# Patient Record
Sex: Female | Born: 1955
Health system: Southern US, Community
[De-identification: ages and names within clinical notes are randomized; demographics above are authoritative.]

## PROBLEM LIST (undated history)

## (undated) DIAGNOSIS — E7800 Pure hypercholesterolemia, unspecified: Secondary | ICD-10-CM

## (undated) DIAGNOSIS — N186 End stage renal disease: Secondary | ICD-10-CM

## (undated) DIAGNOSIS — I4891 Unspecified atrial fibrillation: Secondary | ICD-10-CM

## (undated) DIAGNOSIS — Z9114 Patient's other noncompliance with medication regimen: Secondary | ICD-10-CM

## (undated) DIAGNOSIS — I10 Essential (primary) hypertension: Secondary | ICD-10-CM

## (undated) DIAGNOSIS — N189 Chronic kidney disease, unspecified: Secondary | ICD-10-CM

## (undated) DIAGNOSIS — Z91148 Patient's other noncompliance with medication regimen for other reason: Secondary | ICD-10-CM

## (undated) HISTORY — DX: Chronic kidney disease, unspecified: N18.9

---

## 2000-01-25 ENCOUNTER — Emergency Department (HOSPITAL_COMMUNITY): Admission: EM | Admit: 2000-01-25 | Discharge: 2000-01-25 | Payer: Self-pay | Admitting: *Deleted

## 2000-01-25 ENCOUNTER — Encounter: Payer: Self-pay | Admitting: Emergency Medicine

## 2000-10-11 ENCOUNTER — Encounter: Payer: Self-pay | Admitting: Emergency Medicine

## 2000-10-11 ENCOUNTER — Emergency Department (HOSPITAL_COMMUNITY): Admission: EM | Admit: 2000-10-11 | Discharge: 2000-10-11 | Payer: Self-pay | Admitting: Emergency Medicine

## 2001-04-30 ENCOUNTER — Ambulatory Visit (HOSPITAL_COMMUNITY): Admission: RE | Admit: 2001-04-30 | Discharge: 2001-04-30 | Payer: Self-pay | Admitting: Internal Medicine

## 2001-04-30 ENCOUNTER — Encounter: Payer: Self-pay | Admitting: Internal Medicine

## 2009-08-02 ENCOUNTER — Emergency Department (HOSPITAL_COMMUNITY): Admission: EM | Admit: 2009-08-02 | Discharge: 2009-08-02 | Payer: Self-pay | Admitting: Emergency Medicine

## 2011-01-15 ENCOUNTER — Encounter: Payer: Self-pay | Admitting: Internal Medicine

## 2011-04-01 LAB — COMPREHENSIVE METABOLIC PANEL
ALT: 20 U/L (ref 0–35)
AST: 22 U/L (ref 0–37)
CO2: 26 mEq/L (ref 19–32)
Chloride: 109 mEq/L (ref 96–112)
Creatinine, Ser: 0.92 mg/dL (ref 0.4–1.2)
GFR calc Af Amer: >60 mL/min (ref >60)
GFR calc non Af Amer: >60 mL/min (ref >60)
Glucose, Bld: 96 mg/dL (ref 70–99)
Total Bilirubin: 0.6 mg/dL (ref 0.3–1.2)

## 2011-04-01 LAB — URINE MICROSCOPIC-ADD ON

## 2011-04-01 LAB — URINALYSIS, ROUTINE W REFLEX MICROSCOPIC
Bilirubin Urine: NEGATIVE
Glucose, UA: NEGATIVE mg/dL
Ketones, ur: NEGATIVE mg/dL
Leukocytes, UA: NEGATIVE
Nitrite: NEGATIVE
Protein, ur: 30 mg/dL — AB
Specific Gravity, Urine: 1.01 (ref 1.005–1.030)
Urobilinogen, UA: 0.2 mg/dL (ref 0.0–1.0)
pH: 7 (ref 5.0–8.0)

## 2011-04-01 LAB — CBC
Hemoglobin: 12.8 g/dL (ref 12.0–15.0)
MCV: 86.2 fL (ref 78.0–100.0)
RBC: 4.43 MIL/uL (ref 3.87–5.11)
WBC: 10.9 10*3/uL — ABNORMAL HIGH (ref 4.0–10.5)

## 2011-04-01 LAB — DIFFERENTIAL
Basophils Absolute: 0 10*3/uL (ref 0.0–0.1)
Eosinophils Absolute: 0.3 10*3/uL (ref 0.0–0.7)
Eosinophils Relative: 3 % (ref 0–5)
Lymphocytes Relative: 32 % (ref 12–46)
Neutrophils Relative %: 60 % (ref 43–77)

## 2013-06-05 ENCOUNTER — Encounter (HOSPITAL_COMMUNITY): Payer: Self-pay | Admitting: Emergency Medicine

## 2013-06-05 ENCOUNTER — Emergency Department (HOSPITAL_COMMUNITY)
Admission: EM | Admit: 2013-06-05 | Discharge: 2013-06-06 | Disposition: A | Discharge status: Home / Self Care | Payer: 59 | Attending: Emergency Medicine | Admitting: Emergency Medicine

## 2013-06-05 ENCOUNTER — Emergency Department (HOSPITAL_COMMUNITY): Payer: 59

## 2013-06-05 DIAGNOSIS — R0789 Other chest pain: Secondary | ICD-10-CM

## 2013-06-05 DIAGNOSIS — I10 Essential (primary) hypertension: Secondary | ICD-10-CM | POA: Insufficient documentation

## 2013-06-05 DIAGNOSIS — R071 Chest pain on breathing: Secondary | ICD-10-CM | POA: Insufficient documentation

## 2013-06-05 DIAGNOSIS — R609 Edema, unspecified: Secondary | ICD-10-CM | POA: Insufficient documentation

## 2013-06-05 DIAGNOSIS — Z862 Personal history of diseases of the blood and blood-forming organs and certain disorders involving the immune mechanism: Secondary | ICD-10-CM | POA: Insufficient documentation

## 2013-06-05 DIAGNOSIS — Z8639 Personal history of other endocrine, nutritional and metabolic disease: Secondary | ICD-10-CM | POA: Insufficient documentation

## 2013-06-05 DIAGNOSIS — R55 Syncope and collapse: Secondary | ICD-10-CM | POA: Insufficient documentation

## 2013-06-05 HISTORY — DX: Essential (primary) hypertension: I10

## 2013-06-05 HISTORY — DX: Pure hypercholesterolemia, unspecified: E78.00

## 2013-06-05 LAB — CBC WITH DIFFERENTIAL/PLATELET
Basophils Absolute: 0 10*3/uL (ref 0.0–0.1)
Lymphocytes Relative: 37 % (ref 12–46)
Neutro Abs: 5.1 10*3/uL (ref 1.7–7.7)
Platelets: 247 10*3/uL (ref 150–400)
RDW: 14.1 % (ref 11.5–15.5)
WBC: 9.7 10*3/uL (ref 4.0–10.5)

## 2013-06-05 LAB — BASIC METABOLIC PANEL
CO2: 29 mEq/L (ref 19–32)
Chloride: 106 mEq/L (ref 96–112)
Potassium: 3.7 mEq/L (ref 3.5–5.1)
Sodium: 143 mEq/L (ref 135–145)

## 2013-06-05 LAB — TROPONIN I: Troponin I: <0.3 ng/mL (ref <0.30)

## 2013-06-05 MED ORDER — ASPIRIN 81 MG PO CHEW
324.0000 mg | CHEWABLE_TABLET | Freq: Once | ORAL | Status: AC
  Administered 2013-06-05: 324 mg via ORAL
  Filled 2013-06-05: qty 4

## 2013-06-05 MED ORDER — HYDROCHLOROTHIAZIDE 12.5 MG PO TABS: 12.5000 mg | ORAL_TABLET | Freq: Every day | ORAL | Status: DC

## 2013-06-05 MED ORDER — CARBAMAZEPINE 200 MG PO TABS: 400.0000 mg | ORAL_TABLET | Freq: Once | ORAL | Status: DC

## 2013-06-05 MED ORDER — NAPROXEN 500 MG PO TABS: 500.0000 mg | ORAL_TABLET | Freq: Two times a day (BID) | ORAL | Status: DC

## 2013-06-05 MED ORDER — NAPROXEN 250 MG PO TABS
500.0000 mg | ORAL_TABLET | Freq: Once | ORAL | Status: AC
  Administered 2013-06-05: 500 mg via ORAL
  Filled 2013-06-05: qty 2

## 2013-06-05 MED ORDER — LORAZEPAM 1 MG PO TABS: 2.0000 mg | ORAL_TABLET | Freq: Once | ORAL | Status: DC

## 2013-06-05 NOTE — ED Provider Notes (Signed)
History    This chart was scribed for Patricia Fuel, MD by Patricia Wu, ED scribe.  This patient was seen in room APA14/[facility]14 and the patient's care was started at 11:01 PM.   CSN: [id number]  Arrival date & time 06/05/13  2058       Chief Complaint  Patient presents with  . Chest Pain  . Near Syncope     The history is provided by the patient. No language interpreter was used.    HPI Comments: Patricia Wu is a 57 y.o. female with h/o HTN and hypercholesteremia who presents to the Emergency Department complaining of intermittent, left-sided, throbbing CP that began yesterday.  Pain is rated at a severity of 8/10 and occurs in episodes lasting about 5-10 minutes, and radiates intermittently into the right arm.  Pain comes on without apparent cause and is not worsened by anything.  She states that it is improved by walking.  She states that she took 2 Advil yesterday, with some relief.  She also took 1 aspirin yesterday.  She has not taken aspirin today.  Presently she denies pain and states her last episode was 4 hours ago.  She denies fever, chills, cough, diaphoresis, SOB, nausea, emesis, syncope, or any other associated symptoms.  She denies h/o similar symptoms.  Pt denies any other medical conditions except as listed above.  She does not smoke.  She denies family h/o heart problems although she notes that her 59 year old brother recently had a stroke.  Pt notes that she was taking prednival for her BP but stopped because it made her sick.     Past Medical History  Diagnosis Date  . Hypertension   . Hypercholesteremia     History reviewed. No pertinent past surgical history.  History reviewed. No pertinent family history.  History  Substance Use Topics  . Smoking status: Never Smoker   . Smokeless tobacco: Not on file  . Alcohol Use: No    OB History   Grav Para Term Preterm Abortions TAB SAB Ect Mult Living                  Review of Systems  All other  systems reviewed and are negative.    Allergies  Review of patient's allergies indicates no known allergies.  Home Medications  No current outpatient prescriptions on file.  BP 190/103  Pulse 90  Temp(Src) 98 F (36.7 C) (Oral)  Resp 16  Ht 5\' 3"  (1.6 m)  Wt 155 lb (70.308 kg)  BMI 27.46 kg/m2  SpO2 100%  Physical Exam  Nursing note and vitals reviewed. Constitutional: She appears well-developed and well-nourished. No distress.  HENT:  Head: Normocephalic and atraumatic.  Eyes: EOM are normal. Pupils are equal, round, and reactive to light.  Neck: Normal range of motion. Neck supple. No tracheal deviation present.  Cardiovascular: Normal rate, regular rhythm and normal heart sounds.   No murmur heard. Pulmonary/Chest: Effort normal and breath sounds normal. No respiratory distress. She has no wheezes. She has no rales. She exhibits tenderness (Mild, left upper parasternal area - reproduces her pain).  Abdominal: Soft. Bowel sounds are normal. There is no tenderness.  Musculoskeletal: Normal range of motion. She exhibits edema (Trace pretibial edema).  Neurological: She is alert.  Skin: Skin is warm and dry.  Psychiatric: She has a normal mood and affect. Her behavior is normal.    ED Course  Procedures (including critical care time)  DIAGNOSTIC STUDIES: Oxygen Saturation is 100%  on room air, normal by my interpretation.    COORDINATION OF CARE: 11:08 PM-Informed pt that symptoms are most likely muscular.  Discussed treatment plan which includes CXR, anti-inflammatories and watchfulness for development of new symptoms due to pt's cardiac risk factors.  Pt expressed understanding and agreed to plan.    Results for orders placed during the hospital encounter of 06/05/13  CBC WITH DIFFERENTIAL      Result Value Range   WBC 9.7  4.0 - 10.5 K/uL   RBC 4.58  3.87 - 5.11 MIL/uL   Hemoglobin 13.0  12.0 - 15.0 g/dL   HCT 40.3  36.0 - 46.0 %   MCV 88.0  78.0 - 100.0 fL    MCH 28.4  26.0 - 34.0 pg   MCHC 32.3  30.0 - 36.0 g/dL   RDW 14.1  11.5 - 15.5 %   Platelets 247  150 - 400 K/uL   Neutrophils Relative % 53  43 - 77 %   Neutro Abs 5.1  1.7 - 7.7 K/uL   Lymphocytes Relative 37  12 - 46 %   Lymphs Abs 3.6  0.7 - 4.0 K/uL   Monocytes Relative 7  3 - 12 %   Monocytes Absolute 0.6  0.1 - 1.0 K/uL   Eosinophils Relative 3  0 - 5 %   Eosinophils Absolute 0.3  0.0 - 0.7 K/uL   Basophils Relative 0  0 - 1 %   Basophils Absolute 0.0  0.0 - 0.1 K/uL  BASIC METABOLIC PANEL      Result Value Range   Sodium 143  135 - 145 mEq/L   Potassium 3.7  3.5 - 5.1 mEq/L   Chloride 106  96 - 112 mEq/L   CO2 29  19 - 32 mEq/L   Glucose, Bld 100 (*) 70 - 99 mg/dL   BUN 16  6 - 23 mg/dL   Creatinine, Ser 1.13 (*) 0.50 - 1.10 mg/dL   Calcium 9.5  8.4 - 10.5 mg/dL   GFR calc non Af Amer 53 (*) >90 mL/min   GFR calc Af Amer 62 (*) >90 mL/min  TROPONIN I      Result Value Range   Troponin I <0.30  <0.30 ng/mL   Dg Chest 2 View  06/05/2013   *RADIOLOGY REPORT*  Clinical Data: Chest pain, near-syncope.  CHEST - 2 VIEW  Comparison: None.  Findings: Mild cardiomegaly.  No effusion.  Perihilar and bibasilar interstitial prominence and some thickening of the interlobar fissures suggesting mild interstitial edema.  Regional bones unremarkable.  IMPRESSION:  1.  Cardiomegaly with suspicion of mild interstitial edema.   Original Report Authenticated By: D. Wallace Going, MD    Date: 06/06/2013  Rate: 86  Rhythm: normal sinus rhythm  QRS Axis: normal  Intervals: normal  ST/T Wave abnormalities: nonspecific T wave changes  Conduction Disutrbances:none  Narrative Interpretation: Nonspecific T wave changes. When compared with ECG of 10/11/2000, no significant changes are seen.  Old EKG Reviewed: unchanged    1. Chest wall pain   2. Hypertension       MDM  Chest pain which seems consistent with chest wall pain. She will be treated with NSAIDs and prescriptions given for  naproxen. Also, she has significant hypertension with medication noncompliance having stopped her medication on her own. She states that she had intolerable side effects to lisinopril and she does have some peripheral edema. Accordingly, she will be given a prescription for hydrochlorothiazide and she is to  followup with her PCP. She is to monitor her blood pressure at home.   I personally performed the services described in this documentation, which was scribed in my presence. The recorded information has been reviewed and is accurate.   Patricia Fuel, MD [id number] [phone]

## 2013-06-05 NOTE — ED Notes (Signed)
Pt brought back by wheelchair, eyes closed but fluttering, appears to be passed out.  When pt informed we needed her to get up onto the stretcher, pt appears to act unconscious until encouraged to get up to the stretcher.

## 2014-05-05 ENCOUNTER — Ambulatory Visit (INDEPENDENT_AMBULATORY_CARE_PROVIDER_SITE_OTHER): Payer: 59 | Admitting: Family Medicine

## 2014-05-05 VITALS — BP 160/110 | HR 94 | Temp 97.9°F | Resp 18 | Ht 61.0 in | Wt 216.0 lb

## 2014-05-05 DIAGNOSIS — R05 Cough: Secondary | ICD-10-CM

## 2014-05-05 DIAGNOSIS — R059 Cough, unspecified: Secondary | ICD-10-CM

## 2014-05-05 DIAGNOSIS — R062 Wheezing: Secondary | ICD-10-CM

## 2014-05-05 DIAGNOSIS — I10 Essential (primary) hypertension: Secondary | ICD-10-CM

## 2014-05-05 LAB — POCT CBC
GRANULOCYTE PERCENT: 57.9 % (ref 37–80)
HCT, POC: 40.1 % (ref 37.7–47.9)
HEMOGLOBIN: 12.6 g/dL (ref 12.2–16.2)
Lymph, poc: 4.2 — AB (ref 0.6–3.4)
MCH, POC: 27.7 pg (ref 27–31.2)
MCHC: 31.4 g/dL — AB (ref 31.8–35.4)
MCV: 88.2 fL (ref 80–97)
MID (CBC): 0.9 (ref 0–0.9)
MPV: 8.9 fL (ref 0–99.8)
PLATELET COUNT, POC: 357 10*3/uL (ref 142–424)
POC Granulocyte: 7.1 — AB (ref 2–6.9)
POC LYMPH PERCENT: 34.4 %L (ref 10–50)
POC MID %: 7.7 % (ref 0–12)
RBC: 4.55 M/uL (ref 4.04–5.48)
RDW, POC: 15.1 %
WBC: 12.2 10*3/uL — AB (ref 4.6–10.2)

## 2014-05-05 LAB — COMPREHENSIVE METABOLIC PANEL
ALBUMIN: 4.1 g/dL (ref 3.5–5.2)
ALK PHOS: 155 U/L — AB (ref 39–117)
ALT: 21 U/L (ref 0–35)
AST: 17 U/L (ref 0–37)
BUN: 16 mg/dL (ref 6–23)
CO2: 25 mEq/L (ref 19–32)
CREATININE: 1.14 mg/dL — AB (ref 0.50–1.10)
Calcium: 9.5 mg/dL (ref 8.4–10.5)
Chloride: 108 mEq/L (ref 96–112)
GLUCOSE: 98 mg/dL (ref 70–99)
POTASSIUM: 4.4 meq/L (ref 3.5–5.3)
Sodium: 144 mEq/L (ref 135–145)
Total Bilirubin: 0.4 mg/dL (ref 0.2–1.2)
Total Protein: 7.5 g/dL (ref 6.0–8.3)

## 2014-05-05 LAB — LIPID PANEL
CHOL/HDL RATIO: 5.4 ratio
Cholesterol: 247 mg/dL — ABNORMAL HIGH (ref 0–200)
HDL: 46 mg/dL (ref >39)
LDL CALC: 171 mg/dL — AB (ref 0–99)
TRIGLYCERIDES: 152 mg/dL — AB (ref <150)
VLDL: 30 mg/dL (ref 0–40)

## 2014-05-05 MED ORDER — LISINOPRIL 20 MG PO TABS: 20.0000 mg | ORAL_TABLET | Freq: Every day | ORAL | Status: DC

## 2014-05-05 MED ORDER — PREDNISONE 20 MG PO TABS: ORAL_TABLET | ORAL | Status: DC

## 2014-05-05 MED ORDER — BENZONATATE 100 MG PO CAPS: 100.0000 mg | ORAL_CAPSULE | Freq: Three times a day (TID) | ORAL | Status: DC | PRN

## 2014-05-05 MED ORDER — ALBUTEROL SULFATE (2.5 MG/3ML) 0.083% IN NEBU
2.5000 mg | INHALATION_SOLUTION | Freq: Once | RESPIRATORY_TRACT | Status: AC
  Administered 2014-05-05: 2.5 mg via RESPIRATORY_TRACT

## 2014-05-05 MED ORDER — DOXYCYCLINE HYCLATE 100 MG PO CAPS: 100.0000 mg | ORAL_CAPSULE | Freq: Two times a day (BID) | ORAL | Status: DC

## 2014-05-05 MED ORDER — ALBUTEROL SULFATE HFA 108 (90 BASE) MCG/ACT IN AERS: 2.0000 | INHALATION_SPRAY | Freq: Four times a day (QID) | RESPIRATORY_TRACT | Status: DC | PRN

## 2014-05-05 NOTE — Progress Notes (Signed)
[facility] [address] [place] [address] [phone]  Date:  05/05/2014   Name:  Patricia Wu   DOB:  [birth year]   MRN:  [id number]  PCP:  Rosita Fire, MD    Chief Complaint: Wheezing and Nasal Congestion   History of Present Illness:  Patricia Wu is a 58 y.o. very pleasant female patient who presents with the following:  Here today with illness.  History of HTN and high cholesterol.  HCTZ 12.5 on her med list- we have not seen her here in some time so no recent BP here, however at the ED last year her BP was 190/103.  She is here today with congestion and "a little wheezing" for about one week.   She is coughing some- she took some robitussin and has been able to have a more productive cough here recently.  She has felt a bit warm, occasional chills but no aches. She is not having runny or stuffy nose, occasional sneezing.   She has never been dx with asthma, has not had wheezing in the past.  She is not aware of any new exposures that she might be allergic to.  She is a never smoker.    She stopped her BP medication about 2 years ago because it "was making me sick;" she noted frequent urination and dizziness.  She has no history of MI or stroke, has not noted any CP.   There are no active problems to display for this patient.   Past Medical History  Diagnosis Date  . Hypertension   . Hypercholesteremia     History reviewed. No pertinent past surgical history.  History  Substance Use Topics  . Smoking status: Never Smoker   . Smokeless tobacco: Not on file  . Alcohol Use: No    History reviewed. No pertinent family history.  No Known Allergies  Medication list has been reviewed and updated.  Current Outpatient Prescriptions on File Prior to Visit  Medication Sig Dispense Refill  . hydrochlorothiazide (HYDRODIURIL) 12.5 MG tablet Take 1 tablet (12.5 mg total) by mouth daily.  30 tablet  0  . naproxen (NAPROSYN) 500 MG tablet  Take 1 tablet (500 mg total) by mouth 2 (two) times daily.  30 tablet  0   No current facility-administered medications on file prior to visit.    Review of Systems:  As per HPI- otherwise negative.   Physical Examination: Filed Vitals:   05/05/14 0826  BP: 162/112  Pulse: 94  Temp: 97.9 F (36.6 C)  Resp: 18   Filed Vitals:   05/05/14 0826  Height: 5\' 1"  (1.549 m)  Weight: 216 lb (97.977 kg)   Body mass index is 40.83 kg/(m^2). Ideal Body Weight: Weight in (lb) to have BMI = 25: 132  GEN: WDWN, NAD, Non-toxic, A & O x 3, obese, audible wheezing HEENT: Atraumatic, Normocephalic. Neck supple. No masses, No LAD. Ears and Nose: No external deformity. CV: RRR, No M/G/R. No JVD. No thrill. No extra heart sounds. PULM: CTA B, no crackles, rhonchi. No retractions. No resp. distress. No accessory muscle use. Diffuse wheezes bilaterally ABD: S, NT, ND EXTR: No c/c/e NEURO Normal gait.  PSYCH: Normally interactive. Conversant. Not depressed or anxious appearing.  Calm demeanor.   Treated with albuterol neb tx: wheezing somewhat improved, she felt better  Results for orders placed in visit on 05/05/14  POCT CBC      Result Value Ref Range   WBC 12.2 (*)  4.6 - 10.2 K/uL   Lymph, poc 4.2 (*) 0.6 - 3.4   POC LYMPH PERCENT 34.4  10 - 50 %L   MID (cbc) 0.9  0 - 0.9   POC MID % 7.7  0 - 12 %M   POC Granulocyte 7.1 (*) 2 - 6.9   Granulocyte percent 57.9  37 - 80 %G   RBC 4.55  4.04 - 5.48 M/uL   Hemoglobin 12.6  12.2 - 16.2 g/dL   HCT, POC 40.1  37.7 - 47.9 %   MCV 88.2  80 - 97 fL   MCH, POC 27.7  27 - 31.2 pg   MCHC 31.4 (*) 31.8 - 35.4 g/dL   RDW, POC 15.1     Platelet Count, POC 357  142 - 424 K/uL   MPV 8.9  0 - 99.8 fL    Assessment and Plan: HTN (hypertension) - Plan: Comprehensive metabolic panel, Lipid panel, lisinopril (PRINIVIL,ZESTRIL) 20 MG tablet  Wheezing - Plan: albuterol (PROVENTIL) (2.5 MG/3ML) 0.083% nebulizer solution 2.5 mg, albuterol (PROVENTIL  HFA;VENTOLIN HFA) 108 (90 BASE) MCG/ACT inhaler, predniSONE (DELTASONE) 20 MG tablet  Cough - Plan: POCT CBC, benzonatate (TESSALON) 100 MG capsule, albuterol (PROVENTIL HFA;VENTOLIN HFA) 108 (90 BASE) MCG/ACT inhaler, doxycycline (VIBRAMYCIN) 100 MG capsule  Restart treatment for HTN.  Treat for bronchitis and wheezing with prednisone and doxyc. Albuterol prn See patient instructions for more details.     Signed Lamar Blinks, MD

## 2014-05-05 NOTE — Patient Instructions (Signed)
Purchase some OTC claritin or zyrtec and take one a day Use the albuterol inhaler as needed for wheezing Take the prednisone and doxycycline (antibiotic) as directed Use the tessalon as needed for cough- also ok to take delsym or plain mucinex  Start on 10 mg (1/2 pill) of the lisinopril for about 5 days, and check your blood pressure at the drug store a couple of times.  Then give me a call and we can determine if we need to go up to 20 mg

## 2014-05-06 ENCOUNTER — Encounter: Payer: Self-pay | Admitting: Family Medicine

## 2015-11-09 ENCOUNTER — Emergency Department (HOSPITAL_COMMUNITY)
Admission: EM | Admit: 2015-11-09 | Discharge: 2015-11-09 | Disposition: A | Discharge status: Home / Self Care | Payer: Commercial Managed Care - HMO | Attending: Emergency Medicine | Admitting: Emergency Medicine

## 2015-11-09 ENCOUNTER — Encounter (HOSPITAL_COMMUNITY): Payer: Self-pay | Admitting: Emergency Medicine

## 2015-11-09 DIAGNOSIS — Z8639 Personal history of other endocrine, nutritional and metabolic disease: Secondary | ICD-10-CM | POA: Insufficient documentation

## 2015-11-09 DIAGNOSIS — R110 Nausea: Secondary | ICD-10-CM | POA: Insufficient documentation

## 2015-11-09 DIAGNOSIS — I159 Secondary hypertension, unspecified: Secondary | ICD-10-CM | POA: Diagnosis not present

## 2015-11-09 DIAGNOSIS — R059 Cough, unspecified: Secondary | ICD-10-CM

## 2015-11-09 DIAGNOSIS — R05 Cough: Secondary | ICD-10-CM

## 2015-11-09 DIAGNOSIS — I10 Essential (primary) hypertension: Secondary | ICD-10-CM | POA: Diagnosis present

## 2015-11-09 DIAGNOSIS — R062 Wheezing: Secondary | ICD-10-CM

## 2015-11-09 DIAGNOSIS — Z9114 Patient's other noncompliance with medication regimen: Secondary | ICD-10-CM | POA: Diagnosis not present

## 2015-11-09 HISTORY — DX: Patient's other noncompliance with medication regimen for other reason: Z91.148

## 2015-11-09 HISTORY — DX: Patient's other noncompliance with medication regimen: Z91.14

## 2015-11-09 LAB — CBC
HEMATOCRIT: 37.3 % (ref 36.0–46.0)
HEMOGLOBIN: 11.8 g/dL — AB (ref 12.0–15.0)
MCH: 28.4 pg (ref 26.0–34.0)
MCHC: 31.6 g/dL (ref 30.0–36.0)
MCV: 89.7 fL (ref 78.0–100.0)
Platelets: 249 10*3/uL (ref 150–400)
RBC: 4.16 MIL/uL (ref 3.87–5.11)
RDW: 14.3 % (ref 11.5–15.5)
WBC: 9.4 10*3/uL (ref 4.0–10.5)

## 2015-11-09 LAB — BASIC METABOLIC PANEL
ANION GAP: 7 (ref 5–15)
BUN: 18 mg/dL (ref 6–20)
CHLORIDE: 111 mmol/L (ref 101–111)
CO2: 26 mmol/L (ref 22–32)
Calcium: 9.4 mg/dL (ref 8.9–10.3)
Creatinine, Ser: 1.47 mg/dL — ABNORMAL HIGH (ref 0.44–1.00)
GFR, EST AFRICAN AMERICAN: 44 mL/min — AB (ref >60)
GFR, EST NON AFRICAN AMERICAN: 38 mL/min — AB (ref >60)
Glucose, Bld: 95 mg/dL (ref 65–99)
POTASSIUM: 4.4 mmol/L (ref 3.5–5.1)
SODIUM: 144 mmol/L (ref 135–145)

## 2015-11-09 MED ORDER — CLONIDINE HCL 0.1 MG PO TABS
0.1000 mg | ORAL_TABLET | Freq: Once | ORAL | Status: AC
  Administered 2015-11-09: 0.1 mg via ORAL
  Filled 2015-11-09: qty 1

## 2015-11-09 MED ORDER — HYDROCHLOROTHIAZIDE 25 MG PO TABS
25.0000 mg | ORAL_TABLET | Freq: Every day | ORAL | Status: DC
  Administered 2015-11-09: 25 mg via ORAL
  Filled 2015-11-09: qty 1

## 2015-11-09 MED ORDER — HYDROCHLOROTHIAZIDE 25 MG PO TABS: 25.0000 mg | ORAL_TABLET | Freq: Every day | ORAL | Status: DC

## 2015-11-09 MED ORDER — ALBUTEROL SULFATE HFA 108 (90 BASE) MCG/ACT IN AERS: 2.0000 | INHALATION_SPRAY | Freq: Four times a day (QID) | RESPIRATORY_TRACT | Status: DC | PRN

## 2015-11-09 NOTE — ED Notes (Signed)
Patient sent here from Dr. Josephine Cables office due to hypertension of 210/100. Patient denies SOB, chest pain, N/V. NAD

## 2015-11-09 NOTE — Discharge Instructions (Signed)
Hypertension Hypertension, commonly called high blood pressure, is when the force of blood pumping through your arteries is too strong. Your arteries are the blood vessels that carry blood from your heart throughout your body. A blood pressure reading consists of a higher number over a lower number, such as 110/72. The higher number (systolic) is the pressure inside your arteries when your heart pumps. The lower number (diastolic) is the pressure inside your arteries when your heart relaxes. Ideally you want your blood pressure below 120/80. Hypertension forces your heart to work harder to pump blood. Your arteries may become narrow or stiff. Having untreated or uncontrolled hypertension can cause heart attack, stroke, kidney disease, and other problems. RISK FACTORS Some risk factors for high blood pressure are controllable. Others are not.  Risk factors you cannot control include:   Race. You may be at higher risk if you are African American.  Age. Risk increases with age.  Gender. Men are at higher risk than women before age 45 years. After age 65, women are at higher risk than men. Risk factors you can control include:  Not getting enough exercise or physical activity.  Being overweight.  Getting too much fat, sugar, calories, or salt in your diet.  Drinking too much alcohol. SIGNS AND SYMPTOMS Hypertension does not usually cause signs or symptoms. Extremely high blood pressure (hypertensive crisis) may cause headache, anxiety, shortness of breath, and nosebleed. DIAGNOSIS To check if you have hypertension, your health care provider will measure your blood pressure while you are seated, with your arm held at the level of your heart. It should be measured at least twice using the same arm. Certain conditions can cause a difference in blood pressure between your right and left arms. A blood pressure reading that is higher than normal on one occasion does not mean that you need treatment. If  it is not clear whether you have high blood pressure, you may be asked to return on a different day to have your blood pressure checked again. Or, you may be asked to monitor your blood pressure at home for 1 or more weeks. TREATMENT Treating high blood pressure includes making lifestyle changes and possibly taking medicine. Living a healthy lifestyle can help lower high blood pressure. You may need to change some of your habits. Lifestyle changes may include:  Following the DASH diet. This diet is high in fruits, vegetables, and whole grains. It is low in salt, red meat, and added sugars.  Keep your sodium intake below 2,300 mg per day.  Getting at least 30-45 minutes of aerobic exercise at least 4 times per week.  Losing weight if necessary.  Not smoking.  Limiting alcoholic beverages.  Learning ways to reduce stress. Your health care provider may prescribe medicine if lifestyle changes are not enough to get your blood pressure under control, and if one of the following is true:  You are 18-59 years of age and your systolic blood pressure is above 140.  You are 60 years of age or older, and your systolic blood pressure is above 150.  Your diastolic blood pressure is above 90.  You have diabetes, and your systolic blood pressure is over 140 or your diastolic blood pressure is over 90.  You have kidney disease and your blood pressure is above 140/90.  You have heart disease and your blood pressure is above 140/90. Your personal target blood pressure may vary depending on your medical conditions, your age, and other factors. HOME CARE INSTRUCTIONS    Have your blood pressure rechecked as directed by your health care provider.   Take medicines only as directed by your health care provider. Follow the directions carefully. Blood pressure medicines must be taken as prescribed. The medicine does not work as well when you skip doses. Skipping doses also puts you at risk for  problems.  Do not smoke.   Monitor your blood pressure at home as directed by your health care provider. SEEK MEDICAL CARE IF:   You think you are having a reaction to medicines taken.  You have recurrent headaches or feel dizzy.  You have swelling in your ankles.  You have trouble with your vision. SEEK IMMEDIATE MEDICAL CARE IF:  You develop a severe headache or confusion.  You have unusual weakness, numbness, or feel faint.  You have severe chest or abdominal pain.  You vomit repeatedly.  You have trouble breathing. MAKE SURE YOU:   Understand these instructions.  Will watch your condition.  Will get help right away if you are not doing well or get worse.   This information is not intended to replace advice given to you by your health care provider. Make sure you discuss any questions you have with your health care provider.   Document Released: 12/10/2005 Document Revised: 04/26/2015 Document Reviewed: 10/02/2013 Elsevier Interactive Patient Education 2016 [employer].  How to Take Your Blood Pressure HOW DO I GET A BLOOD PRESSURE MACHINE?  You can buy an electronic home blood pressure machine at your local pharmacy. Insurance will sometimes cover the cost if you have a prescription.  Ask your doctor what type of machine is best for you. There are different machines for your arm and your wrist.  If you decide to buy a machine to check your blood pressure on your arm, first check the size of your arm so you can buy the right size cuff. To check the size of your arm:   Use a measuring tape that shows both inches and centimeters.   Wrap the measuring tape around the upper-middle part of your arm. You may need someone to help you measure.   Write down your arm measurement in both inches and centimeters.   To measure your blood pressure correctly, it is important to have the right size cuff.   If your arm is up to 13 inches (up to 34 centimeters), get an  adult cuff size.  If your arm is 13 to 17 inches (35 to 44 centimeters), get a large adult cuff size.    If your arm is 17 to 20 inches (45 to 52 centimeters), get an adult thigh cuff.  WHAT DO THE NUMBERS MEAN?   There are two numbers that make up your blood pressure. For example: 120/80.  The first number (120 in our example) is called the "systolic pressure." It is a measure of the pressure in your blood vessels when your heart is pumping blood.  The second number (80 in our example) is called the "diastolic pressure." It is a measure of the pressure in your blood vessels when your heart is resting between beats.  Your doctor will tell you what your blood pressure should be. WHAT SHOULD I DO BEFORE I CHECK MY BLOOD PRESSURE?   Try to rest or relax for at least 30 minutes before you check your blood pressure.  Do not smoke.  Do not have any drinks with caffeine, such as:  Soda.  Coffee.  Tea.  Check your blood pressure in a  quiet room.  Sit down and stretch out your arm on a table. Keep your arm at about the level of your heart. Let your arm relax.  Make sure that your legs are not crossed. HOW DO I CHECK MY BLOOD PRESSURE?  Follow the directions that came with your machine.  Make sure you remove any tight-fitting clothing from your arm or wrist. Wrap the cuff around your upper arm or wrist. You should be able to fit a finger between the cuff and your arm. If you cannot fit a finger between the cuff and your arm, it is too tight and should be removed and rewrapped.  Some units require you to manually pump up the arm cuff.  Automatic units inflate the cuff when you press a button.  Cuff deflation is automatic in both models.  After the cuff is inflated, the unit measures your blood pressure and pulse. The readings are shown on a monitor. Hold still and breathe normally while the cuff is inflated.  Getting a reading takes less than a minute.  Some models store  readings in a memory. Some provide a printout of readings. If your machine does not store your readings, keep a written record.  Take readings with you to your next visit with your doctor.   This information is not intended to replace advice given to you by your health care provider. Make sure you discuss any questions you have with your health care provider.   Document Released: 11/22/2008 Document Revised: 12/31/2014 Document Reviewed: 02/04/2014 Elsevier Interactive Patient Education [employer].

## 2015-11-09 NOTE — ED Provider Notes (Signed)
CSN: [id number]     Arrival date & time 11/09/15  1223 History  By signing my name below, I, Starleen Arms, attest that this documentation has been prepared under the direction and in the presence of Jola Schmidt, MD. Electronically Signed: Starleen Arms, ED Scribe. 11/09/2015. 12:49 PM.    Chief Complaint  Patient presents with  . Hypertension   The history is provided by the patient. No language interpreter was used.   HPI Comments: Patricia Wu is a 59 y.o. female who was sent from her PCP Dr. Josephine Cables office for HTN.  The patient reports she was being seen by Dr. Legrand Rams after being required by her new employer to undergo a medical screening and to have her HTN controlled.  She was previously prescribed lisinopril with compliance for a period of time before she stopped taking the medication due to nausea.  She has also taken HCTZ at some point in the past.  She denies current symptoms.   Past Medical History  Diagnosis Date  . Hypertension   . Hypercholesteremia   . Noncompliance with medication regimen    History reviewed. No pertinent past surgical history. No family history on file. Social History  Substance Use Topics  . Smoking status: Never Smoker   . Smokeless tobacco: None  . Alcohol Use: No   OB History    No data available     Review of Systems A complete 10 system review of systems was obtained and all systems are negative except as noted in the HPI and PMH.   Allergies  Review of patient's allergies indicates no known allergies.  Home Medications   Prior to Admission medications   Medication Sig Start Date End Date Taking? Authorizing Provider  albuterol (PROVENTIL HFA;VENTOLIN HFA) 108 (90 BASE) MCG/ACT inhaler Inhale 2 puffs into the lungs every 6 (six) hours as needed for wheezing or shortness of breath. 11/09/15   Jola Schmidt, MD  hydrochlorothiazide (HYDRODIURIL) 25 MG tablet Take 1 tablet (25 mg total) by mouth daily. 11/09/15   Jola Schmidt, MD   BP  210/127 mmHg  Pulse 77  Temp(Src) 98 F (36.7 C) (Oral)  Resp 18  Ht 5\' 4"  (1.626 m)  Wt 219 lb (99.338 kg)  BMI 37.57 kg/m2  SpO2 100% Physical Exam  Constitutional: She is oriented to person, place, and time. She appears well-developed and well-nourished. No distress.  HENT:  Head: Normocephalic and atraumatic.  Eyes: EOM are normal.  Neck: Normal range of motion.  Cardiovascular: Normal rate, regular rhythm and normal heart sounds.   Pulmonary/Chest: Effort normal and breath sounds normal.  Abdominal: Soft. She exhibits no distension. There is no tenderness.  Musculoskeletal: Normal range of motion.  Neurological: She is alert and oriented to person, place, and time.  Skin: Skin is warm and dry.  Psychiatric: She has a normal mood and affect. Judgment normal.  Nursing note and vitals reviewed.   ED Course  Procedures (including critical care time)  DIAGNOSTIC STUDIES: Oxygen Saturation is 99% on RA, normal by my interpretation.    COORDINATION OF CARE:  12:49 PM Discussed treatment plan with patient at bedside.  Patient acknowledges and agrees with plan.    Labs Review Labs Reviewed  BASIC METABOLIC PANEL - Abnormal; Notable for the following:    Creatinine, Ser 1.47 (*)    GFR calc non Af Amer 38 (*)    GFR calc Af Amer 44 (*)    All other components within normal limits  CBC -  Abnormal; Notable for the following:    Hemoglobin 11.8 (*)    All other components within normal limits    Imaging Review No results found. I have personally reviewed and evaluated these images and lab results as part of my medical decision-making.   EKG Interpretation   Date/Time:  Wednesday November 09 2015 12:47:19 EST Ventricular Rate:  96 PR Interval:  173 QRS Duration: 93 QT Interval:  378 QTC Calculation: 478 R Axis:   31 Text Interpretation:  Sinus rhythm LAE, consider biatrial enlargement  Probable left ventricular hypertrophy Baseline wander in lead(s) V1 V3 V6  No  significant change was found Confirmed by Kajal Scalici  MD, Lennette Bihari ([id number]) on  11/09/2015 2:13:54 PM      MDM   Final diagnoses:  Secondary hypertension, unspecified  H/O medication noncompliance    Asymptomatic HTN with medication on compliance. Long discussion about diet and exercise and lifestyle changes. Will be started on HCTZ. Recommend home BP monitoring. Dc home in good condition  I personally performed the services described in this documentation, which was scribed in my presence. The recorded information has been reviewed and is accurate.       Jola Schmidt, MD 11/09/15 [phone]

## 2017-01-07 DIAGNOSIS — Z23 Encounter for immunization: Secondary | ICD-10-CM | POA: Diagnosis not present

## 2017-01-07 DIAGNOSIS — E784 Other hyperlipidemia: Secondary | ICD-10-CM | POA: Diagnosis not present

## 2017-01-07 DIAGNOSIS — Z0001 Encounter for general adult medical examination with abnormal findings: Secondary | ICD-10-CM | POA: Diagnosis not present

## 2017-01-08 ENCOUNTER — Other Ambulatory Visit (HOSPITAL_COMMUNITY): Payer: Self-pay | Admitting: Internal Medicine

## 2017-01-08 DIAGNOSIS — Z1231 Encounter for screening mammogram for malignant neoplasm of breast: Secondary | ICD-10-CM

## 2017-01-16 ENCOUNTER — Ambulatory Visit (HOSPITAL_COMMUNITY): Payer: Commercial Managed Care - HMO

## 2018-02-01 ENCOUNTER — Other Ambulatory Visit: Payer: Self-pay

## 2018-02-01 ENCOUNTER — Encounter: Payer: Self-pay | Admitting: Family Medicine

## 2018-02-01 ENCOUNTER — Ambulatory Visit: Payer: 59 | Admitting: Family Medicine

## 2018-02-01 VITALS — BP 199/126 | HR 96 | Temp 97.9°F | Ht 61.0 in | Wt 231.0 lb

## 2018-02-01 DIAGNOSIS — R062 Wheezing: Secondary | ICD-10-CM | POA: Diagnosis not present

## 2018-02-01 DIAGNOSIS — I10 Essential (primary) hypertension: Secondary | ICD-10-CM

## 2018-02-01 DIAGNOSIS — Z23 Encounter for immunization: Secondary | ICD-10-CM

## 2018-02-01 DIAGNOSIS — Z114 Encounter for screening for human immunodeficiency virus [HIV]: Secondary | ICD-10-CM

## 2018-02-01 DIAGNOSIS — R0609 Other forms of dyspnea: Secondary | ICD-10-CM

## 2018-02-01 DIAGNOSIS — E785 Hyperlipidemia, unspecified: Secondary | ICD-10-CM | POA: Diagnosis not present

## 2018-02-01 DIAGNOSIS — Z1329 Encounter for screening for other suspected endocrine disorder: Secondary | ICD-10-CM | POA: Diagnosis not present

## 2018-02-01 DIAGNOSIS — Z1159 Encounter for screening for other viral diseases: Secondary | ICD-10-CM | POA: Diagnosis not present

## 2018-02-01 DIAGNOSIS — R0600 Dyspnea, unspecified: Secondary | ICD-10-CM

## 2018-02-01 MED ORDER — LISINOPRIL-HYDROCHLOROTHIAZIDE 20-12.5 MG PO TABS: 1.0000 | ORAL_TABLET | Freq: Every day | ORAL | 1 refills | Status: DC

## 2018-02-01 MED ORDER — AMLODIPINE BESYLATE 10 MG PO TABS: 10.0000 mg | ORAL_TABLET | Freq: Every day | ORAL | 1 refills | Status: DC

## 2018-02-01 MED ORDER — ALBUTEROL SULFATE HFA 108 (90 BASE) MCG/ACT IN AERS: 1.0000 | INHALATION_SPRAY | RESPIRATORY_TRACT | 0 refills | Status: DC | PRN

## 2018-02-01 MED ORDER — SIMVASTATIN 10 MG PO TABS: 10.0000 mg | ORAL_TABLET | Freq: Every day | ORAL | 1 refills | Status: DC

## 2018-02-01 NOTE — Patient Instructions (Addendum)
Restart your cholesterol med and most importantly your blood pressure meds as it is very high today. If any new headaches, cheat pains, weakness or other new symptoms be seen in the emergency room. Follow up for blood pressure in 1 week. We will request records form your doctor and try to perform a physical at that time.   Albuterol refilled, but if you need that more frequently than 1-2 times per week, will need to discuss other treatment. I will also check a heart failure test.  If any worsening shortness of breath, chest pain or tightness - be seen in the emergency room.   We can decide on mammogram and colonoscopy at next visit when we review records.   IF you received an x-ray today, you will receive an invoice from [facility]. Please contact [facility] at [phone] with questions or concerns regarding your invoice.   IF you received labwork today, you will receive an invoice from [facility]. Please contact [facility] at [phone] with questions or concerns regarding your invoice.   Our billing staff will not be able to assist you with questions regarding bills from these companies.  You will be contacted with the lab results as soon as they are available. The fastest way to get your results is to activate your My Chart account. Instructions are located on the last page of this paperwork. If you have not heard from [place] regarding the results in 2 weeks, please contact this office.

## 2018-02-01 NOTE — Progress Notes (Signed)
By signing my name below, I, Mayer Masker, attest that this documentation has been prepared under the direction and in the presence of Carlota Raspberry, Ranell Patrick, MD. Electronically Signed: Mayer Masker, Medical Scribe 02/01/2018 at 12:10 PM.  Subjective:    Patient ID: Patricia Wu, female    DOB: 12-Apr-1956, 62 y.o.   MRN: [id number]  HPI Chief Complaint  Patient presents with  . Annual Exam    ESTABLISHING CARE, ALSO WANT PAP. NEEDS BP MED REFILL AND INHALER   Patricia Wu is a 62 y.o. female who presents to Primary Care at [facility] for establishing care. She was previously followed by Dr. Lorelei Pont in 2015 for HTN and reactive airway disease/wheezing. Subsequently she was seeing Dr. Legrand Rams in [place], she was on amlodipine 10mg  QD and lisinopril-HCTZ combination QD. For HLD: she was taking simvastatin 10mg  QD. Those prescriptions were on July and Sept 2018. She was seen in the ER in 2016 for HTN. She was off medications at that time and restarted HCTZ.  HTN:  She was taking amlodipine 10mg  QD, as well as lisinopril-HCTZ combo, but she ran out of these medications and have not had them refilled for 3-4 months. She has been out of work and has lost her insurance, which is why she did not have them refilled. She was tolerating these medicines well. She drinks vinegar. She denies CP, SOB, HA, blurry vision, and weakness.  She has no h/o MI or stroke.  She works for a [employer]. She has not eaten anything yet today.   Immunizations: Tetanus and flu are not UTD.  Cancer screening: She has not had a mammogram done. Her last colonoscopy was "a while ago" and not UTD.  Reactive airway disease: She states sometimes has wheezing due to environmental or seasonal allergies. She used an inhaler last week with improvement to her wheezing. She also has some wheezing and SOB with exertion, when she is trying to put on her shoes for work.  Review of Systems    Constitutional: Negative for fatigue and unexpected weight change.  Eyes: Negative for visual disturbance.  Respiratory: Negative for chest tightness and shortness of breath.   Cardiovascular: Negative for chest pain, palpitations and leg swelling.  Gastrointestinal: Negative for abdominal pain and blood in stool.  Neurological: Negative for dizziness, syncope, weakness, light-headedness and headaches.       Objective:   Physical Exam  Constitutional: She is oriented to person, place, and time. She appears well-developed and well-nourished.  HENT:  Head: Normocephalic and atraumatic.  Eyes: Conjunctivae and EOM are normal. Pupils are equal, round, and reactive to light.  Neck: Carotid bruit is not present.  Cardiovascular: Normal rate, regular rhythm, normal heart sounds and intact distal pulses.  +1 pedal edema  Pulmonary/Chest: Effort normal and breath sounds normal.  Abdominal: Soft. She exhibits no pulsatile midline mass. There is no tenderness.  Neurological: She is alert and oriented to person, place, and time.  Skin: Skin is warm and dry.  Psychiatric: She has a normal mood and affect. Her behavior is normal.  Vitals reviewed.  Vitals:   02/01/18 1117  BP: (!) 199/126  Pulse: 96  Temp: 97.9 F (36.6 C)  TempSrc: Oral  SpO2: 99%  Weight: 231 lb (104.8 kg)  Height: 5\' 1"  (1.549 m)          Assessment & Plan:   Patricia Wu is a 62 y.o. female Hyperlipidemia, unspecified hyperlipidemia type - Plan: simvastatin (  ZOCOR) 10 MG tablet, Lipid panel, Comprehensive metabolic panel  - Restart Zocor, baseline lipid panel CMP obtained, will need to recheck after at least 6 weeks of treatment  Essential hypertension - Plan: lisinopril-hydrochlorothiazide (ZESTORETIC) 20-12.5 MG tablet, amLODipine (NORVASC) 10 MG tablet  -Uncontrolled off meds. Denies any symptoms at this time, nonfocal exam.  -Restart previous doses of amlodipine and Zestoretic, baseline labs as  above.  -Recheck 1 week, ER precautions if any symptoms prior to that time given current readings  Screening for thyroid disorder - Plan: TSH  DOE (dyspnea on exertion) - Plan: Pro b natriuretic peptide Wheezing - Plan: albuterol (PROVENTIL HFA;VENTOLIN HFA) 108 (90 Base) MCG/ACT inhaler, Pro b natriuretic peptide  -Description the symptoms sound to be bronchospasm, but with elevated BP and some symptoms and with exertion, CHF in differential. BMP obtained, RTC/ER precautions if any acute worsening of symptoms.  -Albuterol inhaler was provided if bronchospasm symptoms return in the interim. Side effects discussed  Screening for HIV (human immunodeficiency virus) - Plan: HIV antibody  Encounter for hepatitis C screening test for low risk patient - Plan: Hepatitis C antibody  Need for Tdap vaccination - Plan: Tdap vaccine greater than or equal to 7yo IM  Needs flu shot - Plan: Flu Vaccine QUAD 36+ mos IM  Deferred physical at this time due to issues above at present.   Meds ordered this encounter  Medications  . simvastatin (ZOCOR) 10 MG tablet    Sig: Take 1 tablet (10 mg total) by mouth daily.    Dispense:  30 tablet    Refill:  1  . lisinopril-hydrochlorothiazide (ZESTORETIC) 20-12.5 MG tablet    Sig: Take 1 tablet by mouth daily.    Dispense:  30 tablet    Refill:  1  . amLODipine (NORVASC) 10 MG tablet    Sig: Take 1 tablet (10 mg total) by mouth daily.    Dispense:  30 tablet    Refill:  1  . albuterol (PROVENTIL HFA;VENTOLIN HFA) 108 (90 Base) MCG/ACT inhaler    Sig: Inhale 1-2 puffs into the lungs every 4 (four) hours as needed for wheezing or shortness of breath.    Dispense:  1 Inhaler    Refill:  0   Patient Instructions   Restart your cholesterol med and most importantly your blood pressure meds as it is very high today. If any new headaches, cheat pains, weakness or other new symptoms be seen in the emergency room. Follow up for blood pressure in 1 week. We will  request records form your doctor and try to perform a physical at that time.   Albuterol refilled, but if you need that more frequently than 1-2 times per week, will need to discuss other treatment. I will also check a heart failure test.  If any worsening shortness of breath, chest pain or tightness - be seen in the emergency room.   We can decide on mammogram and colonoscopy at next visit when we review records.   IF you received an x-ray today, you will receive an invoice from [facility]. Please contact [facility] at [phone] with questions or concerns regarding your invoice.   IF you received labwork today, you will receive an invoice from [facility]. Please contact [facility] at [phone] with questions or concerns regarding your invoice.   Our billing staff will not be able to assist you with questions regarding bills from these companies.  You will be contacted with the lab results as soon as they are  available. The fastest way to get your results is to activate your My Chart account. Instructions are located on the last page of this paperwork. If you have not heard from [place] regarding the results in 2 weeks, please contact this office.      I personally performed the services described in this documentation, which was scribed in my presence. The recorded information has been reviewed and considered for accuracy and completeness, addended by me as needed, and agree with information above.  Signed,   Merri Ray, MD [facility] at [place].  02/02/18 9:11 PM

## 2018-02-02 LAB — COMPREHENSIVE METABOLIC PANEL
ALBUMIN: 4.2 g/dL (ref 3.6–4.8)
ALK PHOS: 191 IU/L — AB (ref 39–117)
ALT: 14 IU/L (ref 0–32)
AST: 13 IU/L (ref 0–40)
Albumin/Globulin Ratio: 1.3 (ref 1.2–2.2)
BUN / CREAT RATIO: 12 (ref 12–28)
BUN: 18 mg/dL (ref 8–27)
Bilirubin Total: 0.4 mg/dL (ref 0.0–1.2)
CALCIUM: 9.7 mg/dL (ref 8.7–10.3)
CO2: 21 mmol/L (ref 20–29)
CREATININE: 1.56 mg/dL — AB (ref 0.57–1.00)
Chloride: 107 mmol/L — ABNORMAL HIGH (ref 96–106)
GFR calc non Af Amer: 36 mL/min/{1.73_m2} — ABNORMAL LOW (ref >59)
GFR, EST AFRICAN AMERICAN: 41 mL/min/{1.73_m2} — AB (ref >59)
GLOBULIN, TOTAL: 3.2 g/dL (ref 1.5–4.5)
GLUCOSE: 92 mg/dL (ref 65–99)
Potassium: 4.3 mmol/L (ref 3.5–5.2)
SODIUM: 145 mmol/L — AB (ref 134–144)
TOTAL PROTEIN: 7.4 g/dL (ref 6.0–8.5)

## 2018-02-02 LAB — HEPATITIS C ANTIBODY: Hep C Virus Ab: <0.1 s/co ratio (ref 0.0–0.9)

## 2018-02-02 LAB — HIV ANTIBODY (ROUTINE TESTING W REFLEX): HIV Screen 4th Generation wRfx: NONREACTIVE

## 2018-02-02 LAB — LIPID PANEL
CHOLESTEROL TOTAL: 245 mg/dL — AB (ref 100–199)
Chol/HDL Ratio: 5 ratio — ABNORMAL HIGH (ref 0.0–4.4)
HDL: 49 mg/dL (ref >39)
LDL Calculated: 175 mg/dL — ABNORMAL HIGH (ref 0–99)
Triglycerides: 107 mg/dL (ref 0–149)
VLDL Cholesterol Cal: 21 mg/dL (ref 5–40)

## 2018-02-02 LAB — PRO B NATRIURETIC PEPTIDE: NT-PRO BNP: 939 pg/mL — AB (ref 0–287)

## 2018-02-02 LAB — TSH: TSH: 1.47 u[IU]/mL (ref 0.450–4.500)

## 2018-02-10 ENCOUNTER — Ambulatory Visit: Payer: 59 | Admitting: Family Medicine

## 2018-02-12 ENCOUNTER — Telehealth: Payer: Self-pay | Admitting: *Deleted

## 2018-02-12 ENCOUNTER — Encounter: Payer: Self-pay | Admitting: *Deleted

## 2018-02-12 NOTE — Telephone Encounter (Signed)
Dr. Carlota Raspberry,  I tried to contact patient twice.  The only number we have patient stated it was wrong number.   I have sent a letter.

## 2018-02-12 NOTE — Telephone Encounter (Signed)
-----   Message from Wendie Agreste, MD sent at 02/09/2018  2:53 PM EST ----- Call patient  Thyroid test was normal, HIV test and hepatitis C tests were nonreactive or normal.  Cholesterol was elevated, would recommend starting Lipitor at that level. Kidney function was increased from 2 years ago at 1.56. Heart failure test was slightly elevated. If she is wheezing or shortness of breath, return right away as it could be related to heart. Either way should follow-up within the next week if possible for repeat testing and exam.

## 2018-02-18 ENCOUNTER — Other Ambulatory Visit: Payer: Self-pay

## 2018-02-18 ENCOUNTER — Encounter: Payer: Self-pay | Admitting: Family Medicine

## 2018-02-18 ENCOUNTER — Ambulatory Visit: Payer: 59 | Admitting: Family Medicine

## 2018-02-18 VITALS — BP 138/70 | HR 83 | Temp 97.8°F | Resp 18 | Ht 61.0 in | Wt 230.6 lb

## 2018-02-18 DIAGNOSIS — N183 Chronic kidney disease, stage 3 unspecified: Secondary | ICD-10-CM | POA: Insufficient documentation

## 2018-02-18 DIAGNOSIS — I10 Essential (primary) hypertension: Secondary | ICD-10-CM | POA: Insufficient documentation

## 2018-02-18 DIAGNOSIS — R7989 Other specified abnormal findings of blood chemistry: Secondary | ICD-10-CM

## 2018-02-18 DIAGNOSIS — R062 Wheezing: Secondary | ICD-10-CM | POA: Diagnosis not present

## 2018-02-18 NOTE — Progress Notes (Signed)
Subjective:  By signing my name below, I, Clifflena Tiah, attest that this documentation has been prepared under the direction and in the presence of Carlota Raspberry, Ranell Patrick, MD, Electronically Signed: Valeta Harms, Medical Scribe 02/18/2018 at 4:33 PM.    Patient ID: Patricia Wu, female    DOB: [birth year], 62 y.o.   MRN: [id number] Chief Complaint  Patient presents with  . Hypertension    Pt states she has been checking BPs at work. Pt states when she check BP at work she got 168/ 80 "something"  . Follow-up    1 week follow up    HPI   NASHA DISS is a 62 y.o. female who presents to Primary Care at [facility] for a f/u of HTN. Pt presents with her daughter. Pt was last seen on 02/09 for establish care. Pt had been off medication for some time. Pt was restarted on previous medications and she was overall asymptomatic at last visit. She did endorse some DOE, slight wheezing thought to be bronchospasm, and an albuterol inhaler was provided. Lungs were clear but BNP was elevated at 939 on send out lab work, suggesting possible heart failure. Asked that she return to discuss that further. Pt states that she only uses albuterol rarely as needed and she reports that the wheezing has gone away.  Denies chest pain, shortness of breath at present. Pt notes that she takes 2 Aleve every other day for aches and pains.  HTN Pt states that she checks her BP at home. Pt reports that her BP ranges from158 to 160 when she does check it at home.Pt denies pressure in the chest, chest pain, or SOB.  There are no active problems to display for this patient.  Past Medical History:  Diagnosis Date  . Hypercholesteremia   . Hypertension   . Noncompliance with medication regimen    No past surgical history on file. No Known Allergies Prior to Admission medications   Medication Sig Start Date End Date Taking? Authorizing Provider  albuterol (PROVENTIL HFA;VENTOLIN HFA) 108 (90 Base) MCG/ACT inhaler Inhale  1-2 puffs into the lungs every 4 (four) hours as needed for wheezing or shortness of breath. 02/01/18   Wendie Agreste, MD  amLODipine (NORVASC) 10 MG tablet Take 1 tablet (10 mg total) by mouth daily. 02/01/18   Wendie Agreste, MD  lisinopril-hydrochlorothiazide (ZESTORETIC) 20-12.5 MG tablet Take 1 tablet by mouth daily. 02/01/18   Wendie Agreste, MD  simvastatin (ZOCOR) 10 MG tablet Take 1 tablet (10 mg total) by mouth daily. 02/01/18   Wendie Agreste, MD  lisinopril (PRINIVIL,ZESTRIL) 20 MG tablet Take 1 tablet (20 mg total) by mouth daily. Patient not taking: Reported on 11/09/2015 05/05/14 11/09/15  Copland, Gay Filler, MD   Social History   Socioeconomic History  . Marital status: Married    Spouse name: Not on file  . Number of children: Not on file  . Years of education: Not on file  . Highest education level: Not on file  Social Needs  . Financial resource strain: Not on file  . Food insecurity - worry: Not on file  . Food insecurity - inability: Not on file  . Transportation needs - medical: Not on file  . Transportation needs - non-medical: Not on file  Occupational History  . Not on file  Tobacco Use  . Smoking status: Never Smoker  . Smokeless tobacco: Never Used  Substance and Sexual Activity  . Alcohol use: No  .  Drug use: No  . Sexual activity: Not on file  Other Topics Concern  . Not on file  Social History Narrative  . Not on file    Review of Systems  Constitutional: Negative for fatigue and unexpected weight change.  Respiratory: Negative for chest tightness and shortness of breath.   Cardiovascular: Negative for chest pain, palpitations and leg swelling.  Gastrointestinal: Negative for abdominal pain and blood in stool.  Neurological: Negative for dizziness, syncope, light-headedness and headaches.     Objective:   Physical Exam  Constitutional: She is oriented to person, place, and time. She appears well-developed and well-nourished. No distress.   HENT:  Head: Normocephalic and atraumatic.  Right Ear: Hearing, tympanic membrane, external ear and ear canal normal.  Left Ear: Hearing, tympanic membrane, external ear and ear canal normal.  Nose: Nose normal.  Mouth/Throat: Oropharynx is clear and moist. No oropharyngeal exudate.  Eyes: Conjunctivae and EOM are normal. Pupils are equal, round, and reactive to light.  Neck: Carotid bruit is not present.  Cardiovascular: Normal rate, regular rhythm, normal heart sounds and intact distal pulses.  No murmur heard. Pulmonary/Chest: Effort normal and breath sounds normal. No respiratory distress. She has no wheezes. She has no rhonchi.  Abdominal: Soft. She exhibits no pulsatile midline mass. There is no tenderness.  Musculoskeletal: She exhibits edema (trace pedal ).  Neurological: She is alert and oriented to person, place, and time.  Skin: Skin is warm and dry. No rash noted.  Psychiatric: She has a normal mood and affect. Her behavior is normal.  Vitals reviewed.  Vitals:   02/18/18 1654  BP: 138/70  Pulse: 83  Resp: 18  Temp: 97.8 F (36.6 C)  TempSrc: Oral  SpO2: 100%  Weight: 230 lb 9.6 oz (104.6 kg)  Height: 5\' 1"  (1.549 m)        Assessment & Plan:   ARMELIA PENTON is a 62 y.o. female Essential hypertension - Plan: Basic metabolic panel, Ambulatory referral to Cardiology  -Improved back on medication. Continue same dose Norvasc, lisinopril HCTZ for now. Repeat BMP given prior elevation, but suspected chronic kidney disease- see below  Elevated brain natriuretic peptide (BNP) level - Plan: Pro b natriuretic peptide, Ambulatory referral to Cardiology  -Suspicious for CHF, and in retrospect may have been contributing to wheeze. No rales on exam, appears to not have fluid overload at present.  -Refer to cardiology, repeat BNP, ER/RTC precautions if increasing dyspnea, new chest pains  CKD (chronic kidney disease) stage 3, GFR 30-59 ml/min (HCC) - Plan: Basic metabolic  panel, Ambulatory referral to Nephrology  -Suspect this is from hypertension, but will refer to nephrology for further evaluation  - Repeat BMP since restarting ACE inhibitor.   -Stressed importance of avoiding NSAIDs as she has used frequently by history.   Wheezing  -As above may have been related to CHF, but appears to have reactive airway as improved with albuterol inhaler. Has albuterol if needed, ER/RTC precautions if recurrent symptoms  No orders of the defined types were placed in this encounter.  Patient Instructions   Continue same dose of blood pressure medication for now. With the elevated heart failure tests, I am referring you to cardiology for further testing. If any increased shortness of breath or wheezing, return here or emergency room right away. If you have chest pains, be seen right away.  Avoid any NSAIDs including Aleve or ibuprofen as that can worsen kidney function. Tylenol is needed for episodic pain. I will  refer you to kidney specialist and will recheck kidney function today.  See information below on the DASH diet to help cut sodium for blood pressure control.   Recheck in 1 month, sooner if worse.    Chronic Kidney Disease, Adult Chronic kidney disease (CKD) happens when the kidneys are damaged during a time of 3 or more months. The kidneys are two organs that do many important jobs in the body. These jobs include:  Removing wastes and extra fluids from the blood.  Making hormones that maintain the amount of fluid in your tissues and blood vessels.  Making sure that the body has the right amount of fluids and chemicals.  Most of the time, this condition does not go away, but it can usually be controlled. Steps must be taken to slow down the kidney damage or stop it from getting worse. Otherwise, the kidneys may stop working. Follow these instructions at home:  Follow your diet as told by your doctor. You may need to avoid alcohol, salty foods (sodium),  and foods that are high in potassium, calcium, and protein.  Take over-the-counter and prescription medicines only as told by your doctor. Do not take any new medicines unless your doctor says you can do that. These include vitamins and minerals. ? Medicines and nutritional supplements can make kidney damage worse. ? Your doctor may need to change how much medicine you take.  Do not use any tobacco products. These include cigarettes, chewing tobacco, and e-cigarettes. If you need help quitting, ask your doctor.  Keep all follow-up visits as told by your doctor. This is important.  Check your blood pressure. Tell your doctor if there are changes to your blood pressure.  Get to a healthy weight. Stay at that weight. If you need help with this, ask your doctor.  Start or continue an exercise plan. Try to exercise at least 30 minutes a day, 5 days a week.  Stay up-to-date with your shots (immunizations) as told by your doctor. Contact a doctor if:  Your symptoms get worse.  You have new symptoms. Get help right away if:  You have symptoms of end-stage kidney disease. These include: ? Headaches. ? Skin that is darker or lighter than normal. ? Numbness in your hands or feet. ? Easy bruising. ? Having hiccups often. ? Chest pain. ? Shortness of breath. ? Stopping of menstrual periods in women.  You have a fever.  You are making very little pee (urine).  You have pain or bleeding when you pee (urinate). This information is not intended to replace advice given to you by your health care provider. Make sure you discuss any questions you have with your health care provider. Document Released: 03/06/2010 Document Revised: 05/17/2016 Document Reviewed: 08/08/2012 Elsevier Interactive Patient Education  2017 [employer].  Hypertension Hypertension, commonly called high blood pressure, is when the force of blood pumping through the arteries is too strong. The arteries are the blood  vessels that carry blood from the heart throughout the body. Hypertension forces the heart to work harder to pump blood and may cause arteries to become narrow or stiff. Having untreated or uncontrolled hypertension can cause heart attacks, strokes, kidney disease, and other problems. A blood pressure reading consists of a higher number over a lower number. Ideally, your blood pressure should be below 120/80. The first ("top") number is called the systolic pressure. It is a measure of the pressure in your arteries as your heart beats. The second ("bottom") number is  called the diastolic pressure. It is a measure of the pressure in your arteries as the heart relaxes. What are the causes? The cause of this condition is not known. What increases the risk? Some risk factors for high blood pressure are under your control. Others are not. Factors you can change  Smoking.  Having type 2 diabetes mellitus, high cholesterol, or both.  Not getting enough exercise or physical activity.  Being overweight.  Having too much fat, sugar, calories, or salt (sodium) in your diet.  Drinking too much alcohol. Factors that are difficult or impossible to change  Having chronic kidney disease.  Having a family history of high blood pressure.  Age. Risk increases with age.  Race. You may be at higher risk if you are African-American.  Gender. Men are at higher risk than women before age 36. After age 87, women are at higher risk than men.  Having obstructive sleep apnea.  Stress. What are the signs or symptoms? Extremely high blood pressure (hypertensive crisis) may cause:  Headache.  Anxiety.  Shortness of breath.  Nosebleed.  Nausea and vomiting.  Severe chest pain.  Jerky movements you cannot control (seizures).  How is this diagnosed? This condition is diagnosed by measuring your blood pressure while you are seated, with your arm resting on a surface. The cuff of the blood pressure  monitor will be placed directly against the skin of your upper arm at the level of your heart. It should be measured at least twice using the same arm. Certain conditions can cause a difference in blood pressure between your right and left arms. Certain factors can cause blood pressure readings to be lower or higher than normal (elevated) for a short period of time:  When your blood pressure is higher when you are in a health care provider's office than when you are at home, this is called white coat hypertension. Most people with this condition do not need medicines.  When your blood pressure is higher at home than when you are in a health care provider's office, this is called masked hypertension. Most people with this condition may need medicines to control blood pressure.  If you have a high blood pressure reading during one visit or you have normal blood pressure with other risk factors:  You may be asked to return on a different day to have your blood pressure checked again.  You may be asked to monitor your blood pressure at home for 1 week or longer.  If you are diagnosed with hypertension, you may have other blood or imaging tests to help your health care provider understand your overall risk for other conditions. How is this treated? This condition is treated by making healthy lifestyle changes, such as eating healthy foods, exercising more, and reducing your alcohol intake. Your health care provider may prescribe medicine if lifestyle changes are not enough to get your blood pressure under control, and if:  Your systolic blood pressure is above 130.  Your diastolic blood pressure is above 80.  Your personal target blood pressure may vary depending on your medical conditions, your age, and other factors. Follow these instructions at home: Eating and drinking  Eat a diet that is high in fiber and potassium, and low in sodium, added sugar, and fat. An example eating plan is called  the DASH (Dietary Approaches to Stop Hypertension) diet. To eat this way: ? Eat plenty of fresh fruits and vegetables. Try to fill half of your plate at each  meal with fruits and vegetables. ? Eat whole grains, such as whole wheat pasta, brown rice, or whole grain bread. Fill about one quarter of your plate with whole grains. ? Eat or drink low-fat dairy products, such as skim milk or low-fat yogurt. ? Avoid fatty cuts of meat, processed or cured meats, and poultry with skin. Fill about one quarter of your plate with lean proteins, such as fish, chicken without skin, beans, eggs, and tofu. ? Avoid premade and processed foods. These tend to be higher in sodium, added sugar, and fat.  Reduce your daily sodium intake. Most people with hypertension should eat less than 1,500 mg of sodium a day.  Limit alcohol intake to no more than 1 drink a day for nonpregnant women and 2 drinks a day for men. One drink equals 12 oz of beer, 5 oz of wine, or 1 oz of hard liquor. Lifestyle  Work with your health care provider to maintain a healthy body weight or to lose weight. Ask what an ideal weight is for you.  Get at least 30 minutes of exercise that causes your heart to beat faster (aerobic exercise) most days of the week. Activities may include walking, swimming, or biking.  Include exercise to strengthen your muscles (resistance exercise), such as pilates or lifting weights, as part of your weekly exercise routine. Try to do these types of exercises for 30 minutes at least 3 days a week.  Do not use any products that contain nicotine or tobacco, such as cigarettes and e-cigarettes. If you need help quitting, ask your health care provider.  Monitor your blood pressure at home as told by your health care provider.  Keep all follow-up visits as told by your health care provider. This is important. Medicines  Take over-the-counter and prescription medicines only as told by your health care provider. Follow  directions carefully. Blood pressure medicines must be taken as prescribed.  Do not skip doses of blood pressure medicine. Doing this puts you at risk for problems and can make the medicine less effective.  Ask your health care provider about side effects or reactions to medicines that you should watch for. Contact a health care provider if:  You think you are having a reaction to a medicine you are taking.  You have headaches that keep coming back (recurring).  You feel dizzy.  You have swelling in your ankles.  You have trouble with your vision. Get help right away if:  You develop a severe headache or confusion.  You have unusual weakness or numbness.  You feel faint.  You have severe pain in your chest or abdomen.  You vomit repeatedly.  You have trouble breathing. Summary  Hypertension is when the force of blood pumping through your arteries is too strong. If this condition is not controlled, it may put you at risk for serious complications.  Your personal target blood pressure may vary depending on your medical conditions, your age, and other factors. For most people, a normal blood pressure is less than 120/80.  Hypertension is treated with lifestyle changes, medicines, or a combination of both. Lifestyle changes include weight loss, eating a healthy, low-sodium diet, exercising more, and limiting alcohol. This information is not intended to replace advice given to you by your health care provider. Make sure you discuss any questions you have with your health care provider. Document Released: 12/10/2005 Document Revised: 11/07/2016 Document Reviewed: 11/07/2016 Elsevier Interactive Patient Education  2018 [place] Eating Plan DASH  stands for "Dietary Approaches to Stop Hypertension." The DASH eating plan is a healthy eating plan that has been shown to reduce high blood pressure (hypertension). It may also reduce your risk for type 2 diabetes, heart disease,  and stroke. The DASH eating plan may also help with weight loss. What are tips for following this plan? General guidelines  Avoid eating more than 2,300 mg (milligrams) of salt (sodium) a day. If you have hypertension, you may need to reduce your sodium intake to 1,500 mg a day.  Limit alcohol intake to no more than 1 drink a day for nonpregnant women and 2 drinks a day for men. One drink equals 12 oz of beer, 5 oz of wine, or 1 oz of hard liquor.  Work with your health care provider to maintain a healthy body weight or to lose weight. Ask what an ideal weight is for you.  Get at least 30 minutes of exercise that causes your heart to beat faster (aerobic exercise) most days of the week. Activities may include walking, swimming, or biking.  Work with your health care provider or diet and nutrition specialist (dietitian) to adjust your eating plan to your individual calorie needs. Reading food labels  Check food labels for the amount of sodium per serving. Choose foods with less than 5 percent of the Daily Value of sodium. Generally, foods with less than 300 mg of sodium per serving fit into this eating plan.  To find whole grains, look for the word "whole" as the first word in the ingredient list. Shopping  Buy products labeled as "low-sodium" or "no salt added."  Buy fresh foods. Avoid canned foods and premade or frozen meals. Cooking  Avoid adding salt when cooking. Use salt-free seasonings or herbs instead of table salt or sea salt. Check with your health care provider or pharmacist before using salt substitutes.  Do not fry foods. Cook foods using healthy methods such as baking, boiling, grilling, and broiling instead.  Cook with heart-healthy oils, such as olive, canola, soybean, or sunflower oil. Meal planning   Eat a balanced diet that includes: ? 5 or more servings of fruits and vegetables each day. At each meal, try to fill half of your plate with fruits and  vegetables. ? Up to 6-8 servings of whole grains each day. ? Less than 6 oz of lean meat, poultry, or fish each day. A 3-oz serving of meat is about the same size as a deck of cards. One egg equals 1 oz. ? 2 servings of low-fat dairy each day. ? A serving of nuts, seeds, or beans 5 times each week. ? Heart-healthy fats. Healthy fats called Omega-3 fatty acids are found in foods such as flaxseeds and coldwater fish, like sardines, salmon, and mackerel.  Limit how much you eat of the following: ? Canned or prepackaged foods. ? Food that is high in trans fat, such as fried foods. ? Food that is high in saturated fat, such as fatty meat. ? Sweets, desserts, sugary drinks, and other foods with added sugar. ? Full-fat dairy products.  Do not salt foods before eating.  Try to eat at least 2 vegetarian meals each week.  Eat more home-cooked food and less restaurant, buffet, and fast food.  When eating at a restaurant, ask that your food be prepared with less salt or no salt, if possible. What foods are recommended? The items listed may not be a complete list. Talk with your dietitian about what dietary choices are  best for you. Grains Whole-grain or whole-wheat bread. Whole-grain or whole-wheat pasta. Brown rice. [place]. Bulgur. Whole-grain and low-sodium cereals. Pita bread. Low-fat, low-sodium crackers. Whole-wheat flour tortillas. Vegetables Fresh or frozen vegetables (raw, steamed, roasted, or grilled). Low-sodium or reduced-sodium tomato and vegetable juice. Low-sodium or reduced-sodium tomato sauce and tomato paste. Low-sodium or reduced-sodium canned vegetables. Fruits All fresh, dried, or frozen fruit. Canned fruit in natural juice (without added sugar). Meat and other protein foods Skinless chicken or [place]. Ground chicken or [place]. Pork with fat trimmed off. Fish and seafood. Egg whites. Dried beans, peas, or lentils. Unsalted nuts, nut butters, and seeds. Unsalted canned  beans. Lean cuts of beef with fat trimmed off. Low-sodium, lean deli meat. Dairy Low-fat (1%) or fat-free (skim) milk. Fat-free, low-fat, or reduced-fat cheeses. Nonfat, low-sodium ricotta or cottage cheese. Low-fat or nonfat yogurt. Low-fat, low-sodium cheese. Fats and oils Soft margarine without trans fats. Vegetable oil. Low-fat, reduced-fat, or light mayonnaise and salad dressings (reduced-sodium). Canola, safflower, olive, soybean, and sunflower oils. Avocado. Seasoning and other foods Herbs. Spices. Seasoning mixes without salt. Unsalted popcorn and pretzels. Fat-free sweets. What foods are not recommended? The items listed may not be a complete list. Talk with your dietitian about what dietary choices are best for you. Grains Baked goods made with fat, such as croissants, muffins, or some breads. Dry pasta or rice meal packs. Vegetables Creamed or fried vegetables. Vegetables in a cheese sauce. Regular canned vegetables (not low-sodium or reduced-sodium). Regular canned tomato sauce and paste (not low-sodium or reduced-sodium). Regular tomato and vegetable juice (not low-sodium or reduced-sodium). Angie Fava. Olives. Fruits Canned fruit in a light or heavy syrup. Fried fruit. Fruit in cream or butter sauce. Meat and other protein foods Fatty cuts of meat. Ribs. Fried meat. Berniece Salines. Sausage. Bologna and other processed lunch meats. Salami. Fatback. Hotdogs. Bratwurst. Salted nuts and seeds. Canned beans with added salt. Canned or smoked fish. Whole eggs or egg yolks. Chicken or [place] with skin. Dairy Whole or 2% milk, cream, and half-and-half. Whole or full-fat cream cheese. Whole-fat or sweetened yogurt. Full-fat cheese. Nondairy creamers. Whipped toppings. Processed cheese and cheese spreads. Fats and oils Butter. Stick margarine. Lard. Shortening. Ghee. Bacon fat. Tropical oils, such as coconut, palm kernel, or palm oil. Seasoning and other foods Salted popcorn and pretzels. Onion salt,  garlic salt, seasoned salt, table salt, and sea salt. Worcestershire sauce. Tartar sauce. Barbecue sauce. Teriyaki sauce. Soy sauce, including reduced-sodium. Steak sauce. Canned and packaged gravies. Fish sauce. Oyster sauce. Cocktail sauce. Horseradish that you find on the shelf. Ketchup. Mustard. Meat flavorings and tenderizers. Bouillon cubes. Hot sauce and Tabasco sauce. Premade or packaged marinades. Premade or packaged taco seasonings. Relishes. Regular salad dressings. Where to find more information:  National Heart, Lung, and [place]: https://wilson-eaton.com/  American Heart Association: www.heart.org Summary  The DASH eating plan is a healthy eating plan that has been shown to reduce high blood pressure (hypertension). It may also reduce your risk for type 2 diabetes, heart disease, and stroke.  With the DASH eating plan, you should limit salt (sodium) intake to 2,300 mg a day. If you have hypertension, you may need to reduce your sodium intake to 1,500 mg a day.  When on the DASH eating plan, aim to eat more fresh fruits and vegetables, whole grains, lean proteins, low-fat dairy, and heart-healthy fats.  Work with your health care provider or diet and nutrition specialist (dietitian) to adjust your eating plan to your individual calorie needs. This  information is not intended to replace advice given to you by your health care provider. Make sure you discuss any questions you have with your health care provider. Document Released: 11/29/2011 Document Revised: 12/03/2016 Document Reviewed: 12/03/2016 Elsevier Interactive Patient Education  [employer].    We recommend that you schedule a mammogram for breast cancer screening. Typically, you do not need a referral to do this. Please contact a local imaging center to schedule your mammogram.  [facility] - [phone]  *ask for the Radiology Department [facility] ([place]) - [phone] or  [phone]  [facility] - [phone] [place] [phone] [facility] - [phone]  *ask for the [facility] - [phone]  *ask for the Radiology Department [facility] - [phone]  *ask for the [facility] - [phone]   IF you received an x-ray today, you will receive an invoice from [facility] Radiology. Please contact [facility] Radiology at [phone] with questions or concerns regarding your invoice.   IF you received labwork today, you will receive an invoice from [facility]. Please contact [facility] at [phone] with questions or concerns regarding your invoice.   Our billing staff will not be able to assist you with questions regarding bills from these companies.  You will be contacted with the lab results as soon as they are available. The fastest way to get your results is to activate your My Chart account. Instructions are located on the last page of this paperwork. If you have not heard from [place] regarding the results in 2 weeks, please contact this office.      I personally performed the services described in this documentation, which was scribed in my presence. The recorded information has been reviewed and considered for accuracy and completeness, addended by me as needed, and agree with information above.  Signed,   Merri Ray, MD [facility].  02/19/18 9:39 PM

## 2018-02-18 NOTE — Patient Instructions (Addendum)
Continue same dose of blood pressure medication for now. With the elevated heart failure tests, I am referring you to cardiology for further testing. If any increased shortness of breath or wheezing, return here or emergency room right away. If you have chest pains, be seen right away.  Avoid any NSAIDs including Aleve or ibuprofen as that can worsen kidney function. Tylenol is needed for episodic pain. I will refer you to kidney specialist and will recheck kidney function today.  See information below on the DASH diet to help cut sodium for blood pressure control.   Recheck in 1 month, sooner if worse.    Chronic Kidney Disease, Adult Chronic kidney disease (CKD) happens when the kidneys are damaged during a time of 3 or more months. The kidneys are two organs that do many important jobs in the body. These jobs include:  Removing wastes and extra fluids from the blood.  Making hormones that maintain the amount of fluid in your tissues and blood vessels.  Making sure that the body has the right amount of fluids and chemicals.  Most of the time, this condition does not go away, but it can usually be controlled. Steps must be taken to slow down the kidney damage or stop it from getting worse. Otherwise, the kidneys may stop working. Follow these instructions at home:  Follow your diet as told by your doctor. You may need to avoid alcohol, salty foods (sodium), and foods that are high in potassium, calcium, and protein.  Take over-the-counter and prescription medicines only as told by your doctor. Do not take any new medicines unless your doctor says you can do that. These include vitamins and minerals. ? Medicines and nutritional supplements can make kidney damage worse. ? Your doctor may need to change how much medicine you take.  Do not use any tobacco products. These include cigarettes, chewing tobacco, and e-cigarettes. If you need help quitting, ask your doctor.  Keep all follow-up  visits as told by your doctor. This is important.  Check your blood pressure. Tell your doctor if there are changes to your blood pressure.  Get to a healthy weight. Stay at that weight. If you need help with this, ask your doctor.  Start or continue an exercise plan. Try to exercise at least 30 minutes a day, 5 days a week.  Stay up-to-date with your shots (immunizations) as told by your doctor. Contact a doctor if:  Your symptoms get worse.  You have new symptoms. Get help right away if:  You have symptoms of end-stage kidney disease. These include: ? Headaches. ? Skin that is darker or lighter than normal. ? Numbness in your hands or feet. ? Easy bruising. ? Having hiccups often. ? Chest pain. ? Shortness of breath. ? Stopping of menstrual periods in women.  You have a fever.  You are making very little pee (urine).  You have pain or bleeding when you pee (urinate). This information is not intended to replace advice given to you by your health care provider. Make sure you discuss any questions you have with your health care provider. Document Released: 03/06/2010 Document Revised: 05/17/2016 Document Reviewed: 08/08/2012 Elsevier Interactive Patient Education  2017 [employer].  Hypertension Hypertension, commonly called high blood pressure, is when the force of blood pumping through the arteries is too strong. The arteries are the blood vessels that carry blood from the heart throughout the body. Hypertension forces the heart to work harder to pump blood and may cause arteries  to become narrow or stiff. Having untreated or uncontrolled hypertension can cause heart attacks, strokes, kidney disease, and other problems. A blood pressure reading consists of a higher number over a lower number. Ideally, your blood pressure should be below 120/80. The first ("top") number is called the systolic pressure. It is a measure of the pressure in your arteries as your heart beats. The  second ("bottom") number is called the diastolic pressure. It is a measure of the pressure in your arteries as the heart relaxes. What are the causes? The cause of this condition is not known. What increases the risk? Some risk factors for high blood pressure are under your control. Others are not. Factors you can change  Smoking.  Having type 2 diabetes mellitus, high cholesterol, or both.  Not getting enough exercise or physical activity.  Being overweight.  Having too much fat, sugar, calories, or salt (sodium) in your diet.  Drinking too much alcohol. Factors that are difficult or impossible to change  Having chronic kidney disease.  Having a family history of high blood pressure.  Age. Risk increases with age.  Race. You may be at higher risk if you are African-American.  Gender. Men are at higher risk than women before age 72. After age 67, women are at higher risk than men.  Having obstructive sleep apnea.  Stress. What are the signs or symptoms? Extremely high blood pressure (hypertensive crisis) may cause:  Headache.  Anxiety.  Shortness of breath.  Nosebleed.  Nausea and vomiting.  Severe chest pain.  Jerky movements you cannot control (seizures).  How is this diagnosed? This condition is diagnosed by measuring your blood pressure while you are seated, with your arm resting on a surface. The cuff of the blood pressure monitor will be placed directly against the skin of your upper arm at the level of your heart. It should be measured at least twice using the same arm. Certain conditions can cause a difference in blood pressure between your right and left arms. Certain factors can cause blood pressure readings to be lower or higher than normal (elevated) for a short period of time:  When your blood pressure is higher when you are in a health care provider's office than when you are at home, this is called white coat hypertension. Most people with this  condition do not need medicines.  When your blood pressure is higher at home than when you are in a health care provider's office, this is called masked hypertension. Most people with this condition may need medicines to control blood pressure.  If you have a high blood pressure reading during one visit or you have normal blood pressure with other risk factors:  You may be asked to return on a different day to have your blood pressure checked again.  You may be asked to monitor your blood pressure at home for 1 week or longer.  If you are diagnosed with hypertension, you may have other blood or imaging tests to help your health care provider understand your overall risk for other conditions. How is this treated? This condition is treated by making healthy lifestyle changes, such as eating healthy foods, exercising more, and reducing your alcohol intake. Your health care provider may prescribe medicine if lifestyle changes are not enough to get your blood pressure under control, and if:  Your systolic blood pressure is above 130.  Your diastolic blood pressure is above 80.  Your personal target blood pressure may vary depending on your  medical conditions, your age, and other factors. Follow these instructions at home: Eating and drinking  Eat a diet that is high in fiber and potassium, and low in sodium, added sugar, and fat. An example eating plan is called the DASH (Dietary Approaches to Stop Hypertension) diet. To eat this way: ? Eat plenty of fresh fruits and vegetables. Try to fill half of your plate at each meal with fruits and vegetables. ? Eat whole grains, such as whole wheat pasta, brown rice, or whole grain bread. Fill about one quarter of your plate with whole grains. ? Eat or drink low-fat dairy products, such as skim milk or low-fat yogurt. ? Avoid fatty cuts of meat, processed or cured meats, and poultry with skin. Fill about one quarter of your plate with lean proteins, such  as fish, chicken without skin, beans, eggs, and tofu. ? Avoid premade and processed foods. These tend to be higher in sodium, added sugar, and fat.  Reduce your daily sodium intake. Most people with hypertension should eat less than 1,500 mg of sodium a day.  Limit alcohol intake to no more than 1 drink a day for nonpregnant women and 2 drinks a day for men. One drink equals 12 oz of beer, 5 oz of wine, or 1 oz of hard liquor. Lifestyle  Work with your health care provider to maintain a healthy body weight or to lose weight. Ask what an ideal weight is for you.  Get at least 30 minutes of exercise that causes your heart to beat faster (aerobic exercise) most days of the week. Activities may include walking, swimming, or biking.  Include exercise to strengthen your muscles (resistance exercise), such as pilates or lifting weights, as part of your weekly exercise routine. Try to do these types of exercises for 30 minutes at least 3 days a week.  Do not use any products that contain nicotine or tobacco, such as cigarettes and e-cigarettes. If you need help quitting, ask your health care provider.  Monitor your blood pressure at home as told by your health care provider.  Keep all follow-up visits as told by your health care provider. This is important. Medicines  Take over-the-counter and prescription medicines only as told by your health care provider. Follow directions carefully. Blood pressure medicines must be taken as prescribed.  Do not skip doses of blood pressure medicine. Doing this puts you at risk for problems and can make the medicine less effective.  Ask your health care provider about side effects or reactions to medicines that you should watch for. Contact a health care provider if:  You think you are having a reaction to a medicine you are taking.  You have headaches that keep coming back (recurring).  You feel dizzy.  You have swelling in your ankles.  You have  trouble with your vision. Get help right away if:  You develop a severe headache or confusion.  You have unusual weakness or numbness.  You feel faint.  You have severe pain in your chest or abdomen.  You vomit repeatedly.  You have trouble breathing. Summary  Hypertension is when the force of blood pumping through your arteries is too strong. If this condition is not controlled, it may put you at risk for serious complications.  Your personal target blood pressure may vary depending on your medical conditions, your age, and other factors. For most people, a normal blood pressure is less than 120/80.  Hypertension is treated with lifestyle changes, medicines, or  a combination of both. Lifestyle changes include weight loss, eating a healthy, low-sodium diet, exercising more, and limiting alcohol. This information is not intended to replace advice given to you by your health care provider. Make sure you discuss any questions you have with your health care provider. Document Released: 12/10/2005 Document Revised: 11/07/2016 Document Reviewed: 11/07/2016 Elsevier Interactive Patient Education  2018 [place] Eating Plan DASH stands for "Dietary Approaches to Stop Hypertension." The DASH eating plan is a healthy eating plan that has been shown to reduce high blood pressure (hypertension). It may also reduce your risk for type 2 diabetes, heart disease, and stroke. The DASH eating plan may also help with weight loss. What are tips for following this plan? General guidelines  Avoid eating more than 2,300 mg (milligrams) of salt (sodium) a day. If you have hypertension, you may need to reduce your sodium intake to 1,500 mg a day.  Limit alcohol intake to no more than 1 drink a day for nonpregnant women and 2 drinks a day for men. One drink equals 12 oz of beer, 5 oz of wine, or 1 oz of hard liquor.  Work with your health care provider to maintain a healthy body weight or to lose  weight. Ask what an ideal weight is for you.  Get at least 30 minutes of exercise that causes your heart to beat faster (aerobic exercise) most days of the week. Activities may include walking, swimming, or biking.  Work with your health care provider or diet and nutrition specialist (dietitian) to adjust your eating plan to your individual calorie needs. Reading food labels  Check food labels for the amount of sodium per serving. Choose foods with less than 5 percent of the Daily Value of sodium. Generally, foods with less than 300 mg of sodium per serving fit into this eating plan.  To find whole grains, look for the word "whole" as the first word in the ingredient list. Shopping  Buy products labeled as "low-sodium" or "no salt added."  Buy fresh foods. Avoid canned foods and premade or frozen meals. Cooking  Avoid adding salt when cooking. Use salt-free seasonings or herbs instead of table salt or sea salt. Check with your health care provider or pharmacist before using salt substitutes.  Do not fry foods. Cook foods using healthy methods such as baking, boiling, grilling, and broiling instead.  Cook with heart-healthy oils, such as olive, canola, soybean, or sunflower oil. Meal planning   Eat a balanced diet that includes: ? 5 or more servings of fruits and vegetables each day. At each meal, try to fill half of your plate with fruits and vegetables. ? Up to 6-8 servings of whole grains each day. ? Less than 6 oz of lean meat, poultry, or fish each day. A 3-oz serving of meat is about the same size as a deck of cards. One egg equals 1 oz. ? 2 servings of low-fat dairy each day. ? A serving of nuts, seeds, or beans 5 times each week. ? Heart-healthy fats. Healthy fats called Omega-3 fatty acids are found in foods such as flaxseeds and coldwater fish, like sardines, salmon, and mackerel.  Limit how much you eat of the following: ? Canned or prepackaged foods. ? Food that is high  in trans fat, such as fried foods. ? Food that is high in saturated fat, such as fatty meat. ? Sweets, desserts, sugary drinks, and other foods with added sugar. ? Full-fat dairy products.  Do  not salt foods before eating.  Try to eat at least 2 vegetarian meals each week.  Eat more home-cooked food and less restaurant, buffet, and fast food.  When eating at a restaurant, ask that your food be prepared with less salt or no salt, if possible. What foods are recommended? The items listed may not be a complete list. Talk with your dietitian about what dietary choices are best for you. Grains Whole-grain or whole-wheat bread. Whole-grain or whole-wheat pasta. Brown rice. [place]. Bulgur. Whole-grain and low-sodium cereals. Pita bread. Low-fat, low-sodium crackers. Whole-wheat flour tortillas. Vegetables Fresh or frozen vegetables (raw, steamed, roasted, or grilled). Low-sodium or reduced-sodium tomato and vegetable juice. Low-sodium or reduced-sodium tomato sauce and tomato paste. Low-sodium or reduced-sodium canned vegetables. Fruits All fresh, dried, or frozen fruit. Canned fruit in natural juice (without added sugar). Meat and other protein foods Skinless chicken or [place]. Ground chicken or [place]. Pork with fat trimmed off. Fish and seafood. Egg whites. Dried beans, peas, or lentils. Unsalted nuts, nut butters, and seeds. Unsalted canned beans. Lean cuts of beef with fat trimmed off. Low-sodium, lean deli meat. Dairy Low-fat (1%) or fat-free (skim) milk. Fat-free, low-fat, or reduced-fat cheeses. Nonfat, low-sodium ricotta or cottage cheese. Low-fat or nonfat yogurt. Low-fat, low-sodium cheese. Fats and oils Soft margarine without trans fats. Vegetable oil. Low-fat, reduced-fat, or light mayonnaise and salad dressings (reduced-sodium). Canola, safflower, olive, soybean, and sunflower oils. Avocado. Seasoning and other foods Herbs. Spices. Seasoning mixes without salt. Unsalted  popcorn and pretzels. Fat-free sweets. What foods are not recommended? The items listed may not be a complete list. Talk with your dietitian about what dietary choices are best for you. Grains Baked goods made with fat, such as croissants, muffins, or some breads. Dry pasta or rice meal packs. Vegetables Creamed or fried vegetables. Vegetables in a cheese sauce. Regular canned vegetables (not low-sodium or reduced-sodium). Regular canned tomato sauce and paste (not low-sodium or reduced-sodium). Regular tomato and vegetable juice (not low-sodium or reduced-sodium). Angie Fava. Olives. Fruits Canned fruit in a light or heavy syrup. Fried fruit. Fruit in cream or butter sauce. Meat and other protein foods Fatty cuts of meat. Ribs. Fried meat. Berniece Salines. Sausage. Bologna and other processed lunch meats. Salami. Fatback. Hotdogs. Bratwurst. Salted nuts and seeds. Canned beans with added salt. Canned or smoked fish. Whole eggs or egg yolks. Chicken or [place] with skin. Dairy Whole or 2% milk, cream, and half-and-half. Whole or full-fat cream cheese. Whole-fat or sweetened yogurt. Full-fat cheese. Nondairy creamers. Whipped toppings. Processed cheese and cheese spreads. Fats and oils Butter. Stick margarine. Lard. Shortening. Ghee. Bacon fat. Tropical oils, such as coconut, palm kernel, or palm oil. Seasoning and other foods Salted popcorn and pretzels. Onion salt, garlic salt, seasoned salt, table salt, and sea salt. Worcestershire sauce. Tartar sauce. Barbecue sauce. Teriyaki sauce. Soy sauce, including reduced-sodium. Steak sauce. Canned and packaged gravies. Fish sauce. Oyster sauce. Cocktail sauce. Horseradish that you find on the shelf. Ketchup. Mustard. Meat flavorings and tenderizers. Bouillon cubes. Hot sauce and Tabasco sauce. Premade or packaged marinades. Premade or packaged taco seasonings. Relishes. Regular salad dressings. Where to find more information:  National Heart, Lung, and [place]: https://wilson-eaton.com/  American Heart Association: www.heart.org Summary  The DASH eating plan is a healthy eating plan that has been shown to reduce high blood pressure (hypertension). It may also reduce your risk for type 2 diabetes, heart disease, and stroke.  With the DASH eating plan, you should limit salt (sodium) intake  to 2,300 mg a day. If you have hypertension, you may need to reduce your sodium intake to 1,500 mg a day.  When on the DASH eating plan, aim to eat more fresh fruits and vegetables, whole grains, lean proteins, low-fat dairy, and heart-healthy fats.  Work with your health care provider or diet and nutrition specialist (dietitian) to adjust your eating plan to your individual calorie needs. This information is not intended to replace advice given to you by your health care provider. Make sure you discuss any questions you have with your health care provider. Document Released: 11/29/2011 Document Revised: 12/03/2016 Document Reviewed: 12/03/2016 Elsevier Interactive Patient Education  [employer].    We recommend that you schedule a mammogram for breast cancer screening. Typically, you do not need a referral to do this. Please contact a local imaging center to schedule your mammogram.  [facility] - [phone]  *ask for the Radiology Department [facility] ([place]) - [phone] or [phone]  [facility] - [phone] [place] [phone] [facility] - [phone]  *ask for the [facility] - [phone]  *ask for the Radiology Department [facility] - [phone]  *ask for the [place] - [phone]   IF you received an x-ray today, you will receive an invoice from [facility] Radiology. Please contact [facility] Radiology at [phone] with questions or concerns regarding your  invoice.   IF you received labwork today, you will receive an invoice from [facility]. Please contact [facility] at [phone] with questions or concerns regarding your invoice.   Our billing staff will not be able to assist you with questions regarding bills from these companies.  You will be contacted with the lab results as soon as they are available. The fastest way to get your results is to activate your My Chart account. Instructions are located on the last page of this paperwork. If you have not heard from [place] regarding the results in 2 weeks, please contact this office.

## 2018-02-19 LAB — BASIC METABOLIC PANEL
BUN/Creatinine Ratio: 15 (ref 12–28)
BUN: 27 mg/dL (ref 8–27)
CALCIUM: 9.7 mg/dL (ref 8.7–10.3)
CO2: 24 mmol/L (ref 20–29)
CREATININE: 1.76 mg/dL — AB (ref 0.57–1.00)
Chloride: 105 mmol/L (ref 96–106)
GFR calc Af Amer: 35 mL/min/{1.73_m2} — ABNORMAL LOW (ref >59)
GFR, EST NON AFRICAN AMERICAN: 31 mL/min/{1.73_m2} — AB (ref >59)
GLUCOSE: 110 mg/dL — AB (ref 65–99)
Potassium: 4.3 mmol/L (ref 3.5–5.2)
SODIUM: 145 mmol/L — AB (ref 134–144)

## 2018-02-19 LAB — PRO B NATRIURETIC PEPTIDE: NT-Pro BNP: 158 pg/mL (ref 0–287)

## 2018-02-25 ENCOUNTER — Telehealth: Payer: Self-pay | Admitting: Family Medicine

## 2018-02-25 NOTE — Telephone Encounter (Signed)
Patient called regarding her lab results- patient notified of her results- she was instructed to avoid NSAIDS and increase fluid intake. She is to return for lab next week.  Lab results not in basket.

## 2018-03-05 ENCOUNTER — Ambulatory Visit (INDEPENDENT_AMBULATORY_CARE_PROVIDER_SITE_OTHER): Payer: 59 | Admitting: Family Medicine

## 2018-03-05 DIAGNOSIS — N183 Chronic kidney disease, stage 3 unspecified: Secondary | ICD-10-CM

## 2018-03-05 NOTE — Progress Notes (Signed)
Lab only visit 

## 2018-03-06 LAB — BASIC METABOLIC PANEL
BUN/Creatinine Ratio: 15 (ref 12–28)
BUN: 26 mg/dL (ref 8–27)
CO2: 23 mmol/L (ref 20–29)
CREATININE: 1.78 mg/dL — AB (ref 0.57–1.00)
Calcium: 9.9 mg/dL (ref 8.7–10.3)
Chloride: 105 mmol/L (ref 96–106)
GFR calc Af Amer: 35 mL/min/{1.73_m2} — ABNORMAL LOW (ref >59)
GFR, EST NON AFRICAN AMERICAN: 30 mL/min/{1.73_m2} — AB (ref >59)
Glucose: 95 mg/dL (ref 65–99)
POTASSIUM: 4.4 mmol/L (ref 3.5–5.2)
SODIUM: 144 mmol/L (ref 134–144)

## 2018-03-11 ENCOUNTER — Encounter: Payer: Self-pay | Admitting: *Deleted

## 2018-03-17 ENCOUNTER — Encounter: Payer: Self-pay | Admitting: *Deleted

## 2018-03-18 ENCOUNTER — Encounter: Payer: Self-pay | Admitting: Family Medicine

## 2018-03-18 ENCOUNTER — Other Ambulatory Visit: Payer: Self-pay

## 2018-03-18 ENCOUNTER — Ambulatory Visit (INDEPENDENT_AMBULATORY_CARE_PROVIDER_SITE_OTHER): Payer: 59 | Admitting: Family Medicine

## 2018-03-18 VITALS — BP 142/86 | HR 81 | Temp 98.0°F | Resp 18 | Ht 60.63 in | Wt 226.0 lb

## 2018-03-18 DIAGNOSIS — J45901 Unspecified asthma with (acute) exacerbation: Secondary | ICD-10-CM

## 2018-03-18 DIAGNOSIS — J309 Allergic rhinitis, unspecified: Secondary | ICD-10-CM

## 2018-03-18 DIAGNOSIS — I10 Essential (primary) hypertension: Secondary | ICD-10-CM

## 2018-03-18 DIAGNOSIS — N189 Chronic kidney disease, unspecified: Secondary | ICD-10-CM | POA: Diagnosis not present

## 2018-03-18 DIAGNOSIS — J069 Acute upper respiratory infection, unspecified: Secondary | ICD-10-CM | POA: Diagnosis not present

## 2018-03-18 NOTE — Patient Instructions (Addendum)
Flonase nasal spray or claritin if needed for allergies. No decongestants (like sudafed).  Albuterol if needed. If you do require that more than 2-3 times per day, sooner than every 4 hours, or continue to need multiple times per day for more than 2 days - return for recheck. Return to the clinic or go to the nearest emergency room if any of your symptoms worsen or new symptoms occur.  mucinex or tylenol for cold symptoms. Other cold meds may be raising your blood pressure. If pressure remains over 140/90 let me know so we can adjust your one pill.   Keep follow up with cardiologist and kidney specialist as planned.  Bronchospasm, Adult Bronchospasm is a tightening of the airways going into the lungs. During an episode, it may be harder to breathe. You may cough, and you may make a whistling sound when you breathe (wheeze). This condition often affects people with asthma. What are the causes? This condition is caused by swelling and irritation in the airways. It can be triggered by:  An infection (common).  Seasonal allergies.  An allergic reaction.  Exercise.  Irritants. These include pollution, cigarette smoke, strong odors, aerosol sprays, and paint fumes.  Weather changes. Winds increase molds and pollens in the air. Cold air may cause swelling.  Stress and emotional upset.  What are the signs or symptoms? Symptoms of this condition include:  Wheezing. If the episode was triggered by an allergy, wheezing may start right away or hours later.  Nighttime coughing.  Frequent or severe coughing with a simple cold.  Chest tightness.  Shortness of breath.  Decreased ability to exercise.  How is this diagnosed? This condition is usually diagnosed with a review of your medical history and a physical exam. Tests, such as lung function tests, are sometimes done to look for other conditions. The need for a chest X-ray depends on where the wheezing occurs and whether it is the  first time you have wheezed. How is this treated? This condition may be treated with:  Inhaled medicines. These open up the airways and help you breathe. They can be taken with an inhaler or a nebulizer device.  Corticosteroid medicines. These may be given for severe bronchospasm, usually when it is associated with asthma.  Avoiding triggers, such as irritants, infection, or allergies.  Follow these instructions at home: Medicines  Take over-the-counter and prescription medicines only as told by your health care provider.  If you need to use an inhaler or nebulizer to take your medicine, ask your health care provider to explain how to use it correctly. If you were given a spacer, always use it with your inhaler. Lifestyle  Reduce the number of triggers in your home. To do this: ? Change your heating and air conditioning filter at least once a month. ? Limit your use of fireplaces and wood stoves. ? Do not smoke. Do not allow smoking in your home. ? Avoid using perfumes and fragrances. ? Get rid of pests, such as roaches and mice, and their droppings. ? Remove any mold from your home. ? Keep your house clean and dust free. Use unscented cleaning products. ? Replace carpet with wood, tile, or vinyl flooring. Carpet can trap dander and dust. ? Use allergy-proof pillows, mattress covers, and box spring covers. ? Wash bed sheets and blankets every week in hot water. Dry them in a dryer. ? Use blankets that are made of polyester or cotton. ? Wash your hands often. ? Do not allow  pets in your bedroom.  Avoid breathing in cold air when you exercise. General instructions  Have a plan for seeking medical care. Know when to call your health care provider and local emergency services, and where to get emergency care.  Stay up to date on your immunizations.  When you have an episode of bronchospasm, stay calm. Try to relax and breathe more slowly.  If you have asthma, make sure you have  an asthma action plan.  Keep all follow-up visits as told by your health care provider. This is important. Contact a health care provider if:  You have muscle aches.  You have chest pain.  The mucus that you cough up (sputum) changes from clear or white to yellow, green, gray, or bloody.  You have a fever.  Your sputum gets thicker. Get help right away if:  Your wheezing and coughing get worse, even after you take your prescribed medicines.  It gets even harder to breathe.  You develop severe chest pain. Summary  Bronchospasm is a tightening of the airways going into the lungs.  During an episode of bronchospasm, you may have a harder time breathing. You may cough and make a whistling sound when you breathe (wheeze).  Avoid exposure to triggers such as smoke, dust, mold, animal dander, and fragrances.  When you have an episode of bronchospasm, stay calm. Try to relax and breathe more slowly. This information is not intended to replace advice given to you by your health care provider. Make sure you discuss any questions you have with your health care provider. Document Released: 12/13/2003 Document Revised: 12/06/2016 Document Reviewed: 12/06/2016 Elsevier Interactive Patient Education  2017 [employer].   IF you received an x-ray today, you will receive an invoice from [facility]. Please contact [facility] at [phone] with questions or concerns regarding your invoice.   IF you received labwork today, you will receive an invoice from [place]. Please contact [facility] at [phone] with questions or concerns regarding your invoice.   Our billing staff will not be able to assist you with questions regarding bills from these companies.  You will be contacted with the lab results as soon as they are available. The fastest way to get your results is to activate your My Chart account. Instructions are located on the last page of this paperwork. If you  have not heard from [place] regarding the results in 2 weeks, please contact this office.

## 2018-03-18 NOTE — Progress Notes (Addendum)
Subjective:  By signing my name below, I, Patricia Wu, attest that this documentation has been prepared under the direction and in the presence of Wendie Agreste, MD Electronically Signed: Ladene Artist, ED Scribe 03/18/2018 at 4:05 PM.   Patient ID: Patricia Wu, female    DOB: Feb 04, 1956, 62 y.o.   MRN: [id number]  Chief Complaint  Patient presents with  . Hypertension    f/u  . Cough    X 2 days   HPI Patricia Wu is a 62 y.o. female who presents to Primary Care at [facility] for f/u.  HTN Zestoretic 20-125 and Norvasc 10. Last seen 2/26. Thought to have componentof CHF with elevated BNP of 939, improved to normal range 4 wks ago. Wheezing had improved last visit, initially treated with albuterol for reactive airway. Referred to cardiology. Appointment 5/5. Pt has been compliant with medications. She has been checking her BP outside of the office with readings of 130s/80s. Denies dizziness, lightheadedness, cp, sob, blood in stools, melena, side-effects. BP Readings from Last 3 Encounters:  03/18/18 (!) 142/86  02/18/18 138/70  02/01/18 (!) 199/126   CKD Lab Results  Component Value Date   CREATININE 1.78 (H) 03/05/2018  Advised to avoid NSAIDs and stay hydrated. Referred to nephrology. Creatinine was stable on restart of ACE inhibitor. Referred to [facility] [employer].  Wt Readings from Last 3 Encounters:  03/18/18 226 lb (102.5 kg)  02/18/18 230 lb 9.6 oz (104.6 kg)  02/01/18 231 lb (104.8 kg)   Cough Pt reports cough with wheezing and hoarseness x 3-4 days. Pt reports associated symptoms of congestion, rhinorrhea, sneezing. She has used her Albuterol inhaler 2 days ago and again today. She has tried OTC cough and cold medication with the last dose being last night. Denies fever, eye itching.  Patient Active Problem List   Diagnosis Date Noted  . Essential hypertension 02/18/2018  . Elevated brain natriuretic peptide (BNP) level 02/18/2018  . CKD (chronic kidney  disease) stage 3, GFR 30-59 ml/min ([place]) 02/18/2018   Past Medical History:  Diagnosis Date  . Hypercholesteremia   . Hypertension   . Noncompliance with medication regimen    History reviewed. No pertinent surgical history. No Known Allergies Prior to Admission medications   Medication Sig Start Date End Date Taking? Authorizing Provider  albuterol (PROVENTIL HFA;VENTOLIN HFA) 108 (90 Base) MCG/ACT inhaler Inhale 1-2 puffs into the lungs every 4 (four) hours as needed for wheezing or shortness of breath. 02/01/18  Yes Wendie Agreste, MD  amLODipine (NORVASC) 10 MG tablet Take 1 tablet (10 mg total) by mouth daily. 02/01/18  Yes Wendie Agreste, MD  lisinopril-hydrochlorothiazide (ZESTORETIC) 20-12.5 MG tablet Take 1 tablet by mouth daily. 02/01/18  Yes Wendie Agreste, MD  simvastatin (ZOCOR) 10 MG tablet Take 1 tablet (10 mg total) by mouth daily. 02/01/18  Yes Wendie Agreste, MD  lisinopril (PRINIVIL,ZESTRIL) 20 MG tablet Take 1 tablet (20 mg total) by mouth daily. Patient not taking: Reported on 11/09/2015 05/05/14 11/09/15  Copland, Gay Filler, MD   Social History   Socioeconomic History  . Marital status: Married    Spouse name: Not on file  . Number of children: 2  . Years of education: Not on file  . Highest education level: Not on file  Occupational History  . Not on file  Social Needs  . Financial resource strain: Not on file  . Food insecurity:    Worry: Not on file  Inability: Not on file  . Transportation needs:    Medical: Not on file    Non-medical: Not on file  Tobacco Use  . Smoking status: Never Smoker  . Smokeless tobacco: Never Used  Substance and Sexual Activity  . Alcohol use: No  . Drug use: No  . Sexual activity: Yes  Lifestyle  . Physical activity:    Days per week: Not on file    Minutes per session: Not on file  . Stress: Not on file  Relationships  . Social connections:    Talks on phone: Not on file    Gets together: Not on file     Attends religious service: Not on file    Active member of club or organization: Not on file    Attends meetings of clubs or organizations: Not on file    Relationship status: Not on file  . Intimate partner violence:    Fear of current or ex partner: Not on file    Emotionally abused: Not on file    Physically abused: Not on file    Forced sexual activity: Not on file  Other Topics Concern  . Not on file  Social History Narrative  . Not on file    Review of Systems  Constitutional: Negative for fatigue, fever and unexpected weight change.  HENT: Positive for congestion, rhinorrhea, sneezing and voice change.   Eyes: Negative for itching.  Respiratory: Positive for cough and wheezing. Negative for chest tightness and shortness of breath.   Cardiovascular: Negative for chest pain, palpitations and leg swelling.  Gastrointestinal: Negative for abdominal pain and blood in stool.  Allergic/Immunologic: Positive for environmental allergies.  Neurological: Negative for dizziness, syncope, light-headedness and headaches.      Objective:   Physical Exam  Constitutional: She is oriented to person, place, and time. She appears well-developed and well-nourished. No distress.  HENT:  Head: Normocephalic and atraumatic.  Right Ear: Hearing, tympanic membrane, external ear and ear canal normal.  Left Ear: Hearing, tympanic membrane, external ear and ear canal normal.  Nose: Nose normal.  Mouth/Throat: Oropharynx is clear and moist. No oropharyngeal exudate.  Eyes: Pupils are equal, round, and reactive to light. Conjunctivae and EOM are normal.  Neck: Carotid bruit is not present.  Cardiovascular: Normal rate, regular rhythm, normal heart sounds and intact distal pulses.  No murmur heard. Pulmonary/Chest: Effort normal and breath sounds normal. No respiratory distress. She has no wheezes. She has no rhonchi. She has no rales.  Abdominal: Soft. She exhibits no pulsatile midline mass. There is  no tenderness.  Neurological: She is alert and oriented to person, place, and time.  Skin: Skin is warm and dry. No rash noted.  Psychiatric: She has a normal mood and affect. Her behavior is normal.  Vitals reviewed.  Vitals:   03/18/18 1518 03/18/18 1538  BP: (!) 145/84 (!) 142/86  Pulse: 81   Resp: 18   Temp: 98 F (36.7 C)   TempSrc: Oral   SpO2: 99%   Weight: 226 lb (102.5 kg)   Height: 5' 0.63" (1.54 m)       Assessment & Plan:   SPARKLE AUBE is a 62 y.o. female Acute upper respiratory infection Reactive airway disease with acute exacerbation, unspecified asthma severity, unspecified whether persistent Allergic rhinitis, unspecified seasonality, unspecified trigger  -Likely recent viral respiratory infection versus component of allergies.  Symptomatic care discussed, but avoid decongestant.  Albuterol if needed for wheezing.  Flonase or Claritin if needed  for allergies.  RTC precautions if worsening or persistent need for albuterol inhaler  Essential hypertension Chronic kidney disease, unspecified CKD stage  -Somewhat elevated readings may be related to cold medication.  Decided to remain on same doses of meds, monitor home readings of cold meds.  Follow-up with cardiology and nephrology as planned.  No orders of the defined types were placed in this encounter.  Patient Instructions    Flonase nasal spray or claritin if needed for allergies. No decongestants (like sudafed).  Albuterol if needed. If you do require that more than 2-3 times per day, sooner than every 4 hours, or continue to need multiple times per day for more than 2 days - return for recheck. Return to the clinic or go to the nearest emergency room if any of your symptoms worsen or new symptoms occur.  mucinex or tylenol for cold symptoms. Other cold meds may be raising your blood pressure. If pressure remains over 140/90 let me know so we can adjust your one pill.   Keep follow up with cardiologist  and kidney specialist as planned.  Bronchospasm, Adult Bronchospasm is a tightening of the airways going into the lungs. During an episode, it may be harder to breathe. You may cough, and you may make a whistling sound when you breathe (wheeze). This condition often affects people with asthma. What are the causes? This condition is caused by swelling and irritation in the airways. It can be triggered by:  An infection (common).  Seasonal allergies.  An allergic reaction.  Exercise.  Irritants. These include pollution, cigarette smoke, strong odors, aerosol sprays, and paint fumes.  Weather changes. Winds increase molds and pollens in the air. Cold air may cause swelling.  Stress and emotional upset.  What are the signs or symptoms? Symptoms of this condition include:  Wheezing. If the episode was triggered by an allergy, wheezing may start right away or hours later.  Nighttime coughing.  Frequent or severe coughing with a simple cold.  Chest tightness.  Shortness of breath.  Decreased ability to exercise.  How is this diagnosed? This condition is usually diagnosed with a review of your medical history and a physical exam. Tests, such as lung function tests, are sometimes done to look for other conditions. The need for a chest X-ray depends on where the wheezing occurs and whether it is the first time you have wheezed. How is this treated? This condition may be treated with:  Inhaled medicines. These open up the airways and help you breathe. They can be taken with an inhaler or a nebulizer device.  Corticosteroid medicines. These may be given for severe bronchospasm, usually when it is associated with asthma.  Avoiding triggers, such as irritants, infection, or allergies.  Follow these instructions at home: Medicines  Take over-the-counter and prescription medicines only as told by your health care provider.  If you need to use an inhaler or nebulizer to take your  medicine, ask your health care provider to explain how to use it correctly. If you were given a spacer, always use it with your inhaler. Lifestyle  Reduce the number of triggers in your home. To do this: ? Change your heating and air conditioning filter at least once a month. ? Limit your use of fireplaces and wood stoves. ? Do not smoke. Do not allow smoking in your home. ? Avoid using perfumes and fragrances. ? Get rid of pests, such as roaches and mice, and their droppings. ? Remove any mold from  your home. ? Keep your house clean and dust free. Use unscented cleaning products. ? Replace carpet with wood, tile, or vinyl flooring. Carpet can trap dander and dust. ? Use allergy-proof pillows, mattress covers, and box spring covers. ? Wash bed sheets and blankets every week in hot water. Dry them in a dryer. ? Use blankets that are made of polyester or cotton. ? Wash your hands often. ? Do not allow pets in your bedroom.  Avoid breathing in cold air when you exercise. General instructions  Have a plan for seeking medical care. Know when to call your health care provider and local emergency services, and where to get emergency care.  Stay up to date on your immunizations.  When you have an episode of bronchospasm, stay calm. Try to relax and breathe more slowly.  If you have asthma, make sure you have an asthma action plan.  Keep all follow-up visits as told by your health care provider. This is important. Contact a health care provider if:  You have muscle aches.  You have chest pain.  The mucus that you cough up (sputum) changes from clear or white to yellow, green, gray, or bloody.  You have a fever.  Your sputum gets thicker. Get help right away if:  Your wheezing and coughing get worse, even after you take your prescribed medicines.  It gets even harder to breathe.  You develop severe chest pain. Summary  Bronchospasm is a tightening of the airways going into the  lungs.  During an episode of bronchospasm, you may have a harder time breathing. You may cough and make a whistling sound when you breathe (wheeze).  Avoid exposure to triggers such as smoke, dust, mold, animal dander, and fragrances.  When you have an episode of bronchospasm, stay calm. Try to relax and breathe more slowly. This information is not intended to replace advice given to you by your health care provider. Make sure you discuss any questions you have with your health care provider. Document Released: 12/13/2003 Document Revised: 12/06/2016 Document Reviewed: 12/06/2016 Elsevier Interactive Patient Education  2017 [employer].   IF you received an x-ray today, you will receive an invoice from [facility]. Please contact [facility] at [phone] with questions or concerns regarding your invoice.   IF you received labwork today, you will receive an invoice from [place]. Please contact [facility] at [phone] with questions or concerns regarding your invoice.   Our billing staff will not be able to assist you with questions regarding bills from these companies.  You will be contacted with the lab results as soon as they are available. The fastest way to get your results is to activate your My Chart account. Instructions are located on the last page of this paperwork. If you have not heard from [place] regarding the results in 2 weeks, please contact this office.      I personally performed the services described in this documentation, which was scribed in my presence. The recorded information has been reviewed and considered for accuracy and completeness, addended by me as needed, and agree with information above.  Signed,   Merri Ray, MD [facility] at [place].  03/22/18 12:34 AM

## 2018-03-28 ENCOUNTER — Ambulatory Visit: Payer: Commercial Managed Care - HMO | Admitting: Student

## 2018-04-02 ENCOUNTER — Ambulatory Visit: Payer: Commercial Managed Care - HMO | Admitting: Adult Health

## 2018-04-09 ENCOUNTER — Other Ambulatory Visit: Payer: Self-pay | Admitting: Family Medicine

## 2018-04-09 DIAGNOSIS — I10 Essential (primary) hypertension: Secondary | ICD-10-CM

## 2018-04-09 DIAGNOSIS — E785 Hyperlipidemia, unspecified: Secondary | ICD-10-CM

## 2018-04-17 ENCOUNTER — Ambulatory Visit: Payer: 59 | Admitting: Cardiovascular Disease

## 2018-04-23 DIAGNOSIS — I129 Hypertensive chronic kidney disease with stage 1 through stage 4 chronic kidney disease, or unspecified chronic kidney disease: Secondary | ICD-10-CM | POA: Diagnosis not present

## 2018-04-23 DIAGNOSIS — N183 Chronic kidney disease, stage 3 (moderate): Secondary | ICD-10-CM | POA: Diagnosis not present

## 2018-04-29 ENCOUNTER — Other Ambulatory Visit: Payer: Self-pay | Admitting: Nephrology

## 2018-04-29 ENCOUNTER — Ambulatory Visit: Payer: 59 | Admitting: Cardiovascular Disease

## 2018-04-29 ENCOUNTER — Encounter: Payer: Self-pay | Admitting: Cardiovascular Disease

## 2018-04-29 VITALS — BP 124/70 | HR 95 | Ht 64.0 in | Wt 220.0 lb

## 2018-04-29 DIAGNOSIS — I10 Essential (primary) hypertension: Secondary | ICD-10-CM

## 2018-04-29 DIAGNOSIS — R7989 Other specified abnormal findings of blood chemistry: Secondary | ICD-10-CM

## 2018-04-29 DIAGNOSIS — I130 Hypertensive heart and chronic kidney disease with heart failure and stage 1 through stage 4 chronic kidney disease, or unspecified chronic kidney disease: Secondary | ICD-10-CM

## 2018-04-29 DIAGNOSIS — N183 Chronic kidney disease, stage 3 (moderate): Principal | ICD-10-CM

## 2018-04-29 NOTE — Addendum Note (Signed)
Addended by: Barbarann Ehlers A on: 04/29/2018 03:09 PM   Modules accepted: Orders

## 2018-04-29 NOTE — Addendum Note (Signed)
Addended by: Barbarann Ehlers A on: 04/29/2018 03:02 PM   Modules accepted: Orders

## 2018-04-29 NOTE — Progress Notes (Signed)
CARDIOLOGY CONSULT NOTE  Patient ID: Patricia Wu MRN: [id number] DOB/AGE: [birth year] 62 y.o.  Admit date: (Not on file) Primary Physician: Patricia Agreste, MD Referring Physician: Wendie Agreste, MD  Reason for Consultation: Elevated N-terminal pro BNP  HPI: Patricia Wu is a 62 y.o. female who is being seen today for the evaluation of elevated N-terminal pro BNP at the request of Patricia Agreste, MD.   She was evaluated by her PCP initially on 02/01/2018.  She is a history of hypertension and hyperlipidemia.  She had been complaining of exertional dyspnea and wheezing.  N-terminal proBNP was elevated at 939.  It was rechecked on 02/18/2018 and was normal at 158.  She also has chronic kidney disease stage III.  Creatinine was 1.56 on 02/01/2018.  BUN was 18.  Lipid panel 02/01/2018: Total cholesterol 245, triglycerides 107, HDL 49, LDL 175.  She was started on simvastatin 10 mg.  Blood pressure has been as high as 199/126 on 02/01/2018.  It was 142/86 on 03/18/2018. It is 124/70 today.  She currently denies chest pain, palpitations, orthopnea, paroxysmal nocturnal dyspnea, dizziness, and shortness of breath.  Her husband says that she wheezes at night.  She also snores but there have been no witnessed apneic episodes.  She does have seasonal allergies.  Family history: Her sister had a defibrillator placed this week.  It is unclear what her cardiac issues were that led to defibrillator placement (arrhythmia versus left ventricular dysfunction).     Allergies  Allergen Reactions  . Ibuprofen Other (See Comments)    pcp said raises her BP    Current Outpatient Medications  Medication Sig Dispense Refill  . albuterol (PROVENTIL HFA;VENTOLIN HFA) 108 (90 Base) MCG/ACT inhaler Inhale 1-2 puffs into the lungs every 4 (four) hours as needed for wheezing or shortness of breath. 1 Inhaler 0  . amLODipine (NORVASC) 10 MG tablet TAKE 1 TABLET(10 MG) BY MOUTH DAILY 60  tablet 0  . lisinopril-hydrochlorothiazide (PRINZIDE,ZESTORETIC) 20-12.5 MG tablet TAKE 1 TABLET BY MOUTH DAILY 60 tablet 0  . simvastatin (ZOCOR) 10 MG tablet TAKE 1 TABLET(10 MG) BY MOUTH DAILY 60 tablet 0   No current facility-administered medications for this visit.     Past Medical History:  Diagnosis Date  . Hypercholesteremia   . Hypertension   . Noncompliance with medication regimen     History reviewed. No pertinent surgical history.  Social History   Socioeconomic History  . Marital status: Married    Spouse name: Not on file  . Number of children: 2  . Years of education: Not on file  . Highest education level: Not on file  Occupational History  . Not on file  Social Needs  . Financial resource strain: Not on file  . Food insecurity:    Worry: Not on file    Inability: Not on file  . Transportation needs:    Medical: Not on file    Non-medical: Not on file  Tobacco Use  . Smoking status: Never Smoker  . Smokeless tobacco: Never Used  Substance and Sexual Activity  . Alcohol use: No  . Drug use: No  . Sexual activity: Yes  Lifestyle  . Physical activity:    Days per week: Not on file    Minutes per session: Not on file  . Stress: Not on file  Relationships  . Social connections:    Talks on phone: Not on file    Gets together:  Not on file    Attends religious service: Not on file    Active member of club or organization: Not on file    Attends meetings of clubs or organizations: Not on file    Relationship status: Not on file  . Intimate partner violence:    Fear of current or ex partner: Not on file    Emotionally abused: Not on file    Physically abused: Not on file    Forced sexual activity: Not on file  Other Topics Concern  . Not on file  Social History Narrative  . Not on file     No family history of premature CAD in 1st degree relatives.  Current Meds  Medication Sig  . albuterol (PROVENTIL HFA;VENTOLIN HFA) 108 (90 Base) MCG/ACT  inhaler Inhale 1-2 puffs into the lungs every 4 (four) hours as needed for wheezing or shortness of breath.  Marland Kitchen amLODipine (NORVASC) 10 MG tablet TAKE 1 TABLET(10 MG) BY MOUTH DAILY  . lisinopril-hydrochlorothiazide (PRINZIDE,ZESTORETIC) 20-12.5 MG tablet TAKE 1 TABLET BY MOUTH DAILY  . simvastatin (ZOCOR) 10 MG tablet TAKE 1 TABLET(10 MG) BY MOUTH DAILY      Review of systems complete and found to be negative unless listed above in HPI    Physical exam Blood pressure 124/70, pulse 95, height 5\' 4"  (1.626 m), weight 220 lb (99.8 kg), SpO2 97 %. General: NAD Neck: No JVD, no thyromegaly or thyroid nodule.  Lungs: Clear to auscultation bilaterally with normal respiratory effort. CV: Nondisplaced PMI. Regular rate and rhythm, normal S1/S2, no S3/S4, no murmur.  Trace dorsal pedal edema.  No carotid bruit.  Abdomen: Soft, nontender, no distention.  Skin: Intact without lesions or rashes.  Neurologic: Alert and oriented x 3.  Psych: Normal affect. Extremities: No clubbing or cyanosis.  HEENT: Normal.   ECG: Most recent ECG reviewed.   Labs: Lab Results  Component Value Date/Time   K 4.4 03/05/2018 12:24 PM   BUN 26 03/05/2018 12:24 PM   CREATININE 1.78 (H) 03/05/2018 12:24 PM   CREATININE 1.14 (H) 05/05/2014 08:46 AM   ALT 14 02/01/2018 12:25 PM   TSH 1.470 02/01/2018 12:25 PM   HGB 11.8 (L) 11/09/2015 01:17 PM     Lipids: Lab Results  Component Value Date/Time   LDLCALC 175 (H) 02/01/2018 12:25 PM   CHOL 245 (H) 02/01/2018 12:25 PM   TRIG 107 02/01/2018 12:25 PM   HDL 49 02/01/2018 12:25 PM        ASSESSMENT AND PLAN:  1.  Elevated N-terminal pro BNP: This was likely due to severely elevated blood pressures at the time she had been off of antihypertensive therapy.  This will lead to increased left ventricular end-diastolic filling pressures causing the elevated N-terminal proBNP.  It finally normalized with normalization of blood pressure.  Blood pressure is normal  today.  She denies exertional dyspnea, orthopnea, and paroxysmal nocturnal dyspnea.  Her sister required a defibrillator placed earlier this week. I will order a 2-D echocardiogram with Doppler to evaluate cardiac structure, function, and regional wall motion.  2.  Hypertension: Blood pressure is normal.  No changes to therapy.    Disposition: Follow up to be determined  Signed: Kate Sable, M.D., F.A.C.C.  04/29/2018, 2:40 PM

## 2018-04-29 NOTE — Patient Instructions (Addendum)
Your physician wants you to follow-up in:  To be determined after echo     Your physician has requested that you have an echocardiogram. Echocardiography is a painless test that uses sound waves to create images of your heart. It provides your doctor with information about the size and shape of your heart and how well your heart's chambers and valves are working. This procedure takes approximately one hour. There are no restrictions for this procedure.     Your physician recommends that you continue on your current medications as directed. Please refer to the Current Medication list given to you today.     If you need a refill on your cardiac medications before your next appointment, please call your pharmacy.      No lab work or tests ordered today

## 2018-05-02 ENCOUNTER — Ambulatory Visit (HOSPITAL_COMMUNITY)
Admission: RE | Admit: 2018-05-02 | Discharge: 2018-05-02 | Disposition: A | Discharge status: Home / Self Care | Payer: 59 | Source: Ambulatory Visit | Attending: Adult Health | Admitting: Adult Health

## 2018-05-02 DIAGNOSIS — R7989 Other specified abnormal findings of blood chemistry: Secondary | ICD-10-CM | POA: Diagnosis not present

## 2018-05-02 DIAGNOSIS — I700 Atherosclerosis of aorta: Secondary | ICD-10-CM | POA: Diagnosis not present

## 2018-05-02 DIAGNOSIS — I10 Essential (primary) hypertension: Secondary | ICD-10-CM | POA: Insufficient documentation

## 2018-05-02 NOTE — Progress Notes (Signed)
*  PRELIMINARY RESULTS* Echocardiogram 2D Echocardiogram has been performed.  Patricia Wu 05/02/2018, 3:58 PM

## 2018-05-05 ENCOUNTER — Telehealth: Payer: Self-pay | Admitting: Adult Health

## 2018-05-05 NOTE — Telephone Encounter (Signed)
Left message to call back:  Notes recorded by Lendon Colonel, NP on 05/04/2018 at 11:41 AM EDT Echo is reviewed. Normal heart function with mild calcification on aortic valve. No changes in regimen.

## 2018-05-05 NOTE — Telephone Encounter (Signed)
New message  Pt verbalized that she is returning call for the rn   For results of echo  Notes recorded by Fidel Levy, RN on 05/05/2018 at 8:30 AM EDT LMTCB on [phone] (Home)

## 2018-05-05 NOTE — Telephone Encounter (Signed)
Patient made aware of results and verbalized her understanding.  

## 2018-05-05 NOTE — Telephone Encounter (Signed)
Follow up    Patient is returning call in reference to echocardiogram.

## 2018-06-09 ENCOUNTER — Ambulatory Visit: Payer: 59 | Admitting: Family Medicine

## 2018-06-18 ENCOUNTER — Ambulatory Visit
Admission: RE | Admit: 2018-06-18 | Discharge: 2018-06-18 | Disposition: A | Discharge status: Home / Self Care | Payer: 59 | Source: Ambulatory Visit | Attending: Nephrology | Admitting: Nephrology

## 2018-06-18 DIAGNOSIS — N189 Chronic kidney disease, unspecified: Secondary | ICD-10-CM | POA: Diagnosis not present

## 2018-06-18 DIAGNOSIS — N183 Chronic kidney disease, stage 3 unspecified: Secondary | ICD-10-CM

## 2018-06-18 DIAGNOSIS — I130 Hypertensive heart and chronic kidney disease with heart failure and stage 1 through stage 4 chronic kidney disease, or unspecified chronic kidney disease: Secondary | ICD-10-CM

## 2018-06-27 ENCOUNTER — Other Ambulatory Visit: Payer: Self-pay | Admitting: Family Medicine

## 2018-06-27 DIAGNOSIS — E785 Hyperlipidemia, unspecified: Secondary | ICD-10-CM

## 2018-06-27 DIAGNOSIS — I10 Essential (primary) hypertension: Secondary | ICD-10-CM

## 2018-07-01 ENCOUNTER — Ambulatory Visit (INDEPENDENT_AMBULATORY_CARE_PROVIDER_SITE_OTHER): Payer: 59 | Admitting: Family Medicine

## 2018-07-01 ENCOUNTER — Other Ambulatory Visit: Payer: Self-pay

## 2018-07-01 ENCOUNTER — Encounter: Payer: Self-pay | Admitting: Family Medicine

## 2018-07-01 VITALS — BP 130/70 | HR 80 | Temp 98.2°F | Ht 62.5 in | Wt 218.4 lb

## 2018-07-01 DIAGNOSIS — I129 Hypertensive chronic kidney disease with stage 1 through stage 4 chronic kidney disease, or unspecified chronic kidney disease: Secondary | ICD-10-CM | POA: Diagnosis not present

## 2018-07-01 DIAGNOSIS — E785 Hyperlipidemia, unspecified: Secondary | ICD-10-CM

## 2018-07-01 DIAGNOSIS — N189 Chronic kidney disease, unspecified: Secondary | ICD-10-CM

## 2018-07-01 DIAGNOSIS — M791 Myalgia, unspecified site: Secondary | ICD-10-CM

## 2018-07-01 DIAGNOSIS — N183 Chronic kidney disease, stage 3 (moderate): Secondary | ICD-10-CM | POA: Diagnosis not present

## 2018-07-01 DIAGNOSIS — I10 Essential (primary) hypertension: Secondary | ICD-10-CM

## 2018-07-01 NOTE — Progress Notes (Signed)
Subjective:  By signing my name below, I, Moises Blood, attest that this documentation has been prepared under the direction and in the presence of Merri Ray, MD. Electronically Signed: Moises Blood, [place]. 07/01/2018 , 5:30 PM .  Patient was seen in Room 1 .   Patient ID: Patricia Wu, female    DOB: Apr 19, 1956, 62 y.o.   MRN: [id number] Chief Complaint  Patient presents with  . Chronic condition    3 month f/u (BP)   Patricia Wu is a 62 y.o. female Here for follow up. She has a history of HTN with CKD, hyperlipidemia and suspected CHF with elevated BNP. She had a fruit cup at about 2:00PM today.   History of elevated BNP  See prior notes. She was seen by Dr. Bronson Ing on May 7th. Her BNP was initially obtained due to dyspnea on exertion and wheezing; was thought elevated BNP was associated due to significantly high BP off medications, with normalization as BP controlled. She had an echocardiogram on May 10th with normal heart function, mild aortic valve calcification and EF 55-60%.   Hyperlipidemia Lab Results  Component Value Date   CHOL 245 (H) 02/01/2018   HDL 49 02/01/2018   LDLCALC 175 (H) 02/01/2018   TRIG 107 02/01/2018   CHOLHDL 5.0 (H) 02/01/2018   Lab Results  Component Value Date   ALT 14 02/01/2018   AST 13 02/01/2018   ALKPHOS 191 (H) 02/01/2018   BILITOT 0.4 02/01/2018   She was recommended on Lipitor in February. She had been on Zocor 10 mg but had been off of Zocor when lipids obtained. She has not been rechecked since she restarted her medication. She's currently on Zocor 10 mg qd.   She mentions intermittent left upper leg muscle ache, but may be associated to standing at work.   HTN with CKD  BP Readings from Last 3 Encounters:  07/01/18 130/70  04/29/18 124/70  03/18/18 (!) 142/86   Lab Results  Component Value Date   CREATININE 1.78 (H) 03/05/2018   She is currently taking lisinopril-HCTZ 20-12.5 mg and Norvasc 10 mg qd.  She was seen by nephrology at [place] on 04/23/18; thought to have glomerulosclerosis. Advised to avoid NSAIDs, and maintain low salt diet. She's continued on current regime, and recheck in 3 months.   She denies any side effects with her medications. She denies any shortness of breath, chest pain, lightheadedness or dizziness. She notes having an appointment on 07/30/18.   Patient Active Problem List   Diagnosis Date Noted  . Essential hypertension 02/18/2018  . Elevated brain natriuretic peptide (BNP) level 02/18/2018  . CKD (chronic kidney disease) stage 3, GFR 30-59 ml/min ([place]) 02/18/2018   Past Medical History:  Diagnosis Date  . Hypercholesteremia   . Hypertension   . Noncompliance with medication regimen    No past surgical history on file. Allergies  Allergen Reactions  . Ibuprofen Other (See Comments)    pcp said raises her BP   Prior to Admission medications   Medication Sig Start Date End Date Taking? Authorizing Provider  albuterol (PROVENTIL HFA;VENTOLIN HFA) 108 (90 Base) MCG/ACT inhaler Inhale 1-2 puffs into the lungs every 4 (four) hours as needed for wheezing or shortness of breath. 02/01/18   Wendie Agreste, MD  amLODipine (NORVASC) 10 MG tablet TAKE 1 TABLET(10 MG) BY MOUTH DAILY 06/27/18   Wendie Agreste, MD  lisinopril-hydrochlorothiazide (PRINZIDE,ZESTORETIC) 20-12.5 MG tablet TAKE 1 TABLET BY MOUTH DAILY 06/27/18  Wendie Agreste, MD  simvastatin (ZOCOR) 10 MG tablet TAKE 1 TABLET(10 MG) BY MOUTH DAILY 06/27/18   Wendie Agreste, MD   Social History   Socioeconomic History  . Marital status: Married    Spouse name: Not on file  . Number of children: 2  . Years of education: Not on file  . Highest education level: Not on file  Occupational History  . Not on file  Social Needs  . Financial resource strain: Not on file  . Food insecurity:    Worry: Not on file    Inability: Not on file  . Transportation needs:    Medical: Not on file     Non-medical: Not on file  Tobacco Use  . Smoking status: Never Smoker  . Smokeless tobacco: Never Used  Substance and Sexual Activity  . Alcohol use: No  . Drug use: No  . Sexual activity: Yes  Lifestyle  . Physical activity:    Days per week: Not on file    Minutes per session: Not on file  . Stress: Not on file  Relationships  . Social connections:    Talks on phone: Not on file    Gets together: Not on file    Attends religious service: Not on file    Active member of club or organization: Not on file    Attends meetings of clubs or organizations: Not on file    Relationship status: Not on file  . Intimate partner violence:    Fear of current or ex partner: Not on file    Emotionally abused: Not on file    Physically abused: Not on file    Forced sexual activity: Not on file  Other Topics Concern  . Not on file  Social History Narrative  . Not on file   Review of Systems  Constitutional: Negative for fatigue and unexpected weight change.  Respiratory: Negative for chest tightness and shortness of breath.   Cardiovascular: Negative for chest pain, palpitations and leg swelling.  Gastrointestinal: Negative for abdominal pain and blood in stool.  Neurological: Negative for dizziness, syncope, light-headedness and headaches.       Objective:   Physical Exam  Constitutional: She is oriented to person, place, and time. She appears well-developed and well-nourished.  HENT:  Head: Normocephalic and atraumatic.  Eyes: Pupils are equal, round, and reactive to light. Conjunctivae and EOM are normal.  Neck: Carotid bruit is not present.  Cardiovascular: Normal rate, regular rhythm, normal heart sounds and intact distal pulses.  Pulmonary/Chest: Effort normal and breath sounds normal.  Abdominal: Soft. She exhibits no pulsatile midline mass. There is no tenderness.  Neurological: She is alert and oriented to person, place, and time.  Skin: Skin is warm and dry.    Psychiatric: She has a normal mood and affect. Her behavior is normal.  Vitals reviewed.   Vitals:   07/01/18 1638 07/01/18 1639  BP: (!) 167/88 130/70  Pulse: 80   Temp: 98.2 F (36.8 C)   TempSrc: Oral   SpO2: 100%   Weight: 218 lb 6.4 oz (99.1 kg)   Height: 5' 2.5" (1.588 m)        Assessment & Plan:  Patricia Wu is a 62 y.o. female Essential hypertension - Plan: Comprehensive metabolic panel  -  Stable, tolerating current regimen. Medications refilled. Labs pending as above.   - prior elevated BNP appears to have been due to uncontrolled HTN, not CHF. Importance of med adherence and  BP control discussed.   Hyperlipidemia, unspecified hyperlipidemia type - Plan: Lipid panel, Comprehensive metabolic panel Myalgia - Plan: CK  - tolerating Zocor, except notes episodic myalgia in leg. Will check CPK with upcoming bloodwork. Has not had repeat testing on meds, but may need stronger statin. Return for fasting labs in next 1 week. Consider short trial off statin if myalgia persists, but rtc to discuss other causes if persistent.    Chronic kidney disease, unspecified CKD stage - Plan: Comprehensive metabolic panel  - check stability with CMP, avoid nsaids and BP control importance discussed. Continue planned follow up with nephrology.   No orders of the defined types were placed in this encounter.  Patient Instructions   Current meds appear to be working well.  Please return for fasting blood test in next 1 week for fasting blood test.  You do not need an appointment - just let them know you are here for bloodwork only.  Keep follow up with kidney specialist as planned. Avoid NSAIDS such as advil or alleve.   I will check a muscle enzyme test with upcoming labs, but if any continued muscle aches, please return for recheck and discuss those symptoms further.    IF you received an x-ray today, you will receive an invoice from [facility]. Please contact [facility]  Radiology at [phone] with questions or concerns regarding your invoice.   IF you received labwork today, you will receive an invoice from [facility]. Please contact [facility] at [phone] with questions or concerns regarding your invoice.   Our billing staff will not be able to assist you with questions regarding bills from these companies.  You will be contacted with the lab results as soon as they are available. The fastest way to get your results is to activate your My Chart account. Instructions are located on the last page of this paperwork. If you have not heard from [place] regarding the results in 2 weeks, please contact this office.       I personally performed the services described in this documentation, which was scribed in my presence. The recorded information has been reviewed and considered for accuracy and completeness, addended by me as needed, and agree with information above.  Signed,   Merri Ray, MD [facility] [place].  07/02/18 5:16 PM

## 2018-07-01 NOTE — Patient Instructions (Addendum)
Current meds appear to be working well.  Please return for fasting blood test in next 1 week for fasting blood test.  You do not need an appointment - just let them know you are here for bloodwork only.  Keep follow up with kidney specialist as planned. Avoid NSAIDS such as advil or alleve.   I will check a muscle enzyme test with upcoming labs, but if any continued muscle aches, please return for recheck and discuss those symptoms further.    IF you received an x-ray today, you will receive an invoice from [facility] Radiology. Please contact [facility] Radiology at [phone] with questions or concerns regarding your invoice.   IF you received labwork today, you will receive an invoice from [place]. Please contact [facility] at [phone] with questions or concerns regarding your invoice.   Our billing staff will not be able to assist you with questions regarding bills from these companies.  You will be contacted with the lab results as soon as they are available. The fastest way to get your results is to activate your My Chart account. Instructions are located on the last page of this paperwork. If you have not heard from [place] regarding the results in 2 weeks, please contact this office.

## 2018-07-02 ENCOUNTER — Encounter: Payer: Self-pay | Admitting: Family Medicine

## 2018-07-05 ENCOUNTER — Ambulatory Visit (INDEPENDENT_AMBULATORY_CARE_PROVIDER_SITE_OTHER): Payer: 59 | Admitting: Family Medicine

## 2018-07-05 DIAGNOSIS — E785 Hyperlipidemia, unspecified: Secondary | ICD-10-CM

## 2018-07-05 DIAGNOSIS — N189 Chronic kidney disease, unspecified: Secondary | ICD-10-CM | POA: Diagnosis not present

## 2018-07-05 DIAGNOSIS — M791 Myalgia, unspecified site: Secondary | ICD-10-CM | POA: Diagnosis not present

## 2018-07-05 DIAGNOSIS — I10 Essential (primary) hypertension: Secondary | ICD-10-CM

## 2018-07-06 LAB — LIPID PANEL
CHOLESTEROL TOTAL: 178 mg/dL (ref 100–199)
Chol/HDL Ratio: 3.8 ratio (ref 0.0–4.4)
HDL: 47 mg/dL (ref >39)
LDL CALC: 115 mg/dL — AB (ref 0–99)
Triglycerides: 82 mg/dL (ref 0–149)
VLDL CHOLESTEROL CAL: 16 mg/dL (ref 5–40)

## 2018-07-06 LAB — COMPREHENSIVE METABOLIC PANEL
ALBUMIN: 4 g/dL (ref 3.6–4.8)
ALK PHOS: 157 IU/L — AB (ref 39–117)
ALT: 9 IU/L (ref 0–32)
AST: 9 IU/L (ref 0–40)
Albumin/Globulin Ratio: 1.3 (ref 1.2–2.2)
BILIRUBIN TOTAL: 0.3 mg/dL (ref 0.0–1.2)
BUN / CREAT RATIO: 12 (ref 12–28)
BUN: 22 mg/dL (ref 8–27)
CHLORIDE: 110 mmol/L — AB (ref 96–106)
CO2: 18 mmol/L — AB (ref 20–29)
CREATININE: 1.86 mg/dL — AB (ref 0.57–1.00)
Calcium: 9.5 mg/dL (ref 8.7–10.3)
GFR calc Af Amer: 33 mL/min/{1.73_m2} — ABNORMAL LOW (ref >59)
GFR calc non Af Amer: 29 mL/min/{1.73_m2} — ABNORMAL LOW (ref >59)
GLUCOSE: 95 mg/dL (ref 65–99)
Globulin, Total: 3.2 g/dL (ref 1.5–4.5)
Potassium: 4.2 mmol/L (ref 3.5–5.2)
Sodium: 142 mmol/L (ref 134–144)
Total Protein: 7.2 g/dL (ref 6.0–8.5)

## 2018-07-06 LAB — CK: CK TOTAL: 66 U/L (ref 24–173)

## 2018-07-06 NOTE — Progress Notes (Signed)
Lab only visit 

## 2018-07-23 ENCOUNTER — Telehealth: Payer: Self-pay

## 2018-07-23 NOTE — Telephone Encounter (Signed)
Pt. Given results and instructions. Verbalizes understanding. Appointment scheduled for follow up to discuss labs and treatment.

## 2018-08-05 ENCOUNTER — Ambulatory Visit: Payer: 59 | Admitting: Family Medicine

## 2018-08-05 ENCOUNTER — Encounter: Payer: Self-pay | Admitting: Family Medicine

## 2018-08-05 ENCOUNTER — Other Ambulatory Visit: Payer: Self-pay

## 2018-08-05 VITALS — BP 138/78 | HR 74 | Temp 98.5°F | Ht 62.0 in | Wt 214.2 lb

## 2018-08-05 DIAGNOSIS — I10 Essential (primary) hypertension: Secondary | ICD-10-CM

## 2018-08-05 DIAGNOSIS — R7989 Other specified abnormal findings of blood chemistry: Secondary | ICD-10-CM | POA: Diagnosis not present

## 2018-08-05 DIAGNOSIS — E785 Hyperlipidemia, unspecified: Secondary | ICD-10-CM | POA: Diagnosis not present

## 2018-08-05 MED ORDER — SIMVASTATIN 20 MG PO TABS: ORAL_TABLET | ORAL | 1 refills | Status: DC

## 2018-08-05 NOTE — Progress Notes (Signed)
Subjective:  By signing my name below, I, Delton Coombes, attest that this documentation has been prepared under the direction and in the presence of Wendie Agreste, MD Electronically Signed: Delton Coombes Medical Scribe 08/05/2018   Patient ID: Patricia Wu, female    DOB: [birth year], 62 y.o.   MRN: [id number] Chief Complaint  Patient presents with  . lab work follow up    1 m f/u     HPI Patricia Wu is a 62 y.o. female who presents to Primary Care at [facility] here for a follow up, last saw her in July.  HTN Controlled at last visit.  BP Readings from Last 3 Encounters:  08/05/18 138/78  07/01/18 130/70  04/29/18 124/70   Lab Results  Component Value Date   CREATININE 1.86 (H) 07/05/2018   She had a previously elevated BNP, thought to be HTN not CHF. She reports that blood pressure has been doing well, her ranges were 130's/70's.  No new side effects of meds.   HLD Lab Results  Component Value Date   CHOL 178 07/05/2018   HDL 47 07/05/2018   LDLCALC 115 (H) 07/05/2018   TRIG 82 07/05/2018   CHOLHDL 3.8 07/05/2018   Lab Results  Component Value Date   ALT 9 07/05/2018   AST 9 07/05/2018   ALKPHOS 157 (H) 07/05/2018   BILITOT 0.3 07/05/2018   She had not had testing or medicine, fasting labs on July 13th. Episodic myalgia, normal CPK. She continues to take Zocor, and she reports doing good with it and denies having any muscle spasms.   Elevated creatinine Lab Results  Component Value Date   CREATININE 1.86 (H) 07/05/2018   GFR-33 Previous range of 1.56 to 1.78 since February. Her nephrologist is Dr. Carolynne Edouard and was put on Sodium Bicarbonate 650 mg BID on July 31st.   Patient Active Problem List   Diagnosis Date Noted  . Essential hypertension 02/18/2018  . Elevated brain natriuretic peptide (BNP) level 02/18/2018  . CKD (chronic kidney disease) stage 3, GFR 30-59 ml/min ([place]) 02/18/2018   Past Medical History:  Diagnosis Date  .  Hypercholesteremia   . Hypertension   . Noncompliance with medication regimen    No past surgical history on file. Allergies  Allergen Reactions  . Ibuprofen Other (See Comments)    pcp said raises her BP   Prior to Admission medications   Medication Sig Start Date End Date Taking? Authorizing Provider  amLODipine (NORVASC) 10 MG tablet TAKE 1 TABLET(10 MG) BY MOUTH DAILY 06/27/18  Yes Wendie Agreste, MD  lisinopril-hydrochlorothiazide (PRINZIDE,ZESTORETIC) 20-12.5 MG tablet TAKE 1 TABLET BY MOUTH DAILY 06/27/18  Yes Wendie Agreste, MD  simvastatin (ZOCOR) 10 MG tablet TAKE 1 TABLET(10 MG) BY MOUTH DAILY 06/27/18  Yes Wendie Agreste, MD  sodium bicarbonate 650 MG tablet TK 1 T PO BID 07/23/18  Yes [provider]  albuterol (PROVENTIL HFA;VENTOLIN HFA) 108 (90 Base) MCG/ACT inhaler Inhale 1-2 puffs into the lungs every 4 (four) hours as needed for wheezing or shortness of breath. Patient not taking: Reported on 08/05/2018 02/01/18   Wendie Agreste, MD   Social History   Socioeconomic History  . Marital status: Married    Spouse name: Not on file  . Number of children: 2  . Years of education: Not on file  . Highest education level: Not on file  Occupational History  . Not on file  Social Needs  . Financial resource strain: Not  on file  . Food insecurity:    Worry: Not on file    Inability: Not on file  . Transportation needs:    Medical: Not on file    Non-medical: Not on file  Tobacco Use  . Smoking status: Never Smoker  . Smokeless tobacco: Never Used  Substance and Sexual Activity  . Alcohol use: No  . Drug use: No  . Sexual activity: Yes  Lifestyle  . Physical activity:    Days per week: Not on file    Minutes per session: Not on file  . Stress: Not on file  Relationships  . Social connections:    Talks on phone: Not on file    Gets together: Not on file    Attends religious service: Not on file    Active member of club or organization: Not on file     Attends meetings of clubs or organizations: Not on file    Relationship status: Not on file  . Intimate partner violence:    Fear of current or ex partner: Not on file    Emotionally abused: Not on file    Physically abused: Not on file    Forced sexual activity: Not on file  Other Topics Concern  . Not on file  Social History Narrative  . Not on file    Review of Systems  Constitutional: Negative for fatigue and unexpected weight change.  Respiratory: Negative for chest tightness and shortness of breath.   Cardiovascular: Negative for chest pain, palpitations and leg swelling.  Gastrointestinal: Negative for abdominal pain and blood in stool.  Neurological: Negative for dizziness, syncope, light-headedness and headaches.       Objective:   Physical Exam  Constitutional: She is oriented to person, place, and time. She appears well-developed and well-nourished.  HENT:  Head: Normocephalic and atraumatic.  Eyes: Pupils are equal, round, and reactive to light. Conjunctivae and EOM are normal.  Neck: Carotid bruit is not present.  Cardiovascular: Normal rate, regular rhythm, normal heart sounds and intact distal pulses.  Pulmonary/Chest: Effort normal and breath sounds normal.  Abdominal: Soft. She exhibits no pulsatile midline mass. There is no tenderness.  Neurological: She is alert and oriented to person, place, and time.  Skin: Skin is warm and dry.  Psychiatric: She has a normal mood and affect. Her behavior is normal.  Vitals reviewed.  Vitals:   08/05/18 1622  BP: 138/78  Pulse: 74  Temp: 98.5 F (36.9 C)  TempSrc: Oral  SpO2: 99%  Weight: 214 lb 3.2 oz (97.2 kg)  Height: 5\' 2"  (1.575 m)      Assessment & Plan:   Patricia Wu is a 62 y.o. female Elevated serum creatinine - Plan: Comprehensive metabolic panel  - repeat labs, but keep appt with nephrology as planned.   Hyperlipidemia, unspecified hyperlipidemia type - Plan: simvastatin (ZOCOR) 20 MG  tablet, Lipid panel  - increase zocor to 20mg  qd, check lipids. Option of return to lower dose if side effects/intlerace at 20mg .   Essential hypertension - Plan: Comprehensive metabolic panel  - stable, no med changes, and keep follow up with nephrology as planned.   Meds ordered this encounter  Medications  . simvastatin (ZOCOR) 20 MG tablet    Sig: TAKE 1 TABLET(10 MG) BY MOUTH DAILY    Dispense:  90 tablet    Refill:  1   Patient Instructions   Keep appointment with nephrologist as planned next week.    Return in 6  weeks for lab only visit.  I did send a higher dose of cholesterol medication to your pharmacy.  If you have new side effects on that dose, and can return to the 10 mg dose, but I think that might be better treatment for your cholesterol levels.  Thank you for coming in today. Follow-up with me in 6 months for routine follow-up, but I am happy to see you sooner.  I will contact you with your lab results within the next 2 weeks.  If you have not heard from [place] then please contact us. The fastest way to get your results is to register for My Chart.   IF you received an x-ray today, you will receive an invoice from [facility]. Please contact [facility] at [phone] with questions or concerns regarding your invoice.   IF you received labwork today, you will receive an invoice from [place]. Please contact [facility] at [phone] with questions or concerns regarding your invoice.   Our billing staff will not be able to assist you with questions regarding bills from these companies.  You will be contacted with the lab results as soon as they are available. The fastest way to get your results is to activate your My Chart account. Instructions are located on the last page of this paperwork. If you have not heard from [place] regarding the results in 2 weeks, please contact this office.       I personally performed the services described in this  documentation, which was scribed in my presence. The recorded information has been reviewed and considered for accuracy and completeness, addended by me as needed, and agree with information above.  Signed,   Merri Ray, MD [facility] at [place].  08/17/18 1:43 PM

## 2018-08-05 NOTE — Patient Instructions (Addendum)
Keep appointment with nephrologist as planned next week.    Return in 6 weeks for lab only visit.  I did send a higher dose of cholesterol medication to your pharmacy.  If you have new side effects on that dose, and can return to the 10 mg dose, but I think that might be better treatment for your cholesterol levels.  Thank you for coming in today. Follow-up with me in 6 months for routine follow-up, but I am happy to see you sooner.  I will contact you with your lab results within the next 2 weeks.  If you have not heard from [place] then please contact us. The fastest way to get your results is to register for My Chart.   IF you received an x-ray today, you will receive an invoice from [facility]. Please contact [facility] at [phone] with questions or concerns regarding your invoice.   IF you received labwork today, you will receive an invoice from [place]. Please contact [facility] at [phone] with questions or concerns regarding your invoice.   Our billing staff will not be able to assist you with questions regarding bills from these companies.  You will be contacted with the lab results as soon as they are available. The fastest way to get your results is to activate your My Chart account. Instructions are located on the last page of this paperwork. If you have not heard from [place] regarding the results in 2 weeks, please contact this office.

## 2018-08-11 ENCOUNTER — Other Ambulatory Visit: Payer: Self-pay | Admitting: Family Medicine

## 2018-08-11 DIAGNOSIS — I10 Essential (primary) hypertension: Secondary | ICD-10-CM

## 2018-08-12 DIAGNOSIS — I129 Hypertensive chronic kidney disease with stage 1 through stage 4 chronic kidney disease, or unspecified chronic kidney disease: Secondary | ICD-10-CM | POA: Diagnosis not present

## 2018-08-12 DIAGNOSIS — N183 Chronic kidney disease, stage 3 (moderate): Secondary | ICD-10-CM | POA: Diagnosis not present

## 2018-08-17 ENCOUNTER — Encounter: Payer: Self-pay | Admitting: Family Medicine

## 2018-09-20 ENCOUNTER — Other Ambulatory Visit: Payer: Self-pay | Admitting: Family Medicine

## 2018-09-20 DIAGNOSIS — E785 Hyperlipidemia, unspecified: Secondary | ICD-10-CM

## 2018-09-20 DIAGNOSIS — I10 Essential (primary) hypertension: Secondary | ICD-10-CM

## 2018-09-22 NOTE — Telephone Encounter (Signed)
Amlodipine 10  refill Last Refill:08/11/18 # 30 Last OV: 08/05/18 PCP: Jacklyn Shell Pharmacy:[facility] # [phone]  Lisinopril-hctz 20/12.5  refill Last Refill:08/11/18 # 30 Last OV: 08/05/18 PCP: [place]: [facility] [phone]  Simvastatin 10 mg  refill Last Refill:08/05/18 # 90 and one refill  Last OV: 08/05/18 PCP: [place]: [facility] # [phone] Refill too soon for this one.

## 2018-11-03 ENCOUNTER — Other Ambulatory Visit: Payer: Self-pay | Admitting: Family Medicine

## 2018-11-03 DIAGNOSIS — I10 Essential (primary) hypertension: Secondary | ICD-10-CM

## 2018-11-04 NOTE — Telephone Encounter (Signed)
Spoke with [place] at [facility] to verify if prescriptions for amlodopine and lisinopril-hctz had been filled; she says that the amlodopine 10 mg was last filled on 09/12/18 and a refill for lisinopril-hydrochlorothizide had been faxed but they have not received a response; will refill medications per protocol.  Requested Prescriptions  Pending Prescriptions Disp Refills  . lisinopril-hydrochlorothiazide (PRINZIDE,ZESTORETIC) 20-12.5 MG tablet [Pharmacy Med Name: LISINOPRIL-HCTZ 20/12.5MG  TABLETS] 90 tablet 0    Sig: TAKE 1 TABLET BY MOUTH EVERY DAY     Cardiovascular:  ACEI + Diuretic Combos Failed - 11/03/2018  5:56 PM      Failed - Cr in normal range and within 180 days    Creat  Date Value Ref Range Status  05/05/2014 1.14 (H) 0.50 - 1.10 mg/dL Final   Creatinine, Ser  Date Value Ref Range Status  07/05/2018 1.86 (H) 0.57 - 1.00 mg/dL Final         Passed - Na in normal range and within 180 days    Sodium  Date Value Ref Range Status  07/05/2018 142 134 - 144 mmol/L Final         Passed - K in normal range and within 180 days    Potassium  Date Value Ref Range Status  07/05/2018 4.2 3.5 - 5.2 mmol/L Final         Passed - Ca in normal range and within 180 days    Calcium  Date Value Ref Range Status  07/05/2018 9.5 8.7 - 10.3 mg/dL Final         Passed - Patient is not pregnant      Passed - Last BP in normal range    BP Readings from Last 1 Encounters:  08/05/18 138/78         Passed - Valid encounter within last 6 months    Recent Outpatient Visits          3 months ago Elevated serum creatinine   [facility] [place], Ranell Patrick, MD   4 months ago Myalgia   [facility] [place], Ranell Patrick, MD   4 months ago Essential hypertension   [facility] [place], Ranell Patrick, MD   7 months ago Acute upper respiratory infection   [facility] [place], Ranell Patrick, MD   8 months ago CKD (chronic kidney disease) stage 3, GFR  30-59 ml/min [facility])   [facility] at [place], Ranell Patrick, MD      Future Appointments            In 1 month Wendie Agreste, MD [facility] at [place], [place]         . amLODipine ([place]) 10 MG tablet [Pharmacy Med Name: AMLODIPINE BESYLATE 10MG  TABLETS] 90 tablet 0    Sig: TAKE 1 TABLET BY MOUTH EVERY DAY     Cardiovascular:  Calcium Channel Blockers Passed - 11/03/2018  5:56 PM      Passed - Last BP in normal range    BP Readings from Last 1 Encounters:  08/05/18 138/78         Passed - Valid encounter within last 6 months    Recent Outpatient Visits          3 months ago Elevated serum creatinine   [facility] [place], Ranell Patrick, MD   4 months ago Myalgia   [facility] [place], Ranell Patrick, MD   4 months ago Essential hypertension  [facility], [place]   7 months ago Acute upper respiratory infection   [facility] [place], Ranell Patrick, MD   8 months ago CKD (chronic kidney disease) stage 3, GFR 30-59 ml/min [facility])   [facility] at [place], Ranell Patrick, MD      Future Appointments            In 1 month Carlota Raspberry Ranell Patrick, MD [facility] at [place], [facility]

## 2018-11-21 ENCOUNTER — Telehealth: Payer: Self-pay | Admitting: Family Medicine

## 2018-11-21 NOTE — Telephone Encounter (Signed)
LVM for pt to call the office and reschedule their appt on 12/30/18 with Dr. Carlota Raspberry. When pt calls back, please schedule at their convenience.

## 2018-12-30 ENCOUNTER — Ambulatory Visit: Payer: 59 | Admitting: Family Medicine

## 2019-02-25 DIAGNOSIS — H04123 Dry eye syndrome of bilateral lacrimal glands: Secondary | ICD-10-CM | POA: Diagnosis not present

## 2019-02-25 DIAGNOSIS — H40033 Anatomical narrow angle, bilateral: Secondary | ICD-10-CM | POA: Diagnosis not present

## 2019-05-12 ENCOUNTER — Other Ambulatory Visit: Payer: Self-pay | Admitting: Family Medicine

## 2019-05-12 DIAGNOSIS — I10 Essential (primary) hypertension: Secondary | ICD-10-CM

## 2019-05-12 NOTE — Telephone Encounter (Signed)
Requested medication (s) are due for refill today: yes  Requested medication (s) are on the active medication list: yes  Last refill:  Amlodipine: 11/04/18      Lisinopril-HCTZ:11/04/18  Future visit scheduled: no  Notes to clinic:  Pt >3 months overdue for OV. Called pt and tried to transfer but no answer. Please call pt to set up virtual visit   Requested Prescriptions  Pending Prescriptions Disp Refills   amLODipine (NORVASC) 10 MG tablet [Pharmacy Med Name: AMLODIPINE BESYLATE 10MG  TABLETS] 90 tablet 0    Sig: TAKE 1 TABLET BY MOUTH EVERY DAY     Cardiovascular:  Calcium Channel Blockers Failed - 05/12/2019  5:16 PM      Failed - Valid encounter within last 6 months    Recent Outpatient Visits          9 months ago Elevated serum creatinine   [facility] [place], Ranell Patrick, MD   10 months ago Myalgia   [facility] [place], Ranell Patrick, MD   10 months ago Essential hypertension   [facility] [place], Ranell Patrick, MD   1 year ago Acute upper respiratory infection   [facility] [place], Ranell Patrick, MD   1 year ago CKD (chronic kidney disease) stage 3, GFR 30-59 ml/min ([place])   [facility] at [place], Ranell Patrick, MD             Passed - Last BP in normal range    BP Readings from Last 1 Encounters:  08/05/18 138/78        lisinopril-hydrochlorothiazide (ZESTORETIC) 20-12.5 MG tablet [Pharmacy Med Name: LISINOPRIL-HCTZ 20/12.5MG  TABLETS] 90 tablet 0    Sig: TAKE 1 TABLET BY MOUTH EVERY DAY     Cardiovascular:  ACEI + Diuretic Combos Failed - 05/12/2019  5:16 PM      Failed - Na in normal range and within 180 days    Sodium  Date Value Ref Range Status  07/05/2018 142 134 - 144 mmol/L Final         Failed - K in normal range and within 180 days    Potassium  Date Value Ref Range Status  07/05/2018 4.2 3.5 - 5.2 mmol/L Final         Failed - Cr in normal range and within 180 days    Creat  Date Value Ref Range Status   05/05/2014 1.14 (H) 0.50 - 1.10 mg/dL Final   Creatinine, Ser  Date Value Ref Range Status  07/05/2018 1.86 (H) 0.57 - 1.00 mg/dL Final         Failed - Ca in normal range and within 180 days    Calcium  Date Value Ref Range Status  07/05/2018 9.5 8.7 - 10.3 mg/dL Final         Failed - Valid encounter within last 6 months    Recent Outpatient Visits          9 months ago Elevated serum creatinine   [facility] [place], Ranell Patrick, MD   10 months ago Myalgia   [facility] [place], Ranell Patrick, MD   10 months ago Essential hypertension   [facility] [place], Ranell Patrick, MD   1 year ago Acute upper respiratory infection   [facility] [place], Ranell Patrick, MD   1 year ago CKD (chronic kidney disease) stage 3, GFR 30-59 ml/min [facility])   [facility] at [place],  Ranell Patrick, MD             Passed - Patient is not pregnant      Passed - Last BP in normal range    BP Readings from Last 1 Encounters:  08/05/18 138/78

## 2019-05-20 ENCOUNTER — Telehealth: Payer: Self-pay | Admitting: Family Medicine

## 2019-05-20 ENCOUNTER — Other Ambulatory Visit: Payer: Self-pay

## 2019-05-20 DIAGNOSIS — I10 Essential (primary) hypertension: Secondary | ICD-10-CM

## 2019-05-20 MED ORDER — LISINOPRIL-HYDROCHLOROTHIAZIDE 20-12.5 MG PO TABS: 1.0000 | ORAL_TABLET | Freq: Every day | ORAL | 0 refills | Status: DC

## 2019-05-20 MED ORDER — AMLODIPINE BESYLATE 10 MG PO TABS: 10.0000 mg | ORAL_TABLET | Freq: Every day | ORAL | 0 refills | Status: DC

## 2019-05-20 NOTE — Telephone Encounter (Signed)
Copied from [place] [id number]. Topic: Quick Communication - Rx Refill/Question >> May 20, 2019 11:51 AM Leward Quan A wrote: Medication: amLODipine (NORVASC) 10 MG tablet, lisinopril-hydrochlorothiazide (PRINZIDE,ZESTORETIC) 20-12.5 MG tablet   Has the patient contacted their pharmacy? Yes.   (Agent: If no, request that the patient contact the pharmacy for the refill.) (Agent: If yes, when and what did the pharmacy advise?)  Preferred Pharmacy (with phone number or street name): [facility] [place], [place]. [address] [phone] (Phone) [phone] (Fax)    Agent: Please be advised that RX refills may take up to 3 business days. We ask that you follow-up with your pharmacy.

## 2019-05-20 NOTE — Telephone Encounter (Signed)
Rx has been sent for 30 days and advised to schedule an OV.

## 2019-06-17 ENCOUNTER — Other Ambulatory Visit: Payer: Self-pay | Admitting: Family Medicine

## 2019-06-17 DIAGNOSIS — I10 Essential (primary) hypertension: Secondary | ICD-10-CM

## 2019-06-17 DIAGNOSIS — E785 Hyperlipidemia, unspecified: Secondary | ICD-10-CM

## 2019-06-25 ENCOUNTER — Other Ambulatory Visit: Payer: Self-pay | Admitting: Family Medicine

## 2019-06-25 DIAGNOSIS — I10 Essential (primary) hypertension: Secondary | ICD-10-CM

## 2019-07-16 ENCOUNTER — Telehealth: Payer: Self-pay | Admitting: Family Medicine

## 2019-07-16 NOTE — Telephone Encounter (Signed)
Medication Refill - Medication:   amLODipine (NORVASC) 10 MG tablet    lisinopril-hydrochlorothiazide (ZESTORETIC) 20-12.5 MG tablet    simvastatin (ZOCOR) 20 MG tablet     Preferred Pharmacy :  [place], [place]. [address] [phone] (Phone) [phone] (Fax)     Pt was advised that RX refills may take up to 3 business days. We ask that you follow-up with your pharmacy.

## 2019-07-21 NOTE — Telephone Encounter (Signed)
Pt calling to check on status of refills/ please advise when sent to pharmacy

## 2019-07-22 ENCOUNTER — Other Ambulatory Visit: Payer: Self-pay

## 2019-07-22 DIAGNOSIS — I10 Essential (primary) hypertension: Secondary | ICD-10-CM

## 2019-07-22 DIAGNOSIS — E785 Hyperlipidemia, unspecified: Secondary | ICD-10-CM

## 2019-07-22 MED ORDER — AMLODIPINE BESYLATE 10 MG PO TABS: 10.0000 mg | ORAL_TABLET | Freq: Every day | ORAL | 0 refills | Status: DC

## 2019-07-22 MED ORDER — SIMVASTATIN 20 MG PO TABS: ORAL_TABLET | ORAL | 0 refills | Status: DC

## 2019-07-22 MED ORDER — LISINOPRIL-HYDROCHLOROTHIAZIDE 20-12.5 MG PO TABS: 1.0000 | ORAL_TABLET | Freq: Every day | ORAL | 0 refills | Status: DC

## 2019-07-22 NOTE — Telephone Encounter (Signed)
Rx sent to pharmacy   

## 2019-08-10 ENCOUNTER — Other Ambulatory Visit (HOSPITAL_COMMUNITY)
Admission: RE | Admit: 2019-08-10 | Discharge: 2019-08-10 | Disposition: A | Discharge status: Home / Self Care | Payer: 59 | Source: Ambulatory Visit | Attending: Family Medicine | Admitting: Family Medicine

## 2019-08-10 ENCOUNTER — Encounter: Payer: Self-pay | Admitting: Family Medicine

## 2019-08-10 ENCOUNTER — Other Ambulatory Visit: Payer: Self-pay

## 2019-08-10 ENCOUNTER — Ambulatory Visit (INDEPENDENT_AMBULATORY_CARE_PROVIDER_SITE_OTHER): Payer: 59 | Admitting: Family Medicine

## 2019-08-10 VITALS — BP 147/80 | HR 77 | Temp 98.2°F | Resp 14 | Wt 231.6 lb

## 2019-08-10 DIAGNOSIS — Z0001 Encounter for general adult medical examination with abnormal findings: Secondary | ICD-10-CM

## 2019-08-10 DIAGNOSIS — Z1211 Encounter for screening for malignant neoplasm of colon: Secondary | ICD-10-CM | POA: Diagnosis not present

## 2019-08-10 DIAGNOSIS — I10 Essential (primary) hypertension: Secondary | ICD-10-CM

## 2019-08-10 DIAGNOSIS — Z1239 Encounter for other screening for malignant neoplasm of breast: Secondary | ICD-10-CM | POA: Diagnosis not present

## 2019-08-10 DIAGNOSIS — Z124 Encounter for screening for malignant neoplasm of cervix: Secondary | ICD-10-CM | POA: Insufficient documentation

## 2019-08-10 DIAGNOSIS — N183 Chronic kidney disease, stage 3 unspecified: Secondary | ICD-10-CM

## 2019-08-10 DIAGNOSIS — Z23 Encounter for immunization: Secondary | ICD-10-CM

## 2019-08-10 DIAGNOSIS — Z0000 Encounter for general adult medical examination without abnormal findings: Secondary | ICD-10-CM

## 2019-08-10 DIAGNOSIS — E785 Hyperlipidemia, unspecified: Secondary | ICD-10-CM

## 2019-08-10 MED ORDER — AMLODIPINE BESYLATE 10 MG PO TABS: 10.0000 mg | ORAL_TABLET | Freq: Every day | ORAL | 2 refills | Status: DC

## 2019-08-10 MED ORDER — SHINGRIX 50 MCG/0.5ML IM SUSR: 0.5000 mL | Freq: Once | INTRAMUSCULAR | 1 refills | Status: AC

## 2019-08-10 MED ORDER — SIMVASTATIN 20 MG PO TABS: ORAL_TABLET | ORAL | 2 refills | Status: DC

## 2019-08-10 MED ORDER — LISINOPRIL-HYDROCHLOROTHIAZIDE 20-12.5 MG PO TABS: 1.0000 | ORAL_TABLET | Freq: Every day | ORAL | 2 refills | Status: DC

## 2019-08-10 NOTE — Progress Notes (Signed)
Subjective:    Patient ID: Patricia Wu, female    DOB: 1956-12-02, 64 y.o.   MRN: [id number]  HPI Patricia Wu is a 63 y.o. female Presents today for: Chief Complaint  Patient presents with  . Annual Exam    here for her annual exam with no other issus   Hypertension: BP Readings from Last 3 Encounters:  08/10/19 (!) 147/80  08/05/18 138/78  07/01/18 130/70   Lab Results  Component Value Date   CREATININE 1.86 (H) 07/05/2018  Previous elevated BNP thought to be due to hypertension not CHF.  BP controlled when last evaluated in August 2019.  Has been on Zestoretic 20/12.5 mg daily, amlodipine 10 mg daily. By chronic kidney disease, stage III, 2014, thought to be due to hypertensive arteriosclerosis and possible analgesic nephropathy.  Nephrologist Dr. Carolin Sicks.  Started on sodium bicarbonate last year.  Ate some ham yesterday - thinks reason for elevation.  Usually controlled.   Hyperlipidemia:   Lab Results  Component Value Date   CHOL 178 07/05/2018   HDL 47 07/05/2018   LDLCALC 115 (H) 07/05/2018   TRIG 82 07/05/2018   CHOLHDL 3.8 07/05/2018   Lab Results  Component Value Date   ALT 9 07/05/2018   AST 9 07/05/2018   ALKPHOS 157 (H) 07/05/2018   BILITOT 0.3 07/05/2018  Previously on simvastatin 20 mg daily.  Last lab work 1 year ago. No new side effects, taking daily.   Cancer screening Colon: Due.  She was referred in 2018 to [facility]. Has not had done. Agrees to referral.  Mammogram: referred today - no recent testing.  Pap: unknown last test, overdue.  Agrees to testing today, prefers female provider.    Immunization History  Administered Date(s) Administered  . Influenza,inj,Quad PF,6+ Mos 02/01/2018  . Tdap 02/01/2018  shingrix - agrees to rx.    Depression screen [facility] 2/9 08/10/2019 08/05/2018 07/01/2018 03/18/2018 02/18/2018  Decreased Interest 0 0 0 0 0  Down, Depressed, Hopeless 0 0 0 0 0  PHQ - 2 Score 0 0 0 0 0    Hearing Screening   125Hz  250Hz  500Hz  1000Hz  2000Hz  3000Hz  4000Hz  6000Hz  8000Hz   Right ear:           Left ear:             Visual Acuity Screening   Right eye Left eye Both eyes  Without correction: 20/40 20/20 20/20   With correction:     regular optho care.   Dental: no recent appt.   Exercise. Most days per week usually - walking every other day for 1 hr.   Alcohol: none  Tobacco: none.   Patient Active Problem List   Diagnosis Date Noted  . Essential hypertension 02/18/2018  . Elevated brain natriuretic peptide (BNP) level 02/18/2018  . CKD (chronic kidney disease) stage 3, GFR 30-59 ml/min ([place]) 02/18/2018   Past Medical History:  Diagnosis Date  . Hypercholesteremia   . Hypertension   . Noncompliance with medication regimen    No past surgical history on file. Allergies  Allergen Reactions  . Ibuprofen Other (See Comments)    pcp said raises her BP   Prior to Admission medications   Medication Sig Start Date End Date Taking? Authorizing Provider  amLODipine (NORVASC) 10 MG tablet Take 1 tablet (10 mg total) by mouth daily. 07/22/19  Yes Wendie Agreste, MD  lisinopril-hydrochlorothiazide (ZESTORETIC) 20-12.5 MG tablet Take 1 tablet by mouth daily. 07/22/19  Yes  Wendie Agreste, MD  simvastatin (ZOCOR) 20 MG tablet TAKE 1 TABLET BY MOUTH EVERY DAY 07/22/19  Yes Wendie Agreste, MD  sodium bicarbonate 650 MG tablet TK 1 T PO BID 07/23/18  Yes [provider]   Social History   Socioeconomic History  . Marital status: Married    Spouse name: Not on file  . Number of children: 2  . Years of education: Not on file  . Highest education level: Not on file  Occupational History  . Not on file  Social Needs  . Financial resource strain: Not on file  . Food insecurity    Worry: Not on file    Inability: Not on file  . Transportation needs    Medical: Not on file    Non-medical: Not on file  Tobacco Use  . Smoking status: Never Smoker  .  Smokeless tobacco: Never Used  Substance and Sexual Activity  . Alcohol use: No  . Drug use: No  . Sexual activity: Yes  Lifestyle  . Physical activity    Days per week: Not on file    Minutes per session: Not on file  . Stress: Not on file  Relationships  . Social Herbalist on phone: Not on file    Gets together: Not on file    Attends religious service: Not on file    Active member of club or organization: Not on file    Attends meetings of clubs or organizations: Not on file    Relationship status: Not on file  . Intimate partner violence    Fear of current or ex partner: Not on file    Emotionally abused: Not on file    Physically abused: Not on file    Forced sexual activity: Not on file  Other Topics Concern  . Not on file  Social History Narrative  . Not on file    Review of Systems     Objective:   Physical Exam Constitutional:      Appearance: She is well-developed.  HENT:     Head: Normocephalic and atraumatic.     Right Ear: External ear normal.     Left Ear: External ear normal.  Eyes:     Conjunctiva/sclera: Conjunctivae normal.     Pupils: Pupils are equal, round, and reactive to light.  Neck:     Musculoskeletal: Normal range of motion and neck supple.     Thyroid: No thyromegaly.  Cardiovascular:     Rate and Rhythm: Normal rate and regular rhythm.     Heart sounds: Normal heart sounds. No murmur.  Pulmonary:     Effort: Pulmonary effort is normal. No respiratory distress.     Breath sounds: Normal breath sounds. No wheezing.  Abdominal:     General: Bowel sounds are normal.     Palpations: Abdomen is soft.     Tenderness: There is no abdominal tenderness.  Musculoskeletal: Normal range of motion.        General: No tenderness.  Lymphadenopathy:     Cervical: No cervical adenopathy.  Skin:    General: Skin is warm and dry.     Findings: No rash.  Neurological:     Mental Status: She is alert and oriented to person, place, and  time.  Psychiatric:        Behavior: Behavior normal.        Thought Content: Thought content normal.    Vitals:   08/10/19 1330 08/10/19  1340  BP: (!) 154/83 (!) 147/80  Pulse: 77   Resp: 14   Temp: 98.2 F (36.8 C)   TempSrc: Oral   SpO2: 99%   Weight: 231 lb 9.6 oz (105.1 kg)          Assessment & Plan:   Patricia Wu is a 63 y.o. female Annual physical exam  - -anticipatory guidance as below in AVS, screening labs above. Health maintenance items as above in HPI discussed/recommended as applicable.   Benign essential HTN - Plan: Comprehensive metabolic panel CKD (chronic kidney disease) stage 3, GFR 30-59 ml/min (HCC) - Plan: Comprehensive metabolic panel Essential hypertension - Plan: amLODipine (NORVASC) 10 MG tablet, lisinopril-hydrochlorothiazide (ZESTORETIC) 20-12.5 MG tablet  -Elevated in office, thought to be related to diet from yesterday.  Monitor home readings.  Continue routine follow-up with nephrologist, no med changes at present.  Screening for breast cancer - Plan: MM Digital Screening, breast exam as above without concerns  Screen for colon cancer - Plan: Ambulatory referral to Gastroenterology  Hyperlipidemia, unspecified hyperlipidemia type - Plan: Comprehensive metabolic panel, Lipid Panel, simvastatin (ZOCOR) 20 MG tablet  -Tolerating simvastatin, continue same.  Labs pending  Need for shingles vaccine - Plan: Zoster Vaccine Adjuvanted [facility]) injection sent to pharmacy  Screening for cervical cancer - Plan: Cytology - PAP([place])  -Pap testing completed as above by Dr. Nolon Rod.  Meds ordered this encounter  Medications  . Zoster Vaccine Adjuvanted [facility]) injection    Sig: Inject 0.5 mLs into the muscle once for 1 dose. Repeat in 2-6 months.    Dispense:  0.5 mL    Refill:  1  . simvastatin (ZOCOR) 20 MG tablet    Sig: TAKE 1 TABLET BY MOUTH EVERY DAY    Dispense:  90 tablet    Refill:  2  . amLODipine (NORVASC) 10 MG  tablet    Sig: Take 1 tablet (10 mg total) by mouth daily.    Dispense:  90 tablet    Refill:  2  . lisinopril-hydrochlorothiazide (ZESTORETIC) 20-12.5 MG tablet    Sig: Take 1 tablet by mouth daily.    Dispense:  90 tablet    Refill:  2   Patient Instructions   No change in medications today, but watch your blood pressures at home and if they remain over 140/90, we may need to make some adjustments holding be discussed with your nephrologist.  I referred you for mammogram and colonoscopy.  If you do not hear from them in the next 2 weeks, let me know.  Follow-up in 6 months for review of medications.  Let me know if there are questions in the meantime. Shingles vaccine sent to pharmacy.  Keeping You Healthy  Get These Tests  Blood Pressure- Have your blood pressure checked by your healthcare provider at least once a year.  Normal blood pressure is 120/80.  Weight- Have your body mass index (BMI) calculated to screen for obesity.  BMI is a measure of body fat based on height and weight.  You can calculate your own BMI at [employer].it  Cholesterol- Have your cholesterol checked every year.  Diabetes- Have your blood sugar checked every year if you have high blood pressure, high cholesterol, a family history of diabetes or if you are overweight.  Pap Test - Have a pap test every 1 to 5 years if you have been sexually active.  If you are older than 65 and recent pap tests have been normal you may not  need additional pap tests.  In addition, if you have had a hysterectomy  for benign disease additional pap tests are not necessary.  Mammogram-Yearly mammograms are essential for early detection of breast cancer  Screening for Colon Cancer- Colonoscopy starting at age 54. Screening may begin sooner depending on your family history and other health conditions.  Follow up colonoscopy as directed by your Gastroenterologist.  Screening for Osteoporosis- Screening begins at age  34 with bone density scanning, sooner if you are at higher risk for developing Osteoporosis.  Get these medicines  Calcium with Vitamin D- Your body requires 1200-1500 mg of Calcium a day and [phone] IU of Vitamin D a day.  You can only absorb 500 mg of Calcium at a time therefore Calcium must be taken in 2 or 3 separate doses throughout the day.  Hormones- Hormone therapy has been associated with increased risk for certain cancers and heart disease.  Talk to your healthcare provider about if you need relief from menopausal symptoms.  Aspirin- Ask your healthcare provider about taking Aspirin to prevent Heart Disease and Stroke.  Get these Immuniztions  Flu shot- Every fall  Pneumonia shot- Once after the age of 85; if you are younger ask your healthcare provider if you need a pneumonia shot.  Tetanus- Every ten years.  Zostavax- Once after the age of 29 to prevent shingles.  Take these steps  Don't smoke- Your healthcare provider can help you quit. For tips on how to quit, ask your healthcare provider or go to www.smokefree.gov or call 1-800 QUIT-NOW.  Be physically active- Exercise 5 days a week for a minimum of 30 minutes.  If you are not already physically active, start slow and gradually work up to 30 minutes of moderate physical activity.  Try walking, dancing, bike riding, swimming, etc.  Eat a healthy diet- Eat a variety of healthy foods such as fruits, vegetables, whole grains, low fat milk, low fat cheeses, yogurt, lean meats, chicken, fish, eggs, dried beans, tofu, etc.  For more information go to www.thenutritionsource.org  Dental visit- Brush and floss teeth twice daily; visit your dentist twice a year.  Eye exam- Visit your Optometrist or Ophthalmologist yearly.  Drink alcohol in moderation- Limit alcohol intake to one drink or less a day.  Never drink and drive.  Depression- Your emotional health is as important as your physical health.  If you're feeling down or  losing interest in things you normally enjoy, please talk to your healthcare provider.  Seat Belts- can save your life; always wear one  Smoke/Carbon Monoxide detectors- These detectors need to be installed on the appropriate level of your home.  Replace batteries at least once a year.  Violence- If anyone is threatening or hurting you, please tell your healthcare provider.  Living Will/ Health care power of attorney- Discuss with your healthcare provider and family.   If you have lab work done today you will be contacted with your lab results within the next 2 weeks.  If you have not heard from [place] then please contact us. The fastest way to get your results is to register for My Chart.   IF you received an x-ray today, you will receive an invoice from [facility]. Please contact [facility] at [phone] with questions or concerns regarding your invoice.   IF you received labwork today, you will receive an invoice from [place]. Please contact [facility] at [phone] with questions or concerns regarding your invoice.   Our billing staff will not  be able to assist you with questions regarding bills from these companies.  You will be contacted with the lab results as soon as they are available. The fastest way to get your results is to activate your My Chart account. Instructions are located on the last page of this paperwork. If you have not heard from [place] regarding the results in 2 weeks, please contact this office.        Signed,   Merri Ray, MD [facility].  08/11/19 12:37 PM

## 2019-08-10 NOTE — Progress Notes (Signed)
Breast exam and Pap smear performed by Dr. Nolon Rod with [facility]  Breast exam Symmetric breast, with normal nipples without discharge, without masses or skin changes  Vaginal exam Labia normal bilaterally without skin lesions Urethral meatus normal appearing without erythema Vagina without discharge No CMT, ovaries small and not palpable Uterus midline, nontender Pap smear performed and sent to lab

## 2019-08-10 NOTE — Patient Instructions (Addendum)
No change in medications today, but watch your blood pressures at home and if they remain over 140/90, we may need to make some adjustments holding be discussed with your nephrologist.  I referred you for mammogram and colonoscopy.  If you do not hear from them in the next 2 weeks, let me know.  Follow-up in 6 months for review of medications.  Let me know if there are questions in the meantime. Shingles vaccine sent to pharmacy.  Keeping You Healthy  Get These Tests  Blood Pressure- Have your blood pressure checked by your healthcare provider at least once a year.  Normal blood pressure is 120/80.  Weight- Have your body mass index (BMI) calculated to screen for obesity.  BMI is a measure of body fat based on height and weight.  You can calculate your own BMI at [employer]  Cholesterol- Have your cholesterol checked every year.  Diabetes- Have your blood sugar checked every year if you have high blood pressure, high cholesterol, a family history of diabetes or if you are overweight.  Pap Test - Have a pap test every 1 to 5 years if you have been sexually active.  If you are older than 65 and recent pap tests have been normal you may not need additional pap tests.  In addition, if you have had a hysterectomy  for benign disease additional pap tests are not necessary.  Mammogram-Yearly mammograms are essential for early detection of breast cancer  Screening for Colon Cancer- Colonoscopy starting at age 57. Screening may begin sooner depending on your family history and other health conditions.  Follow up colonoscopy as directed by your Gastroenterologist.  Screening for Osteoporosis- Screening begins at age 40 with bone density scanning, sooner if you are at higher risk for developing Osteoporosis.  Get these medicines  Calcium with Vitamin D- Your body requires 1200-1500 mg of Calcium a day and [phone] IU of Vitamin D a day.  You can only absorb 500 mg of Calcium at a  time therefore Calcium must be taken in 2 or 3 separate doses throughout the day.  Hormones- Hormone therapy has been associated with increased risk for certain cancers and heart disease.  Talk to your healthcare provider about if you need relief from menopausal symptoms.  Aspirin- Ask your healthcare provider about taking Aspirin to prevent Heart Disease and Stroke.  Get these Immuniztions  Flu shot- Every fall  Pneumonia shot- Once after the age of 35; if you are younger ask your healthcare provider if you need a pneumonia shot.  Tetanus- Every ten years.  Zostavax- Once after the age of 77 to prevent shingles.  Take these steps  Don't smoke- Your healthcare provider can help you quit. For tips on how to quit, ask your healthcare provider or go to www.smokefree.gov or call 1-800 QUIT-NOW.  Be physically active- Exercise 5 days a week for a minimum of 30 minutes.  If you are not already physically active, start slow and gradually work up to 30 minutes of moderate physical activity.  Try walking, dancing, bike riding, swimming, etc.  Eat a healthy diet- Eat a variety of healthy foods such as fruits, vegetables, whole grains, low fat milk, low fat cheeses, yogurt, lean meats, chicken, fish, eggs, dried beans, tofu, etc.  For more information go to www.thenutritionsource.org  Dental visit- Brush and floss teeth twice daily; visit your dentist twice a year.  Eye exam- Visit your Optometrist or Ophthalmologist yearly.  Drink alcohol in moderation- Limit alcohol intake  to one drink or less a day.  Never drink and drive.  Depression- Your emotional health is as important as your physical health.  If you're feeling down or losing interest in things you normally enjoy, please talk to your healthcare provider.  Seat Belts- can save your life; always wear one  Smoke/Carbon Monoxide detectors- These detectors need to be installed on the appropriate level of your home.  Replace batteries at  least once a year.  Violence- If anyone is threatening or hurting you, please tell your healthcare provider.  Living Will/ Health care power of attorney- Discuss with your healthcare provider and family.   If you have lab work done today you will be contacted with your lab results within the next 2 weeks.  If you have not heard from [place] then please contact us. The fastest way to get your results is to register for My Chart.   IF you received an x-ray today, you will receive an invoice from [facility]. Please contact [facility] at [phone] with questions or concerns regarding your invoice.   IF you received labwork today, you will receive an invoice from [place]. Please contact [facility] at [phone] with questions or concerns regarding your invoice.   Our billing staff will not be able to assist you with questions regarding bills from these companies.  You will be contacted with the lab results as soon as they are available. The fastest way to get your results is to activate your My Chart account. Instructions are located on the last page of this paperwork. If you have not heard from [place] regarding the results in 2 weeks, please contact this office.

## 2019-08-11 LAB — LIPID PANEL
Chol/HDL Ratio: 4.1 ratio (ref 0.0–4.4)
Cholesterol, Total: 217 mg/dL — ABNORMAL HIGH (ref 100–199)
HDL: 53 mg/dL (ref >39)
LDL Calculated: 142 mg/dL — ABNORMAL HIGH (ref 0–99)
Triglycerides: 108 mg/dL (ref 0–149)
VLDL Cholesterol Cal: 22 mg/dL (ref 5–40)

## 2019-08-11 LAB — CYTOLOGY - PAP: Diagnosis: NEGATIVE

## 2019-08-11 LAB — COMPREHENSIVE METABOLIC PANEL
ALT: 12 IU/L (ref 0–32)
AST: 11 IU/L (ref 0–40)
Albumin/Globulin Ratio: 1.3 (ref 1.2–2.2)
Albumin: 4.6 g/dL (ref 3.8–4.8)
Alkaline Phosphatase: 175 IU/L — ABNORMAL HIGH (ref 39–117)
BUN/Creatinine Ratio: 15 (ref 12–28)
BUN: 24 mg/dL (ref 8–27)
Bilirubin Total: 0.4 mg/dL (ref 0.0–1.2)
CO2: 23 mmol/L (ref 20–29)
Calcium: 9.8 mg/dL (ref 8.7–10.3)
Chloride: 106 mmol/L (ref 96–106)
Creatinine, Ser: 1.62 mg/dL — ABNORMAL HIGH (ref 0.57–1.00)
GFR calc Af Amer: 39 mL/min/{1.73_m2} — ABNORMAL LOW (ref >59)
GFR calc non Af Amer: 34 mL/min/{1.73_m2} — ABNORMAL LOW (ref >59)
Globulin, Total: 3.6 g/dL (ref 1.5–4.5)
Glucose: 93 mg/dL (ref 65–99)
Potassium: 4 mmol/L (ref 3.5–5.2)
Sodium: 146 mmol/L — ABNORMAL HIGH (ref 134–144)
Total Protein: 8.2 g/dL (ref 6.0–8.5)

## 2019-08-19 ENCOUNTER — Encounter: Payer: Self-pay | Admitting: Gastroenterology

## 2019-08-24 ENCOUNTER — Encounter: Payer: Self-pay | Admitting: Radiology

## 2019-09-15 ENCOUNTER — Ambulatory Visit (INDEPENDENT_AMBULATORY_CARE_PROVIDER_SITE_OTHER): Payer: Self-pay | Admitting: *Deleted

## 2019-09-15 ENCOUNTER — Other Ambulatory Visit: Payer: Self-pay

## 2019-09-15 DIAGNOSIS — Z1211 Encounter for screening for malignant neoplasm of colon: Secondary | ICD-10-CM

## 2019-09-15 MED ORDER — NA SULFATE-K SULFATE-MG SULF 17.5-3.13-1.6 GM/177ML PO SOLN: 1.0000 | Freq: Once | ORAL | 0 refills | Status: AC

## 2019-09-15 NOTE — Progress Notes (Addendum)
Gastroenterology Pre-Procedure Review  Request Date: 09/15/2019 Requesting Physician: Dr. Merri Ray @ [facility], Last TCS done many years ago, pt unsure of the date and physician, no polyps per pt  PATIENT REVIEW QUESTIONS: The patient responded to the following health history questions as indicated:    1. Diabetes Melitis: no 2. Joint replacements in the past 12 months: no 3. Major health problems in the past 3 months: no 4. Has an artificial valve or MVP: no 5. Has a defibrillator: no 6. Has been advised in past to take antibiotics in advance of a procedure like teeth cleaning: no 7. Family history of colon cancer: no  8. Alcohol Use: no 9. Illicit drug Use: no 10. History of sleep apnea: no  11. History of coronary artery or other vascular stents placed within the last 12 months: no 12. History of any prior anesthesia complications: no 13. There is no height or weight on file to calculate BMI. Ht: 5'3 Wt: 212 lbs    MEDICATIONS & ALLERGIES:    Patient reports the following regarding taking any blood thinners:   Plavix? no Aspirin? no Coumadin? no Brilinta? no Xarelto? no Eliquis? no Pradaxa? no Savaysa? no Effient? no  Patient confirms/reports the following medications:  Current Outpatient Medications  Medication Sig Dispense Refill  . acetaminophen (TYLENOL) 325 MG tablet Take 650 mg by mouth as needed.    Marland Kitchen amLODipine (NORVASC) 10 MG tablet Take 1 tablet (10 mg total) by mouth daily. 90 tablet 2  . lisinopril-hydrochlorothiazide (ZESTORETIC) 20-12.5 MG tablet Take 1 tablet by mouth daily. 90 tablet 2  . simvastatin (ZOCOR) 20 MG tablet TAKE 1 TABLET BY MOUTH EVERY DAY 90 tablet 2   No current facility-administered medications for this visit.     Patient confirms/reports the following allergies:  Allergies  Allergen Reactions  . Ibuprofen Other (See Comments)    pcp said raises her BP    No orders of the defined types were placed in this  encounter.   [place] INFORMATION Primary Insurance: [facility],  [place] #: [id number],  Group #: [id number] Pre-Cert / Josem Kaufmann required: Yes, approved per [facility] from 75/08/1637- 03/29/6598 Pre-Cert / Josem Kaufmann #: [id number]  SCHEDULE INFORMATION: Procedure has been scheduled as follows:  Date: 11/25/2019, Time: 9:30 Location: [facility] with Dr. Gala Romney  This Gastroenterology Pre-Precedure Review Form is being routed to the following provider(s): Walden Field, NP

## 2019-09-15 NOTE — Patient Instructions (Signed)
Patricia Wu  [birth year] MRN: [id number]     Procedure Date: 11/25/2019 Time to register: 8:30 am Place to register: [facility] Procedure Time: 9:30 am Scheduled provider: Dr. Gala Romney    PREPARATION FOR COLONOSCOPY WITH SUPREP BOWEL PREP KIT  Note: Suprep Bowel Prep Kit is a split-dose (2day) regimen. Consumption of BOTH 6-ounce bottles is required for a complete prep.  Please notify us immediately if you are diabetic, take iron supplements, or if you are on Coumadin or any other blood thinners.                                                                                                                                                  2 DAYS BEFORE PROCEDURE:  DATE: 11/23/2019   DAY: Monday Begin clear liquid diet AFTER your lunch meal. NO SOLID FOODS after this point.  1 DAY BEFORE PROCEDURE:  DATE: 11/24/2019   DAY: Tuesday Continue clear liquids the entire day - NO SOLID FOOD.    At 6:00pm: Complete steps 1 through 4 below, using ONE (1) 6-ounce bottle, before going to bed. Step 1:  Pour ONE (1) 6-ounce bottle of SUPREP liquid into the mixing container.  Step 2:  Add cool drinking water to the 16 ounce line on the container and mix.  Note: Dilute the solution concentrate as directed prior to use. Step 3:  DRINK ALL the liquid in the container. Step 4:  You MUST drink an additional two (2) or more 16 ounce containers of water over the next one (1) hour.   Continue clear liquids.  DAY OF PROCEDURE:   DATE: 11/25/2019   DAY: Wednesday If you take medications for your heart, blood pressure, or breathing, you may take these medications.   5 hours before your procedure at :  4:30 am Step 1:  Pour ONE (1) 6-ounce bottle of SUPREP liquid into the mixing container.  Step 2:  Add cool drinking water to the 16 ounce line on the container and mix.  Note: Dilute the solution concentrate as directed prior to use. Step 3:  DRINK ALL the liquid in the container. Step 4:   You MUST drink an additional two (2) or more 16 ounce containers of water over the next one (1) hour. You MUST complete the final glass of water at least 3 hours before your colonoscopy. Nothing by mouth past 6:30 am  You may take your morning medications with sip of water unless we have instructed otherwise.    Please see below for Dietary Information.  CLEAR LIQUIDS INCLUDE:  Water Jello (NOT red in color)   Ice Popsicles (NOT red in color)   Tea (sugar ok, no milk/cream) Powdered fruit flavored drinks  Coffee (sugar ok, no milk/cream) Gatorade/ Lemonade/ Kool-Aid  (NOT red in color)   Juice: apple, white grape, white cranberry  Soft drinks  Clear bullion, consomme, broth (fat free beef/chicken/vegetable)  Carbonated beverages (any kind)  Strained chicken noodle soup Hard Candy   Remember: Clear liquids are liquids that will allow you to see your fingers on the other side of a clear glass. Be sure liquids are NOT red in color, and not cloudy, but CLEAR.  DO NOT EAT OR DRINK ANY OF THE FOLLOWING:  Dairy products of any kind   Cranberry juice Tomato juice / V8 juice   Grapefruit juice Orange juice     Red grape juice  Do not eat any solid foods, including such foods as: cereal, oatmeal, yogurt, fruits, vegetables, creamed soups, eggs, bread, crackers, pureed foods in a blender, etc.   HELPFUL HINTS FOR DRINKING PREP SOLUTION:   Make sure prep is extremely cold. Mix and refrigerate the the morning of the prep. You may also put in the freezer.   You may try mixing some Crystal Light or Country Time Lemonade if you prefer. Mix in small amounts; add more if necessary.  Try drinking through a straw  Rinse mouth with water or a mouthwash between glasses, to remove after-taste.  Try sipping on a cold beverage /ice/ popsicles between glasses of prep.  Place a piece of sugar-free hard candy in mouth between glasses.  If you become nauseated, try consuming smaller amounts, or stretch  out the time between glasses. Stop for 30-60 minutes, then slowly start back drinking.     OTHER INSTRUCTIONS  You will need a responsible adult at least 63 years of age to accompany you and drive you home. This person must remain in the waiting room during your procedure. The hospital will cancel your procedure if you do not have a responsible adult with you.   1. Wear loose fitting clothing that is easily removed. 2. Leave jewelry and other valuables at home.  3. Remove all body piercing jewelry and leave at home. 4. Total time from sign-in until discharge is approximately 2-3 hours. 5. You should go home directly after your procedure and rest. You can resume normal activities the day after your procedure. 6. The day of your procedure you should not:  Drive  Make legal decisions  Operate machinery  Drink alcohol  Return to work   You may call the office (Dept: [phone]) before 5:00pm, or page the doctor on call [phone]) after 5:00pm, for further instructions, if necessary.   Insurance Information YOU WILL NEED TO CHECK WITH YOUR INSURANCE COMPANY FOR THE BENEFITS OF COVERAGE YOU HAVE FOR THIS PROCEDURE.  UNFORTUNATELY, NOT ALL INSURANCE COMPANIES HAVE BENEFITS TO COVER ALL OR PART OF THESE TYPES OF PROCEDURES.  IT IS YOUR RESPONSIBILITY TO CHECK YOUR BENEFITS, HOWEVER, WE WILL BE GLAD TO ASSIST YOU WITH ANY CODES YOUR INSURANCE COMPANY MAY NEED.    PLEASE NOTE THAT MOST INSURANCE COMPANIES WILL NOT COVER A SCREENING COLONOSCOPY FOR PEOPLE UNDER THE AGE OF 50  IF YOU HAVE BCBS INSURANCE, YOU MAY HAVE BENEFITS FOR A SCREENING COLONOSCOPY BUT IF POLYPS ARE FOUND THE DIAGNOSIS WILL CHANGE AND THEN YOU MAY HAVE A DEDUCTIBLE THAT WILL NEED TO BE MET. SO PLEASE MAKE SURE YOU CHECK YOUR BENEFITS FOR A SCREENING COLONOSCOPY AS WELL AS A DIAGNOSTIC COLONOSCOPY.

## 2019-09-16 NOTE — Progress Notes (Signed)
Ok to schedule.

## 2019-09-17 NOTE — Addendum Note (Signed)
Addended by: Metro Kung on: 09/17/2019 01:36 PM   Modules accepted: Orders, SmartSet

## 2019-11-23 ENCOUNTER — Other Ambulatory Visit: Payer: Self-pay

## 2019-11-23 ENCOUNTER — Other Ambulatory Visit (HOSPITAL_COMMUNITY)
Admission: RE | Admit: 2019-11-23 | Discharge: 2019-11-23 | Disposition: A | Discharge status: Home / Self Care | Payer: 59 | Source: Ambulatory Visit | Attending: Internal Medicine | Admitting: Internal Medicine

## 2019-11-23 DIAGNOSIS — U071 COVID-19: Secondary | ICD-10-CM | POA: Insufficient documentation

## 2019-11-23 DIAGNOSIS — Z01812 Encounter for preprocedural laboratory examination: Secondary | ICD-10-CM | POA: Insufficient documentation

## 2019-11-23 LAB — SARS CORONAVIRUS 2 (TAT 6-24 HRS): SARS Coronavirus 2: POSITIVE — AB

## 2019-11-24 ENCOUNTER — Telehealth: Payer: Self-pay | Admitting: *Deleted

## 2019-11-24 ENCOUNTER — Telehealth: Payer: Self-pay | Admitting: Nurse Practitioner

## 2019-11-24 MED ORDER — KETAMINE HCL 50 MG/5ML IJ SOSY
PREFILLED_SYRINGE | INTRAMUSCULAR | Status: AC
  Filled 2019-11-24: qty 5

## 2019-11-24 NOTE — Telephone Encounter (Signed)
Called to Discuss with patient about Covid symptoms and the use of bamlanivimab, a monoclonal antibody infusion for those with mild to moderate Covid symptoms and at a high risk of hospitalization.     Pt is qualified for this infusion at the [facility] due to co-morbid conditions and/or a member of an at-risk group.     Unable to reach pt  

## 2019-11-24 NOTE — Telephone Encounter (Signed)
Left vm for pt to call our office ASAP.

## 2019-11-24 NOTE — Telephone Encounter (Signed)
Pt called back and I informed her that her Covid test came back positive.  Advised pt to quarantine and contact her PCP ASAP today.  Offered to reschedule her procedure to Feb/ Mar (first available) as well but pt declined for now.  She said that she would like to call us back.

## 2019-11-24 NOTE — OR Nursing (Signed)
Durward Fortes at Dr. Roseanne Kaufman office notified of patient being Covid positive. Angie said she would notify Dr. Gala Romney. Procedure cancelled for tomorrow.

## 2019-11-25 ENCOUNTER — Telehealth (INDEPENDENT_AMBULATORY_CARE_PROVIDER_SITE_OTHER): Payer: 59 | Admitting: Family Medicine

## 2019-11-25 ENCOUNTER — Ambulatory Visit (HOSPITAL_COMMUNITY): Admission: RE | Admit: 2019-11-25 | Payer: 59 | Source: Home / Self Care | Admitting: Internal Medicine

## 2019-11-25 ENCOUNTER — Other Ambulatory Visit: Payer: Self-pay

## 2019-11-25 ENCOUNTER — Encounter (HOSPITAL_COMMUNITY): Admission: RE | Payer: Self-pay | Source: Home / Self Care

## 2019-11-25 DIAGNOSIS — U071 COVID-19: Secondary | ICD-10-CM

## 2019-11-25 SURGERY — COLONOSCOPY
Anesthesia: Moderate Sedation

## 2019-11-25 NOTE — Progress Notes (Signed)
Virtual Visit via Telephone Note  I connected with Patricia Wu on 11/25/19 at 12:22 PM by telephone and verified that I am speaking with the correct person using two identifiers.   I discussed the limitations, risks, security and privacy concerns of performing an evaluation and management service by telephone and the availability of in person appointments. I also discussed with the patient that there may be a patient responsible charge related to this service. The patient expressed understanding and agreed to proceed, consent obtained  Chief complaint: Covid 19  History of Present Illness: Patricia Wu is a 63 y.o. female  Had pre-procedure testing 2 days ago, plan for colonoscopy. positive for Covid 19.  No known sick contacts. Has been wearing mask.  Started yesterday with some cough, and headache. HA is better today.  No dyspnea.  Had loss of taste yesterday, but feels like better today.  Cough better today with cough syrup. Drinking fluids.  Staying in room by self.      Patient Active Problem List   Diagnosis Date Noted  . Essential hypertension 02/18/2018  . Elevated brain natriuretic peptide (BNP) level 02/18/2018  . CKD (chronic kidney disease) stage 3, GFR 30-59 ml/min 02/18/2018   Past Medical History:  Diagnosis Date  . Hypercholesteremia   . Hypertension   . Noncompliance with medication regimen    No past surgical history on file. Allergies  Allergen Reactions  . Ibuprofen Other (See Comments)    pcp said raises her BP   Prior to Admission medications   Medication Sig Start Date End Date Taking? Authorizing Provider  acetaminophen (TYLENOL) 325 MG tablet Take 650 mg by mouth daily as needed for mild pain.    Yes [provider]  amLODipine (NORVASC) 10 MG tablet Take 1 tablet (10 mg total) by mouth daily. 08/10/19  Yes Wendie Agreste, MD  lisinopril-hydrochlorothiazide (ZESTORETIC) 20-12.5 MG tablet Take 1 tablet by mouth daily. 08/10/19   Yes Wendie Agreste, MD  OVER THE COUNTER MEDICATION Take 1 tablet by mouth daily. [place]   Yes [provider]  simvastatin (ZOCOR) 20 MG tablet TAKE 1 TABLET BY MOUTH EVERY DAY Patient taking differently: Take 20 mg by mouth at bedtime.  08/10/19  Yes Wendie Agreste, MD   Social History   Socioeconomic History  . Marital status: Married    Spouse name: Not on file  . Number of children: 2  . Years of education: Not on file  . Highest education level: Not on file  Occupational History  . Not on file  Social Needs  . Financial resource strain: Not on file  . Food insecurity    Worry: Not on file    Inability: Not on file  . Transportation needs    Medical: Not on file    Non-medical: Not on file  Tobacco Use  . Smoking status: Never Smoker  . Smokeless tobacco: Never Used  Substance and Sexual Activity  . Alcohol use: No  . Drug use: No  . Sexual activity: Yes  Lifestyle  . Physical activity    Days per week: Not on file    Minutes per session: Not on file  . Stress: Not on file  Relationships  . Social Herbalist on phone: Not on file    Gets together: Not on file    Attends religious service: Not on file    Active member of club or organization: Not on file  Attends meetings of clubs or organizations: Not on file    Relationship status: Not on file  . Intimate partner violence    Fear of current or ex partner: Not on file    Emotionally abused: Not on file    Physically abused: Not on file    Forced sexual activity: Not on file  Other Topics Concern  . Not on file  Social History Narrative  . Not on file     Observations/Objective: Normal speech, no respiratory distress on phone, appropriate responses.  All questions answered with understanding of plan expressed.   There were no vitals filed for this visit.  Assessment and Plan: DTPNS-25 Initially asymptomatic when tested 2 days ago, started with symptoms yesterday.  Mild symptoms  and has started to improve today.  Maintaining hydration, denies dyspnea.  Masking, isolation discussed for 10 days from onset of symptoms, ER/Urgent care precautions given.   Follow Up Instructions: As needed and ER/urgent care precautions discussed.    I discussed the assessment and treatment plan with the patient. The patient was provided an opportunity to ask questions and all were answered. The patient agreed with the plan and demonstrated an understanding of the instructions.   The patient was advised to call back or seek an in-person evaluation if the symptoms worsen or if the condition fails to improve as anticipated.  I provided 9 minutes of non-face-to-face time during this encounter.  Signed,   Merri Ray, MD [facility].  11/25/19

## 2019-11-25 NOTE — Patient Instructions (Signed)
I am glad to hear your symptoms are improving.  You should quarantine/isolate for 10 days from onset of symptoms.  Wear a mask at all times if you do need to be around others.  See other information below.  For now okay to continue fluids, Tylenol if needed for body aches, but if any acute worsening symptoms such as shortness of breath, or confusion be seen right away.  Please let me know if I can help and take care.  This information is directly available on the CDC website: RunningShows.co.za.html    Source:CDC Reference to specific commercial products, manufacturers, companies, or trademarks does not constitute its endorsement or recommendation by the [place], [place], or Centers for [employer] and Prevention.

## 2019-11-25 NOTE — Progress Notes (Signed)
CC- tested positive for covid- tested positive on Monday 11/23/19. Only symptoms are Cough and headache since Mon. Patient was tested for covid due to she was scheduled to have an colonoscopy test today.

## 2020-02-10 ENCOUNTER — Other Ambulatory Visit: Payer: Self-pay

## 2020-02-10 ENCOUNTER — Encounter: Payer: Self-pay | Admitting: Family Medicine

## 2020-02-10 ENCOUNTER — Ambulatory Visit: Payer: 59 | Admitting: Family Medicine

## 2020-02-10 VITALS — BP 136/86 | HR 81 | Temp 98.1°F | Ht 62.0 in | Wt 232.0 lb

## 2020-02-10 DIAGNOSIS — Z6841 Body Mass Index (BMI) 40.0 and over, adult: Secondary | ICD-10-CM

## 2020-02-10 DIAGNOSIS — N1832 Chronic kidney disease, stage 3b: Secondary | ICD-10-CM

## 2020-02-10 DIAGNOSIS — I10 Essential (primary) hypertension: Secondary | ICD-10-CM | POA: Diagnosis not present

## 2020-02-10 DIAGNOSIS — E785 Hyperlipidemia, unspecified: Secondary | ICD-10-CM | POA: Diagnosis not present

## 2020-02-10 LAB — COMPREHENSIVE METABOLIC PANEL
ALT: 10 IU/L (ref 0–32)
AST: 12 IU/L (ref 0–40)
Albumin/Globulin Ratio: 1.3 (ref 1.2–2.2)
Albumin: 3.9 g/dL (ref 3.8–4.8)
Alkaline Phosphatase: 209 IU/L — ABNORMAL HIGH (ref 39–117)
BUN/Creatinine Ratio: 13 (ref 12–28)
BUN: 31 mg/dL — ABNORMAL HIGH (ref 8–27)
Bilirubin Total: 0.2 mg/dL (ref 0.0–1.2)
CO2: 18 mmol/L — ABNORMAL LOW (ref 20–29)
Calcium: 9.4 mg/dL (ref 8.7–10.3)
Chloride: 108 mmol/L — ABNORMAL HIGH (ref 96–106)
Creatinine, Ser: 2.38 mg/dL — ABNORMAL HIGH (ref 0.57–1.00)
GFR calc Af Amer: 24 mL/min/{1.73_m2} — ABNORMAL LOW (ref >59)
GFR calc non Af Amer: 21 mL/min/{1.73_m2} — ABNORMAL LOW (ref >59)
Globulin, Total: 3.1 g/dL (ref 1.5–4.5)
Glucose: 104 mg/dL — ABNORMAL HIGH (ref 65–99)
Potassium: 4.1 mmol/L (ref 3.5–5.2)
Sodium: 144 mmol/L (ref 134–144)
Total Protein: 7 g/dL (ref 6.0–8.5)

## 2020-02-10 LAB — LIPID PANEL
Chol/HDL Ratio: 4.2 ratio (ref 0.0–4.4)
Cholesterol, Total: 183 mg/dL (ref 100–199)
HDL: 44 mg/dL (ref >39)
LDL Chol Calc (NIH): 118 mg/dL — ABNORMAL HIGH (ref 0–99)
Triglycerides: 119 mg/dL (ref 0–149)
VLDL Cholesterol Cal: 21 mg/dL (ref 5–40)

## 2020-02-10 MED ORDER — LISINOPRIL-HYDROCHLOROTHIAZIDE 20-12.5 MG PO TABS: 1.0000 | ORAL_TABLET | Freq: Every day | ORAL | 2 refills | Status: DC

## 2020-02-10 MED ORDER — ATORVASTATIN CALCIUM 10 MG PO TABS: 10.0000 mg | ORAL_TABLET | Freq: Every day | ORAL | 1 refills | Status: DC

## 2020-02-10 MED ORDER — AMLODIPINE BESYLATE 10 MG PO TABS: 10.0000 mg | ORAL_TABLET | Freq: Every day | ORAL | 2 refills | Status: DC

## 2020-02-10 NOTE — Patient Instructions (Addendum)
  Try lipitor in place of simvastatin to see if that is tolerated and more effective.  No change in blood pressure meds for now. Try to minimize extra salt in the diet.  If readings at home are over 130/90 - we may need to increase meds - let me know.  Thanks for coming in today. Follow up in 6 months.     If you have lab work done today you will be contacted with your lab results within the next 2 weeks.  If you have not heard from [place] then please contact us. The fastest way to get your results is to register for My Chart.   IF you received an x-ray today, you will receive an invoice from [facility]. Please contact [facility] at [phone] with questions or concerns regarding your invoice.   IF you received labwork today, you will receive an invoice from [facility]. Please contact [facility] at [phone] with questions or concerns regarding your invoice.   Our billing staff will not be able to assist you with questions regarding bills from these companies.  You will be contacted with the lab results as soon as they are available. The fastest way to get your results is to activate your My Chart account. Instructions are located on the last page of this paperwork. If you have not heard from [place] regarding the results in 2 weeks, please contact this office.

## 2020-02-10 NOTE — Progress Notes (Signed)
Subjective:  Patient ID: Patricia Wu, female    DOB: Sep 18, 1956  Age: 64 y.o. MRN: [id number]  CC:  Chief Complaint  Patient presents with  . Follow-up    on hypertension and hyperlipidemia. no issues with her BP and no physical symptoms of her conditions. pt has  questions about her hyperlipidemia medication. pt states medications do seem to be working well.    HPI Patricia Wu presents for    Hypertension: Amlodipine 10 mg daily, lisinopril HCTZ 20/12.5 mg daily.  Associated chronic kidney disease, hypertensive renal disease.  Creatinine range 1.47-1.86 since 2016.  Most recent EGFR 39 in August.  Nephrologist Dr. Carolin Sicks. Unknown next appt.  No new side effects with meds.  Home readings: 125-130/70's.  Ham biscuit this morning.  Walking few days per week for exercise. BP Readings from Last 3 Encounters:  02/10/20 136/86  08/10/19 (!) 147/80  08/05/18 138/78   Lab Results  Component Value Date   CREATININE 1.62 (H) 08/10/2019   Hyperlipidemia: Simvastatin 20 mg daily.  Discussed 2 pills per day in August. Felt funny on 2 pills - stomach queasy.  Lab Results  Component Value Date   CHOL 217 (H) 08/10/2019   HDL 53 08/10/2019   LDLCALC 142 (H) 08/10/2019   TRIG 108 08/10/2019   CHOLHDL 4.1 08/10/2019   Lab Results  Component Value Date   ALT 12 08/10/2019   AST 11 08/10/2019   ALKPHOS 175 (H) 08/10/2019   BILITOT 0.4 08/10/2019   Obesity Body mass index is 42.43 kg/m. Wt Readings from Last 3 Encounters:  02/10/20 232 lb (105.2 kg)  08/10/19 231 lb 9.6 oz (105.1 kg)  08/05/18 214 lb 3.2 oz (97.2 kg)  As above walking 3 days/week for exercise.  Weight similar to August.     History Patient Active Problem List   Diagnosis Date Noted  . Essential hypertension 02/18/2018  . Elevated brain natriuretic peptide (BNP) level 02/18/2018  . CKD (chronic kidney disease) stage 3, GFR 30-59 ml/min 02/18/2018   Past Medical History:  Diagnosis Date  .  Hypercholesteremia   . Hypertension   . Noncompliance with medication regimen    History reviewed. No pertinent surgical history. Allergies  Allergen Reactions  . Ibuprofen Other (See Comments)    pcp said raises her BP   Prior to Admission medications   Medication Sig Start Date End Date Taking? Authorizing Provider  acetaminophen (TYLENOL) 325 MG tablet Take 650 mg by mouth daily as needed for mild pain.     [provider]  amLODipine (NORVASC) 10 MG tablet Take 1 tablet (10 mg total) by mouth daily. 08/10/19   Wendie Agreste, MD  lisinopril-hydrochlorothiazide (ZESTORETIC) 20-12.5 MG tablet Take 1 tablet by mouth daily. 08/10/19   Wendie Agreste, MD  OVER THE COUNTER MEDICATION Take 1 tablet by mouth daily. [place]    [provider]  simvastatin (ZOCOR) 20 MG tablet TAKE 1 TABLET BY MOUTH EVERY DAY Patient taking differently: Take 20 mg by mouth at bedtime.  08/10/19   Wendie Agreste, MD   Social History   Socioeconomic History  . Marital status: Married    Spouse name: Not on file  . Number of children: 2  . Years of education: Not on file  . Highest education level: Not on file  Occupational History  . Not on file  Tobacco Use  . Smoking status: Never Smoker  . Smokeless tobacco: Never Used  Substance and Sexual Activity  .  Alcohol use: No  . Drug use: No  . Sexual activity: Yes  Other Topics Concern  . Not on file  Social History Narrative  . Not on file   Social Determinants of Health   Financial Resource Strain:   . Difficulty of Paying Living Expenses: Not on file  Food Insecurity:   . Worried About Charity fundraiser in the Last Year: Not on file  . Ran Out of Food in the Last Year: Not on file  Transportation Needs:   . Lack of Transportation (Medical): Not on file  . Lack of Transportation (Non-Medical): Not on file  Physical Activity:   . Days of Exercise per Week: Not on file  . Minutes of Exercise per Session: Not on file    Stress:   . Feeling of Stress : Not on file  Social Connections:   . Frequency of Communication with Friends and Family: Not on file  . Frequency of Social Gatherings with Friends and Family: Not on file  . Attends Religious Services: Not on file  . Active Member of Clubs or Organizations: Not on file  . Attends Archivist Meetings: Not on file  . Marital Status: Not on file  Intimate Partner Violence:   . Fear of Current or Ex-Partner: Not on file  . Emotionally Abused: Not on file  . Physically Abused: Not on file  . Sexually Abused: Not on file    Review of Systems  Constitutional: Negative for fatigue and unexpected weight change.  Respiratory: Negative for chest tightness and shortness of breath.   Cardiovascular: Negative for chest pain, palpitations and leg swelling.  Gastrointestinal: Negative for abdominal pain and blood in stool.  Neurological: Negative for dizziness, syncope, light-headedness and headaches.     Objective:   Vitals:   02/10/20 0817 02/10/20 0821  BP: (!) 146/83 136/86  Pulse: 81   Temp: 98.1 F (36.7 C)   TempSrc: Temporal   SpO2: 98%   Weight: 232 lb (105.2 kg)   Height: 5' 2"  (1.575 m)      Physical Exam Vitals reviewed.  Constitutional:      Appearance: She is well-developed.  HENT:     Head: Normocephalic and atraumatic.  Eyes:     Conjunctiva/sclera: Conjunctivae normal.     Pupils: Pupils are equal, round, and reactive to light.  Neck:     Vascular: No carotid bruit.  Cardiovascular:     Rate and Rhythm: Normal rate and regular rhythm.     Heart sounds: Normal heart sounds.  Pulmonary:     Effort: Pulmonary effort is normal.     Breath sounds: Normal breath sounds.  Abdominal:     Palpations: Abdomen is soft. There is no pulsatile mass.     Tenderness: There is no abdominal tenderness.  Skin:    General: Skin is warm and dry.  Neurological:     Mental Status: She is alert and oriented to person, place, and  time.  Psychiatric:        Behavior: Behavior normal.       Assessment & Plan:  Patricia Wu is a 64 y.o. female . Hyperlipidemia, unspecified hyperlipidemia type - Plan: Lipid panel, atorvastatin (LIPITOR) 10 MG tablet  -Did not tolerate higher dose of simvastatin, will try different statin at Lipitor 10 mg daily, check baseline labs today, potential increased dose levels.-Side effects discussed, can return to simvastatin if does not tolerate Lipitor.  Essential hypertension - Plan: Comprehensive metabolic panel,  lisinopril-hydrochlorothiazide (ZESTORETIC) 20-12.5 MG tablet, amLODipine (NORVASC) 10 MG tablet  -Borderline.  Better home readings.  Continue same regimen, labs pending.  Salt avoidance.    Kidney disease, stage 3b  -To monitor blood pressure control, avoid, NSAIDs.  Continue hydration.  Follow-up with nephrology as planned.  Obesity Continue exercise, diet monitoring.  Recheck 6 months.  Meds ordered this encounter  Medications  . lisinopril-hydrochlorothiazide (ZESTORETIC) 20-12.5 MG tablet    Sig: Take 1 tablet by mouth daily.    Dispense:  90 tablet    Refill:  2  . amLODipine (NORVASC) 10 MG tablet    Sig: Take 1 tablet (10 mg total) by mouth daily.    Dispense:  90 tablet    Refill:  2  . atorvastatin (LIPITOR) 10 MG tablet    Sig: Take 1 tablet (10 mg total) by mouth daily.    Dispense:  90 tablet    Refill:  1   Patient Instructions    Try lipitor in place of simvastatin to see if that is tolerated and more effective.  No change in blood pressure meds for now. Try to minimize extra salt in the diet.  If readings at home are over 130/90 - we may need to increase meds - let me know.  Thanks for coming in today. Follow up in 6 months.     If you have lab work done today you will be contacted with your lab results within the next 2 weeks.  If you have not heard from [place] then please contact us. The fastest way to get your results is to register for My  Chart.   IF you received an x-ray today, you will receive an invoice from [facility]. Please contact [facility] at [phone] with questions or concerns regarding your invoice.   IF you received labwork today, you will receive an invoice from [facility]. Please contact [facility] at [phone] with questions or concerns regarding your invoice.   Our billing staff will not be able to assist you with questions regarding bills from these companies.  You will be contacted with the lab results as soon as they are available. The fastest way to get your results is to activate your My Chart account. Instructions are located on the last page of this paperwork. If you have not heard from [place] regarding the results in 2 weeks, please contact this office.         Signed, Merri Ray, MD [facility]

## 2020-02-11 ENCOUNTER — Other Ambulatory Visit: Payer: Self-pay | Admitting: Family Medicine

## 2020-02-11 DIAGNOSIS — R748 Abnormal levels of other serum enzymes: Secondary | ICD-10-CM

## 2020-02-11 DIAGNOSIS — R7989 Other specified abnormal findings of blood chemistry: Secondary | ICD-10-CM

## 2020-02-11 NOTE — Progress Notes (Unsigned)
See labs - plan for lab only visit 2/24.

## 2020-02-12 NOTE — Progress Notes (Signed)
Spoke with pt and scheduled nurse visit

## 2020-02-17 ENCOUNTER — Other Ambulatory Visit: Payer: Self-pay

## 2020-02-17 ENCOUNTER — Ambulatory Visit: Payer: 59

## 2020-02-17 DIAGNOSIS — R7989 Other specified abnormal findings of blood chemistry: Secondary | ICD-10-CM

## 2020-02-17 DIAGNOSIS — R748 Abnormal levels of other serum enzymes: Secondary | ICD-10-CM

## 2020-02-18 LAB — COMPREHENSIVE METABOLIC PANEL
ALT: 8 IU/L (ref 0–32)
AST: 12 IU/L (ref 0–40)
Albumin/Globulin Ratio: 1.3 (ref 1.2–2.2)
Albumin: 3.9 g/dL (ref 3.8–4.8)
Alkaline Phosphatase: 200 IU/L — ABNORMAL HIGH (ref 39–117)
BUN/Creatinine Ratio: 16 (ref 12–28)
BUN: 29 mg/dL — ABNORMAL HIGH (ref 8–27)
Bilirubin Total: <0.2 mg/dL (ref 0.0–1.2)
CO2: 19 mmol/L — ABNORMAL LOW (ref 20–29)
Calcium: 9.4 mg/dL (ref 8.7–10.3)
Chloride: 108 mmol/L — ABNORMAL HIGH (ref 96–106)
Creatinine, Ser: 1.77 mg/dL — ABNORMAL HIGH (ref 0.57–1.00)
GFR calc Af Amer: 35 mL/min/{1.73_m2} — ABNORMAL LOW (ref >59)
GFR calc non Af Amer: 30 mL/min/{1.73_m2} — ABNORMAL LOW (ref >59)
Globulin, Total: 3.1 g/dL (ref 1.5–4.5)
Glucose: 92 mg/dL (ref 65–99)
Potassium: 4.4 mmol/L (ref 3.5–5.2)
Sodium: 143 mmol/L (ref 134–144)
Total Protein: 7 g/dL (ref 6.0–8.5)

## 2020-03-07 ENCOUNTER — Other Ambulatory Visit: Payer: Self-pay | Admitting: Family Medicine

## 2020-03-07 DIAGNOSIS — R748 Abnormal levels of other serum enzymes: Secondary | ICD-10-CM

## 2020-05-30 ENCOUNTER — Other Ambulatory Visit: Payer: Self-pay | Admitting: Family Medicine

## 2020-05-30 ENCOUNTER — Telehealth: Payer: Self-pay | Admitting: Family Medicine

## 2020-05-30 DIAGNOSIS — E785 Hyperlipidemia, unspecified: Secondary | ICD-10-CM

## 2020-05-30 MED ORDER — ATORVASTATIN CALCIUM 10 MG PO TABS: 10.0000 mg | ORAL_TABLET | Freq: Every day | ORAL | 0 refills | Status: DC

## 2020-05-30 NOTE — Telephone Encounter (Signed)
Copied from [place] [id number]. Topic: General - Call Back - No Documentation >> May 30, 2020 11:04 AM Erick Blinks wrote: Reason for CRM: Pt wants a call back from PCP regarding an Elderberry vitamin, wants to know if this is safe for her take along with her medications. Please advise  Best contact: [phone]

## 2020-05-30 NOTE — Telephone Encounter (Signed)
Medication Refill - Medication: atorvastatin (LIPITOR) 10 MG tablet    Has the patient contacted their pharmacy? Yes.   (Agent: If no, request that the patient contact the pharmacy for the refill.) (Agent: If yes, when and what did the pharmacy advise?)  Preferred Pharmacy (with phone number or street name):  [facility] [place], [address]  [place] [address]  Phone: [phone] Fax: [phone]     Agent: Please be advised that RX refills may take up to 3 business days. We ask that you follow-up with your pharmacy.

## 2020-05-30 NOTE — Telephone Encounter (Signed)
Please advice if patient may take elderberry supplements along with her current medications.

## 2020-05-31 NOTE — Telephone Encounter (Signed)
Called spoke to pt she understands and is going to start this tomorrow

## 2020-05-31 NOTE — Telephone Encounter (Signed)
Commercial supplement should be ok.

## 2020-08-10 ENCOUNTER — Ambulatory Visit (INDEPENDENT_AMBULATORY_CARE_PROVIDER_SITE_OTHER): Payer: 59 | Admitting: Family Medicine

## 2020-08-10 ENCOUNTER — Other Ambulatory Visit: Payer: Self-pay

## 2020-08-10 ENCOUNTER — Encounter: Payer: Self-pay | Admitting: Family Medicine

## 2020-08-10 VITALS — BP 136/74 | HR 83 | Temp 97.9°F | Ht 62.0 in | Wt 228.0 lb

## 2020-08-10 DIAGNOSIS — R748 Abnormal levels of other serum enzymes: Secondary | ICD-10-CM

## 2020-08-10 DIAGNOSIS — N1832 Chronic kidney disease, stage 3b: Secondary | ICD-10-CM

## 2020-08-10 DIAGNOSIS — I10 Essential (primary) hypertension: Secondary | ICD-10-CM | POA: Diagnosis not present

## 2020-08-10 DIAGNOSIS — E785 Hyperlipidemia, unspecified: Secondary | ICD-10-CM

## 2020-08-10 MED ORDER — LISINOPRIL-HYDROCHLOROTHIAZIDE 20-12.5 MG PO TABS: 1.0000 | ORAL_TABLET | Freq: Every day | ORAL | 2 refills | Status: DC

## 2020-08-10 MED ORDER — AMLODIPINE BESYLATE 10 MG PO TABS: 10.0000 mg | ORAL_TABLET | Freq: Every day | ORAL | 2 refills | Status: DC

## 2020-08-10 MED ORDER — ATORVASTATIN CALCIUM 10 MG PO TABS: 10.0000 mg | ORAL_TABLET | Freq: Every day | ORAL | 2 refills | Status: DC

## 2020-08-10 NOTE — Progress Notes (Signed)
Subjective:  Patient ID: Patricia Wu, female    DOB: [birth year]  Age: 64 y.o. MRN: [id number]  CC:  Chief Complaint  Patient presents with  . Follow-up    on hypertension, hyperlipidemia, and kidney disease. Pt reports no issues with BP since last OV. pt checks her BP daily.  normaly running around 160/74 area.  pt reports no physical symptoms oh hypertension or hyperlipidemia. Pt reports no back pain or urinary symptoms.    HPI Patricia Wu presents for   Hypertension: With chronic kidney disease, stage IIIb Creatinine had temporarily increased to 2.38 on February 17, returned to baseline at 1.77 on February 24. Amlodipine 10 mg daily, lisinopril HCTZ 20/12.5 mg daily Home readings: 140-150/70-80.  No nsaids, drinking fluids, normal UOP.    BP Readings from Last 3 Encounters:  08/10/20 136/74  02/10/20 136/86  08/10/19 (!) 147/80   Lab Results  Component Value Date   CREATININE 1.77 (H) 02/17/2020   Elevated alkaline phosphatase 157 in July 2019, increased to 175 in August 2020, 209 in February, 200 repeat in February. Endocrinology evaluation recommended. Plan for evaluation by endocrinologist in [place]. Never got a call.   Hyperlipidemia: Lipitor 10 mg daily. No new myalgias. Fasting today.  Lab Results  Component Value Date   CHOL 183 02/10/2020   HDL 44 02/10/2020   LDLCALC 118 (H) 02/10/2020   TRIG 119 02/10/2020   CHOLHDL 4.2 02/10/2020   Lab Results  Component Value Date   ALT 8 02/17/2020   AST 12 02/17/2020   ALKPHOS 200 (H) 02/17/2020   BILITOT <0.2 02/17/2020   Has received covid vaccine.    History Patient Active Problem List   Diagnosis Date Noted  . Essential hypertension 02/18/2018  . Elevated brain natriuretic peptide (BNP) level 02/18/2018  . CKD (chronic kidney disease) stage 3, GFR 30-59 ml/min 02/18/2018   Past Medical History:  Diagnosis Date  . Hypercholesteremia   . Hypertension   . Noncompliance with medication  regimen    No past surgical history on file. Allergies  Allergen Reactions  . Ibuprofen Other (See Comments)    pcp said raises her BP   Prior to Admission medications   Medication Sig Start Date End Date Taking? Authorizing Provider  acetaminophen (TYLENOL) 325 MG tablet Take 650 mg by mouth daily as needed for mild pain.    Yes [provider]  amLODipine (NORVASC) 10 MG tablet Take 1 tablet (10 mg total) by mouth daily. 02/10/20  Yes Wendie Agreste, MD  atorvastatin (LIPITOR) 10 MG tablet Take 1 tablet (10 mg total) by mouth daily. 05/30/20  Yes Wendie Agreste, MD  lisinopril-hydrochlorothiazide (ZESTORETIC) 20-12.5 MG tablet Take 1 tablet by mouth daily. 02/10/20  Yes Wendie Agreste, MD  OVER THE COUNTER MEDICATION Take 1 tablet by mouth daily. Goli   Yes [provider]   Social History   Socioeconomic History  . Marital status: Married    Spouse name: Not on file  . Number of children: 2  . Years of education: Not on file  . Highest education level: Not on file  Occupational History  . Not on file  Tobacco Use  . Smoking status: Never Smoker  . Smokeless tobacco: Never Used  Substance and Sexual Activity  . Alcohol use: No  . Drug use: No  . Sexual activity: Yes  Other Topics Concern  . Not on file  Social History Narrative  . Not on file   Social  Determinants of Health   Financial Resource Strain:   . Difficulty of Paying Living Expenses:   Food Insecurity:   . Worried About Charity fundraiser in the Last Year:   . Arboriculturist in the Last Year:   Transportation Needs:   . Film/video editor (Medical):   Marland Kitchen Lack of Transportation (Non-Medical):   Physical Activity:   . Days of Exercise per Week:   . Minutes of Exercise per Session:   Stress:   . Feeling of Stress :   Social Connections:   . Frequency of Communication with Friends and Family:   . Frequency of Social Gatherings with Friends and Family:   . Attends Religious  Services:   . Active Member of Clubs or Organizations:   . Attends Archivist Meetings:   Marland Kitchen Marital Status:   Intimate Partner Violence:   . Fear of Current or Ex-Partner:   . Emotionally Abused:   Marland Kitchen Physically Abused:   . Sexually Abused:     Review of Systems  Constitutional: Negative for fatigue and unexpected weight change.  Respiratory: Negative for chest tightness and shortness of breath.   Cardiovascular: Negative for chest pain, palpitations and leg swelling.  Gastrointestinal: Negative for abdominal pain and blood in stool.  Musculoskeletal: Negative for arthralgias and myalgias.  Neurological: Negative for dizziness, syncope, light-headedness and headaches.    Objective:   Vitals:   08/10/20 0834 08/10/20 0837  BP: (!) 148/84 136/74  Pulse: 83   Temp: 97.9 F (36.6 C)   TempSrc: Temporal   SpO2: 99%   Weight: 228 lb (103.4 kg)   Height: 5\' 2"  (1.575 m)      Physical Exam Vitals reviewed.  Constitutional:      Appearance: She is well-developed.  HENT:     Head: Normocephalic and atraumatic.  Eyes:     Conjunctiva/sclera: Conjunctivae normal.     Pupils: Pupils are equal, round, and reactive to light.  Neck:     Vascular: No carotid bruit.  Cardiovascular:     Rate and Rhythm: Normal rate and regular rhythm.     Heart sounds: Normal heart sounds.  Pulmonary:     Effort: Pulmonary effort is normal.     Breath sounds: Normal breath sounds.  Abdominal:     Palpations: Abdomen is soft. There is no pulsatile mass.     Tenderness: There is no abdominal tenderness.  Skin:    General: Skin is warm and dry.  Neurological:     Mental Status: She is alert and oriented to person, place, and time.  Psychiatric:        Behavior: Behavior normal.        Assessment & Plan:  Patricia Wu is a 64 y.o. female . Essential hypertension - Plan: Comprehensive metabolic panel, lisinopril-hydrochlorothiazide (ZESTORETIC) 20-12.5 MG tablet, amLODipine  (NORVASC) 10 MG tablet Stage 3b chronic kidney disease  - Stable, tolerating current regimen. Medications refilled. Labs pending as above. Handout on home reading.   Elevated alkaline phosphatase level - Plan: Gamma GT, Comprehensive metabolic panel  - check GGT, then repeat referral to endocrine if still elevated.   Hyperlipidemia, unspecified hyperlipidemia type - Plan: Lipid panel, atorvastatin (LIPITOR) 10 MG tablet  -  Stable, tolerating current regimen. Medications refilled. Labs pending as above.    Meds ordered this encounter  Medications  . lisinopril-hydrochlorothiazide (ZESTORETIC) 20-12.5 MG tablet    Sig: Take 1 tablet by mouth daily.    Dispense:  90 tablet    Refill:  2  . amLODipine (NORVASC) 10 MG tablet    Sig: Take 1 tablet (10 mg total) by mouth daily.    Dispense:  90 tablet    Refill:  2  . atorvastatin (LIPITOR) 10 MG tablet    Sig: Take 1 tablet (10 mg total) by mouth daily.    Dispense:  90 tablet    Refill:  2   Patient Instructions    Blood pressure looks ok. See info on checking at home.  No medication changes for now.  If alkaline phosphatase is still elevated I can refer you to endocrinology in [place].  Let me know if there are questions  How to Take Your Blood Pressure You can take your blood pressure at home with a machine. You may need to check your blood pressure at home:  To check if you have high blood pressure (hypertension).  To check your blood pressure over time.  To make sure your blood pressure medicine is working. Supplies needed: You will need a blood pressure machine, or monitor. You can buy one at a drugstore or online. When choosing one:  Choose one with an arm cuff.  Choose one that wraps around your upper arm. Only one finger should fit between your arm and the cuff.  Do not choose one that measures your blood pressure from your wrist or finger. Your doctor can suggest a monitor. How to prepare Avoid these things  for 30 minutes before checking your blood pressure:  Drinking caffeine.  Drinking alcohol.  Eating.  Smoking.  Exercising. Five minutes before checking your blood pressure:  Pee.  Sit in a dining chair. Avoid sitting in a soft couch or armchair.  Be quiet. Do not talk. How to take your blood pressure Follow the instructions that came with your machine. If you have a digital blood pressure monitor, these may be the instructions: 1. Sit up straight. 2. Place your feet on the floor. Do not cross your ankles or legs. 3. Rest your left arm at the level of your heart. You may rest it on a table, desk, or chair. 4. Pull up your shirt sleeve. 5. Wrap the blood pressure cuff around the upper part of your left arm. The cuff should be 1 inch (2.5 cm) above your elbow. It is best to wrap the cuff around bare skin. 6. Fit the cuff snugly around your arm. You should be able to place only one finger between the cuff and your arm. 7. Put the cord inside the groove of your elbow. 8. Press the power button. 9. Sit quietly while the cuff fills with air and loses air. 10. Write down the numbers on the screen. 11. Wait 2-3 minutes and then repeat steps 1-10. What do the numbers mean? Two numbers make up your blood pressure. The first number is called systolic pressure. The second is called diastolic pressure. An example of a blood pressure reading is "120 over 80" (or 120/80). If you are an adult and do not have a medical condition, use this guide to find out if your blood pressure is normal: Normal  First number: below 120.  Second number: below 80. Elevated  First number: 120-129.  Second number: below 80. Hypertension stage 1  First number: 130-139.  Second number: 80-89. Hypertension stage 2  First number: 140 or above.  Second number: 32 or above. Your blood pressure is above normal even if only the top or bottom number  is above normal. Follow these instructions at  home:  Check your blood pressure as often as your doctor tells you to.  Take your monitor to your next doctor's appointment. Your doctor will: ? Make sure you are using it correctly. ? Make sure it is working right.  Make sure you understand what your blood pressure numbers should be.  Tell your doctor if your medicines are causing side effects. Contact a doctor if:  Your blood pressure keeps being high. Get help right away if:  Your first blood pressure number is higher than 180.  Your second blood pressure number is higher than 120. This information is not intended to replace advice given to you by your health care provider. Make sure you discuss any questions you have with your health care provider. Document Revised: 11/22/2017 Document Reviewed: 05/18/2016 Elsevier Patient Education  [employer].   If you have lab work done today you will be contacted with your lab results within the next 2 weeks.  If you have not heard from [place] then please contact us. The fastest way to get your results is to register for My Chart.   IF you received an x-ray today, you will receive an invoice from [facility]. Please contact [facility] at [phone] with questions or concerns regarding your invoice.   IF you received labwork today, you will receive an invoice from [place]. Please contact [facility] at [phone] with questions or concerns regarding your invoice.   Our billing staff will not be able to assist you with questions regarding bills from these companies.  You will be contacted with the lab results as soon as they are available. The fastest way to get your results is to activate your My Chart account. Instructions are located on the last page of this paperwork. If you have not heard from [place] regarding the results in 2 weeks, please contact this office.         Signed, Merri Ray, MD [facility]

## 2020-08-10 NOTE — Patient Instructions (Addendum)
Blood pressure looks ok. See info on checking at home.  No medication changes for now.  If alkaline phosphatase is still elevated I can refer you to endocrinology in [place].  Let me know if there are questions  How to Take Your Blood Pressure You can take your blood pressure at home with a machine. You may need to check your blood pressure at home:  To check if you have high blood pressure (hypertension).  To check your blood pressure over time.  To make sure your blood pressure medicine is working. Supplies needed: You will need a blood pressure machine, or monitor. You can buy one at a drugstore or online. When choosing one:  Choose one with an arm cuff.  Choose one that wraps around your upper arm. Only one finger should fit between your arm and the cuff.  Do not choose one that measures your blood pressure from your wrist or finger. Your doctor can suggest a monitor. How to prepare Avoid these things for 30 minutes before checking your blood pressure:  Drinking caffeine.  Drinking alcohol.  Eating.  Smoking.  Exercising. Five minutes before checking your blood pressure:  Pee.  Sit in a dining chair. Avoid sitting in a soft couch or armchair.  Be quiet. Do not talk. How to take your blood pressure Follow the instructions that came with your machine. If you have a digital blood pressure monitor, these may be the instructions: 1. Sit up straight. 2. Place your feet on the floor. Do not cross your ankles or legs. 3. Rest your left arm at the level of your heart. You may rest it on a table, desk, or chair. 4. Pull up your shirt sleeve. 5. Wrap the blood pressure cuff around the upper part of your left arm. The cuff should be 1 inch (2.5 cm) above your elbow. It is best to wrap the cuff around bare skin. 6. Fit the cuff snugly around your arm. You should be able to place only one finger between the cuff and your arm. 7. Put the cord inside the groove of your  elbow. 8. Press the power button. 9. Sit quietly while the cuff fills with air and loses air. 10. Write down the numbers on the screen. 11. Wait 2-3 minutes and then repeat steps 1-10. What do the numbers mean? Two numbers make up your blood pressure. The first number is called systolic pressure. The second is called diastolic pressure. An example of a blood pressure reading is "120 over 80" (or 120/80). If you are an adult and do not have a medical condition, use this guide to find out if your blood pressure is normal: Normal  First number: below 120.  Second number: below 80. Elevated  First number: 120-129.  Second number: below 80. Hypertension stage 1  First number: 130-139.  Second number: 80-89. Hypertension stage 2  First number: 140 or above.  Second number: 61 or above. Your blood pressure is above normal even if only the top or bottom number is above normal. Follow these instructions at home:  Check your blood pressure as often as your doctor tells you to.  Take your monitor to your next doctor's appointment. Your doctor will: ? Make sure you are using it correctly. ? Make sure it is working right.  Make sure you understand what your blood pressure numbers should be.  Tell your doctor if your medicines are causing side effects. Contact a doctor if:  Your blood pressure keeps being  high. Get help right away if:  Your first blood pressure number is higher than 180.  Your second blood pressure number is higher than 120. This information is not intended to replace advice given to you by your health care provider. Make sure you discuss any questions you have with your health care provider. Document Revised: 11/22/2017 Document Reviewed: 05/18/2016 Elsevier Patient Education  [employer].   If you have lab work done today you will be contacted with your lab results within the next 2 weeks.  If you have not heard from [place] then please contact us. The  fastest way to get your results is to register for My Chart.   IF you received an x-ray today, you will receive an invoice from [facility]. Please contact [facility] at [phone] with questions or concerns regarding your invoice.   IF you received labwork today, you will receive an invoice from [place]. Please contact [facility] at [phone] with questions or concerns regarding your invoice.   Our billing staff will not be able to assist you with questions regarding bills from these companies.  You will be contacted with the lab results as soon as they are available. The fastest way to get your results is to activate your My Chart account. Instructions are located on the last page of this paperwork. If you have not heard from [place] regarding the results in 2 weeks, please contact this office.

## 2020-08-11 LAB — COMPREHENSIVE METABOLIC PANEL
ALT: 13 IU/L (ref 0–32)
AST: 13 IU/L (ref 0–40)
Albumin/Globulin Ratio: 1.3 (ref 1.2–2.2)
Albumin: 4 g/dL (ref 3.8–4.8)
Alkaline Phosphatase: 249 IU/L — ABNORMAL HIGH (ref 48–121)
BUN/Creatinine Ratio: 15 (ref 12–28)
BUN: 32 mg/dL — ABNORMAL HIGH (ref 8–27)
Bilirubin Total: 0.2 mg/dL (ref 0.0–1.2)
CO2: 19 mmol/L — ABNORMAL LOW (ref 20–29)
Calcium: 9.5 mg/dL (ref 8.7–10.3)
Chloride: 109 mmol/L — ABNORMAL HIGH (ref 96–106)
Creatinine, Ser: 2.11 mg/dL — ABNORMAL HIGH (ref 0.57–1.00)
GFR calc Af Amer: 28 mL/min/{1.73_m2} — ABNORMAL LOW (ref >59)
GFR calc non Af Amer: 24 mL/min/{1.73_m2} — ABNORMAL LOW (ref >59)
Globulin, Total: 3.1 g/dL (ref 1.5–4.5)
Glucose: 96 mg/dL (ref 65–99)
Potassium: 4.2 mmol/L (ref 3.5–5.2)
Sodium: 143 mmol/L (ref 134–144)
Total Protein: 7.1 g/dL (ref 6.0–8.5)

## 2020-08-11 LAB — LIPID PANEL
Chol/HDL Ratio: 4.5 ratio — ABNORMAL HIGH (ref 0.0–4.4)
Cholesterol, Total: 192 mg/dL (ref 100–199)
HDL: 43 mg/dL (ref >39)
LDL Chol Calc (NIH): 129 mg/dL — ABNORMAL HIGH (ref 0–99)
Triglycerides: 109 mg/dL (ref 0–149)
VLDL Cholesterol Cal: 20 mg/dL (ref 5–40)

## 2020-08-11 LAB — GAMMA GT: GGT: 252 IU/L — ABNORMAL HIGH (ref 0–60)

## 2020-08-16 ENCOUNTER — Other Ambulatory Visit: Payer: Self-pay | Admitting: Family Medicine

## 2020-08-16 ENCOUNTER — Telehealth: Payer: Self-pay

## 2020-08-16 DIAGNOSIS — R748 Abnormal levels of other serum enzymes: Secondary | ICD-10-CM

## 2020-08-16 DIAGNOSIS — N183 Chronic kidney disease, stage 3 unspecified: Secondary | ICD-10-CM

## 2020-08-16 NOTE — Telephone Encounter (Signed)
I have spoken to the pt to give her lab results and she would like to go to a GASTROENTEROLOGY in [place] if that is possible.  Thanks.

## 2020-08-17 ENCOUNTER — Telehealth: Payer: Self-pay | Admitting: Family Medicine

## 2020-08-17 NOTE — Telephone Encounter (Signed)
Pt called again regarding her referral for Gastroenterology be in [place] instead of [place]. Pt was called by [facility] and just wants to make sure it is being switched. Please advise.

## 2020-08-19 ENCOUNTER — Encounter: Payer: Self-pay | Admitting: Internal Medicine

## 2020-09-14 ENCOUNTER — Ambulatory Visit: Payer: Self-pay | Admitting: *Deleted

## 2020-09-14 NOTE — Telephone Encounter (Signed)
Dr. Carlota Raspberry,  Pt called and wanted to know of other vitamins that she can take bc she lacks energy. She is currently taken Alive vitamins but would like to try something else. I spoke to her and told her that she could contact her pharmacy to see if they could also advise her on an OTC medication to  help her regain some energy.   I am sending to get feedback from you to assist her with her concerns.  Thank you

## 2020-09-14 NOTE — Telephone Encounter (Signed)
Per initial encounter, 'Pt called to ask a question about vitamins she can take/ Pt states she lacks energy / please advise'; contacted the patient.she takes "Alive" vitamins and would like to change to something else; the patient says she is "ok"; the patient can be contacted at [phone]; she sees Dr Carlota Raspberry, [facility]; will route to office for final disposition.   Reason for Disposition  [1] Caller requesting NON-URGENT health information AND [2] PCP's office is the best resource  Answer Assessment - Initial Assessment Questions 1. REASON FOR CALL or QUESTION: "What is your reason for calling today?" or "How can I best help you?" or "What question do you have that I can help answer?"     "What vitamin can I take"  Protocols used: INFORMATION ONLY CALL - NO TRIAGE-A-AH

## 2020-09-15 NOTE — Telephone Encounter (Signed)
I have spoken with the pt and sent message to scheduling poole to contact pt to schedule a nurse visit to have labs drawn

## 2020-09-15 NOTE — Telephone Encounter (Signed)
Please contact pt to schedule a nurse visit to have labs drawn.

## 2020-09-15 NOTE — Telephone Encounter (Signed)
Lab visit has been sch for 9/30

## 2020-09-15 NOTE — Telephone Encounter (Signed)
Please see last lab notes.  Her kidney function test had increased.  I did ask that she have that repeated within a few weeks..  Please have her come in in the next 2 days to have those electrolytes rechecked to make sure her worsening kidney function is not contributing to her fatigue.  If that is stable or improved, can follow-up to discuss other causes of fatigue.  I do not recommend any specific supplements at this time until we know more information.

## 2020-09-20 ENCOUNTER — Encounter: Payer: Self-pay | Admitting: Gastroenterology

## 2020-09-20 NOTE — Progress Notes (Signed)
Referring Provider: Wendie Agreste, MD Primary Care Physician:  Wendie Agreste, MD Primary Gastroenterologist:  Dr. Abbey Chatters  Chief Complaint  Patient presents with  . Abnormal Lab    HPI:   Patricia Wu is a 64 y.o. female presenting today at the request of Wendie Agreste, MD for elevated alk phos.  History significant for HTN, HLD, and CKD.  Isolated elevation of alk phos has been present since February 2019.  Appears this has been persistently elevating with most recent alk phos 249 on 08/10/2020.  GGT at that time elevated at 252.  PCP has ordered an ultrasound but does not appear this has been scheduled yet.  Today:  No swelling in abdomen in LE. No confusion. No yellowing of eyes. No bruising or bleeding. No abdominal pain, nausea, or vomiting. BMs every other day. Stools are usually soft and formed. No blood in the stool or black stool. No GERD symptoms or dysphagia.   No alcohol. Tylenol as needed-maybe once a month. No drug use. No known exposure to hepatitis.   No know family history of liver disease. No known family history of autoimmune conditions. No family history of colon cancer.   Taking Goli vitamin. Started these 1 month ago.  Has been on Lipitor for years.   No prior colonoscopy.   Past Medical History:  Diagnosis Date  . CKD (chronic kidney disease)   . Hypercholesteremia   . Hypertension   . Noncompliance with medication regimen     History reviewed. No pertinent surgical history.  Current Outpatient Medications  Medication Sig Dispense Refill  . acetaminophen (TYLENOL) 325 MG tablet Take 650 mg by mouth daily as needed for mild pain.     Marland Kitchen amLODipine (NORVASC) 10 MG tablet Take 1 tablet (10 mg total) by mouth daily. 90 tablet 2  . atorvastatin (LIPITOR) 10 MG tablet Take 1 tablet (10 mg total) by mouth daily. 90 tablet 2  . lisinopril-hydrochlorothiazide (ZESTORETIC) 20-12.5 MG tablet Take 1 tablet by mouth daily. 90 tablet 2  . OVER THE  COUNTER MEDICATION Take 1 tablet by mouth as needed. Goli     No current facility-administered medications for this visit.    Allergies as of 09/21/2020 - Review Complete 09/21/2020  Allergen Reaction Noted  . Ibuprofen Other (See Comments) 04/29/2018    Family History  Problem Relation Age of Onset  . Liver cancer Neg Hx   . Colon cancer Neg Hx     Social History   Socioeconomic History  . Marital status: Married    Spouse name: Not on file  . Number of children: 2  . Years of education: Not on file  . Highest education level: Not on file  Occupational History  . Not on file  Tobacco Use  . Smoking status: Never Smoker  . Smokeless tobacco: Never Used  Substance and Sexual Activity  . Alcohol use: No  . Drug use: No  . Sexual activity: Yes  Other Topics Concern  . Not on file  Social History Narrative  . Not on file   Social Determinants of Health   Financial Resource Strain:   . Difficulty of Paying Living Expenses: Not on file  Food Insecurity:   . Worried About Charity fundraiser in the Last Year: Not on file  . Ran Out of Food in the Last Year: Not on file  Transportation Needs:   . Lack of Transportation (Medical): Not on file  . Lack of Transportation (  Non-Medical): Not on file  Physical Activity:   . Days of Exercise per Week: Not on file  . Minutes of Exercise per Session: Not on file  Stress:   . Feeling of Stress : Not on file  Social Connections:   . Frequency of Communication with Friends and Family: Not on file  . Frequency of Social Gatherings with Friends and Family: Not on file  . Attends Religious Services: Not on file  . Active Member of Clubs or Organizations: Not on file  . Attends Archivist Meetings: Not on file  . Marital Status: Not on file  Intimate Partner Violence:   . Fear of Current or Ex-Partner: Not on file  . Emotionally Abused: Not on file  . Physically Abused: Not on file  . Sexually Abused: Not on file     Review of Systems: Gen: Denies any fever, chills, cold or flu like symptoms, pre-syncope or syncope.  CV: Denies chest pain or heart palpitations Resp: Denies shortness of breath or cough.  GI: See HPI GU : Denies urinary burning, urinary frequency, urinary hesitancy Derm: Denies rash MS: Denies joint pain Heme: See HPI  Physical Exam: BP 136/80   Pulse 72   Temp (!) 97 F (36.1 C) (Oral)   Ht 5' 4"  (1.626 m)   Wt 228 lb 6.4 oz (103.6 kg)   BMI 39.20 kg/m  General:   Alert and oriented. Pleasant and cooperative. Well-nourished and well-developed.  Head:  Normocephalic and atraumatic. Eyes:  Without icterus, sclera clear and conjunctiva pink.  Ears:  Normal auditory acuity. Lungs:  Clear to auscultation bilaterally. No wheezes, rales, or rhonchi. No distress.  Heart:  S1, S2 present without murmurs appreciated.  Abdomen:  +BS, soft, non-tender and non-distended. No HSM noted. No guarding or rebound. No masses appreciated.  Rectal:  Deferred  Msk:  Symmetrical without gross deformities. Normal posture. Extremities:  Without edema. Neurologic:  Alert and  oriented x4;  grossly normal neurologically. Skin:  Intact without significant lesions or rashes. Psych:  Normal mood and affect.

## 2020-09-21 ENCOUNTER — Ambulatory Visit (INDEPENDENT_AMBULATORY_CARE_PROVIDER_SITE_OTHER): Payer: 59 | Admitting: Gastroenterology

## 2020-09-21 ENCOUNTER — Encounter: Payer: Self-pay | Admitting: Gastroenterology

## 2020-09-21 ENCOUNTER — Other Ambulatory Visit: Payer: Self-pay

## 2020-09-21 ENCOUNTER — Encounter: Payer: Self-pay | Admitting: *Deleted

## 2020-09-21 VITALS — BP 136/80 | HR 72 | Temp 97.0°F | Ht 64.0 in | Wt 228.4 lb

## 2020-09-21 DIAGNOSIS — R748 Abnormal levels of other serum enzymes: Secondary | ICD-10-CM

## 2020-09-21 DIAGNOSIS — Z1211 Encounter for screening for malignant neoplasm of colon: Secondary | ICD-10-CM

## 2020-09-21 NOTE — Patient Instructions (Addendum)
Please lave labs and ultrasound completed at [facility].   Please stop the Goli vitamins for now.   We will arrange for you to have a colonoscopy in the near future with Dr. Abbey Chatters.   We will call you with lab results and further recommendations.   We will follow-up in the office after your colonoscopy.   Aliene Altes, PA-C [facility]

## 2020-09-22 ENCOUNTER — Ambulatory Visit (INDEPENDENT_AMBULATORY_CARE_PROVIDER_SITE_OTHER): Payer: 59 | Admitting: Family Medicine

## 2020-09-22 ENCOUNTER — Other Ambulatory Visit: Payer: Self-pay

## 2020-09-22 DIAGNOSIS — N183 Chronic kidney disease, stage 3 unspecified: Secondary | ICD-10-CM

## 2020-09-22 LAB — BASIC METABOLIC PANEL
BUN/Creatinine Ratio: 15 (ref 12–28)
BUN: 31 mg/dL — ABNORMAL HIGH (ref 8–27)
CO2: 20 mmol/L (ref 20–29)
Calcium: 9.7 mg/dL (ref 8.7–10.3)
Chloride: 112 mmol/L — ABNORMAL HIGH (ref 96–106)
Creatinine, Ser: 2.13 mg/dL — ABNORMAL HIGH (ref 0.57–1.00)
GFR calc Af Amer: 28 mL/min/{1.73_m2} — ABNORMAL LOW (ref >59)
GFR calc non Af Amer: 24 mL/min/{1.73_m2} — ABNORMAL LOW (ref >59)
Glucose: 95 mg/dL (ref 65–99)
Potassium: 4.6 mmol/L (ref 3.5–5.2)
Sodium: 146 mmol/L — ABNORMAL HIGH (ref 134–144)

## 2020-09-23 NOTE — Assessment & Plan Note (Addendum)
64 year old female with history of HTN, HLD, CKD and elevated alk phos with no prior colonoscopy.  No significant upper or lower GI symptoms.  No alarm symptoms.  No family history of colon cancer.  Plan: Proceed with colonoscopy with propofol with Dr. Abbey Chatters in the near future. The risks, benefits, and alternatives have been discussed with the patient in detail. The patient states understanding and desires to proceed.  ASA II Follow-up after colonoscopy.

## 2020-09-23 NOTE — Assessment & Plan Note (Addendum)
64 year old female with history of CKD, HTN, HLD presenting today for further evaluation of elevated alk phos.  She has had isolated elevated alkaline phosphatase since February 2019.  Most recent alk phos 249 on 08/10/2020.  GGT at that time was also elevated at 252.  AST, ALT, and total bilirubin have remained within normal limits.  She has no signs or symptoms of advanced liver disease.  Denies abdominal pain, nausea, vomiting.  No known family history of liver disease or other autoimmune conditions.  No regular alcohol or Tylenol use.  No history of drug use.  She did recently start Goli vitamins.  No other OTC supplements. Has been on Lipitor for years.   Alk phos can be elevated in the setting of CKD. However, with GGT elevated, we will need to evaluate for additional causes including bile duct abnormalities, PBC, PSC. Doubt Goli vitamins are contributing but will have her stop these for now.   Plan: Ultrasound abdomen completed Check ANA, AMA, ASMA.  This is in accordance with ACG guidelines. Advise she stop Goli vitamins for now and avoid all other OTC supplements. Further recommendations to follow.

## 2020-09-27 ENCOUNTER — Telehealth: Payer: Self-pay | Admitting: Internal Medicine

## 2020-09-27 NOTE — Telephone Encounter (Signed)
Pt has questions about her labs. [phone]

## 2020-09-27 NOTE — Telephone Encounter (Signed)
Called pt. TCS with Dr. Abbey Chatters, asa 2 scheduled for 10/25 at 1:00pm. Aware will mail prep instructions w/ covid test appt

## 2020-09-27 NOTE — Telephone Encounter (Signed)
PA for TCS approved via [employer]. Auth# [id number] DOS 10/17/20-01/15/2021

## 2020-09-27 NOTE — Telephone Encounter (Signed)
Lmom, waiting on a return call.  

## 2020-09-27 NOTE — Telephone Encounter (Signed)
Pt returned call. Pt asked if she could have her blood work completed on the same days as her u/s next week. Pt is aware that it's ok to have labs and u/s done on the same day.   Pt think someone called from our office to schedule her TCS. Mindy did you call pt?

## 2020-09-29 ENCOUNTER — Ambulatory Visit (HOSPITAL_COMMUNITY): Payer: 59

## 2020-10-04 ENCOUNTER — Telehealth: Payer: Self-pay | Admitting: Internal Medicine

## 2020-10-04 NOTE — Telephone Encounter (Signed)
Pt is scheduled U/S tomorrow and was asking when she needed to do her labs. Please advise and call her at [phone]

## 2020-10-04 NOTE — Telephone Encounter (Signed)
Spoke with pt. Pt wants to have labs done on the same day as CT. Pt will have labs completed at [facility] prior to CT.

## 2020-10-05 ENCOUNTER — Other Ambulatory Visit (HOSPITAL_COMMUNITY)
Admission: RE | Admit: 2020-10-05 | Discharge: 2020-10-05 | Disposition: A | Discharge status: Home / Self Care | Payer: 59 | Source: Ambulatory Visit | Attending: Gastroenterology | Admitting: Gastroenterology

## 2020-10-05 ENCOUNTER — Other Ambulatory Visit: Payer: Self-pay

## 2020-10-05 ENCOUNTER — Ambulatory Visit (HOSPITAL_COMMUNITY)
Admission: RE | Admit: 2020-10-05 | Discharge: 2020-10-05 | Disposition: A | Discharge status: Home / Self Care | Payer: 59 | Source: Ambulatory Visit | Attending: Gastroenterology | Admitting: Gastroenterology

## 2020-10-05 DIAGNOSIS — R748 Abnormal levels of other serum enzymes: Secondary | ICD-10-CM | POA: Insufficient documentation

## 2020-10-06 LAB — ANA: Anti Nuclear Antibody (ANA): NEGATIVE

## 2020-10-07 ENCOUNTER — Telehealth: Payer: Self-pay | Admitting: Internal Medicine

## 2020-10-07 LAB — MITOCHONDRIAL ANTIBODIES: Mitochondrial M2 Ab, IgG: <20 Units (ref 0.0–20.0)

## 2020-10-07 LAB — ANTI-SMOOTH MUSCLE ANTIBODY, IGG: F-Actin IgG: 10 Units (ref 0–19)

## 2020-10-07 NOTE — Telephone Encounter (Signed)
PATIENT CALLED ASKING IF SHE CAN USE THE OLD PREP SHE ALREADY HAD FOR HER TCS

## 2020-10-07 NOTE — Progress Notes (Signed)
[place] with gallstones but no inflammation of her gallbladder. Nothing needs to be done about the gallstones unless she starts having RUQ abdominal pain.   Evidence of fatty liver.   We are waiting on labs to result. Will have additional recommendations at that time.

## 2020-10-07 NOTE — Telephone Encounter (Signed)
Pt states she has suprep at home and does not expire until 2022. Advised she can use this prep and she will come by and pick up new instructions

## 2020-10-10 ENCOUNTER — Other Ambulatory Visit: Payer: Self-pay | Admitting: *Deleted

## 2020-10-10 DIAGNOSIS — R748 Abnormal levels of other serum enzymes: Secondary | ICD-10-CM

## 2020-10-10 DIAGNOSIS — Z1211 Encounter for screening for malignant neoplasm of colon: Secondary | ICD-10-CM

## 2020-10-11 ENCOUNTER — Ambulatory Visit: Payer: 59 | Admitting: Gastroenterology

## 2020-10-11 ENCOUNTER — Other Ambulatory Visit (HOSPITAL_COMMUNITY)
Admission: RE | Admit: 2020-10-11 | Discharge: 2020-10-11 | Disposition: A | Discharge status: Home / Self Care | Payer: 59 | Source: Ambulatory Visit | Attending: Gastroenterology | Admitting: Gastroenterology

## 2020-10-11 DIAGNOSIS — R748 Abnormal levels of other serum enzymes: Secondary | ICD-10-CM | POA: Diagnosis present

## 2020-10-11 LAB — HEPATIC FUNCTION PANEL
ALT: 21 U/L (ref 0–44)
AST: 16 U/L (ref 15–41)
Albumin: 4 g/dL (ref 3.5–5.0)
Alkaline Phosphatase: 238 U/L — ABNORMAL HIGH (ref 38–126)
Bilirubin, Direct: <0.1 mg/dL (ref 0.0–0.2)
Total Bilirubin: 0.8 mg/dL (ref 0.3–1.2)
Total Protein: 8.1 g/dL (ref 6.5–8.1)

## 2020-10-11 LAB — HEPATITIS B SURFACE ANTIGEN: Hepatitis B Surface Ag: NONREACTIVE

## 2020-10-11 LAB — HEPATITIS B SURFACE ANTIBODY,QUALITATIVE: Hep B S Ab: NONREACTIVE

## 2020-10-11 LAB — GAMMA GT: GGT: 247 U/L — ABNORMAL HIGH (ref 7–50)

## 2020-10-11 LAB — HEPATITIS B CORE ANTIBODY, TOTAL: Hep B Core Total Ab: NONREACTIVE

## 2020-10-11 LAB — HEPATITIS C ANTIBODY: HCV Ab: NONREACTIVE

## 2020-10-13 NOTE — Progress Notes (Signed)
Spoke with Dr. Abbey Chatters.   Please ask patient if she is having any itching.   Dr. Abbey Chatters recommends we proceed with MRI/MRCP to evaluate her bile ducts. We very well may end up needing a liver biopsy. We are evaluating for 2 liver conditions, Primary Sclerosing Cholangitis and Primary Biliary Cholangitis. Both of these condition affect the biliary system of the liver and can cause elevated alk phos.   [facility] Clinical Pool: Please arrange MRI/MRCP with and without contrast. Dx: Elevated alk phos

## 2020-10-14 ENCOUNTER — Other Ambulatory Visit (HOSPITAL_COMMUNITY)
Admission: RE | Admit: 2020-10-14 | Discharge: 2020-10-14 | Disposition: A | Discharge status: Home / Self Care | Payer: 59 | Source: Ambulatory Visit | Attending: Internal Medicine | Admitting: Internal Medicine

## 2020-10-14 ENCOUNTER — Other Ambulatory Visit: Payer: Self-pay

## 2020-10-14 DIAGNOSIS — Z20822 Contact with and (suspected) exposure to covid-19: Secondary | ICD-10-CM | POA: Insufficient documentation

## 2020-10-14 DIAGNOSIS — Z01812 Encounter for preprocedural laboratory examination: Secondary | ICD-10-CM | POA: Diagnosis present

## 2020-10-14 LAB — SARS CORONAVIRUS 2 (TAT 6-24 HRS): SARS Coronavirus 2: NEGATIVE

## 2020-10-17 ENCOUNTER — Other Ambulatory Visit: Payer: Self-pay

## 2020-10-17 ENCOUNTER — Encounter (HOSPITAL_COMMUNITY): Payer: Self-pay

## 2020-10-17 ENCOUNTER — Ambulatory Visit (HOSPITAL_COMMUNITY): Payer: 59 | Admitting: Anesthesiology

## 2020-10-17 ENCOUNTER — Encounter (HOSPITAL_COMMUNITY)
Admission: RE | Disposition: A | Discharge status: Home / Self Care | Payer: Self-pay | Source: Home / Self Care | Attending: Internal Medicine

## 2020-10-17 ENCOUNTER — Ambulatory Visit (HOSPITAL_COMMUNITY)
Admission: RE | Admit: 2020-10-17 | Discharge: 2020-10-17 | Disposition: A | Discharge status: Home / Self Care | Payer: 59 | Attending: Internal Medicine | Admitting: Internal Medicine

## 2020-10-17 DIAGNOSIS — I129 Hypertensive chronic kidney disease with stage 1 through stage 4 chronic kidney disease, or unspecified chronic kidney disease: Secondary | ICD-10-CM | POA: Diagnosis not present

## 2020-10-17 DIAGNOSIS — N189 Chronic kidney disease, unspecified: Secondary | ICD-10-CM | POA: Insufficient documentation

## 2020-10-17 DIAGNOSIS — K635 Polyp of colon: Secondary | ICD-10-CM

## 2020-10-17 DIAGNOSIS — Z886 Allergy status to analgesic agent status: Secondary | ICD-10-CM | POA: Diagnosis not present

## 2020-10-17 DIAGNOSIS — D123 Benign neoplasm of transverse colon: Secondary | ICD-10-CM | POA: Diagnosis not present

## 2020-10-17 DIAGNOSIS — R748 Abnormal levels of other serum enzymes: Secondary | ICD-10-CM

## 2020-10-17 DIAGNOSIS — Z1211 Encounter for screening for malignant neoplasm of colon: Secondary | ICD-10-CM

## 2020-10-17 DIAGNOSIS — D122 Benign neoplasm of ascending colon: Secondary | ICD-10-CM | POA: Insufficient documentation

## 2020-10-17 DIAGNOSIS — K648 Other hemorrhoids: Secondary | ICD-10-CM | POA: Diagnosis not present

## 2020-10-17 DIAGNOSIS — Z79899 Other long term (current) drug therapy: Secondary | ICD-10-CM | POA: Diagnosis not present

## 2020-10-17 HISTORY — PX: POLYPECTOMY: SHX5525

## 2020-10-17 HISTORY — PX: COLONOSCOPY WITH PROPOFOL: SHX5780

## 2020-10-17 SURGERY — COLONOSCOPY WITH PROPOFOL
Anesthesia: General

## 2020-10-17 MED ORDER — LACTATED RINGERS IV SOLN: Freq: Once | INTRAVENOUS | Status: AC

## 2020-10-17 MED ORDER — LACTATED RINGERS IV SOLN: INTRAVENOUS | Status: DC | PRN

## 2020-10-17 MED ORDER — CHLORHEXIDINE GLUCONATE CLOTH 2 % EX PADS: 6.0000 | MEDICATED_PAD | Freq: Once | CUTANEOUS | Status: DC

## 2020-10-17 MED ORDER — PROPOFOL 500 MG/50ML IV EMUL
INTRAVENOUS | Status: DC | PRN
  Administered 2020-10-17: 100 ug/kg/min via INTRAVENOUS

## 2020-10-17 MED ORDER — PROPOFOL 10 MG/ML IV BOLUS
INTRAVENOUS | Status: DC | PRN
  Administered 2020-10-17: 120 mg via INTRAVENOUS

## 2020-10-17 MED ORDER — STERILE WATER FOR IRRIGATION IR SOLN
Status: DC | PRN
  Administered 2020-10-17: 100 mL

## 2020-10-17 NOTE — Transfer of Care (Signed)
Immediate Anesthesia Transfer of Care Note  Patient: Patricia Wu  Procedure(s) Performed: COLONOSCOPY WITH PROPOFOL (N/A ) POLYPECTOMY  Patient Location: Endoscopy Unit  Anesthesia Type:General  Level of Consciousness: awake and patient cooperative  Airway & Oxygen Therapy: Patient Spontanous Breathing  Post-op Assessment: Report given to RN and Post -op Vital signs reviewed and stable  Post vital signs: Reviewed and stable  Last Vitals:  Vitals Value Taken Time  BP    Temp    Pulse    Resp    SpO2      Last Pain:  Vitals:   10/17/20 1343  TempSrc:   PainSc: 0-No pain      Patients Stated Pain Goal: 8 (29/51/88 4166)  Complications: No complications documented.Patient denies nausea

## 2020-10-17 NOTE — Anesthesia Postprocedure Evaluation (Signed)
Anesthesia Post Note  Patient: Patricia Wu  Procedure(s) Performed: COLONOSCOPY WITH PROPOFOL (N/A ) POLYPECTOMY  Patient location during evaluation: PACU Anesthesia Type: General Level of consciousness: awake and alert and patient cooperative Pain management: satisfactory to patient Vital Signs Assessment: post-procedure vital signs reviewed and stable Respiratory status: spontaneous breathing Cardiovascular status: stable Postop Assessment: no apparent nausea or vomiting Anesthetic complications: no   No complications documented.   Last Vitals:  Vitals:   10/17/20 1229 10/17/20 1407  BP: (!) 142/80 123/65  Pulse: 72   Resp: (!) 21 (!) 26  Temp: 36.7 C 36.8 C  SpO2: 100% 100%    Last Pain:  Vitals:   10/17/20 1407  TempSrc: Oral  PainSc: 0-No pain                 Mort Smelser

## 2020-10-17 NOTE — Anesthesia Procedure Notes (Signed)
Date/Time: 10/17/2020 1:43 PM Performed by: Vista Deck, CRNA Pre-anesthesia Checklist: Patient identified, Emergency Drugs available, Suction available, Timeout performed and Patient being monitored Patient Re-evaluated:Patient Re-evaluated prior to induction Oxygen Delivery Method: Nasal Cannula

## 2020-10-17 NOTE — Anesthesia Preprocedure Evaluation (Signed)
Anesthesia Evaluation  Patient identified by MRN, date of birth, ID band Patient awake    Reviewed: Allergy & Precautions, NPO status , Patient's Chart, lab work & pertinent test results  History of Anesthesia Complications Negative for: history of anesthetic complications  Airway Mallampati: II  TM Distance: >3 FB Neck ROM: Full    Dental  (+) Missing, Dental Advisory Given   Pulmonary neg pulmonary ROS,    Pulmonary exam normal breath sounds clear to auscultation       Cardiovascular hypertension, Pt. on medications Normal cardiovascular exam Rhythm:Regular Rate:Normal     Neuro/Psych negative neurological ROS  negative psych ROS   GI/Hepatic negative GI ROS, Neg liver ROS,   Endo/Other  negative endocrine ROS  Renal/GU Renal disease     Musculoskeletal negative musculoskeletal ROS (+)   Abdominal   Peds  Hematology negative hematology ROS (+)   Anesthesia Other Findings   Reproductive/Obstetrics negative OB ROS                             Anesthesia Physical Anesthesia Plan  ASA: II  Anesthesia Plan: General   Post-op Pain Management:    Induction: Intravenous  PONV Risk Score and Plan: TIVA  Airway Management Planned: Natural Airway, Nasal Cannula and Simple Face Mask  Additional Equipment:   Intra-op Plan:   Post-operative Plan:   Informed Consent: I have reviewed the patients History and Physical, chart, labs and discussed the procedure including the risks, benefits and alternatives for the proposed anesthesia with the patient or authorized representative who has indicated his/her understanding and acceptance.     Dental advisory given  Plan Discussed with: CRNA and Surgeon  Anesthesia Plan Comments:         Anesthesia Quick Evaluation

## 2020-10-17 NOTE — H&P (Signed)
Primary Care Physician:  Wendie Agreste, MD Primary Gastroenterologist:  Dr. Abbey Chatters  Pre-Procedure History & Physical: HPI:  Patricia Wu is a 64 y.o. female is here for a colonoscopy for colon cancer screening purposes.  Patient denies any family history of colorectal cancer.  No melena or hematochezia.  No abdominal pain or unintentional weight loss.  No change in bowel habits.  Overall feels well from a GI standpoint.  Past Medical History:  Diagnosis Date  . CKD (chronic kidney disease)   . Hypercholesteremia   . Hypertension   . Noncompliance with medication regimen     History reviewed. No pertinent surgical history.  Prior to Admission medications   Medication Sig Start Date End Date Taking? Authorizing Provider  amLODipine (NORVASC) 10 MG tablet Take 1 tablet (10 mg total) by mouth daily. Patient taking differently: Take 10 mg by mouth daily at 8 pm.  08/10/20  Yes Wendie Agreste, MD  atorvastatin (LIPITOR) 10 MG tablet Take 1 tablet (10 mg total) by mouth daily. Patient taking differently: Take 10 mg by mouth daily at 8 pm.  08/10/20  Yes Wendie Agreste, MD  lisinopril-hydrochlorothiazide (ZESTORETIC) 20-12.5 MG tablet Take 1 tablet by mouth daily. Patient taking differently: Take 1 tablet by mouth daily at 8 pm.  08/10/20  Yes Wendie Agreste, MD  acetaminophen (TYLENOL) 325 MG tablet Take 325-650 mg by mouth every 6 (six) hours as needed (pain.).     [provider]    Allergies as of 09/27/2020 - Review Complete 09/21/2020  Allergen Reaction Noted  . Ibuprofen Other (See Comments) 04/29/2018    Family History  Problem Relation Age of Onset  . Liver cancer Neg Hx   . Colon cancer Neg Hx     Social History   Socioeconomic History  . Marital status: Married    Spouse name: Not on file  . Number of children: 2  . Years of education: Not on file  . Highest education level: Not on file  Occupational History  . Not on file  Tobacco Use  .  Smoking status: Never Smoker  . Smokeless tobacco: Never Used  Substance and Sexual Activity  . Alcohol use: No  . Drug use: No  . Sexual activity: Yes  Other Topics Concern  . Not on file  Social History Narrative  . Not on file   Social Determinants of Health   Financial Resource Strain:   . Difficulty of Paying Living Expenses: Not on file  Food Insecurity:   . Worried About Charity fundraiser in the Last Year: Not on file  . Ran Out of Food in the Last Year: Not on file  Transportation Needs:   . Lack of Transportation (Medical): Not on file  . Lack of Transportation (Non-Medical): Not on file  Physical Activity:   . Days of Exercise per Week: Not on file  . Minutes of Exercise per Session: Not on file  Stress:   . Feeling of Stress : Not on file  Social Connections:   . Frequency of Communication with Friends and Family: Not on file  . Frequency of Social Gatherings with Friends and Family: Not on file  . Attends Religious Services: Not on file  . Active Member of Clubs or Organizations: Not on file  . Attends Archivist Meetings: Not on file  . Marital Status: Not on file  Intimate Partner Violence:   . Fear of Current or Ex-Partner: Not on file  .  Emotionally Abused: Not on file  . Physically Abused: Not on file  . Sexually Abused: Not on file    Review of Systems: See HPI, otherwise negative ROS  Impression/Plan: Patricia Wu is here for a colonoscopy to be performed for colon cancer screening purposes.  The risks of the procedure including infection, bleed, or perforation as well as benefits, limitations, alternatives and imponderables have been reviewed with the patient. Questions have been answered. All parties agreeable.

## 2020-10-17 NOTE — Op Note (Signed)
[facility] Patient Name: Patricia Wu Procedure Date: 10/17/2020 1:33 PM MRN: [id number] Date of Birth: March 07, 1956 Attending MD: Elon Alas. Abbey Chatters DO CSN: [id number] Age: 64 Admit Type: Outpatient Procedure:                Colonoscopy Indications:              Screening for colorectal malignant neoplasm Providers:                Elon Alas. Abbey Chatters, DO, Crystal Page, Randa Spike, Technician Referring MD:              Medicines:                See the Anesthesia note for documentation of the                            administered medications Complications:            No immediate complications. Estimated Blood Loss:     Estimated blood loss was minimal. Procedure:                Pre-Anesthesia Assessment:                           - The anesthesia plan was to use monitored                            anesthesia care (MAC).                           After obtaining informed consent, the colonoscope                            was passed under direct vision. Throughout the                            procedure, the patient's blood pressure, pulse, and                            oxygen saturations were monitored continuously. The                            [id number]-[id number] ([id number]) scope was introduced through                            the anus and advanced to the the cecum, identified                            by appendiceal orifice and ileocecal valve. The                            colonoscopy was performed without difficulty. The                            patient tolerated the procedure well. The quality  of the bowel preparation was evaluated using the                            BBPS [facility] Bowel Preparation Scale) with scores                            of: Right Colon = 3, Transverse Colon = 3 and Left                            Colon = 3 (entire mucosa seen well with no residual                            staining,  small fragments of stool or opaque                            liquid). The total BBPS score equals 9. Scope In: 1:50:44 PM Scope Out: 2:03:30 PM Scope Withdrawal Time: 0 hours 9 minutes 30 seconds  Total Procedure Duration: 0 hours 12 minutes 46 seconds  Findings:      The perianal and digital rectal examinations were normal.      Non-bleeding internal hemorrhoids were found during endoscopy.      A 4 mm polyp was found in the ascending colon. The polyp was sessile.       The polyp was removed with a cold snare. Resection and retrieval were       complete.      A 3 mm polyp was found in the transverse colon. The polyp was sessile.       The polyp was removed with a cold snare. Resection and retrieval were       complete.      The exam was otherwise without abnormality. Impression:               - Non-bleeding internal hemorrhoids.                           - One 4 mm polyp in the ascending colon, removed                            with a cold snare. Resected and retrieved.                           - One 3 mm polyp in the transverse colon, removed                            with a cold snare. Resected and retrieved.                           - The examination was otherwise normal. Moderate Sedation:      Per Anesthesia Care Recommendation:           - Patient has a contact number available for                            emergencies. The signs and symptoms of potential  delayed complications were discussed with the                            patient. Return to normal activities tomorrow.                            Written discharge instructions were provided to the                            patient.                           - Resume previous diet.                           - Continue present medications.                           - Await pathology results.                           - Repeat colonoscopy in 5 years for surveillance.                            - Return to GI clinic as previously scheduled. Procedure Code(s):        --- Professional ---                           [phone], Colonoscopy, flexible; with removal of                            tumor(s), polyp(s), or other lesion(s) by snare                            technique Diagnosis Code(s):        --- Professional ---                           Z12.11, Encounter for screening for malignant                            neoplasm of colon                           K63.5, Polyp of colon                           K64.8, Other hemorrhoids CPT copyright 2019 American Medical Association. All rights reserved. The codes documented in this report are preliminary and upon coder review may  be revised to meet current compliance requirements. Elon Alas. Abbey Chatters, DO [place] Abbey Chatters, DO 10/17/2020 2:07:01 PM This report has been signed electronically. Number of Addenda: 0

## 2020-10-17 NOTE — Discharge Instructions (Addendum)
Colonoscopy Discharge Instructions  Read the instructions outlined below and refer to this sheet in the next few weeks. These discharge instructions provide you with general information on caring for yourself after you leave the hospital. Your doctor may also give you specific instructions. While your treatment has been planned according to the most current medical practices available, unavoidable complications occasionally occur.   ACTIVITY  You may resume your regular activity, but move at a slower pace for the next 24 hours.   Take frequent rest periods for the next 24 hours.   Walking will help get rid of the air and reduce the bloated feeling in your belly (abdomen).   No driving for 24 hours (because of the medicine (anesthesia) used during the test).    Do not sign any important legal documents or operate any machinery for 24 hours (because of the anesthesia used during the test).  NUTRITION  Drink plenty of fluids.   You may resume your normal diet as instructed by your doctor.   Begin with a light meal and progress to your normal diet. Heavy or fried foods are harder to digest and may make you feel sick to your stomach (nauseated).   Avoid alcoholic beverages for 24 hours or as instructed.  MEDICATIONS  You may resume your normal medications unless your doctor tells you otherwise.  WHAT YOU CAN EXPECT TODAY  Some feelings of bloating in the abdomen.   Passage of more gas than usual.   Spotting of blood in your stool or on the toilet paper.  IF YOU HAD POLYPS REMOVED DURING THE COLONOSCOPY:  No aspirin products for 7 days or as instructed.   No alcohol for 7 days or as instructed.   Eat a soft diet for the next 24 hours.  FINDING OUT THE RESULTS OF YOUR TEST Not all test results are available during your visit. If your test results are not back during the visit, make an appointment with your caregiver to find out the results. Do not assume everything is normal if  you have not heard from your caregiver or the medical facility. It is important for you to follow up on all of your test results.  SEEK IMMEDIATE MEDICAL ATTENTION IF:  You have more than a spotting of blood in your stool.   Your belly is swollen (abdominal distention).   You are nauseated or vomiting.   You have a temperature over 101.   You have abdominal pain or discomfort that is severe or gets worse throughout the day.   Your colonoscopy revealed 2 polyp(s) which I removed successfully. Await pathology results, my office will contact you. I recommend repeating colonoscopy in 5 years for surveillance purposes. Follow up with Aliene Altes as previously scheduled.   I hope you have a great rest of your week!  Elon Alas. Abbey Chatters, D.O. Gastroenterology and Hepatology [facility]   Colon Polyps  Polyps are tissue growths inside the body. Polyps can grow in many places, including the large intestine (colon). A polyp may be a round bump or a mushroom-shaped growth. You could have one polyp or several. Most colon polyps are noncancerous (benign). However, some colon polyps can become cancerous over time. Finding and removing the polyps early can help prevent this. What are the causes? The exact cause of colon polyps is not known. What increases the risk? You are more likely to develop this condition if you:  Have a family history of colon cancer or colon polyps.  Are older than 25 or older than 45 if you are African American.  Have inflammatory bowel disease, such as ulcerative colitis or Crohn's disease.  Have certain hereditary conditions, such as: ? Familial adenomatous polyposis. ? Lynch syndrome. ? Turcot syndrome. ? Peutz-Jeghers syndrome.  Are overweight.  Smoke cigarettes.  Do not get enough exercise.  Drink too much alcohol.  Eat a diet that is high in fat and red meat and low in fiber.  Had childhood cancer that was treated with  abdominal radiation. What are the signs or symptoms? Most polyps do not cause symptoms. If you have symptoms, they may include:  Blood coming from your rectum when having a bowel movement.  Blood in your stool. The stool may look dark red or black.  Abdominal pain.  A change in bowel habits, such as constipation or diarrhea. How is this diagnosed? This condition is diagnosed with a colonoscopy. This is a procedure in which a lighted, flexible scope is inserted into the anus and then passed into the colon to examine the area. Polyps are sometimes found when a colonoscopy is done as part of routine cancer screening tests. How is this treated? Treatment for this condition involves removing any polyps that are found. Most polyps can be removed during a colonoscopy. Those polyps will then be tested for cancer. Additional treatment may be needed depending on the results of testing. Follow these instructions at home: Lifestyle  Maintain a healthy weight, or lose weight if recommended by your health care provider.  Exercise every day or as told by your health care provider.  Do not use any products that contain nicotine or tobacco, such as cigarettes and e-cigarettes. If you need help quitting, ask your health care provider.  If you drink alcohol, limit how much you have: ? 0-1 drink a day for women. ? 0-2 drinks a day for men.  Be aware of how much alcohol is in your drink. In the [place] one drink equals one 12 oz bottle of beer (355 mL), one 5 oz glass of wine (148 mL), or one 1 oz shot of hard liquor (44 mL). Eating and drinking   Eat foods that are high in fiber, such as fruits, vegetables, and whole grains.  Eat foods that are high in calcium and vitamin D, such as milk, cheese, yogurt, eggs, liver, fish, and broccoli.  Limit foods that are high in fat, such as fried foods and desserts.  Limit the amount of red meat and processed meat you eat, such as hot dogs, sausage, bacon, and  lunch meats. General instructions  Keep all follow-up visits as told by your health care provider. This is important. ? This includes having regularly scheduled colonoscopies. ? Talk to your health care provider about when you need a colonoscopy. Contact a health care provider if:  You have new or worsening bleeding during a bowel movement.  You have new or increased blood in your stool.  You have a change in bowel habits.  You lose weight for no known reason. Summary  Polyps are tissue growths inside the body. Polyps can grow in many places, including the colon.  Most colon polyps are noncancerous (benign), but some can become cancerous over time.  This condition is diagnosed with a colonoscopy.  Treatment for this condition involves removing any polyps that are found. Most polyps can be removed during a colonoscopy. This information is not intended to replace advice given to you by your health care provider. Make sure  you discuss any questions you have with your health care provider. Document Revised: 03/27/2018 Document Reviewed: 03/27/2018 Elsevier Patient Education  [place].

## 2020-10-19 ENCOUNTER — Ambulatory Visit: Payer: 59 | Admitting: Gastroenterology

## 2020-10-19 LAB — SURGICAL PATHOLOGY

## 2020-10-19 NOTE — Progress Notes (Signed)
LM FOR PT TO RETURN CALL.  °

## 2020-10-20 ENCOUNTER — Encounter (HOSPITAL_COMMUNITY): Payer: Self-pay | Admitting: Internal Medicine

## 2020-10-26 ENCOUNTER — Other Ambulatory Visit: Payer: Self-pay | Admitting: Gastroenterology

## 2020-10-26 ENCOUNTER — Other Ambulatory Visit: Payer: Self-pay

## 2020-10-26 ENCOUNTER — Ambulatory Visit (HOSPITAL_COMMUNITY)
Admission: RE | Admit: 2020-10-26 | Discharge: 2020-10-26 | Disposition: A | Discharge status: Home / Self Care | Payer: 59 | Source: Ambulatory Visit | Attending: Gastroenterology | Admitting: Gastroenterology

## 2020-10-26 DIAGNOSIS — R748 Abnormal levels of other serum enzymes: Secondary | ICD-10-CM

## 2020-10-26 MED ORDER — GADOBUTROL 1 MMOL/ML IV SOLN
10.0000 mL | Freq: Once | INTRAVENOUS | Status: AC | PRN
  Administered 2020-10-26: 10 mL via INTRAVENOUS

## 2020-11-07 ENCOUNTER — Telehealth: Payer: Self-pay | Admitting: *Deleted

## 2020-11-07 ENCOUNTER — Other Ambulatory Visit (HOSPITAL_COMMUNITY): Payer: Self-pay | Admitting: Family Medicine

## 2020-11-07 DIAGNOSIS — Z1231 Encounter for screening mammogram for malignant neoplasm of breast: Secondary | ICD-10-CM

## 2020-11-07 NOTE — Telephone Encounter (Signed)
Schedule mammogram Mobile unit

## 2020-11-16 ENCOUNTER — Ambulatory Visit (HOSPITAL_COMMUNITY)
Admission: RE | Admit: 2020-11-16 | Discharge: 2020-11-16 | Disposition: A | Discharge status: Home / Self Care | Payer: 59 | Source: Ambulatory Visit | Attending: Family Medicine | Admitting: Family Medicine

## 2020-11-16 ENCOUNTER — Other Ambulatory Visit: Payer: Self-pay

## 2020-11-16 DIAGNOSIS — Z1231 Encounter for screening mammogram for malignant neoplasm of breast: Secondary | ICD-10-CM | POA: Diagnosis not present

## 2020-11-21 ENCOUNTER — Other Ambulatory Visit (HOSPITAL_COMMUNITY): Payer: Self-pay | Admitting: Family Medicine

## 2020-11-21 DIAGNOSIS — R928 Other abnormal and inconclusive findings on diagnostic imaging of breast: Secondary | ICD-10-CM

## 2020-11-28 ENCOUNTER — Telehealth: Payer: Self-pay | Admitting: Family Medicine

## 2020-11-28 NOTE — Telephone Encounter (Signed)
Patricia Wu with [facility] calling to let us know that she is needing to come  back for diagnostic mammogram for RGT breast / unable to reach patient

## 2020-11-29 ENCOUNTER — Other Ambulatory Visit: Payer: Self-pay

## 2020-11-29 ENCOUNTER — Ambulatory Visit (HOSPITAL_COMMUNITY)
Admission: RE | Admit: 2020-11-29 | Discharge: 2020-11-29 | Disposition: A | Discharge status: Home / Self Care | Payer: 59 | Source: Ambulatory Visit | Attending: Family Medicine | Admitting: Family Medicine

## 2020-11-29 DIAGNOSIS — R928 Other abnormal and inconclusive findings on diagnostic imaging of breast: Secondary | ICD-10-CM | POA: Insufficient documentation

## 2020-12-29 IMAGING — MG MM DIGITAL DIAGNOSTIC UNILAT*R* W/ TOMO W/ CAD
8 series · 8 of 24 positions shown · non-contrast
Comparison: Baseline mammogram dated 11/16/2020.

CLINICAL DATA: Screening recall for right breast masses.

EXAM:
DIGITAL DIAGNOSTIC UNILATERAL RIGHT MAMMOGRAM WITH TOMO AND CAD;
ULTRASOUND RIGHT BREAST LIMITED

[R CC synth-2D (1 of 2)]
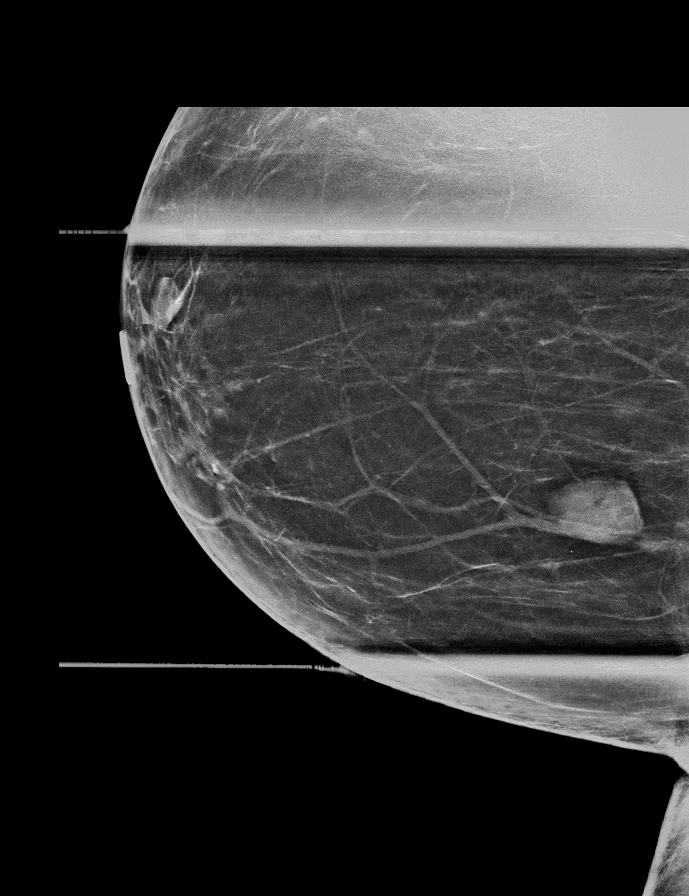

[R MLO synth-2D (1 of 2)]
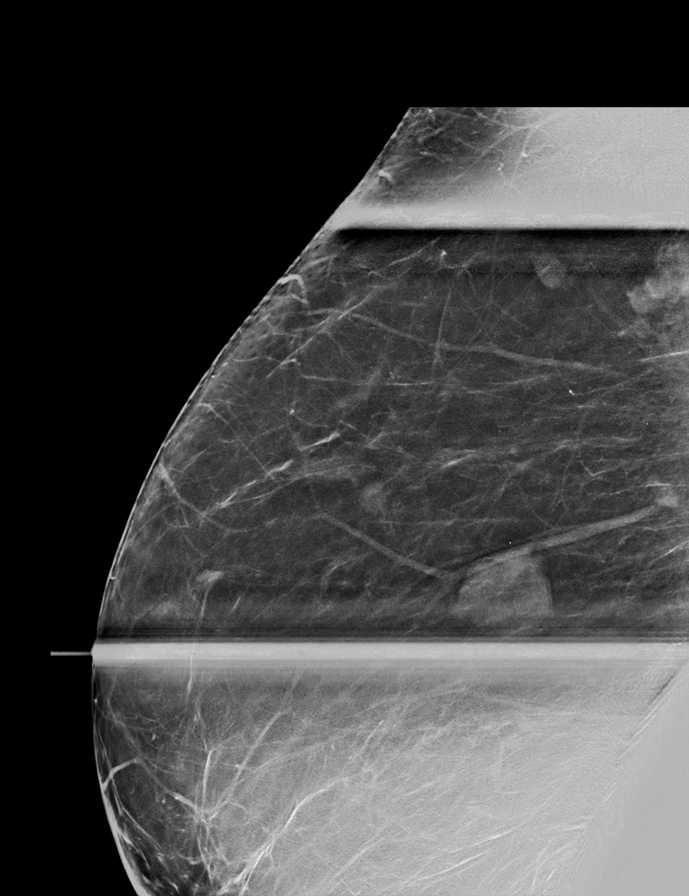

[R MLO synth-2D (2 of 2)]
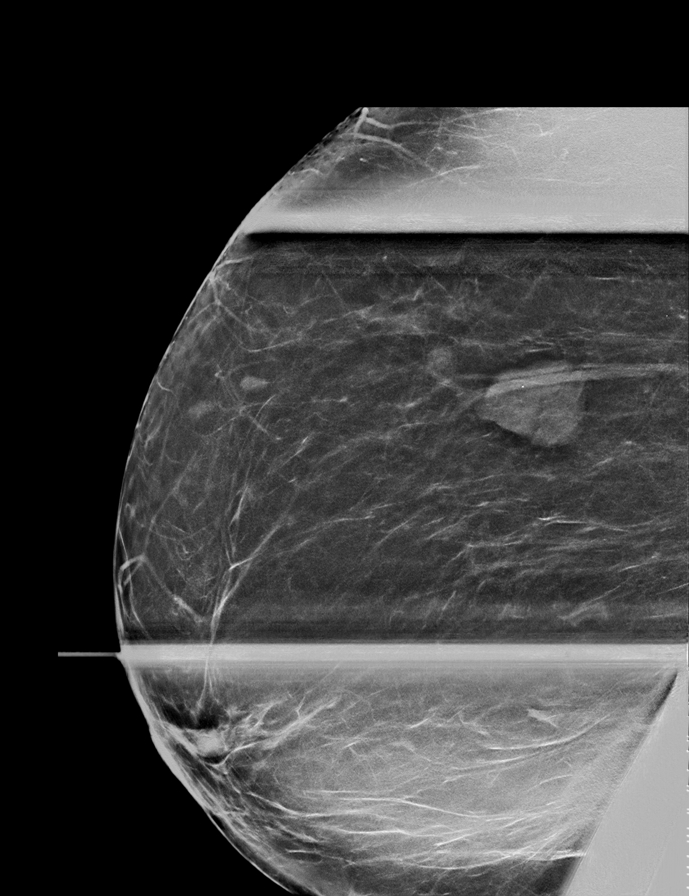

[R CC synth-2D (2 of 2)]
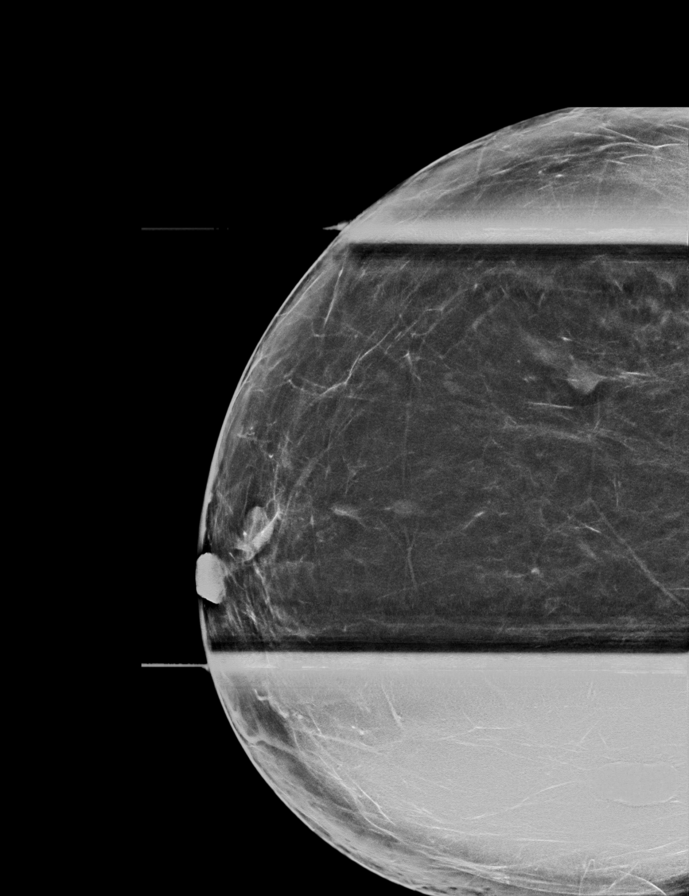

[R MLO tomo (1 of 2) · tomo slice 37/74.0]
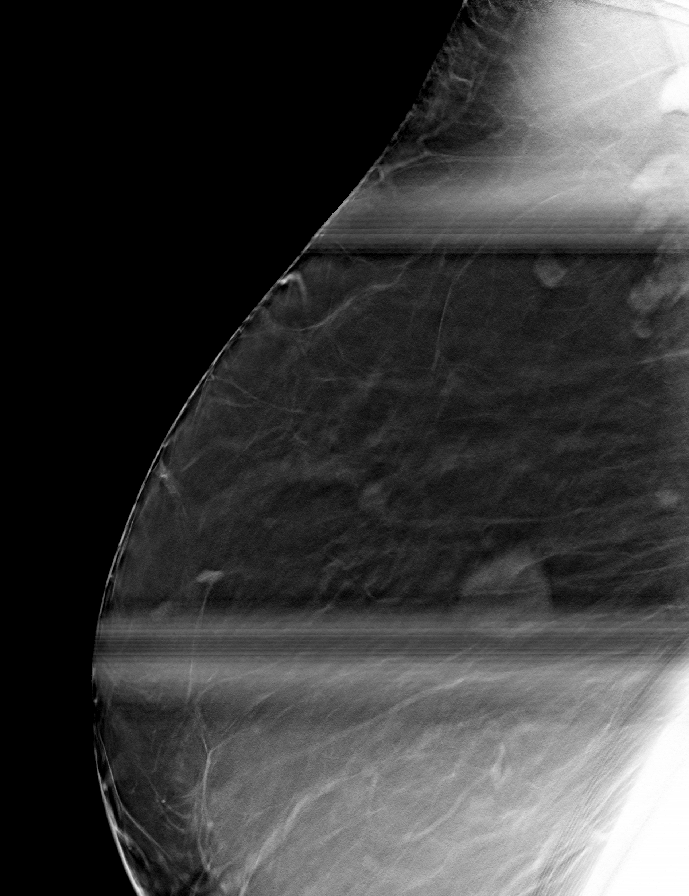

[R CC tomo (1 of 2) · tomo slice 34/67.0]
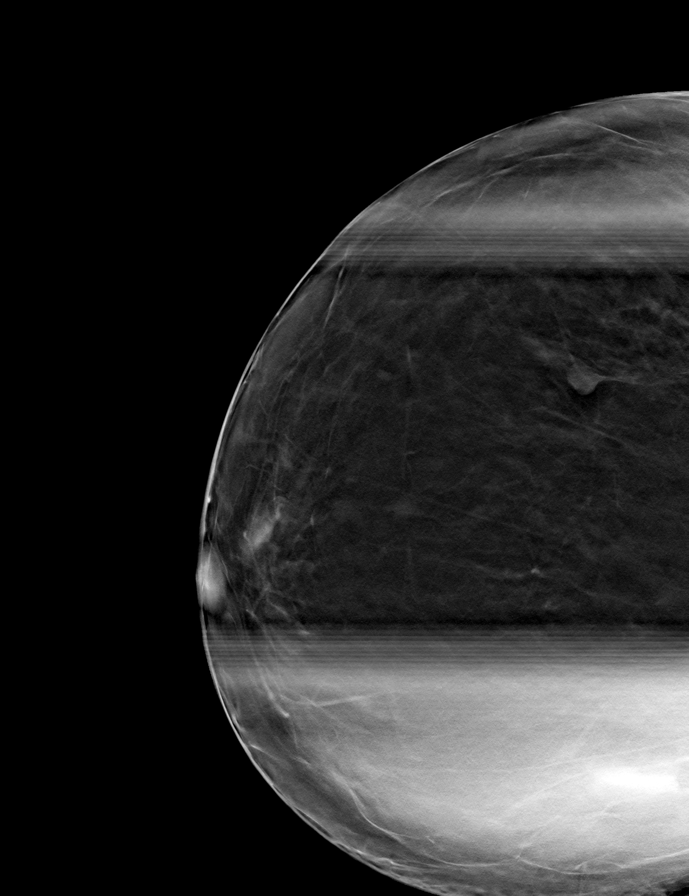

[R MLO tomo (2 of 2) · tomo slice 35/70.0]
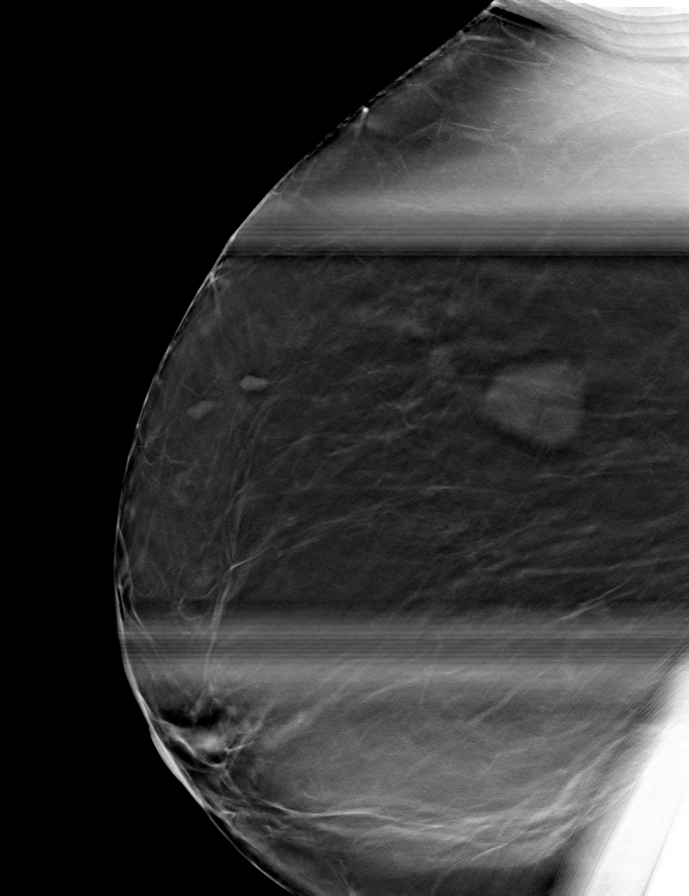

[R CC tomo (2 of 2) · tomo slice 33/65.0]
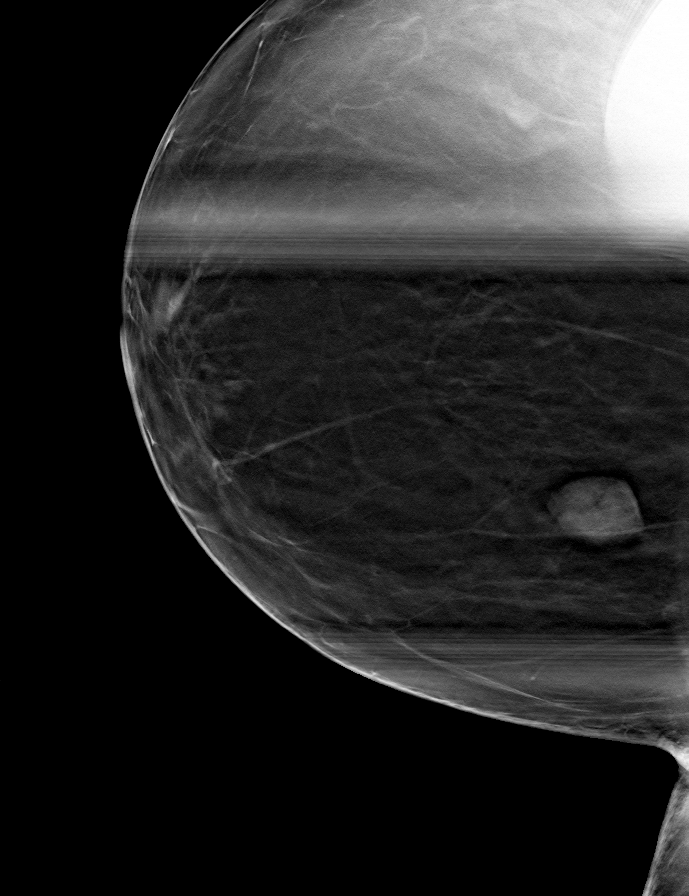

[8 of 24 positions shown; findings below may reference images not displayed]

ACR Breast Density Category b: There are scattered areas of
fibroglandular density.
FINDINGS: Spot compression tomograms were performed of the right breast. There
is a 0.8 cm oval mass in the upper-outer right breast. There is a
2.3 cm oval circumscribed mass in the upper slightly inner right
breast.

Mammographic images were processed with CAD.

Targeted ultrasound of the right breast was performed. There is an
oval circumscribed hypoechoic mass containing echogenic bands in the
right breast at the 1 o'clock position 5 cm from nipple measuring
2.4 x 0.7 x 2.2 cm. This corresponds well with the mass seen in the
upper inner right breast at mammography and demonstrates imaging
features most suggestive of a fibroadenoma. There is an oval
circumscribed hypoechoic mass in the right breast at the [DATE]
position 4 cm from nipple measuring 0.6 x 0.3 x 0.6 cm. An adjacent
similar appearing mass also at the [DATE] position 4 cm from nipple
measures 0.5 x 0.3 x 0.4 cm. These masses also demonstrate imaging
features suggestive of fibroadenomas.
IMPRESSION: Probably benign right breast masses.

RECOMMENDATION:
Recommend diagnostic mammography of the right breast in 6 months
with possible ultrasound.

I have discussed the findings and recommendations with the patient.
If applicable, a reminder letter will be sent to the patient
regarding the next appointment.

BI-RADS CATEGORY  3: Probably benign.

## 2020-12-29 IMAGING — US US BREAST*R* LIMITED INC AXILLA
1 series · 13 of 24 positions shown · non-contrast
Comparison: Baseline mammogram dated 11/16/2020.

CLINICAL DATA: Screening recall for right breast masses.

EXAM:
DIGITAL DIAGNOSTIC UNILATERAL RIGHT MAMMOGRAM WITH TOMO AND CAD;
ULTRASOUND RIGHT BREAST LIMITED

[Series 1: us breast*right* limited inc axilla · 0.07mm/px · 13 of 24 slices shown]
[im 1/24]
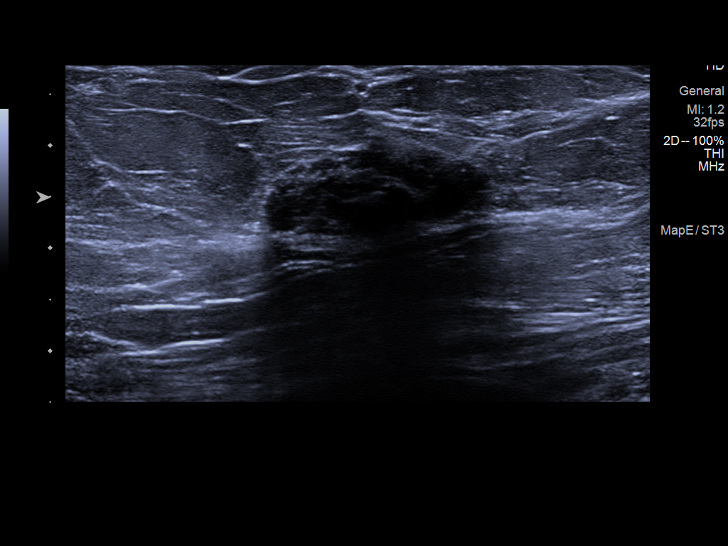
[im 3/24]
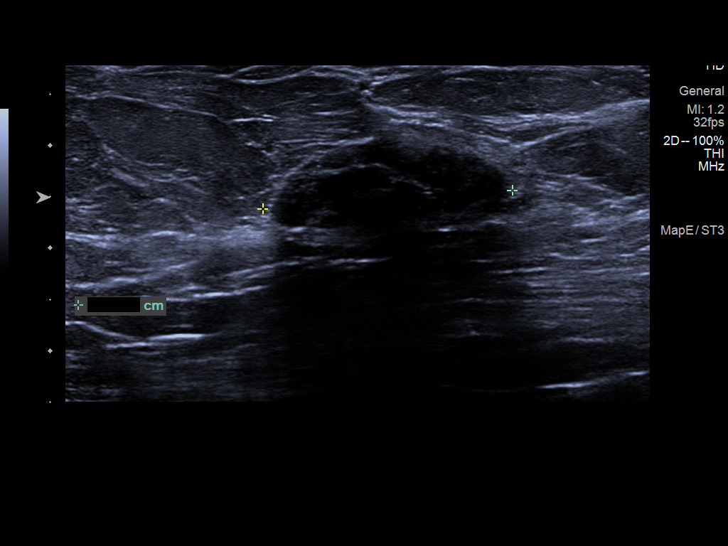
[im 5/24]
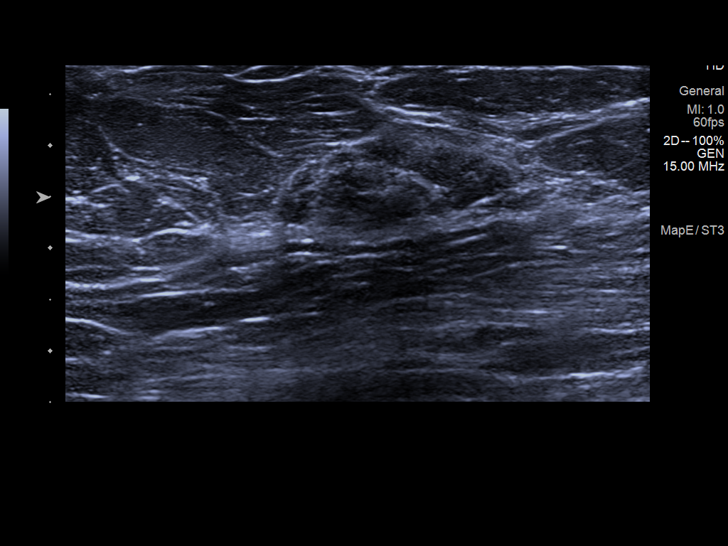
[im 7/24]
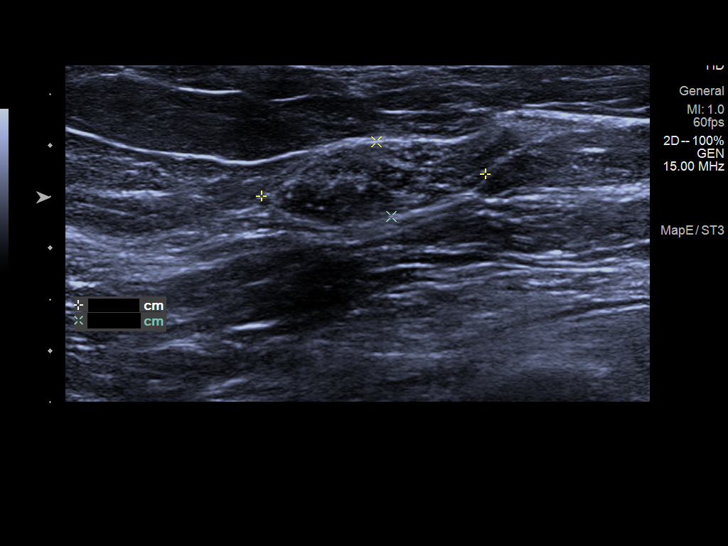
[im 9/24]
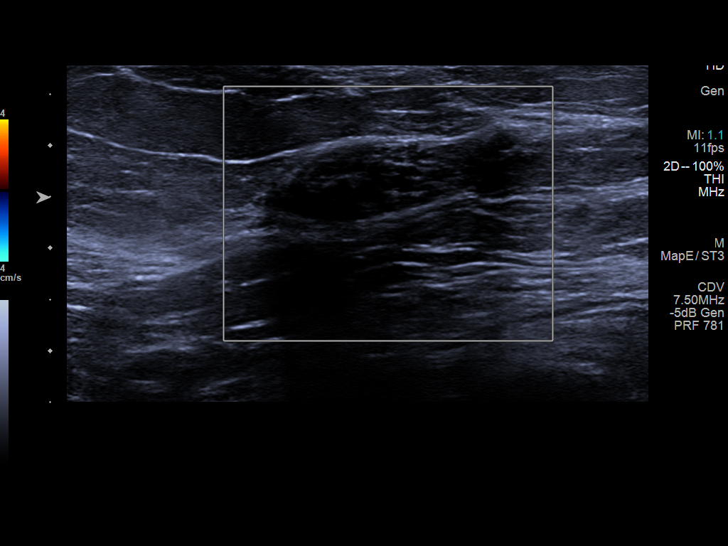
[im 11/24]
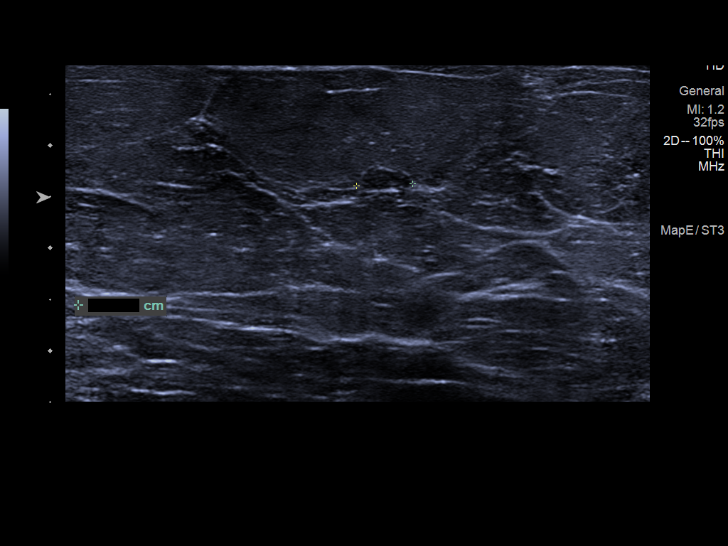
[im 13/24]
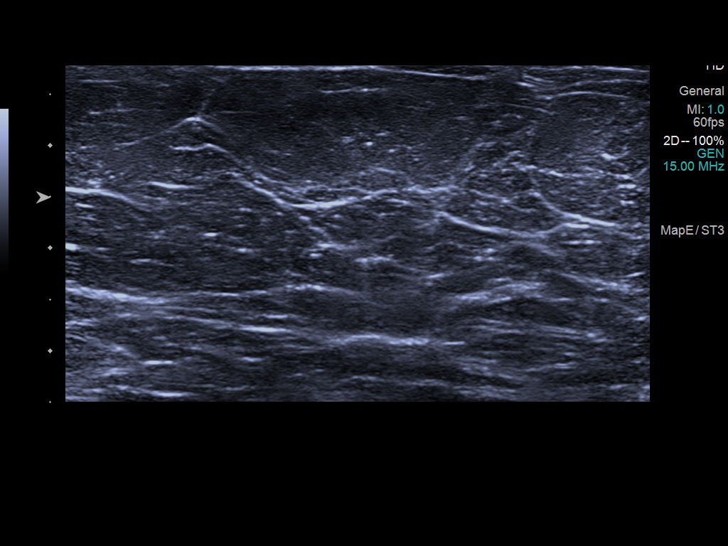
[im 14/24]
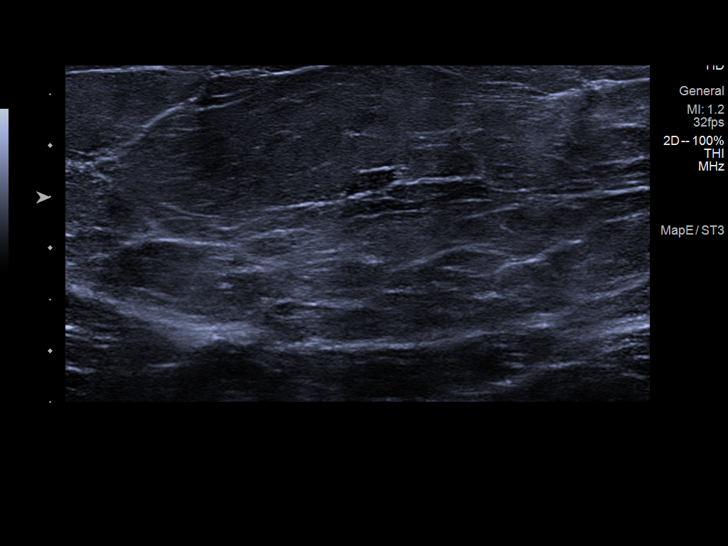
[im 16/24]
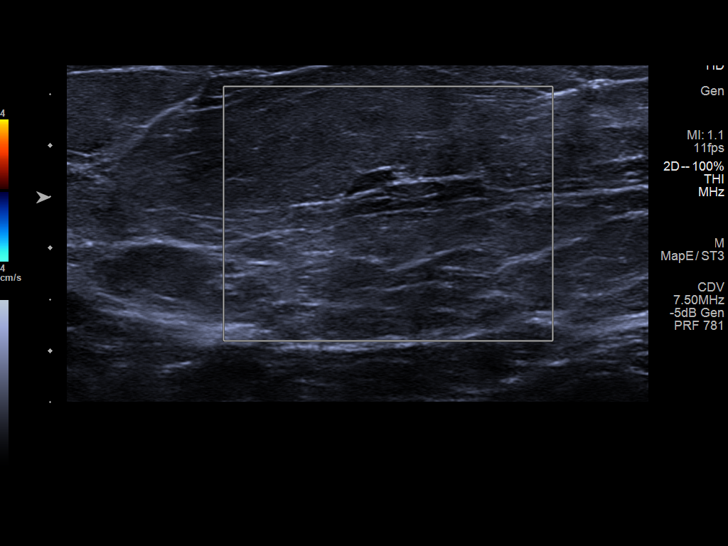
[im 18/24]
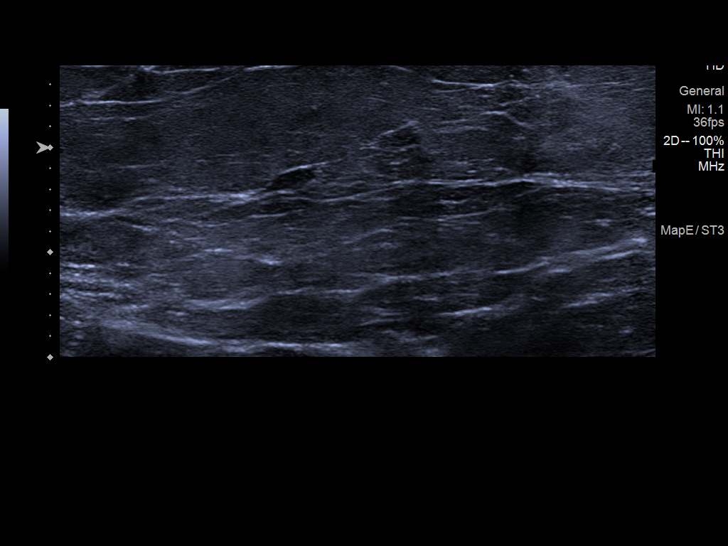
[im 20/24]
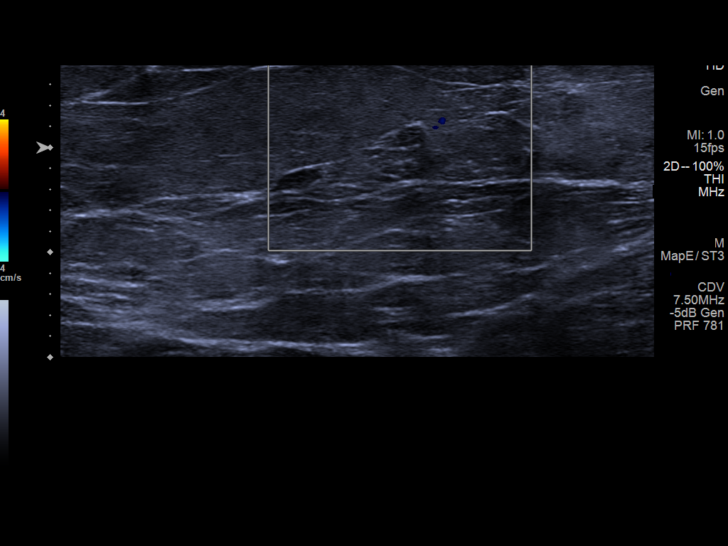
[im 22/24]
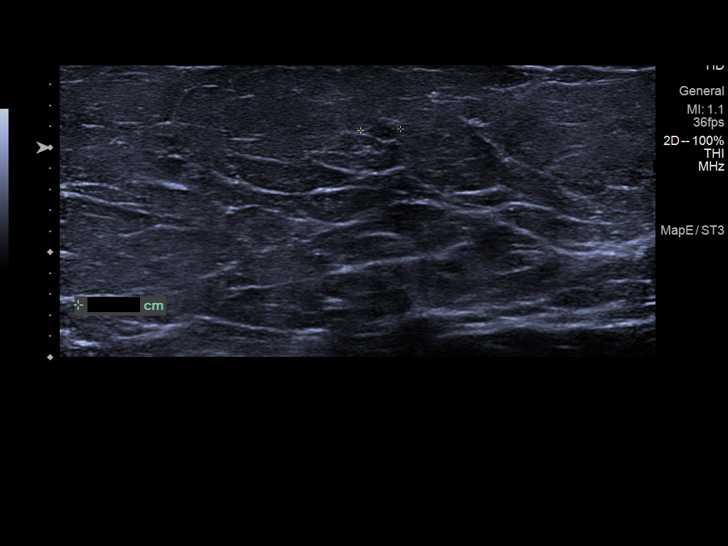
[im 24/24]
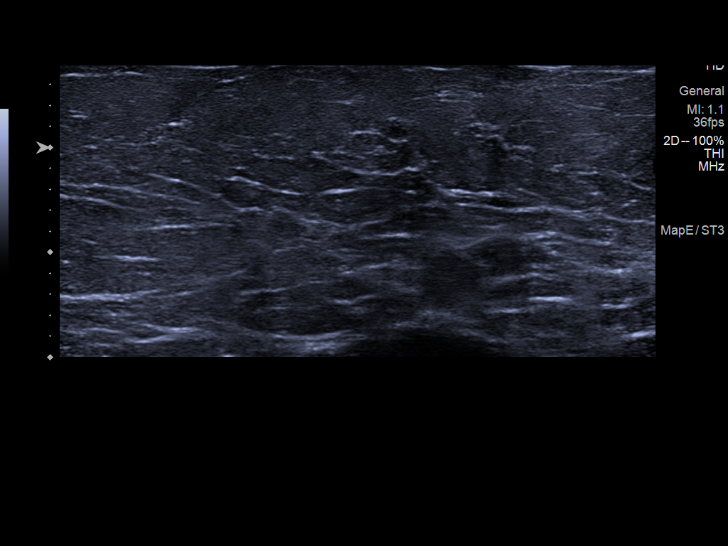

[13 of 24 positions shown; findings below may reference images not displayed]

ACR Breast Density Category b: There are scattered areas of
fibroglandular density.
FINDINGS: Spot compression tomograms were performed of the right breast. There
is a 0.8 cm oval mass in the upper-outer right breast. There is a
2.3 cm oval circumscribed mass in the upper slightly inner right
breast.

Mammographic images were processed with CAD.

Targeted ultrasound of the right breast was performed. There is an
oval circumscribed hypoechoic mass containing echogenic bands in the
right breast at the 1 o'clock position 5 cm from nipple measuring
2.4 x 0.7 x 2.2 cm. This corresponds well with the mass seen in the
upper inner right breast at mammography and demonstrates imaging
features most suggestive of a fibroadenoma. There is an oval
circumscribed hypoechoic mass in the right breast at the [DATE]
position 4 cm from nipple measuring 0.6 x 0.3 x 0.6 cm. An adjacent
similar appearing mass also at the [DATE] position 4 cm from nipple
measures 0.5 x 0.3 x 0.4 cm. These masses also demonstrate imaging
features suggestive of fibroadenomas.
IMPRESSION: Probably benign right breast masses.

RECOMMENDATION:
Recommend diagnostic mammography of the right breast in 6 months
with possible ultrasound.

I have discussed the findings and recommendations with the patient.
If applicable, a reminder letter will be sent to the patient
regarding the next appointment.

BI-RADS CATEGORY  3: Probably benign.

## 2021-01-18 NOTE — Telephone Encounter (Signed)
error 

## 2021-02-08 ENCOUNTER — Encounter: Payer: Self-pay | Admitting: Family Medicine

## 2021-02-08 ENCOUNTER — Other Ambulatory Visit: Payer: Self-pay

## 2021-02-08 ENCOUNTER — Ambulatory Visit: Payer: 59 | Admitting: Family Medicine

## 2021-02-08 VITALS — BP 136/82 | HR 78 | Temp 98.6°F | Ht 63.0 in | Wt 241.0 lb

## 2021-02-08 DIAGNOSIS — E785 Hyperlipidemia, unspecified: Secondary | ICD-10-CM | POA: Diagnosis not present

## 2021-02-08 DIAGNOSIS — R748 Abnormal levels of other serum enzymes: Secondary | ICD-10-CM | POA: Diagnosis not present

## 2021-02-08 DIAGNOSIS — I10 Essential (primary) hypertension: Secondary | ICD-10-CM | POA: Diagnosis not present

## 2021-02-08 MED ORDER — ATORVASTATIN CALCIUM 10 MG PO TABS: 10.0000 mg | ORAL_TABLET | Freq: Every day | ORAL | 2 refills | Status: DC

## 2021-02-08 MED ORDER — AMLODIPINE BESYLATE 10 MG PO TABS: 10.0000 mg | ORAL_TABLET | Freq: Every day | ORAL | 2 refills | Status: DC

## 2021-02-08 MED ORDER — LISINOPRIL-HYDROCHLOROTHIAZIDE 20-12.5 MG PO TABS: 1.0000 | ORAL_TABLET | Freq: Every day | ORAL | 2 refills | Status: DC

## 2021-02-08 NOTE — Progress Notes (Signed)
Subjective:  Patient ID: Patricia Wu, female    DOB: 05-Nov-1956  Age: 65 y.o. MRN: [id number]  CC:  Chief Complaint  Patient presents with  . Follow-up    On hypertension and hyperlipidemia. Pt reports no issues with BP since last OV. Pt reports her home BP readings have been within normal range. Pt reports medication seems to work well with no known side effects.    HPI Patricia Wu presents for   Hypertension: With CKD Creat range 1.86-2.38 since 2019.  Followed by nephrology with appointment in December, continued meds below. norvasc 71m qd, lisinopril HCT 20/12.539mqd.  No new side effects.  Home readings: 120/70-80 BP Readings from Last 3 Encounters:  02/08/21 136/82  10/17/20 123/65  09/21/20 136/80   Lab Results  Component Value Date   CREATININE 2.13 (H) 09/22/2020   Hyperlipidemia: lipitor 1048md. No new myalgia/side effects.   Lab Results  Component Value Date   CHOL 192 08/10/2020   HDL 43 08/10/2020   LDLCALC 129 (H) 08/10/2020   TRIG 109 08/10/2020   CHOLHDL 4.5 (H) 08/10/2020   Lab Results  Component Value Date   ALT 21 10/11/2020   AST 16 10/11/2020   GGT 247 (H) 10/11/2020   ALKPHOS 238 (H) 10/11/2020   BILITOT 0.8 10/11/2020   Elevated Alk Phos: See prior notes. Has been referred to GI, [facility] Labs/imaging noted.   Had ultrasound and subsequent MRI. No apparent cause.   Recommended liver biopsy, phone note from November 2021 with patient wanting to think about that first. Has not had liver biopsy. Has some questions regarding bx - will discuss with GI.  No n/v/abd pain.    History Patient Active Problem List   Diagnosis Date Noted  . Wu cancer screening 09/21/2020  . Elevated alkaline phosphatase level 09/21/2020  . Essential hypertension 02/18/2018  . Elevated brain natriuretic peptide (BNP) level 02/18/2018  . CKD (chronic kidney disease) stage  3, GFR 30-59 ml/min ([place]/26/2019   Past Medical History:  Diagnosis Date  . CKD (chronic kidney disease)   . Hypercholesteremia   . Hypertension   . Noncompliance with medication regimen    Past Surgical History:  Procedure Laterality Date  . COLONOSCOPY WITH PROPOFOL N/A 10/17/2020   Procedure: COLONOSCOPY WITH PROPOFOL;  Surgeon: CarEloise HarmanO;  [facility];  Service: Endoscopy;  Laterality: N/A;  1:00pm  . POLYPECTOMY  10/17/2020   Procedure: POLYPECTOMY;  Surgeon: CarEloise HarmanO;  [facility];  Service: Endoscopy;;   Allergies  Allergen Reactions  . Ibuprofen Other (See Comments)    pcp said raises her BP   Prior to Admission medications   Medication Sig Start Date End Date Taking? Authorizing Provider  acetaminophen (TYLENOL) 325 MG tablet Take 325-650 mg by mouth every 6 (six) hours as needed (pain.).    Yes [provider]  amLODipine (NORVASC) 10 MG tablet Take 1 tablet (10 mg total) by mouth daily. Patient taking differently: Take 10 mg by mouth daily at 8 pm. 08/10/20  Yes GreWendie AgresteD  atorvastatin (LIPITOR) 10 MG tablet Take 1 tablet (10 mg total) by mouth daily. Patient taking differently: Take 10 mg by mouth daily at 8 pm. 08/10/20  Yes GreWendie AgresteD  lisinopril-hydrochlorothiazide (ZESTORETIC) 20-12.5 MG tablet Take 1 tablet by mouth daily. Patient taking differently: Take 1 tablet by mouth daily at 8  pm. 08/10/20  Yes Wendie Agreste, MD   Social History   Socioeconomic History  . Marital status: Married    Spouse name: Not on file  . Number of children: 2  . Years of education: Not on file  . Highest education level: Not on file  Occupational History  . Not on file  Tobacco Use  . Smoking status: Never Smoker  . Smokeless tobacco: Never Used  Substance and Sexual Activity  . Alcohol use: No  . Drug use: No  . Sexual activity: Yes  Other Topics Concern  . Not on file  Social History  Narrative  . Not on file   Social Determinants of Health   Financial Resource Strain: Not on file  Food Insecurity: Not on file  Transportation Needs: Not on file  Physical Activity: Not on file  Stress: Not on file  Social Connections: Not on file  Intimate Partner Violence: Not on file    Review of Systems  Constitutional: Negative for fatigue and unexpected weight change.  Respiratory: Negative for chest tightness and shortness of breath.   Cardiovascular: Negative for chest pain, palpitations and leg swelling.  Gastrointestinal: Negative for abdominal pain and blood in stool.  Neurological: Negative for dizziness, syncope, light-headedness and headaches.  All other systems reviewed and are negative.    Objective:   Vitals:   02/08/21 0840 02/08/21 0842  BP: (!) 163/78 136/82  Pulse: 78   Temp: 98.6 F (37 C)   TempSrc: Temporal   SpO2: 100%   Weight: 241 lb (109.3 kg)   Height: 5' 3"  (1.6 m)      Physical Exam Vitals reviewed.  Constitutional:      Appearance: She is well-developed and well-nourished.  HENT:     Head: Normocephalic and atraumatic.  Eyes:     Extraocular Movements: EOM normal.     Conjunctiva/sclera: Conjunctivae normal.     Pupils: Pupils are equal, round, and reactive to light.  Neck:     Vascular: No carotid bruit.  Cardiovascular:     Rate and Rhythm: Normal rate and regular rhythm.     Pulses: Intact distal pulses.     Heart sounds: Normal heart sounds.  Pulmonary:     Effort: Pulmonary effort is normal.     Breath sounds: Normal breath sounds.  Abdominal:     Palpations: Abdomen is soft. There is no pulsatile mass.     Tenderness: There is no abdominal tenderness.  Skin:    General: Skin is warm and dry.  Neurological:     Mental Status: She is alert and oriented to person, place, and time.  Psychiatric:        Mood and Affect: Mood and affect normal.        Behavior: Behavior normal.      33 minutes spent during visit,  greater than 50% counseling and assimilation of information, chart review, and discussion of plan.    Assessment & Plan:  Patricia Wu is a 65 y.o. female . Hyperlipidemia, unspecified hyperlipidemia type - Plan: Lipid panel, Comprehensive metabolic panel, atorvastatin (LIPITOR) 10 MG tablet  -Tolerating current regimen, check labs.  Continue same. Essential hypertension - Plan: amLODipine (NORVASC) 10 MG tablet, lisinopril-hydrochlorothiazide (ZESTORETIC) 20-12.5 MG tablet  -Repeat BP improved control, improved control on home readings.  Monitor creatinine with history of CKD.  Maintain hydration, avoid NSAIDs/nephrotoxins.  Elevated alkaline phosphatase level  -Advised to discuss her concerns with liver biopsy, further evaluation of elevated alkaline  phosphatase with gastroenterology.  Phone number provided.  Meds ordered this encounter  Medications  . amLODipine (NORVASC) 10 MG tablet    Sig: Take 1 tablet (10 mg total) by mouth daily.    Dispense:  90 tablet    Refill:  2  . atorvastatin (LIPITOR) 10 MG tablet    Sig: Take 1 tablet (10 mg total) by mouth daily.    Dispense:  90 tablet    Refill:  2  . lisinopril-hydrochlorothiazide (ZESTORETIC) 20-12.5 MG tablet    Sig: Take 1 tablet by mouth daily.    Dispense:  90 tablet    Refill:  2   Patient Instructions    I would recommend calling [facility] to discuss questions about liver biopsy and further evaluation of elevated alkaline phosphatase.  [facility] Address: [address] [place], [place] [address] Phone: [phone]  Continue to avoid nsaids like alleve and make sure to drink sufficient fluids.   Recheck in 6 months, let me know if there are questions.  If you have lab work done today you will be contacted with your lab results within the next 2 weeks.  If you have not heard from [place] then please contact us. The fastest way to get your results is to register for My  Chart.   IF you received an x-ray today, you will receive an invoice from [facility]. Please contact [facility] at [phone] with questions or concerns regarding your invoice.   IF you received labwork today, you will receive an invoice from [place]. Please contact [facility] at [phone] with questions or concerns regarding your invoice.   Our billing staff will not be able to assist you with questions regarding bills from these companies.  You will be contacted with the lab results as soon as they are available. The fastest way to get your results is to activate your My Chart account. Instructions are located on the last page of this paperwork. If you have not heard from [place] regarding the results in 2 weeks, please contact this office.         Signed, Merri Ray, MD [facility]

## 2021-02-08 NOTE — Patient Instructions (Addendum)
  I would recommend calling [facility] to discuss questions about liver biopsy and further evaluation of elevated alkaline phosphatase.  [facility] Address: [address] [place], [place] [address] Phone: [phone]  Continue to avoid nsaids like alleve and make sure to drink sufficient fluids.   Recheck in 6 months, let me know if there are questions.  If you have lab work done today you will be contacted with your lab results within the next 2 weeks.  If you have not heard from [place] then please contact us. The fastest way to get your results is to register for My Chart.   IF you received an x-ray today, you will receive an invoice from [facility]. Please contact [facility] at [phone] with questions or concerns regarding your invoice.   IF you received labwork today, you will receive an invoice from [facility]. Please contact [facility] at [phone] with questions or concerns regarding your invoice.   Our billing staff will not be able to assist you with questions regarding bills from these companies.  You will be contacted with the lab results as soon as they are available. The fastest way to get your results is to activate your My Chart account. Instructions are located on the last page of this paperwork. If you have not heard from [place] regarding the results in 2 weeks, please contact this office.

## 2021-02-09 LAB — COMPREHENSIVE METABOLIC PANEL
ALT: 10 IU/L (ref 0–32)
AST: 13 IU/L (ref 0–40)
Albumin/Globulin Ratio: 1.4 (ref 1.2–2.2)
Albumin: 4.1 g/dL (ref 3.8–4.8)
Alkaline Phosphatase: 237 IU/L — ABNORMAL HIGH (ref 44–121)
BUN/Creatinine Ratio: 15 (ref 12–28)
BUN: 28 mg/dL — ABNORMAL HIGH (ref 8–27)
Bilirubin Total: 0.3 mg/dL (ref 0.0–1.2)
CO2: 19 mmol/L — ABNORMAL LOW (ref 20–29)
Calcium: 9.6 mg/dL (ref 8.7–10.3)
Chloride: 111 mmol/L — ABNORMAL HIGH (ref 96–106)
Creatinine, Ser: 1.9 mg/dL — ABNORMAL HIGH (ref 0.57–1.00)
GFR calc Af Amer: 32 mL/min/{1.73_m2} — ABNORMAL LOW (ref >59)
GFR calc non Af Amer: 27 mL/min/{1.73_m2} — ABNORMAL LOW (ref >59)
Globulin, Total: 2.9 g/dL (ref 1.5–4.5)
Glucose: 95 mg/dL (ref 65–99)
Potassium: 4.7 mmol/L (ref 3.5–5.2)
Sodium: 146 mmol/L — ABNORMAL HIGH (ref 134–144)
Total Protein: 7 g/dL (ref 6.0–8.5)

## 2021-02-09 LAB — LIPID PANEL
Chol/HDL Ratio: 4.6 ratio — ABNORMAL HIGH (ref 0.0–4.4)
Cholesterol, Total: 190 mg/dL (ref 100–199)
HDL: 41 mg/dL (ref >39)
LDL Chol Calc (NIH): 128 mg/dL — ABNORMAL HIGH (ref 0–99)
Triglycerides: 117 mg/dL (ref 0–149)
VLDL Cholesterol Cal: 21 mg/dL (ref 5–40)

## 2021-02-20 NOTE — Progress Notes (Signed)
Lab Letter SENT  

## 2021-08-09 ENCOUNTER — Ambulatory Visit (INDEPENDENT_AMBULATORY_CARE_PROVIDER_SITE_OTHER): Payer: 59 | Admitting: Family Medicine

## 2021-08-09 ENCOUNTER — Other Ambulatory Visit: Payer: Self-pay

## 2021-08-09 VITALS — BP 128/76 | HR 70 | Temp 98.0°F | Resp 17 | Ht 63.0 in | Wt 231.8 lb

## 2021-08-09 DIAGNOSIS — I10 Essential (primary) hypertension: Secondary | ICD-10-CM

## 2021-08-09 DIAGNOSIS — N1832 Chronic kidney disease, stage 3b: Secondary | ICD-10-CM

## 2021-08-09 DIAGNOSIS — E785 Hyperlipidemia, unspecified: Secondary | ICD-10-CM | POA: Diagnosis not present

## 2021-08-09 DIAGNOSIS — Z7185 Encounter for immunization safety counseling: Secondary | ICD-10-CM

## 2021-08-09 DIAGNOSIS — Z6841 Body Mass Index (BMI) 40.0 and over, adult: Secondary | ICD-10-CM

## 2021-08-09 LAB — COMPREHENSIVE METABOLIC PANEL
ALT: 9 U/L (ref 0–35)
AST: 11 U/L (ref 0–37)
Albumin: 4 g/dL (ref 3.5–5.2)
Alkaline Phosphatase: 217 U/L — ABNORMAL HIGH (ref 39–117)
BUN: 35 mg/dL — ABNORMAL HIGH (ref 6–23)
CO2: 23 mEq/L (ref 19–32)
Calcium: 9.5 mg/dL (ref 8.4–10.5)
Chloride: 112 mEq/L (ref 96–112)
Creatinine, Ser: 1.98 mg/dL — ABNORMAL HIGH (ref 0.40–1.20)
GFR: 26.18 mL/min — ABNORMAL LOW (ref >60.00)
Glucose, Bld: 97 mg/dL (ref 70–99)
Potassium: 5.1 mEq/L (ref 3.5–5.1)
Sodium: 144 mEq/L (ref 135–145)
Total Bilirubin: 0.4 mg/dL (ref 0.2–1.2)
Total Protein: 7.3 g/dL (ref 6.0–8.3)

## 2021-08-09 LAB — LIPID PANEL
Cholesterol: 180 mg/dL (ref 0–200)
HDL: 47.2 mg/dL (ref >39.00)
LDL Cholesterol: 118 mg/dL — ABNORMAL HIGH (ref 0–99)
NonHDL: 132.9
Total CHOL/HDL Ratio: 4
Triglycerides: 76 mg/dL (ref 0.0–149.0)
VLDL: 15.2 mg/dL (ref 0.0–40.0)

## 2021-08-09 MED ORDER — ATORVASTATIN CALCIUM 10 MG PO TABS: 10.0000 mg | ORAL_TABLET | Freq: Every day | ORAL | 2 refills | Status: DC

## 2021-08-09 MED ORDER — AMLODIPINE BESYLATE 10 MG PO TABS: 10.0000 mg | ORAL_TABLET | Freq: Every day | ORAL | 2 refills | Status: DC

## 2021-08-09 MED ORDER — LISINOPRIL-HYDROCHLOROTHIAZIDE 20-12.5 MG PO TABS: 1.0000 | ORAL_TABLET | Freq: Every day | ORAL | 2 refills | Status: DC

## 2021-08-09 NOTE — Patient Instructions (Addendum)
Call your nephrologist for follow up appointment, but no med changes for now.  [phone], [employer], [place].   Great work on the walking and weight loss!   Shingles vaccine can be given at your pharmacy. Take care.

## 2021-08-09 NOTE — Progress Notes (Signed)
Subjective:  Patient ID: Patricia Wu, female    DOB: [birth year]  Age: 65 y.o. MRN: [id number]  CC:  Chief Complaint  Patient presents with   Hyperlipidemia    6 month recheck,  no concerns    Hypertension    Pt here for recheck, denies physical sxs   Immunizations    Pt is considering starting her shingles vaccine series today.     HPI Patricia Wu presents for   Hypertension: With chronic kidney disease.  Creatinine range 1.86-2.38 since 2019, followed by nephrology. Lisinopril HCTZ 20/12.5 mg Daily - taking in am, amlodipine 10 mg daily. Home readings:130/77 No new side effects with meds.  Last nephrology appt 11/2020 - Dr. Marval Regal.  1 hour walk every other day for exercise. Lost 10#.  Wt Readings from Last 3 Encounters:  08/09/21 231 lb 12.8 oz (105.1 kg)  02/08/21 241 lb (109.3 kg)  10/17/20 223 lb (101.2 kg)  Body mass index is 41.06 kg/m.   BP Readings from Last 3 Encounters:  08/09/21 128/76  02/08/21 136/82  10/17/20 123/65   Lab Results  Component Value Date   CREATININE 1.90 (H) 02/08/2021   Hyperlipidemia: Lipitor 10 mg daily. No new myalgias or side effects.  Lab Results  Component Value Date   CHOL 190 02/08/2021   HDL 41 02/08/2021   LDLCALC 128 (H) 02/08/2021   TRIG 117 02/08/2021   CHOLHDL 4.6 (H) 02/08/2021   Lab Results  Component Value Date   ALT 10 02/08/2021   AST 13 02/08/2021   GGT 247 (H) 10/11/2020   ALKPHOS 237 (H) 02/08/2021   BILITOT 0.3 02/08/2021   HM: Plans to have shingrix at her pharmacy.    History Patient Active Problem List   Diagnosis Date Noted   Colon cancer screening 09/21/2020   Elevated alkaline phosphatase level 09/21/2020   Essential hypertension 02/18/2018   Elevated brain natriuretic peptide (BNP) level 02/18/2018   CKD (chronic kidney disease) stage 3, GFR 30-59 ml/min (HCC) 02/18/2018   Past Medical History:  Diagnosis Date   CKD (chronic kidney disease)    Hypercholesteremia     Hypertension    Noncompliance with medication regimen    Past Surgical History:  Procedure Laterality Date   COLONOSCOPY WITH PROPOFOL N/A 10/17/2020   Procedure: COLONOSCOPY WITH PROPOFOL;  Surgeon: Eloise Harman, DO;  [facility];  Service: Endoscopy;  Laterality: N/A;  1:00pm   POLYPECTOMY  10/17/2020   Procedure: POLYPECTOMY;  Surgeon: Eloise Harman, DO;  [facility];  Service: Endoscopy;;   Allergies  Allergen Reactions   Ibuprofen Other (See Comments)    pcp said raises her BP   Prior to Admission medications   Medication Sig Start Date End Date Taking? Authorizing Provider  acetaminophen (TYLENOL) 325 MG tablet Take 325-650 mg by mouth every 6 (six) hours as needed (pain.).    Yes [provider]  amLODipine (NORVASC) 10 MG tablet Take 1 tablet (10 mg total) by mouth daily. 02/08/21  Yes Wendie Agreste, MD  atorvastatin (LIPITOR) 10 MG tablet Take 1 tablet (10 mg total) by mouth daily. 02/08/21  Yes Wendie Agreste, MD  lisinopril-hydrochlorothiazide (ZESTORETIC) 20-12.5 MG tablet Take 1 tablet by mouth daily. 02/08/21  Yes Wendie Agreste, MD   Social History   Socioeconomic History   Marital status: Married    Spouse name: Not on file   Number of children: 2   Years of education: Not on  file   Highest education level: Not on file  Occupational History   Not on file  Tobacco Use   Smoking status: Never   Smokeless tobacco: Never  Substance and Sexual Activity   Alcohol use: No   Drug use: No   Sexual activity: Yes  Other Topics Concern   Not on file  Social History Narrative   Not on file   Social Determinants of Health   Financial Resource Strain: Not on file  Food Insecurity: Not on file  Transportation Needs: Not on file  Physical Activity: Not on file  Stress: Not on file  Social Connections: Not on file  Intimate Partner Violence: Not on file    Review of Systems  Constitutional:  Negative for fatigue  and unexpected weight change.  Respiratory:  Negative for chest tightness and shortness of breath.   Cardiovascular:  Negative for chest pain, palpitations and leg swelling.  Gastrointestinal:  Negative for abdominal pain and blood in stool.  Neurological:  Negative for dizziness, syncope, light-headedness and headaches.    Objective:   Vitals:   08/09/21 0814  BP: 128/76  Pulse: 70  Resp: 17  Temp: 98 F (36.7 C)  TempSrc: Temporal  SpO2: 97%  Weight: 231 lb 12.8 oz (105.1 kg)  Height: 5\' 3"  (1.6 m)     Physical Exam Vitals reviewed.  Constitutional:      Appearance: Normal appearance. She is well-developed.  HENT:     Head: Normocephalic and atraumatic.  Eyes:     Conjunctiva/sclera: Conjunctivae normal.     Pupils: Pupils are equal, round, and reactive to light.  Neck:     Vascular: No carotid bruit.  Cardiovascular:     Rate and Rhythm: Normal rate and regular rhythm.     Heart sounds: Normal heart sounds.  Pulmonary:     Effort: Pulmonary effort is normal.     Breath sounds: Normal breath sounds.  Abdominal:     Palpations: Abdomen is soft. There is no pulsatile mass.     Tenderness: There is no abdominal tenderness.  Musculoskeletal:     Right lower leg: No edema.     Left lower leg: No edema.  Skin:    General: Skin is warm and dry.  Neurological:     Mental Status: She is alert and oriented to person, place, and time.  Psychiatric:        Mood and Affect: Mood normal.        Behavior: Behavior normal.       Assessment & Plan:  Patricia Wu is a 65 y.o. female . Essential hypertension - Plan: amLODipine (NORVASC) 10 MG tablet, lisinopril-hydrochlorothiazide (ZESTORETIC) 20-12.5 MG tablet, Comprehensive metabolic panel  -  Stable, tolerating current regimen. Medications refilled. Labs pending as above.   Hyperlipidemia, unspecified hyperlipidemia type - Plan: atorvastatin (LIPITOR) 10 MG tablet, Comprehensive metabolic panel, Lipid panel  -  check labs. Continue lipitor as tolerating.   Stage 3b chronic kidney disease ([place])  - check renal function, continue to closely monitor BP, follow up with nephrology. Number provided.   Class 3 severe obesity with body mass index (BMI) of 40.0 to 44.9 in adult, unspecified obesity type, unspecified whether serious comorbidity present ([place])  - commended on weight loss and exercise. Continue same.   Immunization counseling - discussed shingrix including benefits, timing of repeat injection and possible side effects. She plans to have given at her pharmacy.   Meds ordered this encounter  Medications  amLODipine (NORVASC) 10 MG tablet    Sig: Take 1 tablet (10 mg total) by mouth daily.    Dispense:  90 tablet    Refill:  2   atorvastatin (LIPITOR) 10 MG tablet    Sig: Take 1 tablet (10 mg total) by mouth daily.    Dispense:  90 tablet    Refill:  2   lisinopril-hydrochlorothiazide (ZESTORETIC) 20-12.5 MG tablet    Sig: Take 1 tablet by mouth daily.    Dispense:  90 tablet    Refill:  2   Patient Instructions  Call your nephrologist for follow up appointment, but no med changes for now.  [phone], [employer], [place].   Great work on the walking and weight loss!   Shingles vaccine can be given at your pharmacy. Take care.     Signed,   Merri Ray, MD [facility] 08/09/21 8:54 AM

## 2022-02-14 ENCOUNTER — Encounter (HOSPITAL_COMMUNITY): Payer: Self-pay

## 2022-02-14 ENCOUNTER — Other Ambulatory Visit: Payer: Self-pay

## 2022-02-14 ENCOUNTER — Emergency Department (HOSPITAL_COMMUNITY): Payer: No Typology Code available for payment source

## 2022-02-14 ENCOUNTER — Inpatient Hospital Stay (HOSPITAL_COMMUNITY)
Admission: EM | Admit: 2022-02-14 | Discharge: 2022-02-16 | DRG: 683 | Disposition: A | Discharge status: Home / Self Care | Payer: No Typology Code available for payment source | Attending: Internal Medicine | Admitting: Internal Medicine

## 2022-02-14 ENCOUNTER — Ambulatory Visit (INDEPENDENT_AMBULATORY_CARE_PROVIDER_SITE_OTHER): Payer: No Typology Code available for payment source | Admitting: Family Medicine

## 2022-02-14 VITALS — BP 122/70 | HR 71 | Temp 97.8°F | Resp 16 | Ht 63.0 in | Wt 211.6 lb

## 2022-02-14 DIAGNOSIS — D649 Anemia, unspecified: Secondary | ICD-10-CM | POA: Diagnosis not present

## 2022-02-14 DIAGNOSIS — Z23 Encounter for immunization: Secondary | ICD-10-CM

## 2022-02-14 DIAGNOSIS — N1832 Chronic kidney disease, stage 3b: Secondary | ICD-10-CM | POA: Diagnosis not present

## 2022-02-14 DIAGNOSIS — E785 Hyperlipidemia, unspecified: Secondary | ICD-10-CM

## 2022-02-14 DIAGNOSIS — N179 Acute kidney failure, unspecified: Secondary | ICD-10-CM | POA: Diagnosis not present

## 2022-02-14 DIAGNOSIS — E7800 Pure hypercholesterolemia, unspecified: Secondary | ICD-10-CM | POA: Diagnosis present

## 2022-02-14 DIAGNOSIS — I129 Hypertensive chronic kidney disease with stage 1 through stage 4 chronic kidney disease, or unspecified chronic kidney disease: Secondary | ICD-10-CM | POA: Diagnosis present

## 2022-02-14 DIAGNOSIS — N189 Chronic kidney disease, unspecified: Secondary | ICD-10-CM | POA: Diagnosis not present

## 2022-02-14 DIAGNOSIS — R7989 Other specified abnormal findings of blood chemistry: Secondary | ICD-10-CM

## 2022-02-14 DIAGNOSIS — E872 Acidosis, unspecified: Secondary | ICD-10-CM | POA: Diagnosis present

## 2022-02-14 DIAGNOSIS — N3289 Other specified disorders of bladder: Secondary | ICD-10-CM | POA: Diagnosis not present

## 2022-02-14 DIAGNOSIS — I10 Essential (primary) hypertension: Secondary | ICD-10-CM | POA: Diagnosis present

## 2022-02-14 DIAGNOSIS — K8020 Calculus of gallbladder without cholecystitis without obstruction: Secondary | ICD-10-CM | POA: Diagnosis not present

## 2022-02-14 DIAGNOSIS — Z79899 Other long term (current) drug therapy: Secondary | ICD-10-CM

## 2022-02-14 DIAGNOSIS — R748 Abnormal levels of other serum enzymes: Secondary | ICD-10-CM | POA: Diagnosis present

## 2022-02-14 DIAGNOSIS — Z888 Allergy status to other drugs, medicaments and biological substances status: Secondary | ICD-10-CM

## 2022-02-14 DIAGNOSIS — K828 Other specified diseases of gallbladder: Secondary | ICD-10-CM | POA: Diagnosis not present

## 2022-02-14 DIAGNOSIS — D259 Leiomyoma of uterus, unspecified: Secondary | ICD-10-CM | POA: Diagnosis not present

## 2022-02-14 DIAGNOSIS — Z6837 Body mass index (BMI) 37.0-37.9, adult: Secondary | ICD-10-CM

## 2022-02-14 DIAGNOSIS — Z20822 Contact with and (suspected) exposure to covid-19: Secondary | ICD-10-CM | POA: Diagnosis present

## 2022-02-14 DIAGNOSIS — Z9114 Patient's other noncompliance with medication regimen: Secondary | ICD-10-CM

## 2022-02-14 DIAGNOSIS — E669 Obesity, unspecified: Secondary | ICD-10-CM

## 2022-02-14 LAB — COMPREHENSIVE METABOLIC PANEL
ALT: 9 U/L (ref 0–35)
ALT: 12 U/L (ref 0–44)
AST: 10 U/L (ref 0–37)
AST: 14 U/L — ABNORMAL LOW (ref 15–41)
Albumin: 4.2 g/dL (ref 3.5–5.2)
Albumin: 3.7 g/dL (ref 3.5–5.0)
Alkaline Phosphatase: 250 U/L — ABNORMAL HIGH (ref 39–117)
Alkaline Phosphatase: 223 U/L — ABNORMAL HIGH (ref 38–126)
Anion gap: 11 (ref 5–15)
BUN: 63 mg/dL — ABNORMAL HIGH (ref 6–23)
BUN: 68 mg/dL — ABNORMAL HIGH (ref 8–23)
CO2: 24 mEq/L (ref 19–32)
CO2: 18 mmol/L — ABNORMAL LOW (ref 22–32)
Calcium: 9.6 mg/dL (ref 8.4–10.5)
Calcium: 8.7 mg/dL — ABNORMAL LOW (ref 8.9–10.3)
Chloride: 110 mEq/L (ref 96–112)
Chloride: 111 mmol/L (ref 98–111)
Creatinine, Ser: 3.83 mg/dL — ABNORMAL HIGH (ref 0.40–1.20)
Creatinine, Ser: 4.12 mg/dL — ABNORMAL HIGH (ref 0.44–1.00)
GFR, Estimated: 11 mL/min — ABNORMAL LOW (ref >60)
GFR: 11.82 mL/min — CL (ref >60.00)
Glucose, Bld: 95 mg/dL (ref 70–99)
Glucose, Bld: 101 mg/dL — ABNORMAL HIGH (ref 70–99)
Potassium: 4.7 mEq/L (ref 3.5–5.1)
Potassium: 4.2 mmol/L (ref 3.5–5.1)
Sodium: 142 mEq/L (ref 135–145)
Sodium: 140 mmol/L (ref 135–145)
Total Bilirubin: 0.5 mg/dL (ref 0.2–1.2)
Total Bilirubin: 0.6 mg/dL (ref 0.3–1.2)
Total Protein: 7.5 g/dL (ref 6.0–8.3)
Total Protein: 7.9 g/dL (ref 6.5–8.1)

## 2022-02-14 LAB — CBC
HCT: 33.9 % — ABNORMAL LOW (ref 36.0–46.0)
Hemoglobin: 11.1 g/dL — ABNORMAL LOW (ref 12.0–15.0)
MCHC: 32.6 g/dL (ref 30.0–36.0)
MCV: 85.6 fl (ref 78.0–100.0)
Platelets: 287 10*3/uL (ref 150.0–400.0)
RBC: 3.96 Mil/uL (ref 3.87–5.11)
RDW: 14.6 % (ref 11.5–15.5)
WBC: 9.6 10*3/uL (ref 4.0–10.5)

## 2022-02-14 LAB — LIPID PANEL
Cholesterol: 201 mg/dL — ABNORMAL HIGH (ref 0–200)
HDL: 50.4 mg/dL (ref >39.00)
LDL Cholesterol: 134 mg/dL — ABNORMAL HIGH (ref 0–99)
NonHDL: 150.56
Total CHOL/HDL Ratio: 4
Triglycerides: 84 mg/dL (ref 0.0–149.0)
VLDL: 16.8 mg/dL (ref 0.0–40.0)

## 2022-02-14 MED ORDER — LISINOPRIL-HYDROCHLOROTHIAZIDE 20-12.5 MG PO TABS: 1.0000 | ORAL_TABLET | Freq: Every day | ORAL | 2 refills | Status: DC

## 2022-02-14 MED ORDER — ATORVASTATIN CALCIUM 10 MG PO TABS: 10.0000 mg | ORAL_TABLET | Freq: Every day | ORAL | 2 refills | Status: DC

## 2022-02-14 MED ORDER — LACTATED RINGERS IV BOLUS
1000.0000 mL | Freq: Once | INTRAVENOUS | Status: AC
  Administered 2022-02-14: 1000 mL via INTRAVENOUS

## 2022-02-14 MED ORDER — AMLODIPINE BESYLATE 10 MG PO TABS: 10.0000 mg | ORAL_TABLET | Freq: Every day | ORAL | 2 refills | Status: DC

## 2022-02-14 NOTE — Patient Instructions (Addendum)
You are overdue for follow up with nephrology - call [facility] for appointment.   No med changes today. Return to the clinic or go to the nearest emergency room if any of your symptoms worsen or new symptoms occur.   Recheck in 20months. Take care.   Managing Your Hypertension Hypertension, also called high blood pressure, is when the force of the blood pressing against the walls of the arteries is too strong. Arteries are blood vessels that carry blood from your heart throughout your body. Hypertension forces the heart to work harder to pump blood and may cause the arteries to become narrow or stiff. Understanding blood pressure readings Your personal target blood pressure may vary depending on your medical conditions, your age, and other factors. A blood pressure reading includes a higher number over a lower number. Ideally, your blood pressure should be below 120/80. You should know that: The first, or top, number is called the systolic pressure. It is a measure of the pressure in your arteries as your heart beats. The second, or bottom number, is called the diastolic pressure. It is a measure of the pressure in your arteries as the heart relaxes. Blood pressure is classified into four stages. Based on your blood pressure reading, your health care provider may use the following stages to determine what type of treatment you need, if any. Systolic pressure and diastolic pressure are measured in a unit called mmHg. Normal Systolic pressure: below 505. Diastolic pressure: below 80. Elevated Systolic pressure: 397-673. Diastolic pressure: below 80. Hypertension stage 1 Systolic pressure: 419-379. Diastolic pressure: 02-40. Hypertension stage 2 Systolic pressure: 973 or above. Diastolic pressure: 90 or above. How can this condition affect me? Managing your hypertension is an important responsibility. Over time, hypertension can damage the arteries and decrease blood flow to  important parts of the body, including the brain, heart, and kidneys. Having untreated or uncontrolled hypertension can lead to: A heart attack. A stroke. A weakened blood vessel (aneurysm). Heart failure. Kidney damage. Eye damage. Metabolic syndrome. Memory and concentration problems. Vascular dementia. What actions can I take to manage this condition? Hypertension can be managed by making lifestyle changes and possibly by taking medicines. Your health care provider will help you make a plan to bring your blood pressure within a normal range. Nutrition  Eat a diet that is high in fiber and potassium, and low in salt (sodium), added sugar, and fat. An example eating plan is called the Dietary Approaches to Stop Hypertension (DASH) diet. To eat this way: Eat plenty of fresh fruits and vegetables. Try to fill one-half of your plate at each meal with fruits and vegetables. Eat whole grains, such as whole-wheat pasta, brown rice, or whole-grain bread. Fill about one-fourth of your plate with whole grains. Eat low-fat dairy products. Avoid fatty cuts of meat, processed or cured meats, and poultry with skin. Fill about one-fourth of your plate with lean proteins such as fish, chicken without skin, beans, eggs, and tofu. Avoid pre-made and processed foods. These tend to be higher in sodium, added sugar, and fat. Reduce your daily sodium intake. Most people with hypertension should eat less than 1,500 mg of sodium a day. Lifestyle  Work with your health care provider to maintain a healthy body weight or to lose weight. Ask what an ideal weight is for you. Get at least 30 minutes of exercise that causes your heart to beat faster (aerobic exercise) most days of the week. Activities may include walking, swimming,  or biking. Include exercise to strengthen your muscles (resistance exercise), such as weight lifting, as part of your weekly exercise routine. Try to do these types of exercises for 30  minutes at least 3 days a week. Do not use any products that contain nicotine or tobacco, such as cigarettes, e-cigarettes, and chewing tobacco. If you need help quitting, ask your health care provider. Control any long-term (chronic) conditions you have, such as high cholesterol or diabetes. Identify your sources of stress and find ways to manage stress. This may include meditation, deep breathing, or making time for fun activities. Alcohol use Do not drink alcohol if: Your health care provider tells you not to drink. You are pregnant, may be pregnant, or are planning to become pregnant. If you drink alcohol: Limit how much you use to: 0-1 drink a day for women. 0-2 drinks a day for men. Be aware of how much alcohol is in your drink. In the [place] one drink equals one 12 oz bottle of beer (355 mL), one 5 oz glass of wine (148 mL), or one 1 oz glass of hard liquor (44 mL). Medicines Your health care provider may prescribe medicine if lifestyle changes are not enough to get your blood pressure under control and if: Your systolic blood pressure is 130 or higher. Your diastolic blood pressure is 80 or higher. Take medicines only as told by your health care provider. Follow the directions carefully. Blood pressure medicines must be taken as told by your health care provider. The medicine does not work as well when you skip doses. Skipping doses also puts you at risk for problems. Monitoring Before you monitor your blood pressure: Do not smoke, drink caffeinated beverages, or exercise within 30 minutes before taking a measurement. Use the bathroom and empty your bladder (urinate). Sit quietly for at least 5 minutes before taking measurements. Monitor your blood pressure at home as told by your health care provider. To do this: Sit with your back straight and supported. Place your feet flat on the floor. Do not cross your legs. Support your arm on a flat surface, such as a table. Make sure your  upper arm is at heart level. Each time you measure, take two or three readings one minute apart and record the results. You may also need to have your blood pressure checked regularly by your health care provider. General information Talk with your health care provider about your diet, exercise habits, and other lifestyle factors that may be contributing to hypertension. Review all the medicines you take with your health care provider because there may be side effects or interactions. Keep all visits as told by your health care provider. Your health care provider can help you create and adjust your plan for managing your high blood pressure. Where to find more information National Heart, Lung, and Blood Institute: https://wilson-eaton.com/ American Heart Association: www.heart.org Contact a health care provider if: You think you are having a reaction to medicines you have taken. You have repeated (recurrent) headaches. You feel dizzy. You have swelling in your ankles. You have trouble with your vision. Get help right away if: You develop a severe headache or confusion. You have unusual weakness or numbness, or you feel faint. You have severe pain in your chest or abdomen. You vomit repeatedly. You have trouble breathing. These symptoms may represent a serious problem that is an emergency. Do not wait to see if the symptoms will go away. Get medical help right away. Call your local emergency  services (911 in the [place] Do not drive yourself to the hospital. Summary Hypertension is when the force of blood pumping through your arteries is too strong. If this condition is not controlled, it may put you at risk for serious complications. Your personal target blood pressure may vary depending on your medical conditions, your age, and other factors. For most people, a normal blood pressure is less than 120/80. Hypertension is managed by lifestyle changes, medicines, or both. Lifestyle changes to help  manage hypertension include losing weight, eating a healthy, low-sodium diet, exercising more, stopping smoking, and limiting alcohol. This information is not intended to replace advice given to you by your health care provider. Make sure you discuss any questions you have with your health care provider. Document Revised: 12/28/2019 Document Reviewed: 11/10/2019 Elsevier Patient Education  2022 [employer].

## 2022-02-14 NOTE — Progress Notes (Signed)
Subjective:  Patient ID: Patricia Wu, female    DOB: 09/09/1956  Age: 66 y.o. MRN: [id number]  CC:  Chief Complaint  Patient presents with   Hypertension    Pt here for recheck, still has 3 months worth but will be doing physical next visit no concerns    Hyperlipidemia    Due for recheck, no concerns    Fatigue    Pt would like recommendation on vitamins to take as she reports has had some tiredness over last few months but notes this is manageable  thought vitamins may help    HPI Patricia Wu presents for   Anemia: No recent labs.  Denies fatigue or feeling tired on my history.  Took elderberry in past. Part time at [employer]. Sometimes tired at end of shift.  Sleeping ok. Wakes up once.  No chest pain, palpitations. No dark stools, dizziness.  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 No nsaids.  taking amlodipine 10mg  and lisinopril hctz 20/12.5mg  Qd, no new side effect.  Last nephrology eval in 2021 - prior stage 3b. Dr. Marval Regal. 4 month recheck recommended.  Home readings: 140/70.  BP Readings from Last 3 Encounters:  02/14/22 122/70  08/09/21 128/76  02/08/21 136/82   Lab Results  Component Value Date   CREATININE 3.83 (H) 02/14/2022    Hyperlipidemia: Lipitor 10mg  qd, no new myaligas/side effects.  Lab Results  Component Value Date   CHOL 201 (H) 02/14/2022   HDL 50.40 02/14/2022   LDLCALC 134 (H) 02/14/2022   TRIG 84.0 02/14/2022   CHOLHDL 4 02/14/2022   Lab Results  Component Value Date   ALT 9 02/14/2022   AST 10 02/14/2022   GGT 247 (H) 10/11/2020   ALKPHOS 250 (H) 02/14/2022   BILITOT 0.5 02/14/2022    HM: Pneumonia vaccine today.  Plans on 2nd shingles vaccine and  covid booster at pharmacy.  Declines BMD at this time.   Immunization History  Administered Date(s) Administered   Influenza,inj,Quad PF,6+ Mos 02/01/2018   PNEUMOCOCCAL CONJUGATE-20 02/14/2022   Tdap 02/01/2018   Zoster Recombinat (Shingrix) 10/26/2021   Zoster, Live 01/07/2017    History Patient Active Problem List   Diagnosis Date Noted   Colon cancer screening 09/21/2020   Elevated alkaline phosphatase level 09/21/2020   Essential hypertension 02/18/2018   Elevated brain natriuretic peptide (BNP) level 02/18/2018   CKD (chronic kidney disease) stage 3, GFR 30-59 ml/min (HCC) 02/18/2018   Past Medical History:  Diagnosis Date   CKD (chronic kidney disease)    Hypercholesteremia    Hypertension    Noncompliance with medication regimen    Past Surgical History:  Procedure Laterality Date   COLONOSCOPY WITH PROPOFOL N/A 10/17/2020   Procedure: COLONOSCOPY WITH PROPOFOL;  Surgeon: Eloise Harman, DO;  [facility];  Service: Endoscopy;  Laterality: N/A;  1:00pm   POLYPECTOMY  10/17/2020   Procedure: POLYPECTOMY;  Surgeon: Eloise Harman, DO;  Location: [facility];  Service: Endoscopy;;   Allergies  Allergen Reactions   Ibuprofen Other (See Comments)    pcp said raises her BP   Prior to Admission medications   Medication Sig Start Date End  Date Taking? Authorizing Provider  acetaminophen (TYLENOL) 325 MG tablet Take 325-650 mg by mouth every 6 (six) hours as needed (pain.).    Yes [provider]  amLODipine (NORVASC) 10 MG tablet Take 1 tablet (10 mg total) by mouth daily. 08/09/21  Yes Wendie Agreste, MD  atorvastatin (LIPITOR) 10 MG tablet Take 1 tablet (10 mg total) by mouth daily. 08/09/21  Yes Wendie Agreste, MD  lisinopril-hydrochlorothiazide (ZESTORETIC) 20-12.5 MG tablet Take 1 tablet by mouth daily. 08/09/21  Yes Wendie Agreste, MD   Social History   Socioeconomic History   Marital status: Married    Spouse name: Not on  file   Number of children: 2   Years of education: Not on file   Highest education level: Not on file  Occupational History   Not on file  Tobacco Use   Smoking status: Never   Smokeless tobacco: Never  Substance and Sexual Activity   Alcohol use: No   Drug use: No   Sexual activity: Yes  Other Topics Concern   Not on file  Social History Narrative   Not on file   Social Determinants of Health   Financial Resource Strain: Not on file  Food Insecurity: Not on file  Transportation Needs: Not on file  Physical Activity: Not on file  Stress: Not on file  Social Connections: Not on file  Intimate Partner Violence: Not on file    Review of Systems  Constitutional:  Negative for fatigue and unexpected weight change.  Respiratory:  Negative for chest tightness and shortness of breath.   Cardiovascular:  Negative for chest pain, palpitations and leg swelling.  Gastrointestinal:  Negative for abdominal pain and blood in stool.  Neurological:  Negative for dizziness, syncope, light-headedness and headaches.    Objective:   Vitals:   02/14/22 0836  BP: 122/70  Pulse: 71  Resp: 16  Temp: 97.8 F (36.6 C)  TempSrc: Temporal  SpO2: 98%  Weight: 211 lb 9.6 oz (96 kg)  Height: 5\' 3"  (1.6 m)     Physical Exam Vitals reviewed.  Constitutional:      Appearance: Normal appearance. She is well-developed.  HENT:     Head: Normocephalic and atraumatic.  Eyes:     Conjunctiva/sclera: Conjunctivae normal.     Pupils: Pupils are equal, round, and reactive to light.  Neck:     Vascular: No carotid bruit.  Cardiovascular:     Rate and Rhythm: Normal rate and regular rhythm.     Heart sounds: Normal heart sounds.  Pulmonary:     Effort: Pulmonary effort is normal.     Breath sounds: Normal breath sounds.  Abdominal:     Palpations: Abdomen is soft. There is no pulsatile mass.     Tenderness: There is no abdominal tenderness.  Musculoskeletal:     Right lower leg: No edema.      Left lower leg: No edema.  Skin:    General: Skin is warm and dry.  Neurological:     Mental Status: She is alert and oriented to person, place, and time.  Psychiatric:        Mood and Affect: Mood normal.        Behavior: Behavior normal.       Assessment & Plan:  Patricia Wu is a 66 y.o. female . Essential hypertension - Plan: amLODipine (NORVASC) 10 MG tablet, lisinopril-hydrochlorothiazide (ZESTORETIC) 20-12.5 MG tablet, Comprehensive metabolic panel  Hyperlipidemia, unspecified hyperlipidemia type - Plan: atorvastatin (  LIPITOR) 10 MG tablet, Lipid panel  Anemia, unspecified type - Plan: CBC  Need for pneumococcal vaccination - Plan: Pneumococcal conjugate vaccine 20-valent (Prevnar 20)  Stage 3b chronic kidney disease ([place]) - Plan: Comprehensive metabolic panel  Elevated serum creatinine  Pneumonia vaccination given.  Blood pressure stable in office, refilled meds, labs obtained.  Tolerating statin, continued same.  4:37 PM  Critical Labs received at 1635.  Significant increase in creatinine, suspect acute renal failure on chronic kidney disease.  May be explanation for some of the fatigue at end of day.  Called patient and advised ER evaluation tonight. Plans to be seen at [facility].  All questions were answered.   Also noted slight decrease in hemoglobin, possible anemia of chronic kidney disease.  Further eval through ER as well  Meds ordered this encounter  Medications   amLODipine (NORVASC) 10 MG tablet    Sig: Take 1 tablet (10 mg total) by mouth daily.    Dispense:  90 tablet    Refill:  2   atorvastatin (LIPITOR) 10 MG tablet    Sig: Take 1 tablet (10 mg total) by mouth daily.    Dispense:  90 tablet    Refill:  2   lisinopril-hydrochlorothiazide (ZESTORETIC) 20-12.5 MG tablet    Sig: Take 1 tablet by mouth daily.    Dispense:  90 tablet    Refill:  2   Patient Instructions  You are overdue for follow up with nephrology - call [facility] for appointment.   No med changes today. Return to the clinic or go to the nearest emergency room if any of your symptoms worsen or new symptoms occur.   Recheck in 64months. Take care.   Managing Your Hypertension Hypertension, also called high blood pressure, is when the force of the blood pressing against the walls of the arteries is too strong. Arteries are blood vessels that carry blood from your heart throughout your body. Hypertension forces the heart to work harder to pump blood and may cause the arteries to become narrow or stiff. Understanding blood pressure readings Your personal target blood pressure may vary depending on your medical conditions, your age, and other factors. A blood pressure reading includes a higher number over a lower number. Ideally, your blood pressure should be below 120/80. You should know that: The first, or top, number is called the systolic pressure. It is a measure of the pressure in your arteries as your heart beats. The second, or bottom number, is called the diastolic pressure. It is a measure of the pressure in your arteries as the heart relaxes. Blood pressure is classified into four stages. Based on your blood pressure reading, your health care provider may use the following stages to determine what type of treatment you need, if any. Systolic pressure and diastolic pressure are measured in a unit called mmHg. Normal Systolic pressure: below 948. Diastolic pressure: below 80. Elevated Systolic pressure: 016-553. Diastolic pressure: below 80. Hypertension stage 1 Systolic pressure: 748-270. Diastolic pressure: 78-67. Hypertension stage 2 Systolic pressure: 544 or above. Diastolic pressure: 90 or above. How can this condition affect me? Managing your hypertension is an important responsibility. Over time, hypertension can damage the arteries and decrease blood flow to important parts of the body, including the brain, heart, and  kidneys. Having untreated or uncontrolled hypertension can lead to: A heart attack. A stroke. A weakened blood vessel (aneurysm). Heart failure. Kidney damage. Eye damage. Metabolic syndrome. Memory and concentration problems.  Vascular dementia. What actions can I take to manage this condition? Hypertension can be managed by making lifestyle changes and possibly by taking medicines. Your health care provider will help you make a plan to bring your blood pressure within a normal range. Nutrition  Eat a diet that is high in fiber and potassium, and low in salt (sodium), added sugar, and fat. An example eating plan is called the Dietary Approaches to Stop Hypertension (DASH) diet. To eat this way: Eat plenty of fresh fruits and vegetables. Try to fill one-half of your plate at each meal with fruits and vegetables. Eat whole grains, such as whole-wheat pasta, brown rice, or whole-grain bread. Fill about one-fourth of your plate with whole grains. Eat low-fat dairy products. Avoid fatty cuts of meat, processed or cured meats, and poultry with skin. Fill about one-fourth of your plate with lean proteins such as fish, chicken without skin, beans, eggs, and tofu. Avoid pre-made and processed foods. These tend to be higher in sodium, added sugar, and fat. Reduce your daily sodium intake. Most people with hypertension should eat less than 1,500 mg of sodium a day. Lifestyle  Work with your health care provider to maintain a healthy body weight or to lose weight. Ask what an ideal weight is for you. Get at least 30 minutes of exercise that causes your heart to beat faster (aerobic exercise) most days of the week. Activities may include walking, swimming, or biking. Include exercise to strengthen your muscles (resistance exercise), such as weight lifting, as part of your weekly exercise routine. Try to do these types of exercises for 30 minutes at least 3 days a week. Do not use any products that  contain nicotine or tobacco, such as cigarettes, e-cigarettes, and chewing tobacco. If you need help quitting, ask your health care provider. Control any long-term (chronic) conditions you have, such as high cholesterol or diabetes. Identify your sources of stress and find ways to manage stress. This may include meditation, deep breathing, or making time for fun activities. Alcohol use Do not drink alcohol if: Your health care provider tells you not to drink. You are pregnant, may be pregnant, or are planning to become pregnant. If you drink alcohol: Limit how much you use to: 0-1 drink a day for women. 0-2 drinks a day for men. Be aware of how much alcohol is in your drink. In the [place] one drink equals one 12 oz bottle of beer (355 mL), one 5 oz glass of wine (148 mL), or one 1 oz glass of hard liquor (44 mL). Medicines Your health care provider may prescribe medicine if lifestyle changes are not enough to get your blood pressure under control and if: Your systolic blood pressure is 130 or higher. Your diastolic blood pressure is 80 or higher. Take medicines only as told by your health care provider. Follow the directions carefully. Blood pressure medicines must be taken as told by your health care provider. The medicine does not work as well when you skip doses. Skipping doses also puts you at risk for problems. Monitoring Before you monitor your blood pressure: Do not smoke, drink caffeinated beverages, or exercise within 30 minutes before taking a measurement. Use the bathroom and empty your bladder (urinate). Sit quietly for at least 5 minutes before taking measurements. Monitor your blood pressure at home as told by your health care provider. To do this: Sit with your back straight and supported. Place your feet flat on the floor. Do not cross your  legs. Support your arm on a flat surface, such as a table. Make sure your upper arm is at heart level. Each time you measure, take two  or three readings one minute apart and record the results. You may also need to have your blood pressure checked regularly by your health care provider. General information Talk with your health care provider about your diet, exercise habits, and other lifestyle factors that may be contributing to hypertension. Review all the medicines you take with your health care provider because there may be side effects or interactions. Keep all visits as told by your health care provider. Your health care provider can help you create and adjust your plan for managing your high blood pressure. Where to find more information National Heart, Lung, and Blood Institute: https://wilson-eaton.com/ American Heart Association: www.heart.org Contact a health care provider if: You think you are having a reaction to medicines you have taken. You have repeated (recurrent) headaches. You feel dizzy. You have swelling in your ankles. You have trouble with your vision. Get help right away if: You develop a severe headache or confusion. You have unusual weakness or numbness, or you feel faint. You have severe pain in your chest or abdomen. You vomit repeatedly. You have trouble breathing. These symptoms may represent a serious problem that is an emergency. Do not wait to see if the symptoms will go away. Get medical help right away. Call your local emergency services (911 in the [place] Do not drive yourself to the hospital. Summary Hypertension is when the force of blood pumping through your arteries is too strong. If this condition is not controlled, it may put you at risk for serious complications. Your personal target blood pressure may vary depending on your medical conditions, your age, and other factors. For most people, a normal blood pressure is less than 120/80. Hypertension is managed by lifestyle changes, medicines, or both. Lifestyle changes to help manage hypertension include losing weight, eating a healthy,  low-sodium diet, exercising more, stopping smoking, and limiting alcohol. This information is not intended to replace advice given to you by your health care provider. Make sure you discuss any questions you have with your health care provider. Document Revised: 12/28/2019 Document Reviewed: 11/10/2019 Elsevier Patient Education  2022 [place],   Merri Ray, MD [facility] 02/14/22 4:35 PM

## 2022-02-14 NOTE — ED Triage Notes (Signed)
Pt presents to ED, sent by Dr Carlota Raspberry for elevated creatinine level of 3.83.

## 2022-02-14 NOTE — ED Provider Notes (Signed)
[facility] EMERGENCY DEPARTMENT Provider Note   CSN: [id number] Arrival date & time: 02/14/22  1725     History  Chief Complaint  Patient presents with   Abnormal Labs    Patricia Wu is a 66 y.o. female.  66 year old female without any significant complaints has a history of hypertension hyperlipidemia the presents to the emergency department today secondary to acute on chronic kidney injury.  Patient went to see her PCP today for routine follow-up.  They checked her labs and her creatinine came back at 3.9.  Her baseline is around 1.8-1.9.  He recommended come to the emergency room for emergency work-up for acute on chronic kidney disease.  Patient has a little bit of tiredness in the day but denies any significant weakness, shortness of breath, lower extremity swelling, urinary changes, fevers, back pain or other associated symptoms.  She is seeing a nephrologist in the past and is supposed to make an appointment with them but she has not done that yet.       Home Medications Prior to Admission medications   Medication Sig Start Date End Date Taking? Authorizing Provider  acetaminophen (TYLENOL) 325 MG tablet Take 325-650 mg by mouth every 6 (six) hours as needed (pain.).     [provider]  amLODipine (NORVASC) 10 MG tablet Take 1 tablet (10 mg total) by mouth daily. 02/14/22   Wendie Agreste, MD  atorvastatin (LIPITOR) 10 MG tablet Take 1 tablet (10 mg total) by mouth daily. 02/14/22   Wendie Agreste, MD  lisinopril-hydrochlorothiazide (ZESTORETIC) 20-12.5 MG tablet Take 1 tablet by mouth daily. 02/14/22   Wendie Agreste, MD      Allergies    Ibuprofen    Review of Systems   Review of Systems  Physical Exam Updated Vital Signs BP (!) 145/65    Pulse 89    Temp 98 F (36.7 C) (Oral)    Resp 18    Ht 5\' 3"  (1.6 m)    Wt 95.7 kg    SpO2 99%    BMI 37.38 kg/m  Physical Exam Vitals and nursing note reviewed.  Constitutional:      Appearance: She is  well-developed.  HENT:     Head: Normocephalic and atraumatic.     Nose: Nose normal.     Mouth/Throat:     Mouth: Mucous membranes are moist.  Cardiovascular:     Rate and Rhythm: Normal rate and regular rhythm.  Pulmonary:     Effort: No respiratory distress.     Breath sounds: No stridor.  Abdominal:     General: There is no distension.  Musculoskeletal:        General: No swelling or tenderness. Normal range of motion.     Cervical back: Normal range of motion.  Skin:    General: Skin is warm and dry.  Neurological:     General: No focal deficit present.     Mental Status: She is alert.    ED Results / Procedures / Treatments   Labs (all labs ordered are listed, but only abnormal results are displayed) Labs Reviewed  COMPREHENSIVE METABOLIC PANEL - Abnormal; Notable for the following components:      Result Value   CO2 18 (*)    Glucose, Bld 101 (*)    BUN 68 (*)    Creatinine, Ser 4.12 (*)    Calcium 8.7 (*)    AST 14 (*)    Alkaline Phosphatase 223 (*)  GFR, Estimated 11 (*)    All other components within normal limits  I-STAT CHEM 8, ED - Abnormal; Notable for the following components:   Chloride 116 (*)    BUN 57 (*)    Creatinine, Ser 4.30 (*)    Calcium, Ion 1.13 (*)    TCO2 20 (*)    Hemoglobin 10.9 (*)    HCT 32.0 (*)    All other components within normal limits  RESP PANEL BY RT-PCR (FLU A&B, COVID) ARPGX2  HIV ANTIBODY (ROUTINE TESTING W REFLEX)  COMPREHENSIVE METABOLIC PANEL  CBC  APTT  MAGNESIUM  PHOSPHORUS    EKG None  Radiology CT Renal Stone Study  Result Date: 02/15/2022 CLINICAL DATA:  Increase in creatinine EXAM: CT ABDOMEN AND PELVIS WITHOUT CONTRAST TECHNIQUE: Multidetector CT imaging of the abdomen and pelvis was performed following the standard protocol without IV contrast. RADIATION DOSE REDUCTION: This exam was performed according to the departmental dose-optimization program which includes automated exposure control,  adjustment of the mA and/or kV according to patient size and/or use of iterative reconstruction technique. COMPARISON:  None. FINDINGS: Lower chest: No acute abnormality. Hepatobiliary: Gallbladder is well distended with multiple gallstones. No biliary ductal dilatation is seen. The liver is within normal limits. Pancreas: Pancreas is fatty infiltrated particularly in the region of the head. No mass lesion is noted. Spleen: Normal in size without focal abnormality. Adrenals/Urinary Tract: Adrenal glands are within normal limits. No renal or ureteral stones are noted. Bladder is well distended. Stomach/Bowel: The appendix is not well visualized although no inflammatory changes to suggest appendicitis are noted. No obstructive or inflammatory changes of the small bowel are seen. Colon and stomach appear within normal limits. Vascular/Lymphatic: Aortic atherosclerosis. No enlarged abdominal or pelvic lymph nodes. Reproductive: Scattered calcifications are noted throughout the uterus with multiple focal lesions consistent with uterine fibroid change. No adnexal mass is noted. Other: No abdominal wall hernia or abnormality. No abdominopelvic ascites. Musculoskeletal: No acute or significant osseous findings. IMPRESSION: Cholelithiasis without complicating factors. Multiple uterine fibroids. No acute abnormality noted. Electronically Signed   By: Inez Catalina M.D.   On: 02/15/2022 00:01    Procedures Procedures    Medications Ordered in ED Medications  heparin injection 5,000 Units (has no administration in time range)  amLODipine (NORVASC) tablet 10 mg (has no administration in time range)  atorvastatin (LIPITOR) tablet 10 mg (has no administration in time range)  lactated ringers bolus 1,000 mL (0 mLs Intravenous Stopped 02/15/22 0103)  lactated ringers bolus 1,000 mL (0 mLs Intravenous Stopped 02/15/22 0413)    ED Course/ Medical Decision Making/ A&P Clinical Course as of 02/15/22 0419  Thu Feb 15, 2022   0015 CT Renal Laren Everts [JM]  5809 CT Renal Stone Study No obvious obstruction. Will likely need renal US as outpatient [JM]  0016 Creatinine(!): 4.12 Slightly higher but similar to pcp labs [JM]  0016 BP(!): 163/74 [JM]  0016 BP: 116/66 Improved without intervention [JM]    Clinical Course User Index [JM] Gregorey Nabor, Corene Cornea, MD                           Medical Decision Making Amount and/or Complexity of Data Reviewed Labs: ordered. Decision-making details documented in ED Course. Radiology: ordered. Decision-making details documented in ED Course.  Risk Decision regarding hospitalization.   Her BUN/creatinine ratio is around 20 suspect this is likely prerenal.  We will going get a CT stone study just to  evaluate for any obstructive nephropathy.  If this is negative she may need a renal ultrasound but this can be done later in the week.  We will go to fluid hydrate her and recheck her creatinine to make sure it is improving.  If all this goes well she will need follow-up with her nephrologist  Creatinine got worse even after fluids. Discussed with patient. No obvious causes to suggest an appropriate at home treatmetn plan. Will admit to hosptialist for further hydration and workup.    Final Clinical Impression(s) / ED Diagnoses Final diagnoses:  None    Rx / DC Orders ED Discharge Orders     None         Donne Baley, Corene Cornea, MD 02/15/22 [phone]

## 2022-02-15 ENCOUNTER — Inpatient Hospital Stay (HOSPITAL_COMMUNITY): Payer: No Typology Code available for payment source

## 2022-02-15 DIAGNOSIS — Z888 Allergy status to other drugs, medicaments and biological substances status: Secondary | ICD-10-CM | POA: Diagnosis not present

## 2022-02-15 DIAGNOSIS — R748 Abnormal levels of other serum enzymes: Secondary | ICD-10-CM | POA: Diagnosis not present

## 2022-02-15 DIAGNOSIS — Z79899 Other long term (current) drug therapy: Secondary | ICD-10-CM | POA: Diagnosis not present

## 2022-02-15 DIAGNOSIS — N1832 Chronic kidney disease, stage 3b: Secondary | ICD-10-CM

## 2022-02-15 DIAGNOSIS — E872 Acidosis, unspecified: Secondary | ICD-10-CM | POA: Diagnosis not present

## 2022-02-15 DIAGNOSIS — E782 Mixed hyperlipidemia: Secondary | ICD-10-CM

## 2022-02-15 DIAGNOSIS — D649 Anemia, unspecified: Secondary | ICD-10-CM | POA: Diagnosis not present

## 2022-02-15 DIAGNOSIS — E785 Hyperlipidemia, unspecified: Secondary | ICD-10-CM

## 2022-02-15 DIAGNOSIS — E669 Obesity, unspecified: Secondary | ICD-10-CM | POA: Diagnosis not present

## 2022-02-15 DIAGNOSIS — E7800 Pure hypercholesterolemia, unspecified: Secondary | ICD-10-CM | POA: Diagnosis not present

## 2022-02-15 DIAGNOSIS — I129 Hypertensive chronic kidney disease with stage 1 through stage 4 chronic kidney disease, or unspecified chronic kidney disease: Secondary | ICD-10-CM | POA: Diagnosis not present

## 2022-02-15 DIAGNOSIS — N179 Acute kidney failure, unspecified: Secondary | ICD-10-CM | POA: Diagnosis present

## 2022-02-15 DIAGNOSIS — I10 Essential (primary) hypertension: Secondary | ICD-10-CM | POA: Diagnosis not present

## 2022-02-15 DIAGNOSIS — Z20822 Contact with and (suspected) exposure to covid-19: Secondary | ICD-10-CM | POA: Diagnosis not present

## 2022-02-15 DIAGNOSIS — Z9114 Patient's other noncompliance with medication regimen: Secondary | ICD-10-CM | POA: Diagnosis not present

## 2022-02-15 DIAGNOSIS — Z6837 Body mass index (BMI) 37.0-37.9, adult: Secondary | ICD-10-CM | POA: Diagnosis not present

## 2022-02-15 DIAGNOSIS — N184 Chronic kidney disease, stage 4 (severe): Secondary | ICD-10-CM | POA: Diagnosis not present

## 2022-02-15 LAB — COMPREHENSIVE METABOLIC PANEL WITH GFR
ALT: 13 U/L (ref 0–44)
AST: 14 U/L — ABNORMAL LOW (ref 15–41)
Albumin: 3.3 g/dL — ABNORMAL LOW (ref 3.5–5.0)
Alkaline Phosphatase: 211 U/L — ABNORMAL HIGH (ref 38–126)
Anion gap: 9 (ref 5–15)
BUN: 60 mg/dL — ABNORMAL HIGH (ref 8–23)
CO2: 19 mmol/L — ABNORMAL LOW (ref 22–32)
Calcium: 9 mg/dL (ref 8.9–10.3)
Chloride: 113 mmol/L — ABNORMAL HIGH (ref 98–111)
Creatinine, Ser: 3.88 mg/dL — ABNORMAL HIGH (ref 0.44–1.00)
GFR, Estimated: 12 mL/min — ABNORMAL LOW
Glucose, Bld: 100 mg/dL — ABNORMAL HIGH (ref 70–99)
Potassium: 3.9 mmol/L (ref 3.5–5.1)
Sodium: 141 mmol/L (ref 135–145)
Total Bilirubin: 0.4 mg/dL (ref 0.3–1.2)
Total Protein: 7.3 g/dL (ref 6.5–8.1)

## 2022-02-15 LAB — I-STAT CHEM 8, ED
BUN: 57 mg/dL — ABNORMAL HIGH (ref 8–23)
Calcium, Ion: 1.13 mmol/L — ABNORMAL LOW (ref 1.15–1.40)
Chloride: 116 mmol/L — ABNORMAL HIGH (ref 98–111)
Creatinine, Ser: 4.3 mg/dL — ABNORMAL HIGH (ref 0.44–1.00)
Glucose, Bld: 97 mg/dL (ref 70–99)
HCT: 32 % — ABNORMAL LOW (ref 36.0–46.0)
Hemoglobin: 10.9 g/dL — ABNORMAL LOW (ref 12.0–15.0)
Potassium: 4 mmol/L (ref 3.5–5.1)
Sodium: 144 mmol/L (ref 135–145)
TCO2: 20 mmol/L — ABNORMAL LOW (ref 22–32)

## 2022-02-15 LAB — CBC
HCT: 33 % — ABNORMAL LOW (ref 36.0–46.0)
Hemoglobin: 10.4 g/dL — ABNORMAL LOW (ref 12.0–15.0)
MCH: 28.4 pg (ref 26.0–34.0)
MCHC: 31.5 g/dL (ref 30.0–36.0)
MCV: 90.2 fL (ref 80.0–100.0)
Platelets: 275 10*3/uL (ref 150–400)
RBC: 3.66 MIL/uL — ABNORMAL LOW (ref 3.87–5.11)
RDW: 14.6 % (ref 11.5–15.5)
WBC: 11.7 10*3/uL — ABNORMAL HIGH (ref 4.0–10.5)
nRBC: 0 % (ref 0.0–0.2)

## 2022-02-15 LAB — PHOSPHORUS: Phosphorus: 4.8 mg/dL — ABNORMAL HIGH (ref 2.5–4.6)

## 2022-02-15 LAB — MAGNESIUM: Magnesium: 2 mg/dL (ref 1.7–2.4)

## 2022-02-15 LAB — RESP PANEL BY RT-PCR (FLU A&B, COVID) ARPGX2
Influenza A by PCR: NEGATIVE
Influenza B by PCR: NEGATIVE
SARS Coronavirus 2 by RT PCR: NEGATIVE

## 2022-02-15 LAB — HIV ANTIBODY (ROUTINE TESTING W REFLEX): HIV Screen 4th Generation wRfx: NONREACTIVE

## 2022-02-15 LAB — APTT: aPTT: 29 s (ref 24–36)

## 2022-02-15 MED ORDER — AMLODIPINE BESYLATE 5 MG PO TABS
10.0000 mg | ORAL_TABLET | Freq: Every day | ORAL | Status: DC
  Administered 2022-02-15 – 2022-02-16 (×2): 10 mg via ORAL
  Filled 2022-02-15 (×2): qty 2

## 2022-02-15 MED ORDER — HEPARIN SODIUM (PORCINE) 5000 UNIT/ML IJ SOLN
5000.0000 [IU] | Freq: Three times a day (TID) | INTRAMUSCULAR | Status: DC
  Filled 2022-02-15 (×2): qty 1

## 2022-02-15 MED ORDER — LACTATED RINGERS IV SOLN: INTRAVENOUS | Status: DC

## 2022-02-15 MED ORDER — LACTATED RINGERS IV BOLUS
1000.0000 mL | Freq: Once | INTRAVENOUS | Status: AC
  Administered 2022-02-15: 1000 mL via INTRAVENOUS

## 2022-02-15 MED ORDER — ATORVASTATIN CALCIUM 10 MG PO TABS
10.0000 mg | ORAL_TABLET | Freq: Every day | ORAL | Status: DC
  Administered 2022-02-15 – 2022-02-16 (×2): 10 mg via ORAL
  Filled 2022-02-15 (×2): qty 1

## 2022-02-15 NOTE — Assessment & Plan Note (Signed)
-  Continue Lipitor °

## 2022-02-15 NOTE — Assessment & Plan Note (Addendum)
Acute on CKD stage 3B/4 BUN/creatinine 63/3.83 (baseline creatinine at 1.8-1.21) Continue IV hydration Renally adjust medications, avoid nephrotoxic agents/dehydration/hypotension Nephrology will be consulted and we shall await further recommendation

## 2022-02-15 NOTE — Progress Notes (Signed)
°  Transition of Care (TOC) Screening Note   Patient Details  Name: Patricia Wu Date of Birth: [birth year]   Transition of Care [facility]) CM/SW Contact:    Boneta Lucks, RN Phone Number: 02/15/2022, 10:54 AM    Transition of Care Department [facility]) has reviewed patient and no TOC needs have been identified at this time. We will continue to monitor patient advancement through interdisciplinary progression rounds. If new patient transition needs arise, please place a TOC consult.

## 2022-02-15 NOTE — H&P (Signed)
History and Physical    Patient: Patricia Wu IAX:[id number] DOB: 1956/05/10 DOA: 02/14/2022 DOS: the patient was seen and examined on 02/15/2022 PCP: Wendie Agreste, MD  Patient coming from: Home  Chief Complaint:  Chief Complaint  Patient presents with   Abnormal Labs    HPI: Patricia Wu is a 66 y.o. female with medical history significant of essential hypertension, hyperlipidemia, CKD stage 3 who presents to the emergency department from PCPs office.  Patient had a routine follow-up with her PCP yesterday, lab work done showed elevated creatinine level at 3.9 (baseline creatinine at 1.8-2.1) so she was asked to go to the ED for further evaluation and management.  Patient denies any symptoms other than being a little bit tired yesterday, she denies chest pain, shortness of breath, nausea, vomiting, headache, abdominal pain, burning sensation or any other irritative bladder symptoms.  ED course: In the emergency department, she was hemodynamically stable, BP was 159/75 and other vital signs were within normal range.  Work-up in the ED showed normal normocytic anemia, BUN/creatinine 63/3.83 (baseline creatinine at 1.8-1.21), eGFR 11.82. CT abdomen and pelvis without contrast showed cholelithiasis without complicating factors and no acute abnormality was noted. IV hydration was provided and a repeat BMP showed a worsening creatinine now at 4.3, hospitalist was asked to admit patient for further evaluation and management.  Review of Systems: As mentioned in the history of present illness. All other systems reviewed and are negative. Past Medical History:  Diagnosis Date   CKD (chronic kidney disease)    Hypercholesteremia    Hypertension    Noncompliance with medication regimen    Past Surgical History:  Procedure Laterality Date   COLONOSCOPY WITH PROPOFOL N/A 10/17/2020   Procedure: COLONOSCOPY WITH PROPOFOL;  Surgeon: Eloise Harman, DO;  Location: [facility];   Service: Endoscopy;  Laterality: N/A;  1:00pm   POLYPECTOMY  10/17/2020   Procedure: POLYPECTOMY;  Surgeon: Eloise Harman, DO;  Location: [facility] SUITE;  Service: Endoscopy;;   Social History:  reports that she has never smoked. She has never used smokeless tobacco. She reports that she does not drink alcohol and does not use drugs.  Allergies  Allergen Reactions   Ibuprofen Other (See Comments)    pcp said raises her BP    Family History  Problem Relation Age of Onset   Liver cancer Neg Hx    Colon cancer Neg Hx     Prior to Admission medications   Medication Sig Start Date End Date Taking? Authorizing Provider  acetaminophen (TYLENOL) 325 MG tablet Take 325-650 mg by mouth every 6 (six) hours as needed (pain.).     [provider]  amLODipine (NORVASC) 10 MG tablet Take 1 tablet (10 mg total) by mouth daily. 02/14/22   Wendie Agreste, MD  atorvastatin (LIPITOR) 10 MG tablet Take 1 tablet (10 mg total) by mouth daily. 02/14/22   Wendie Agreste, MD  lisinopril-hydrochlorothiazide (ZESTORETIC) 20-12.5 MG tablet Take 1 tablet by mouth daily. 02/14/22   Wendie Agreste, MD    Physical Exam: Vitals:   02/15/22 0000 02/15/22 0030 02/15/22 0100 02/15/22 0200  BP: 113/72 (!) 154/80 (!) 156/74 (!) 145/65  Pulse: 85 79 86 89  Resp: (!) 118 _0 Temp:      TempSrc:      SpO2: 100% 100% 100% 99%  Weight:      Height:       General: Patient was awake and alert  and oriented x3. Not in any acute distress.  HEENT: NCAT.  PERRLA. EOMI. Sclerae anicteric.  Moist mucosal membranes. Neck: Neck supple without lymphadenopathy. No carotid bruits. No masses palpated.  Cardiovascular: Regular rate with normal S1-S2 sounds. No murmurs, rubs or gallops auscultated. No JVD.  Respiratory: Clear breath sounds.  No accessory muscle use. Abdomen: Soft, nontender, nondistended. Active bowel sounds. No masses or hepatosplenomegaly  Skin: No rashes, lesions, or ulcerations.  Dry,  warm to touch. Musculoskeletal:  2+ dorsalis pedis and radial pulses. Good ROM.  No contractures  Psychiatric: Intact judgment and insight.  Mood appropriate to current condition. Neurologic: No focal neurological deficits. Strength is 5/5 x 4.  CN II - XII grossly intact.   Data Reviewed: There are no new results to review at this time.  Assessment and Plan: * Acute-on-chronic kidney injury (Ray)- (present on admission) Acute on CKD stage 3B/4 BUN/creatinine 63/3.83 (baseline creatinine at 1.8-1.21) Continue IV hydration Renally adjust medications, avoid nephrotoxic agents/dehydration/hypotension Nephrology will be consulted and we shall await further recommendation  Obesity (BMI 30-39.9) Diet and lifestyle modification  Hyperlipidemia Continue Lipitor  Elevated alkaline phosphatase level- (present on admission) Alkaline phosphatase 250 > 223; this is chronic Continue to monitor liver enzymes  Essential hypertension- (present on admission) BP uncontrolled; continue Norvasc and Zestoretic   Advance Care Planning:   Code Status: Full Code   Consults:   Family Communication: Daughter at bedside (all questions answered to satisfaction)  Severity of Illness: The appropriate patient status for this patient is OBSERVATION. Observation status is judged to be reasonable and necessary in order to provide the required intensity of service to ensure the patient's safety. The patient's presenting symptoms, physical exam findings, and initial radiographic and laboratory data in the context of their medical condition is felt to place them at decreased risk for further clinical deterioration. Furthermore, it is anticipated that the patient will be medically stable for discharge from the hospital within 2 midnights of admission.   Author: Bernadette Hoit, DO 02/15/2022 4:02 AM  For on call review [email or web address].

## 2022-02-15 NOTE — Assessment & Plan Note (Signed)
Diet and lifestyle modification

## 2022-02-15 NOTE — Progress Notes (Signed)
Pt and husband had concerns and wanted to speak with the MD. MD Irene Pap, MD called pt and husband and updated them.

## 2022-02-15 NOTE — Progress Notes (Signed)
Patricia Wu is a 66 y.o. female with medical history significant of essential hypertension, hyperlipidemia, CKD stage 3 who presents to the emergency department from PCPs office.  Patient had a routine follow-up with her PCP the day prior to her presentation, lab work done showed elevated creatinine level at 3.9 (baseline creatinine at 1.8-2.1) so she was asked to go to the ED for further evaluation and management.  Work-up revealed AKI on CKD 3B with unclear etiology.  Will obtain renal ultrasound and continue IV fluid hydration.  Nephrology has been consulted.  02/15/2022: Patient was seen and examined at bedside while in the ED.  There were no acute events overnight.  She has no new complaints.  She denies dysuria or oliguria.  Please refer to H&P dictated by partner Dr. Josephine Cables on 02/15/2022 for further details of the assessment and plan.

## 2022-02-15 NOTE — Assessment & Plan Note (Addendum)
BP uncontrolled; continue Norvasc

## 2022-02-15 NOTE — Assessment & Plan Note (Signed)
Alkaline phosphatase 250 > 223; this is chronic Continue to monitor liver enzymes

## 2022-02-16 DIAGNOSIS — N184 Chronic kidney disease, stage 4 (severe): Secondary | ICD-10-CM | POA: Diagnosis not present

## 2022-02-16 DIAGNOSIS — I129 Hypertensive chronic kidney disease with stage 1 through stage 4 chronic kidney disease, or unspecified chronic kidney disease: Secondary | ICD-10-CM | POA: Diagnosis not present

## 2022-02-16 DIAGNOSIS — N1832 Chronic kidney disease, stage 3b: Secondary | ICD-10-CM | POA: Diagnosis not present

## 2022-02-16 DIAGNOSIS — D649 Anemia, unspecified: Secondary | ICD-10-CM | POA: Diagnosis not present

## 2022-02-16 DIAGNOSIS — N179 Acute kidney failure, unspecified: Secondary | ICD-10-CM | POA: Diagnosis not present

## 2022-02-16 DIAGNOSIS — E872 Acidosis, unspecified: Secondary | ICD-10-CM | POA: Diagnosis not present

## 2022-02-16 LAB — RENAL FUNCTION PANEL
Albumin: 3.1 g/dL — ABNORMAL LOW (ref 3.5–5.0)
Anion gap: 8 (ref 5–15)
BUN: 54 mg/dL — ABNORMAL HIGH (ref 8–23)
CO2: 21 mmol/L — ABNORMAL LOW (ref 22–32)
Calcium: 9 mg/dL (ref 8.9–10.3)
Chloride: 115 mmol/L — ABNORMAL HIGH (ref 98–111)
Creatinine, Ser: 3.6 mg/dL — ABNORMAL HIGH (ref 0.44–1.00)
GFR, Estimated: 13 mL/min — ABNORMAL LOW (ref >60)
Glucose, Bld: 94 mg/dL (ref 70–99)
Phosphorus: 4.5 mg/dL (ref 2.5–4.6)
Potassium: 4.2 mmol/L (ref 3.5–5.1)
Sodium: 144 mmol/L (ref 135–145)

## 2022-02-16 LAB — CBC
HCT: 30.6 % — ABNORMAL LOW (ref 36.0–46.0)
Hemoglobin: 9.6 g/dL — ABNORMAL LOW (ref 12.0–15.0)
MCH: 28.7 pg (ref 26.0–34.0)
MCHC: 31.4 g/dL (ref 30.0–36.0)
MCV: 91.6 fL (ref 80.0–100.0)
Platelets: 249 10*3/uL (ref 150–400)
RBC: 3.34 MIL/uL — ABNORMAL LOW (ref 3.87–5.11)
RDW: 14.4 % (ref 11.5–15.5)
WBC: 11.1 10*3/uL — ABNORMAL HIGH (ref 4.0–10.5)
nRBC: 0 % (ref 0.0–0.2)

## 2022-02-16 LAB — MAGNESIUM: Magnesium: 2 mg/dL (ref 1.7–2.4)

## 2022-02-16 NOTE — Progress Notes (Signed)
Patient has had no c/o pain this shift.  Patient has voided several times, but also has incontinents (patient wears incontinence briefs).  Collection container placed in bathroom to trap I&O as able. Vitals stable and no new issues this shift.

## 2022-02-16 NOTE — Discharge Summary (Addendum)
Discharge Summary  [facility] Te[facility]arden Oakley CWC:[id number] DOB: 09/23/1956  PCP: Wendie Agreste, MD  Admit date: 02/14/2022 Discharge date: 02/16/2022  Time spent: 35 minutes.  Recommendations for Outpatient Follow-up:  Follow-up with nephrology, Dr. Joylene Grapes within a week. Repeat BMP on Monday, 02/19/2022 as recommended by nephrology. Continue to hold off home HCTZ and lisinopril. Follow-up with your PCP in 1 to 2 weeks.   Discharge Diagnoses:  Active Hospital Problems   Diagnosis Date Noted   Acute-on-chronic kidney injury ([place]) 02/15/2022   Hyperlipidemia 02/15/2022   Obesity (BMI 30-39.9) 02/15/2022   Elevated alkaline phosphatase level 09/21/2020   Essential hypertension 02/18/2018    Resolved Hospital Problems  No resolved problems to display.    Discharge Condition: Stable stable.  Diet recommendation: Resume previous diet.  Vitals:   02/16/22 0441 02/16/22 1306  BP: 119/84 (!) 147/70  Pulse: 82 80  Resp: 17 17  Temp: 98.4 F (36.9 C)   SpO2: 98% 100%    History of present illness:  Patricia Wu is a 66 y.o. female with medical history significant of essential hypertension, hyperlipidemia, CKD stage 3B who presented to [facility] ED from PCPs office due to abnormal labs with creatinine level at 3.9 from baseline of 1.8.  Work-up revealed AKI on CKD 3B of unclear etiology.  Renal ultrasound showed no evidence of hydronephrosis, kidney stones or masses, showed medical renal disease.  Received V fluid hydration.  Nephrology consulted.   02/16/2022:  Seen and examined at her bedside.  Her daughter was present in the room.  She has no new complaints.  All questions answered to the best of my ability.  Seen by nephrology, DC'd IV fluid, okay to discharge from nephrology standpoint.  Recommended to continue to hold off home HCTZ and home lisinopril and to repeat BMP on Monday, 02/19/2022.  Dr. Joylene Grapes will follow up outpatient.   Hospital Course:  Principal Problem:    Acute-on-chronic kidney injury [facility]) Active Problems:   Essential hypertension   Elevated alkaline phosphatase level   Hyperlipidemia   Obesity (BMI 30-39.9)  Acute-on-chronic kidney injury ([place])- (present on admission) Acute on CKD stage 3B Baseline creatinine 1.9 with GFR 32. Creatinine peaked at 4.3 and downtrending 3.6. Continue to avoid nephrotoxic agents, NSAIDs, dehydration and hypotension. Closely monitor renal progress without symptoms. Repeat renal panel in the morning. Nephrology consulted, Lewis IV fluid since no evidence of hypovolemia.   Repeat BMP on Monday, 02/19/2022. Dr. Joylene Grapes will see outpatient.   Improving Non anion gap metabolic acidosis in the setting of acute kidney injury Serum bicarb uptrending, 21 from 19. Follow-up with nephrology.   Hyperphosphatemia in the setting of acute kidney injury Serum phosphorus normalized 4.5 from 4.8.   Obesity (BMI 30-39.9) Diet and lifestyle modification Recommend weight loss outpatient regular physical activity and healthy dieting.   Hyperlipidemia Continue Lipitor   Elevated alkaline phosphatase level- (present on admission) Alkaline phosphatase 250 > 223; this is chronic Follow-up with your primary care provider.   Essential hypertension- (present on admission) BP uncontrolled; continue Norvasc Continue to hold off home HCTZ and home lisinopril. Follow-up with your primary care provider in 1 to 2 weeks.     Code Status: Full Code    Consults: Nephrology.   Discharge Exam: BP (!) 147/70 (BP Location: Right Arm)    Pulse 80    Temp 98.4 F (36.9 C)    Resp 17    Ht 5\' 3"  (1.6 m)    Wt 96.7 kg  SpO2 100%    BMI 37.75 kg/m  General: 66 y.o. year-old female well developed well nourished in no acute distress.  Alert and oriented x3. Cardiovascular: Regular rate and rhythm with no rubs or gallops.  No thyromegaly or JVD noted.   Respiratory: Clear to auscultation with no wheezes or rales. Good inspiratory  effort. Abdomen: Soft nontender nondistended with normal bowel sounds x4 quadrants. Musculoskeletal: No lower extremity edema. 2/4 pulses in all 4 extremities. Skin: No ulcerative lesions noted or rashes, Psychiatry: Mood is appropriate for condition and setting  Discharge Instructions You were cared for by a hospitalist during your hospital stay. If you have any questions about your discharge medications or the care you received while you were in the hospital after you are discharged, you can call the unit and asked to speak with the hospitalist on call if the hospitalist that took care of you is not available. Once you are discharged, your primary care physician will handle any further medical issues. Please note that NO REFILLS for any discharge medications will be authorized once you are discharged, as it is imperative that you return to your primary care physician (or establish a relationship with a primary care physician if you do not have one) for your aftercare needs so that they can reassess your need for medications and monitor your lab values.   Allergies as of 02/16/2022       Reactions   Ibuprofen Other (See Comments)   pcp said raises her BP        Medication List     STOP taking these medications    lisinopril-hydrochlorothiazide 20-12.5 MG tablet Commonly known as: ZESTORETIC       TAKE these medications    acetaminophen 325 MG tablet Commonly known as: TYLENOL Take 325-650 mg by mouth every 6 (six) hours as needed (pain.).   amLODipine 10 MG tablet Commonly known as: NORVASC Take 1 tablet (10 mg total) by mouth daily.   atorvastatin 10 MG tablet Commonly known as: LIPITOR Take 1 tablet (10 mg total) by mouth daily.       Allergies  Allergen Reactions   Ibuprofen Other (See Comments)    pcp said raises her BP    Follow-up Information     Wendie Agreste, MD. Call today.   Specialties: Family Medicine, Sports Medicine Why: Please call for a  posthospital follow-up appointment. Contact information: [place] [address] [phone]         Herminio Commons, MD .   Specialty: Cardiology Contact information: [place] [address] [phone]         Reesa Chew, MD. Call today.   Specialty: Internal Medicine Why: Please call for a posthospital follow-up. Contact information: [place] [address] [phone]                  The results of significant diagnostics from this hospitalization (including imaging, microbiology, ancillary and laboratory) are listed below for reference.    Significant Diagnostic Studies: US RENAL  Result Date: 02/15/2022 CLINICAL DATA:  Acute kidney injury EXAM: RENAL / URINARY TRACT ULTRASOUND COMPLETE COMPARISON:  CT abdomen and pelvis 02/14/2022. FINDINGS: Right Kidney: Renal measurements: 8.5 x 4.0 x 4.2 cm = volume: 75 mL. Mild cortical thinning. Markedly increased cortical echogenicity. No mass, hydronephrosis, or shadowing calcification. Left Kidney: Renal measurements: 8.8 x 4.5 x 4.3 cm = volume: 89 mL. Mild cortical thinning. Marked increased cortical echogenicity. No  mass, hydronephrosis, or shadowing calcification. Bladder: Appears normal for degree of bladder distention. Other: N/A IMPRESSION: Medical renal disease changes and atrophy of both kidneys. No evidence of renal mass or hydronephrosis. Electronically Signed   By: Lavonia Dana M.D.   On: 02/15/2022 17:04   CT Renal Stone Study  Result Date: 02/15/2022 CLINICAL DATA:  Increase in creatinine EXAM: CT ABDOMEN AND PELVIS WITHOUT CONTRAST TECHNIQUE: Multidetector CT imaging of the abdomen and pelvis was performed following the standard protocol without IV contrast. RADIATION DOSE REDUCTION: This exam was performed according to the departmental dose-optimization program which includes automated exposure control, adjustment of the mA and/or kV according to patient size  and/or use of iterative reconstruction technique. COMPARISON:  None. FINDINGS: Lower chest: No acute abnormality. Hepatobiliary: Gallbladder is well distended with multiple gallstones. No biliary ductal dilatation is seen. The liver is within normal limits. Pancreas: Pancreas is fatty infiltrated particularly in the region of the head. No mass lesion is noted. Spleen: Normal in size without focal abnormality. Adrenals/Urinary Tract: Adrenal glands are within normal limits. No renal or ureteral stones are noted. Bladder is well distended. Stomach/Bowel: The appendix is not well visualized although no inflammatory changes to suggest appendicitis are noted. No obstructive or inflammatory changes of the small bowel are seen. Colon and stomach appear within normal limits. Vascular/Lymphatic: Aortic atherosclerosis. No enlarged abdominal or pelvic lymph nodes. Reproductive: Scattered calcifications are noted throughout the uterus with multiple focal lesions consistent with uterine fibroid change. No adnexal mass is noted. Other: No abdominal wall hernia or abnormality. No abdominopelvic ascites. Musculoskeletal: No acute or significant osseous findings. IMPRESSION: Cholelithiasis without complicating factors. Multiple uterine fibroids. No acute abnormality noted. Electronically Signed   By: Inez Catalina M.D.   On: 02/15/2022 00:01    Microbiology: Recent Results (from the past 240 hour(s))  Resp Panel by RT-PCR (Flu A&B, Covid) Nasopharyngeal Swab     Status: None   Collection Time: 02/15/22  2:11 AM   Specimen: Nasopharyngeal Swab; Nasopharyngeal(NP) swabs in vial transport medium  Result Value Ref Range Status   SARS Coronavirus 2 by RT PCR NEGATIVE NEGATIVE Final    Comment: (NOTE) SARS-CoV-2 target nucleic acids are NOT DETECTED.  The SARS-CoV-2 RNA is generally detectable in upper respiratory specimens during the acute phase of infection. The lowest concentration of SARS-CoV-2 viral copies this assay  can detect is 138 copies/mL. A negative result does not preclude SARS-Cov-2 infection and should not be used as the sole basis for treatment or other patient management decisions. A negative result may occur with  improper specimen collection/handling, submission of specimen other than nasopharyngeal swab, presence of viral mutation(s) within the areas targeted by this assay, and inadequate number of viral copies(<138 copies/mL). A negative result must be combined with clinical observations, patient history, and epidemiological information. The expected result is Negative.  Fact Sheet for Patients:  EntrepreneurPulse.com.au  Fact Sheet for Healthcare Providers:  IncredibleEmployment.be  This test is no t yet approved or cleared by the [place] FDA and  has been authorized for detection and/or diagnosis of SARS-CoV-2 by FDA under an Emergency Use Authorization (EUA). This EUA will remain  in effect (meaning this test can be used) for the duration of the COVID-19 declaration under Section 564(b)(1) of the Act, 21 U.S.C.section 360bbb-3(b)(1), unless the authorization is terminated  or revoked sooner.       Influenza A by PCR NEGATIVE NEGATIVE Final   Influenza B by PCR NEGATIVE NEGATIVE Final  Comment: (NOTE) The Xpert Xpress SARS-CoV-2/FLU/RSV plus assay is intended as an aid in the diagnosis of influenza from Nasopharyngeal swab specimens and should not be used as a sole basis for treatment. Nasal washings and aspirates are unacceptable for Xpert Xpress SARS-CoV-2/FLU/RSV testing.  Fact Sheet for Patients: EntrepreneurPulse.com.au  Fact Sheet for Healthcare Providers: IncredibleEmployment.be  This test is not yet approved or cleared by the [place] FDA and has been authorized for detection and/or diagnosis of SARS-CoV-2 by FDA under an Emergency Use Authorization (EUA). This EUA will  remain in effect (meaning this test can be used) for the duration of the COVID-19 declaration under Section 564(b)(1) of the Act, 21 U.S.C. section 360bbb-3(b)(1), unless the authorization is terminated or revoked.  Performed at [facility], [address], [place], [place] [address]      Labs: Basic Metabolic Panel: Recent Labs  Lab 02/14/22 0919 02/14/22 2252 02/15/22 0102 02/15/22 0619 02/16/22 0523  NA 142 140 144 141 144  K 4.7 4.2 4.0 3.9 4.2  CL 110 111 116* 113* 115*  CO2 24 18*  --  19* 21*  GLUCOSE 95 101* 97 100* 94  BUN 63* 68* 57* 60* 54*  CREATININE 3.83* 4.12* 4.30* 3.88* 3.60*  CALCIUM 9.6 8.7*  --  9.0 9.0  MG  --   --   --  2.0 2.0  PHOS  --   --   --  4.8* 4.5   Liver Function Tests: Recent Labs  Lab 02/14/22 0919 02/14/22 2252 02/15/22 0619 02/16/22 0523  AST 10 14* 14*  --   ALT 9 12 13   --   ALKPHOS 250* 223* 211*  --   BILITOT 0.5 0.6 0.4  --   PROT 7.5 7.9 7.3  --   ALBUMIN 4.2 3.7 3.3* 3.1*   No results for input(s): LIPASE, AMYLASE in the last 168 hours. No results for input(s): AMMONIA in the last 168 hours. CBC: Recent Labs  Lab 02/14/22 0919 02/15/22 0102 02/15/22 0619 02/16/22 0523  WBC 9.6  --  11.7* 11.1*  HGB 11.1* 10.9* 10.4* 9.6*  HCT 33.9* 32.0* 33.0* 30.6*  MCV 85.6  --  90.2 91.6  PLT 287.0  --  275 249   Cardiac Enzymes: No results for input(s): CKTOTAL, CKMB, CKMBINDEX, TROPONINI in the last 168 hours. BNP: BNP (last 3 results) No results for input(s): BNP in the last 8760 hours.  ProBNP (last 3 results) No results for input(s): PROBNP in the last 8760 hours.  CBG: No results for input(s): GLUCAP in the last 168 hours.     Signed:  Kayleen Memos, MD [facility] 02/16/2022, 2:28 PM

## 2022-02-16 NOTE — Progress Notes (Signed)
Nsg Discharge Note  Admit Date:  02/14/2022 Discharge date: 02/16/2022   Patricia Wu to be D/C'd Home per MD order.  AVS completed.  Copy for chart, and copy for patient signed, and dated. Patient/caregiver able to verbalize understanding.  Discharge Medication: Allergies as of 02/16/2022       Reactions   Ibuprofen Other (See Comments)   pcp said raises her BP        Medication List     STOP taking these medications    lisinopril-hydrochlorothiazide 20-12.5 MG tablet Commonly known as: ZESTORETIC       TAKE these medications    acetaminophen 325 MG tablet Commonly known as: TYLENOL Take 325-650 mg by mouth every 6 (six) hours as needed (pain.).   amLODipine 10 MG tablet Commonly known as: NORVASC Take 1 tablet (10 mg total) by mouth daily.   atorvastatin 10 MG tablet Commonly known as: LIPITOR Take 1 tablet (10 mg total) by mouth daily.        Discharge Assessment: Vitals:   02/16/22 0441 02/16/22 1306  BP: 119/84 (!) 147/70  Pulse: 82 80  Resp: 17 17  Temp: 98.4 F (36.9 C)   SpO2: 98% 100%   Skin clean, dry and intact without evidence of skin break down, no evidence of skin tears noted. IV catheter discontinued intact. Site without signs and symptoms of complications - no redness or edema noted at insertion site, patient denies c/o pain - only slight tenderness at site.  Dressing with slight pressure applied.  D/c Instructions-Education: Discharge instructions given to patient/family with verbalized understanding. D/c education completed with patient/family including follow up instructions, medication list, d/c activities limitations if indicated, with other d/c instructions as indicated by MD - patient able to verbalize understanding, all questions fully answered. Patient instructed to return to ED, call 911, or call MD for any changes in condition.  Patient escorted via [place], and D/C home via private auto.  Clovis Fredrickson, LPN 12/24/7508 2:58  PM

## 2022-02-16 NOTE — Progress Notes (Signed)
PROGRESS NOTE  Patricia Wu YPP:[id number] DOB: [birth year] DOA: 02/14/2022 PCP: Wendie Agreste, MD  HPI/Recap of past 24 hours: Patricia Wu is a 66 y.o. female with medical history significant of essential hypertension, hyperlipidemia, CKD stage 3B who presented to [facility] ED from PCPs office due to abnormal labs with creatinine level at 3.9 from baseline of 1.8.  Work-up revealed AKI on CKD 3B of unclear etiology.  Renal ultrasound showed no evidence of hydronephrosis, kidney stones or masses, showed medical renal disease.  Received V fluid hydration.  Nephrology consulted.   02/16/2022:  Seen and examined at her bedside.  Her daughter was present in the room.  She has no new complaints.  All questions answered to the best of my ability.  Seen by nephrology, will follow up outpatient.    Assessment/Plan: Principal Problem:   Acute-on-chronic kidney injury ([place]) Active Problems:   Essential hypertension   Elevated alkaline phosphatase level   Hyperlipidemia   Obesity (BMI 30-39.9)  Acute-on-chronic kidney injury ([place])- (present on admission) Acute on CKD stage 3B Baseline creatinine 1.9 with GFR 32. Creatinine peaked at 4.3 and downtrending. Continue to avoid nephrotoxic agents, NSAIDs, dehydration and hypotension. Closely monitor renal progress without symptoms. Repeat renal panel in the morning. Nephrology consulted, following, and will see outpatient.  No anion gap metabolic acidosis in the setting of acute kidney injury Serum bicarb uptrending, 21 from 19. Continue IV fluid hydration.  Hyperphosphatemia in the setting of acute kidney injury Serum phosphorus normalized 4.5 from 4.8. Repeat renal function test tomorrow   Obesity (BMI 30-39.9) Diet and lifestyle modification Recommend weight loss outpatient regular physical activity and healthy dieting.   Hyperlipidemia Continue Lipitor   Elevated alkaline phosphatase level- (present on admission) Alkaline  phosphatase 250 > 223; this is chronic Continue to monitor liver enzymes   Essential hypertension- (present on admission) BP uncontrolled; continue Norvasc Continue to hold off home HCTZ and home lisinopril.     Advance Care Planning:   Code Status: Full Code    Consults: Nephrology.   Family Communication: Updated her daughter at bedside.   Severity of Illness:     Status is: Inpatient  Patient requires at least 2 midnights for further evaluation and treatment of present condition.      Objective: Vitals:   02/15/22 1350 02/15/22 2126 02/16/22 0441 02/16/22 1306  BP: 129/69 (!) 155/83 119/84 (!) 147/70  Pulse: 78 86 82 80  Resp:  19 17 17   Temp: 97.9 F (36.6 C) 98 F (36.7 C) 98.4 F (36.9 C)   TempSrc: Oral Oral    SpO2: 100% 100% 98% 100%  Weight:      Height:        Intake/Output Summary (Last 24 hours) at 02/16/2022 1350 Last data filed at 02/16/2022 1100 Gross per 24 hour  Intake 480 ml  Output 550 ml  Net -70 ml   Filed Weights   02/14/22 1736 02/15/22 0806  Weight: 95.7 kg 96.7 kg    Exam:  General: 66 y.o. year-old female well developed well nourished in no acute distress.  Alert and oriented x3. Cardiovascular: Regular rate and rhythm with no rubs or gallops.  No thyromegaly or JVD noted.   Respiratory: Clear to auscultation with no wheezes or rales. Good inspiratory effort. Abdomen: Soft nontender nondistended with normal bowel sounds x4 quadrants. Musculoskeletal: No lower extremity edema. 2/4 pulses in all 4 extremities. Skin: No ulcerative lesions noted or rashes, Psychiatry: Mood is appropriate for condition  and setting   Data Reviewed: CBC: Recent Labs  Lab 02/14/22 0919 02/15/22 0102 02/15/22 0619 02/16/22 0523  WBC 9.6  --  11.7* 11.1*  HGB 11.1* 10.9* 10.4* 9.6*  HCT 33.9* 32.0* 33.0* 30.6*  MCV 85.6  --  90.2 91.6  PLT 287.0  --  275 440   Basic Metabolic Panel: Recent Labs  Lab 02/14/22 0919 02/14/22 2252  02/15/22 0102 02/15/22 0619 02/16/22 0523  NA 142 140 144 141 144  K 4.7 4.2 4.0 3.9 4.2  CL 110 111 116* 113* 115*  CO2 24 18*  --  19* 21*  GLUCOSE 95 101* 97 100* 94  BUN 63* 68* 57* 60* 54*  CREATININE 3.83* 4.12* 4.30* 3.88* 3.60*  CALCIUM 9.6 8.7*  --  9.0 9.0  MG  --   --   --  2.0 2.0  PHOS  --   --   --  4.8* 4.5   GFR: Estimated Creatinine Clearance: 17.2 mL/min (A) (by C-G formula based on SCr of 3.6 mg/dL (H)). Liver Function Tests: Recent Labs  Lab 02/14/22 0919 02/14/22 2252 02/15/22 0619 02/16/22 0523  AST 10 14* 14*  --   ALT 9 12 13   --   ALKPHOS 250* 223* 211*  --   BILITOT 0.5 0.6 0.4  --   PROT 7.5 7.9 7.3  --   ALBUMIN 4.2 3.7 3.3* 3.1*   No results for input(s): LIPASE, AMYLASE in the last 168 hours. No results for input(s): AMMONIA in the last 168 hours. Coagulation Profile: No results for input(s): INR, PROTIME in the last 168 hours. Cardiac Enzymes: No results for input(s): CKTOTAL, CKMB, CKMBINDEX, TROPONINI in the last 168 hours. BNP (last 3 results) No results for input(s): PROBNP in the last 8760 hours. HbA1C: No results for input(s): HGBA1C in the last 72 hours. CBG: No results for input(s): GLUCAP in the last 168 hours. Lipid Profile: Recent Labs    02/14/22 0919  CHOL 201*  HDL 50.40  LDLCALC 134*  TRIG 84.0  CHOLHDL 4   Thyroid Function Tests: No results for input(s): TSH, T4TOTAL, FREET4, T3FREE, THYROIDAB in the last 72 hours. Anemia Panel: No results for input(s): VITAMINB12, FOLATE, FERRITIN, TIBC, IRON, RETICCTPCT in the last 72 hours. Urine analysis:    Component Value Date/Time   COLORURINE YELLOW 08/02/2009 1702   APPEARANCEUR CLEAR 08/02/2009 1702   LABSPEC 1.010 08/02/2009 1702   PHURINE 7.0 08/02/2009 1702   GLUCOSEU NEGATIVE 08/02/2009 1702   HGBUR SMALL (A) 08/02/2009 1702   BILIRUBINUR NEGATIVE 08/02/2009 1702   KETONESUR NEGATIVE 08/02/2009 1702   PROTEINUR 30 (A) 08/02/2009 1702   UROBILINOGEN 0.2  08/02/2009 1702   NITRITE NEGATIVE 08/02/2009 1702   LEUKOCYTESUR NEGATIVE 08/02/2009 1702   Sepsis Labs: @LABRCNTIP (procalcitonin:4,lacticidven:4)  ) Recent Results (from the past 240 hour(s))  Resp Panel by RT-PCR (Flu A&B, Covid) Nasopharyngeal Swab     Status: None   Collection Time: 02/15/22  2:11 AM   Specimen: Nasopharyngeal Swab; Nasopharyngeal(NP) swabs in vial transport medium  Result Value Ref Range Status   SARS Coronavirus 2 by RT PCR NEGATIVE NEGATIVE Final    Comment: (NOTE) SARS-CoV-2 target nucleic acids are NOT DETECTED.  The SARS-CoV-2 RNA is generally detectable in upper respiratory specimens during the acute phase of infection. The lowest concentration of SARS-CoV-2 viral copies this assay can detect is 138 copies/mL. A negative result does not preclude SARS-Cov-2 infection and should not be used as the sole basis for treatment or  other patient management decisions. A negative result may occur with  improper specimen collection/handling, submission of specimen other than nasopharyngeal swab, presence of viral mutation(s) within the areas targeted by this assay, and inadequate number of viral copies(<138 copies/mL). A negative result must be combined with clinical observations, patient history, and epidemiological information. The expected result is Negative.  Fact Sheet for Patients:  EntrepreneurPulse.com.au  Fact Sheet for Healthcare Providers:  IncredibleEmployment.be  This test is no t yet approved or cleared by the [place] FDA and  has been authorized for detection and/or diagnosis of SARS-CoV-2 by FDA under an Emergency Use Authorization (EUA). This EUA will remain  in effect (meaning this test can be used) for the duration of the COVID-19 declaration under Section 564(b)(1) of the Act, 21 U.S.C.section 360bbb-3(b)(1), unless the authorization is terminated  or revoked sooner.       Influenza A by PCR  NEGATIVE NEGATIVE Final   Influenza B by PCR NEGATIVE NEGATIVE Final    Comment: (NOTE) The Xpert Xpress SARS-CoV-2/FLU/RSV plus assay is intended as an aid in the diagnosis of influenza from Nasopharyngeal swab specimens and should not be used as a sole basis for treatment. Nasal washings and aspirates are unacceptable for Xpert Xpress SARS-CoV-2/FLU/RSV testing.  Fact Sheet for Patients: EntrepreneurPulse.com.au  Fact Sheet for Healthcare Providers: IncredibleEmployment.be  This test is not yet approved or cleared by the [place] FDA and has been authorized for detection and/or diagnosis of SARS-CoV-2 by FDA under an Emergency Use Authorization (EUA). This EUA will remain in effect (meaning this test can be used) for the duration of the COVID-19 declaration under Section 564(b)(1) of the Act, 21 U.S.C. section 360bbb-3(b)(1), unless the authorization is terminated or revoked.  Performed at [facility], [address] [place], [place] [address]       Studies: US RENAL  Result Date: 02/15/2022 CLINICAL DATA:  Acute kidney injury EXAM: RENAL / URINARY TRACT ULTRASOUND COMPLETE COMPARISON:  CT abdomen and pelvis 02/14/2022. FINDINGS: Right Kidney: Renal measurements: 8.5 x 4.0 x 4.2 cm = volume: 75 mL. Mild cortical thinning. Markedly increased cortical echogenicity. No mass, hydronephrosis, or shadowing calcification. Left Kidney: Renal measurements: 8.8 x 4.5 x 4.3 cm = volume: 89 mL. Mild cortical thinning. Marked increased cortical echogenicity. No mass, hydronephrosis, or shadowing calcification. Bladder: Appears normal for degree of bladder distention. Other: N/A IMPRESSION: Medical renal disease changes and atrophy of both kidneys. No evidence of renal mass or hydronephrosis. Electronically Signed   By: Lavonia Dana M.D.   On: 02/15/2022 17:04    Scheduled Meds:  amLODipine  10 mg Oral Daily   atorvastatin  10 mg Oral Daily   heparin   5,000 Units Subcutaneous Q8H    Continuous Infusions:   LOS: 1 day     Patricia Memos, MD [facility] Pager [phone]  If 7PM-7AM, please contact night-coverage [email or web address] Password [email or web address] 02/16/2022, 1:50 PM

## 2022-02-16 NOTE — Consult Note (Addendum)
Nephrology Consult   Requesting provider: Irene Pap Service requesting consult: Hospitalist Reason for consult: AKI on CKD IV   Assessment/Recommendations: Patricia Wu is a/an 66 y.o. female with a past medical history HTN, HLD, CKD who present w/ AKI on CKD IV   Non-Oliguric AKI on CKD IV: BL Crt around 2 now w/ AKI that is improving. Possibly this was due to over-treatment of BP. Fortunately she is completely asymptomatic. No signs of dehydration at this time so can stop fluids. -Can stop fluids -Okay to DC from nephrology stand point with labs on Monday -Would hold lisinopril-hctz at DC -Needs f/u with nephrology in 2-4 weeks -Continue to monitor daily Cr, Dose meds for GFR -Monitor Daily I/Os, Daily weight  -Maintain MAP>65 for optimal renal perfusion.  -Avoid nephrotoxic medications including NSAIDs -Use synthetic opioids (Fentanyl/Dilaudid) if needed -Obtaining UA today to complete AKI work up  HTN: BP up-and-down -Continue amlodipine -Hold lisinopril and hydrochlorothiazide at discharge -Reevaluate outpatient  Metabolic acidosis: Bicarb 21 today.  No supplementation needed at discharge.  Continue to monitor outpatient  Anemia: Hemoglobin 9.6.  Possibly some dilution related to aggressive IV fluids.  Was 10.4 the day before.  Follow-up outpatient to assess iron status  HLD: can continue statin   Recommendations conveyed to primary service.    [facility] 02/16/2022 11:52 AM   _____________________________________________________________________________________ CC: AKI on CKDIV  History of Present Illness: Patricia Wu is a/an 66 y.o. female with a past medical history of HTN, HLD, CKD IV who presents with abnormal labs.  Patient saw her PCP for routine appointment.  She had labs done that showed a creatinine of 3.9 and it was recommended that she come to the hospital.  Baseline creatinine is around 2 with a GFR of 26 given  her CKD 4.  She is seen Dr. Marval Regal in the past but not since November of 2021.  The patient states she is felt fine and specifically denies fevers, chills, shortness of breath, chest pain, nausea, vomiting, diarrhea, dysuria, hematuria.  She checks her blood pressure at home and states that typically it is around 115.  Denies any significant dizziness or lightheadedness.  Has been compliant with her medications. No NSAID use.  She had a renal ultrasound that showed atrophic kidneys but no other major concern.  She was given IV hydration and creatinine has improved from a max of 4.3 down to 3.6 today.  The patient's lisinopril and hydrochlorothiazide have been held.   Medications:  Current Facility-Administered Medications  Medication Dose Route Frequency Provider Last Rate Last Admin   amLODipine (NORVASC) tablet 10 mg  10 mg Oral Daily Adefeso, Oladapo, DO   10 mg at 02/16/22 0815   atorvastatin (LIPITOR) tablet 10 mg  10 mg Oral Daily Adefeso, Oladapo, DO   10 mg at 02/16/22 0815   heparin injection 5,000 Units  5,000 Units Subcutaneous Q8H Adefeso, Oladapo, DO         ALLERGIES Ibuprofen  MEDICAL HISTORY Past Medical History:  Diagnosis Date   CKD (chronic kidney disease)    Hypercholesteremia    Hypertension    Noncompliance with medication regimen      SOCIAL HISTORY Social History   Socioeconomic History   Marital status: Married    Spouse name: Not on file   Number of children: 2   Years of education: Not on file   Highest education level: Not on file  Occupational History   Not on file  Tobacco Use   Smoking status: Never   Smokeless tobacco: Never  Substance and Sexual Activity   Alcohol use: No   Drug use: No   Sexual activity: Yes  Other Topics Concern   Not on file  Social History Narrative   Not on file   Social Determinants of Health   Financial Resource Strain: Not on file  Food Insecurity: Not on file  Transportation Needs: Not on file   Physical Activity: Not on file  Stress: Not on file  Social Connections: Not on file  Intimate Partner Violence: Not on file     FAMILY HISTORY Family History  Problem Relation Age of Onset   Liver cancer Neg Hx    Colon cancer Neg Hx       Review of Systems: 12 systems reviewed Otherwise as per HPI, all other systems reviewed and negative  Physical Exam: Vitals:   02/15/22 2126 02/16/22 0441  BP: (!) 155/83 119/84  Pulse: 86 82  Resp: 19 17  Temp: 98 F (36.7 C) 98.4 F (36.9 C)  SpO2: 100% 98%   Total I/O In: 240 [P.O.:240] Out: 350 [Other:350]  Intake/Output Summary (Last 24 hours) at 02/16/2022 1152 Last data filed at 02/16/2022 1100 Gross per 24 hour  Intake 720 ml  Output 550 ml  Net 170 ml   General: well-appearing, no acute distress HEENT: anicteric sclera, oropharynx clear without lesions CV: regular rate, normal rhythm, no murmurs, no gallops, no rubs, no peripheral edema Lungs: clear to auscultation bilaterally, normal work of breathing Abd: soft, non-tender, non-distended Skin: no visible lesions or rashes Psych: alert, engaged, appropriate mood and affect Musculoskeletal: no obvious deformities Neuro: normal speech, no gross focal deficits   Test Results Reviewed Lab Results  Component Value Date   NA 144 02/16/2022   K 4.2 02/16/2022   CL 115 (H) 02/16/2022   CO2 21 (L) 02/16/2022   BUN 54 (H) 02/16/2022   CREATININE 3.60 (H) 02/16/2022   GFR 11.82 (LL) 02/14/2022   CALCIUM 9.0 02/16/2022   ALBUMIN 3.1 (L) 02/16/2022   PHOS 4.5 02/16/2022    CBC Recent Labs  Lab 02/14/22 0919 02/15/22 0102 02/15/22 0619 02/16/22 0523  WBC 9.6  --  11.7* 11.1*  HGB 11.1* 10.9* 10.4* 9.6*  HCT 33.9* 32.0* 33.0* 30.6*  MCV 85.6  --  90.2 91.6  PLT 287.0  --  275 249    I have reviewed all relevant outside healthcare records related to the patient's current hospitalization

## 2022-02-16 NOTE — Care Management Important Message (Signed)
Important Message  Patient Details  Name: Patricia Wu MRN: [id number] Date of Birth: 06/23/1956   Medicare Important Message Given:  Yes     [name] 02/16/2022, 11:50 AM

## 2022-02-28 ENCOUNTER — Ambulatory Visit (INDEPENDENT_AMBULATORY_CARE_PROVIDER_SITE_OTHER): Payer: No Typology Code available for payment source | Admitting: Family Medicine

## 2022-02-28 ENCOUNTER — Encounter: Payer: Self-pay | Admitting: Family Medicine

## 2022-02-28 VITALS — BP 144/76 | HR 82 | Temp 97.7°F | Resp 17 | Ht 63.0 in | Wt 210.0 lb

## 2022-02-28 DIAGNOSIS — N179 Acute kidney failure, unspecified: Secondary | ICD-10-CM | POA: Diagnosis not present

## 2022-02-28 DIAGNOSIS — I10 Essential (primary) hypertension: Secondary | ICD-10-CM | POA: Diagnosis not present

## 2022-02-28 DIAGNOSIS — N183 Chronic kidney disease, stage 3 unspecified: Secondary | ICD-10-CM

## 2022-02-28 DIAGNOSIS — D649 Anemia, unspecified: Secondary | ICD-10-CM

## 2022-02-28 LAB — BASIC METABOLIC PANEL
BUN: 34 mg/dL — ABNORMAL HIGH (ref 6–23)
CO2: 21 mEq/L (ref 19–32)
Calcium: 9.3 mg/dL (ref 8.4–10.5)
Chloride: 112 mEq/L (ref 96–112)
Creatinine, Ser: 3.13 mg/dL — ABNORMAL HIGH (ref 0.40–1.20)
GFR: 15.05 mL/min — ABNORMAL LOW (ref >60.00)
Glucose, Bld: 88 mg/dL (ref 70–99)
Potassium: 5 mEq/L (ref 3.5–5.1)
Sodium: 143 mEq/L (ref 135–145)

## 2022-02-28 LAB — CBC
HCT: 33.7 % — ABNORMAL LOW (ref 36.0–46.0)
Hemoglobin: 11 g/dL — ABNORMAL LOW (ref 12.0–15.0)
MCHC: 32.6 g/dL (ref 30.0–36.0)
MCV: 86.3 fl (ref 78.0–100.0)
Platelets: 285 10*3/uL (ref 150.0–400.0)
RBC: 3.9 Mil/uL (ref 3.87–5.11)
RDW: 14.5 % (ref 11.5–15.5)
WBC: 10.6 10*3/uL — ABNORMAL HIGH (ref 4.0–10.5)

## 2022-02-28 NOTE — Progress Notes (Signed)
+ ? ?Subjective:  ?Patient ID: Patricia Wu, female    DOB: Jul 06, 1956  Age: 66 y.o. MRN: [id number] ? ?CC:  ?Chief Complaint  ?Patient presents with  ? Hospitalization Follow-up  ?  Pt here for hospital follow up, pt reports was taken off of lisinopril with HCTZ as they felt this was contributing to kidney issues   ? ? ?HPI ?Patricia Wu presents for  ? ?Hospital follow-up ?Acute renal failure on chronic kidney disease ?Initially noted elevated creatinine at her February 22 visit.  3.83 up from 1.986 months prior.  EGFR 11.8.  Sent to ER. ?Acute kidney on chronic kidney disease stage IIIb of unclear etiology.  Renal ultrasound showed no evidence of hydronephrosis kidney stones or masses.  Medical renal disease noted.  Received IV fluid hydration and nephrology was consulted.  Discontinued home HCTZ and lisinopril, plan for repeat BMP outpatient.  Question of overtreatment of hypertension.  Nephrology, Dr. Joylene Grapes will follow-up patient outpatient. ?Non-anion gap metabolic acidosis improved with bicarb 21 ?Hyperphosphatemia normalized 4.5. ?Continued on amlodipine for hypertension. ?Discharge creatinine 3.60 on 02/16/22.  ?Drinking fluids. Avoiding nsaids. Feeling better since leaving hospital, no n/v or abd pain. Normal urination. Energy level ok.  ?Taking amlodipine 59m per day, atorvastatin 125mqd.  ?Home BP readings - 126/79 few days ago.  ?Variable hemoglobin during hospitalization ranged from 9.6-11.1 with discharge hemoglobin 9.6.  Did receive IV fluid for hydration. ? ?BP Readings from Last 3 Encounters:  ?02/28/22 (!) 144/76  ?02/16/22 (!) 147/70  ?02/14/22 122/70  ? ?Lab Results  ?Component Value Date  ? NA 144 02/16/2022  ? K 4.2 02/16/2022  ? CL 115 (H) 02/16/2022  ? CO2 21 (L) 02/16/2022  ? ?Lab Results  ?Component Value Date  ? CREATININE 3.60 (H) 02/16/2022  ? ? ? ?History ?Patient Active Problem List  ? Diagnosis Date Noted  ? Acute-on-chronic kidney injury ([facility]/23/2023  ? Hyperlipidemia  02/15/2022  ? Obesity (BMI 30-39.9) 02/15/2022  ? Colon cancer screening 09/21/2020  ? Elevated alkaline phosphatase level 09/21/2020  ? Essential hypertension 02/18/2018  ? Elevated brain natriuretic peptide (BNP) level 02/18/2018  ? CKD (chronic kidney disease) stage 3, GFR 30-59 ml/min (HCC) 02/18/2018  ? ?Past Medical History:  ?Diagnosis Date  ? CKD (chronic kidney disease)   ? Hypercholesteremia   ? Hypertension   ? Noncompliance with medication regimen   ? ?Past Surgical History:  ?Procedure Laterality Date  ? COLONOSCOPY WITH PROPOFOL N/A 10/17/2020  ? Procedure: COLONOSCOPY WITH PROPOFOL;  Surgeon: CaEloise HarmanDO;  Location: [facility];  Service: Endoscopy;  Laterality: N/A;  1:00pm  ? POLYPECTOMY  10/17/2020  ? Procedure: POLYPECTOMY;  Surgeon: CaEloise HarmanDO;  Location: [facility];  Service: Endoscopy;;  ? ?Allergies  ?Allergen Reactions  ? Ibuprofen Other (See Comments)  ?  pcp said raises her BP  ? ?Prior to Admission medications   ?Medication Sig Start Date End Date Taking? Authorizing Provider  ?acetaminophen (TYLENOL) 325 MG tablet Take 325-650 mg by mouth every 6 (six) hours as needed (pain.).    Yes [provider]  ?amLODipine (NORVASC) 10 MG tablet Take 1 tablet (10 mg total) by mouth daily. 02/14/22  Yes GrWendie AgresteMD  ?atorvastatin (LIPITOR) 10 MG tablet Take 1 tablet (10 mg total) by mouth daily. 02/14/22  Yes GrWendie AgresteMD  ? ?Social History  ? ?Socioeconomic History  ? Marital status: Married  ?  Spouse name: Not on file  ?  Number of children: 2  ? Years of education: Not on file  ? Highest education level: Not on file  ?Occupational History  ? Not on file  ?Tobacco Use  ? Smoking status: Never  ? Smokeless tobacco: Never  ?Substance and Sexual Activity  ? Alcohol use: No  ? Drug use: No  ? Sexual activity: Yes  ?Other Topics Concern  ? Not on file  ?Social History Narrative  ? Not on file  ? ?Social Determinants of Health  ? ?Financial Resource  Strain: Not on file  ?Food Insecurity: Not on file  ?Transportation Needs: Not on file  ?Physical Activity: Not on file  ?Stress: Not on file  ?Social Connections: Not on file  ?Intimate Partner Violence: Not on file  ? ? ?Review of Systems ? ? ?Objective:  ? ?Vitals:  ? 02/28/22 1121  ?BP: (!) 144/76  ?Pulse: 82  ?Resp: 17  ?Temp: 97.7 ?F (36.5 ?C)  ?TempSrc: Temporal  ?SpO2: 99%  ?Weight: 210 lb (95.3 kg)  ?Height: 5' 3"  (1.6 m)  ? ? ? ?Physical Exam ?Vitals reviewed.  ?Constitutional:   ?   Appearance: Normal appearance. She is well-developed.  ?HENT:  ?   Head: Normocephalic and atraumatic.  ?Eyes:  ?   Conjunctiva/sclera: Conjunctivae normal.  ?   Pupils: Pupils are equal, round, and reactive to light.  ?Neck:  ?   Vascular: No carotid bruit.  ?Cardiovascular:  ?   Rate and Rhythm: Normal rate and regular rhythm.  ?   Heart sounds: Normal heart sounds.  ?Pulmonary:  ?   Effort: Pulmonary effort is normal.  ?   Breath sounds: Normal breath sounds.  ?Abdominal:  ?   Palpations: Abdomen is soft. There is no pulsatile mass.  ?   Tenderness: There is no abdominal tenderness.  ?Musculoskeletal:  ?   Right lower leg: No edema.  ?   Left lower leg: No edema.  ?Skin: ?   General: Skin is warm and dry.  ?Neurological:  ?   Mental Status: She is alert and oriented to person, place, and time.  ?Psychiatric:     ?   Mood and Affect: Mood normal.     ?   Behavior: Behavior normal.  ?30 minutes spent during visit, including chart review,hospital record review,, counseling and assimilation of information, exam, discussion of plan, and chart completion.  ? ? ?Assessment & Plan:  ?Patricia Wu is a 66 y.o. female . ?AKI (acute kidney injury) ([place]) - Plan: Basic metabolic panel ? ?Stage 3 chronic kidney disease, unspecified whether stage 3a or 3b CKD ([place]) ? ?Essential hypertension ? ?Anemia, unspecified type - Plan: CBC ? ?Improving since hospitalization.  Blood pressure borderline elevated but given concern of prior  overtreatment with AKI on CKD will avoid additional medication for now, especially with lower home readings.  Continue follow-up with nephrology as planned.  Updated BMP ordered.  Continue to avoid NSAIDs, maintain hydration, ER precautions given. ? ?Updated labs ordered including recheck anemia, decreased during hospitalization may have been volume related with IV fluids. ? ?No orders of the defined types were placed in this encounter. ? ?Patient Instructions  ?Continue to drink plenty of water.  ?Based on the blood pressures at home I think it is reasonable to just take amlodipine right now for blood pressure.  Keep follow-up with nephrology next week as planned.  If you notice home readings above 140/90, let me know. ?I am rechecking the kidney function test today and  we will let you know if there are any concerns.  Take care.  ? ? ? ?Signed,  ? ?Merri Ray, MD ?[place], [facility] ?[place] ?02/28/22 ?12:25 PM ? ? ?

## 2022-02-28 NOTE — Patient Instructions (Addendum)
Continue to drink plenty of water.  ?Based on the blood pressures at home I think it is reasonable to just take amlodipine right now for blood pressure.  Keep follow-up with nephrology next week as planned.  If you notice home readings above 140/90, let me know. ?I am rechecking the kidney function test today and we will let you know if there are any concerns.  Take care.  ?

## 2022-03-05 DIAGNOSIS — N183 Chronic kidney disease, stage 3 unspecified: Secondary | ICD-10-CM | POA: Diagnosis not present

## 2022-03-05 DIAGNOSIS — I129 Hypertensive chronic kidney disease with stage 1 through stage 4 chronic kidney disease, or unspecified chronic kidney disease: Secondary | ICD-10-CM | POA: Diagnosis not present

## 2022-03-05 DIAGNOSIS — N184 Chronic kidney disease, stage 4 (severe): Secondary | ICD-10-CM | POA: Diagnosis not present

## 2022-03-15 ENCOUNTER — Telehealth: Payer: Self-pay

## 2022-03-15 NOTE — Telephone Encounter (Signed)
Called patient x 3 no answer no voice mail , patient may rescheduled for next available  appointment. ? ?L. Markeia Harkless,LPN  ?

## 2022-04-02 ENCOUNTER — Ambulatory Visit: Payer: No Typology Code available for payment source | Admitting: Family Medicine

## 2022-04-04 ENCOUNTER — Ambulatory Visit (INDEPENDENT_AMBULATORY_CARE_PROVIDER_SITE_OTHER): Payer: No Typology Code available for payment source | Admitting: Family Medicine

## 2022-04-04 VITALS — BP 136/76 | HR 79 | Temp 98.1°F | Resp 16 | Ht 63.0 in | Wt 206.4 lb

## 2022-04-04 DIAGNOSIS — N179 Acute kidney failure, unspecified: Secondary | ICD-10-CM

## 2022-04-04 DIAGNOSIS — N183 Chronic kidney disease, stage 3 unspecified: Secondary | ICD-10-CM | POA: Diagnosis not present

## 2022-04-04 DIAGNOSIS — E2839 Other primary ovarian failure: Secondary | ICD-10-CM | POA: Diagnosis not present

## 2022-04-04 DIAGNOSIS — I10 Essential (primary) hypertension: Secondary | ICD-10-CM | POA: Diagnosis not present

## 2022-04-04 DIAGNOSIS — Z13820 Encounter for screening for osteoporosis: Secondary | ICD-10-CM | POA: Diagnosis not present

## 2022-04-04 NOTE — Patient Instructions (Addendum)
If they do not check bloodwork at upcoming kidney appointment, let me know and we can schedule a lab visit here (or you can walk in without an appointment at the [facility] lab). I will pend an order just in case.  ?[facility] ?Walk in 8:30-4:30 during weekdays, no appointment needed ?[address]  ?[place], [place] [id number] ? ?Blood pressure looks good today. Keep up the good work with weight loss! ?Bone density test ordered.  ?

## 2022-04-04 NOTE — Addendum Note (Signed)
Addended by: Patrcia Dolly on: 04/04/2022 02:11 PM ? ? Modules accepted: Orders ? ?

## 2022-04-04 NOTE — Progress Notes (Signed)
? ?Subjective:  ?Patient ID: Patricia Wu, female    DOB: 1956-08-08  Age: 66 y.o. MRN: [id number] ? ?CC:  ?Chief Complaint  ?Patient presents with  ? Hypertension  ?  Pt here for recheck on blood pressure, pt notes no physical sxs, doing well   ? ? ?HPI ?Patricia Wu presents for  ? ?Hypertension: ?Treated with amlodipine 10 mg daily. ?Hospital follow-up March 8 after AKI, peak creatinine 3.83 up from 1.98 6 months prior HCTZ, lisinopril were discontinued.  Question of overtreatment of hypertension.  Plan for outpatient nephrology follow-up.  Discharge creatinine was 3.60 on 02/16/2022, repeat testing last visit lower at 3.13. ?Home readings: 122/79 ?No new side effects with meds.  ?Nephrology appt in 5 days.  ?BP Readings from Last 3 Encounters:  ?04/04/22 136/76  ?02/28/22 (!) 144/76  ?02/16/22 (!) 147/70  ? ?Lab Results  ?Component Value Date  ? CREATININE 3.13 (H) 02/28/2022  ? ?Wt Readings from Last 3 Encounters:  ?04/04/22 206 lb 6.4 oz (93.6 kg)  ?02/28/22 210 lb (95.3 kg)  ?02/15/22 213 lb 1.6 oz (96.7 kg)  ?Walking for exercise, eating better for weight loss.  ? ? ?Hyperlipidemia: ?Lipitor 10 mg daily, no new side effects.  ?Lab Results  ?Component Value Date  ? CHOL 201 (H) 02/14/2022  ? HDL 50.40 02/14/2022  ? LDLCALC 134 (H) 02/14/2022  ? TRIG 84.0 02/14/2022  ? CHOLHDL 4 02/14/2022  ? ?Lab Results  ?Component Value Date  ? ALT 13 02/15/2022  ? AST 14 (L) 02/15/2022  ? GGT 247 (H) 10/11/2020  ? ALKPHOS 211 (H) 02/15/2022  ? BILITOT 0.4 02/15/2022  ? ?Health maintenance ?Bone density testing due, recommended.  ?Shingles vaccine recommended. 2nd vaccine planned at pharmacy.  ? ? ?History ?Patient Active Problem List  ? Diagnosis Date Noted  ? Acute-on-chronic kidney injury ([place]) 02/15/2022  ? Hyperlipidemia 02/15/2022  ? Obesity (BMI 30-39.9) 02/15/2022  ? Colon cancer screening 09/21/2020  ? Elevated alkaline phosphatase level 09/21/2020  ? Essential hypertension 02/18/2018  ? Elevated brain  natriuretic peptide (BNP) level 02/18/2018  ? CKD (chronic kidney disease) stage 3, GFR 30-59 ml/min (HCC) 02/18/2018  ? ?Past Medical History:  ?Diagnosis Date  ? CKD (chronic kidney disease)   ? Hypercholesteremia   ? Hypertension   ? Noncompliance with medication regimen   ? ?Past Surgical History:  ?Procedure Laterality Date  ? COLONOSCOPY WITH PROPOFOL N/A 10/17/2020  ? Procedure: COLONOSCOPY WITH PROPOFOL;  Surgeon: Eloise Harman, DO;  [facility];  Service: Endoscopy;  Laterality: N/A;  1:00pm  ? POLYPECTOMY  10/17/2020  ? Procedure: POLYPECTOMY;  Surgeon: Eloise Harman, DO;  [facility];  Service: Endoscopy;;  ? ?Allergies  ?Allergen Reactions  ? Ibuprofen Other (See Comments)  ?  pcp said raises her BP  ? ?Prior to Admission medications   ?Medication Sig Start Date End Date Taking? Authorizing Provider  ?acetaminophen (TYLENOL) 325 MG tablet Take 325-650 mg by mouth every 6 (six) hours as needed (pain.).    Yes [provider]  ?amLODipine (NORVASC) 10 MG tablet Take 1 tablet (10 mg total) by mouth daily. 02/14/22  Yes Wendie Agreste, MD  ?atorvastatin (LIPITOR) 10 MG tablet Take 1 tablet (10 mg total) by mouth daily. 02/14/22  Yes Wendie Agreste, MD  ? ?Social History  ? ?Socioeconomic History  ? Marital status: Married  ?  Spouse name: Not on file  ? Number of children: 2  ? Years  of education: Not on file  ? Highest education level: Not on file  ?Occupational History  ? Not on file  ?Tobacco Use  ? Smoking status: Never  ? Smokeless tobacco: Never  ?Substance and Sexual Activity  ? Alcohol use: No  ? Drug use: No  ? Sexual activity: Yes  ?Other Topics Concern  ? Not on file  ?Social History Narrative  ? Not on file  ? ?Social Determinants of Health  ? ?Financial Resource Strain: Not on file  ?Food Insecurity: Not on file  ?Transportation Needs: Not on file  ?Physical Activity: Not on file  ?Stress: Not on file  ?Social Connections: Not on file  ?Intimate  Partner Violence: Not on file  ? ? ?Review of Systems  ?Constitutional:  Negative for fatigue and unexpected weight change.  ?Respiratory:  Negative for chest tightness and shortness of breath.   ?Cardiovascular:  Negative for chest pain, palpitations and leg swelling.  ?Gastrointestinal:  Negative for abdominal pain and blood in stool.  ?Neurological:  Negative for dizziness, syncope, light-headedness and headaches.  ? ? ?Objective:  ? ?Vitals:  ? 04/04/22 1059  ?BP: 136/76  ?Pulse: 79  ?Resp: 16  ?Temp: 98.1 ?F (36.7 ?C)  ?TempSrc: Temporal  ?SpO2: 99%  ?Weight: 206 lb 6.4 oz (93.6 kg)  ?Height: '5\' 3"'$  (1.6 m)  ? ? ? ?Physical Exam ?Vitals reviewed.  ?Constitutional:   ?   Appearance: Normal appearance. She is well-developed.  ?HENT:  ?   Head: Normocephalic and atraumatic.  ?Eyes:  ?   Conjunctiva/sclera: Conjunctivae normal.  ?   Pupils: Pupils are equal, round, and reactive to light.  ?Neck:  ?   Vascular: No carotid bruit.  ?Cardiovascular:  ?   Rate and Rhythm: Normal rate and regular rhythm.  ?   Heart sounds: Normal heart sounds.  ?Pulmonary:  ?   Effort: Pulmonary effort is normal.  ?   Breath sounds: Normal breath sounds.  ?Abdominal:  ?   Palpations: Abdomen is soft. There is no pulsatile mass.  ?   Tenderness: There is no abdominal tenderness.  ?Musculoskeletal:  ?   Right lower leg: No edema.  ?   Left lower leg: No edema.  ?Skin: ?   General: Skin is warm and dry.  ?Neurological:  ?   Mental Status: She is alert and oriented to person, place, and time.  ?Psychiatric:     ?   Mood and Affect: Mood normal.     ?   Behavior: Behavior normal.  ? ? ? ? ? ?Assessment & Plan:  ?Patricia Wu is a 66 y.o. female . ?Essential hypertension - Plan: Basic metabolic panel ? -Stable with current regimen of amlodipine.  Continue same dose with labs as below. ? ?AKI (acute kidney injury) ([place]) - Plan: Basic metabolic panel ?Stage 3 chronic kidney disease, unspecified whether stage 3a or 3b CKD ([place]) - Plan: Basic  metabolic panel ? -Follow-up planned with nephrology as above.  Will defer lab work to that visit but if they do not check labs I did pend a BMP for a future order, lab only visit here or at [facility] lab ? ?Screening for osteoporosis - Plan: DG Bone Density ?Estrogen deficiency - Plan: DG Bone Density ? ? ?No orders of the defined types were placed in this encounter. ? ?Patient Instructions  ?If they do not check bloodwork at upcoming kidney appointment, let me know and we can schedule a lab visit here (or you can  walk in without an appointment at the [facility] lab). I will pend an order just in case.  ?[facility] ?Walk in 8:30-4:30 during weekdays, no appointment needed ?[address]  ?[place], [place] [id number] ? ?Blood pressure looks good today. Keep up the good work with weight loss! ?Bone density test ordered.  ? ? ? ?Signed,  ? ?Merri Ray, MD ?[place], [facility] ?[place] ?04/04/22 ?12:05 PM ? ? ?

## 2022-04-09 ENCOUNTER — Telehealth: Payer: Self-pay

## 2022-04-09 DIAGNOSIS — N390 Urinary tract infection, site not specified: Secondary | ICD-10-CM | POA: Diagnosis not present

## 2022-04-09 DIAGNOSIS — N184 Chronic kidney disease, stage 4 (severe): Secondary | ICD-10-CM | POA: Diagnosis not present

## 2022-04-09 NOTE — Telephone Encounter (Signed)
Patient wanted to inform Dr.Greene that she ended up having her blood work done at [facility] today.  ?

## 2022-04-10 NOTE — Telephone Encounter (Signed)
Noted.  I will watch for those results.  Thanks ?

## 2022-04-16 DIAGNOSIS — D631 Anemia in chronic kidney disease: Secondary | ICD-10-CM | POA: Diagnosis not present

## 2022-04-16 DIAGNOSIS — N184 Chronic kidney disease, stage 4 (severe): Secondary | ICD-10-CM | POA: Diagnosis not present

## 2022-04-16 DIAGNOSIS — R809 Proteinuria, unspecified: Secondary | ICD-10-CM | POA: Diagnosis not present

## 2022-04-16 DIAGNOSIS — N179 Acute kidney failure, unspecified: Secondary | ICD-10-CM | POA: Diagnosis not present

## 2022-04-16 DIAGNOSIS — I129 Hypertensive chronic kidney disease with stage 1 through stage 4 chronic kidney disease, or unspecified chronic kidney disease: Secondary | ICD-10-CM | POA: Diagnosis not present

## 2022-06-18 DIAGNOSIS — N184 Chronic kidney disease, stage 4 (severe): Secondary | ICD-10-CM | POA: Diagnosis not present

## 2022-06-18 DIAGNOSIS — D631 Anemia in chronic kidney disease: Secondary | ICD-10-CM | POA: Diagnosis not present

## 2022-06-18 DIAGNOSIS — N2581 Secondary hyperparathyroidism of renal origin: Secondary | ICD-10-CM | POA: Diagnosis not present

## 2022-06-18 DIAGNOSIS — N179 Acute kidney failure, unspecified: Secondary | ICD-10-CM | POA: Diagnosis not present

## 2022-06-18 DIAGNOSIS — R809 Proteinuria, unspecified: Secondary | ICD-10-CM | POA: Diagnosis not present

## 2022-06-18 DIAGNOSIS — I129 Hypertensive chronic kidney disease with stage 1 through stage 4 chronic kidney disease, or unspecified chronic kidney disease: Secondary | ICD-10-CM | POA: Diagnosis not present

## 2022-07-11 ENCOUNTER — Ambulatory Visit (INDEPENDENT_AMBULATORY_CARE_PROVIDER_SITE_OTHER): Payer: No Typology Code available for payment source | Admitting: Family Medicine

## 2022-07-11 VITALS — BP 136/78 | HR 89 | Temp 98.0°F | Resp 18 | Ht 63.0 in | Wt 198.8 lb

## 2022-07-11 DIAGNOSIS — E785 Hyperlipidemia, unspecified: Secondary | ICD-10-CM

## 2022-07-11 DIAGNOSIS — I10 Essential (primary) hypertension: Secondary | ICD-10-CM | POA: Diagnosis not present

## 2022-07-11 DIAGNOSIS — R609 Edema, unspecified: Secondary | ICD-10-CM

## 2022-07-11 LAB — COMPREHENSIVE METABOLIC PANEL
ALT: 9 U/L (ref 0–35)
AST: 13 U/L (ref 0–37)
Albumin: 4.3 g/dL (ref 3.5–5.2)
Alkaline Phosphatase: 302 U/L — ABNORMAL HIGH (ref 39–117)
BUN: 42 mg/dL — ABNORMAL HIGH (ref 6–23)
CO2: 22 mEq/L (ref 19–32)
Calcium: 9.6 mg/dL (ref 8.4–10.5)
Chloride: 111 mEq/L (ref 96–112)
Creatinine, Ser: 3.25 mg/dL — ABNORMAL HIGH (ref 0.40–1.20)
GFR: 14.35 mL/min — CL (ref >60.00)
Glucose, Bld: 91 mg/dL (ref 70–99)
Potassium: 4.5 mEq/L (ref 3.5–5.1)
Sodium: 144 mEq/L (ref 135–145)
Total Bilirubin: 0.5 mg/dL (ref 0.2–1.2)
Total Protein: 8.1 g/dL (ref 6.0–8.3)

## 2022-07-11 LAB — LIPID PANEL
Cholesterol: 181 mg/dL (ref 0–200)
HDL: 50.7 mg/dL (ref >39.00)
LDL Cholesterol: 112 mg/dL — ABNORMAL HIGH (ref 0–99)
NonHDL: 130.02
Total CHOL/HDL Ratio: 4
Triglycerides: 88 mg/dL (ref 0.0–149.0)
VLDL: 17.6 mg/dL (ref 0.0–40.0)

## 2022-07-11 MED ORDER — AMLODIPINE BESYLATE 10 MG PO TABS: 10.0000 mg | ORAL_TABLET | Freq: Every day | ORAL | 2 refills | Status: DC

## 2022-07-11 MED ORDER — ATORVASTATIN CALCIUM 10 MG PO TABS: 10.0000 mg | ORAL_TABLET | Freq: Every day | ORAL | 2 refills | Status: DC

## 2022-07-11 MED ORDER — FUROSEMIDE 20 MG PO TABS: 10.0000 mg | ORAL_TABLET | Freq: Every day | ORAL | 1 refills | Status: DC | PRN

## 2022-07-11 NOTE — Progress Notes (Signed)
Subjective:  Patient ID: Patricia Wu, female    DOB: [birth year]  Age: 66 y.o. MRN: [id number]  CC:  Chief Complaint  Patient presents with   Hyperlipidemia   Hypertension    HPI Patricia Wu presents for  Hypertension: Amlodipine 10 mg daily.  Complicated by CKD, progressed to stage IV.  Seen by nephrology, Dr. Moshe Cipro. Appt on 8/28 to discuss options.  Home readings:130/70 Holding Ace-I and ARB with renal disease. Considering restart of ACE-I once creatinine stabilizes.  Some leg swelling.  Rare added salt, no fast or frozen food typically.  BP Readings from Last 3 Encounters:  07/11/22 136/78  04/04/22 136/76  02/28/22 (!) 144/76   Lab Results  Component Value Date   CREATININE 3.13 (H) 02/28/2022   Hyperlipidemia: Lipitor 10 mg daily. No new myalgias/side effects.  Lab Results  Component Value Date   CHOL 201 (H) 02/14/2022   HDL 50.40 02/14/2022   LDLCALC 134 (H) 02/14/2022   TRIG 84.0 02/14/2022   CHOLHDL 4 02/14/2022   Lab Results  Component Value Date   ALT 13 02/15/2022   AST 14 (L) 02/15/2022   GGT 247 (H) 10/11/2020   ALKPHOS 211 (H) 02/15/2022   BILITOT 0.4 02/15/2022    HM: Bone density ordered in April. Has not received call.    History Patient Active Problem List   Diagnosis Date Noted   Acute-on-chronic kidney injury ([place]) 02/15/2022   Hyperlipidemia 02/15/2022   Obesity (BMI 30-39.9) 02/15/2022   Colon cancer screening 09/21/2020   Elevated alkaline phosphatase level 09/21/2020   Essential hypertension 02/18/2018   Elevated brain natriuretic peptide (BNP) level 02/18/2018   CKD (chronic kidney disease) stage 3, GFR 30-59 ml/min (HCC) 02/18/2018   Past Medical History:  Diagnosis Date   CKD (chronic kidney disease)    Hypercholesteremia    Hypertension    Noncompliance with medication regimen    Past Surgical History:  Procedure Laterality Date   COLONOSCOPY WITH PROPOFOL N/A 10/17/2020   Procedure: COLONOSCOPY  WITH PROPOFOL;  Surgeon: Eloise Harman, DO;  [facility];  Service: Endoscopy;  Laterality: N/A;  1:00pm   POLYPECTOMY  10/17/2020   Procedure: POLYPECTOMY;  Surgeon: Eloise Harman, DO;  [facility];  Service: Endoscopy;;   Allergies  Allergen Reactions   Ibuprofen Other (See Comments)    pcp said raises her BP   Prior to Admission medications   Medication Sig Start Date End Date Taking? Authorizing Provider  acetaminophen (TYLENOL) 325 MG tablet Take 325-650 mg by mouth every 6 (six) hours as needed (pain.).    Yes [provider]  amLODipine (NORVASC) 10 MG tablet Take 1 tablet (10 mg total) by mouth daily. 02/14/22  Yes Wendie Agreste, MD  atorvastatin (LIPITOR) 10 MG tablet Take 1 tablet (10 mg total) by mouth daily. 02/14/22  Yes Wendie Agreste, MD  Cholecalciferol (VITAMIN D) 125 MCG (5000 UT) CAPS Take by mouth.   Yes [provider]   Social History   Socioeconomic History   Marital status: Married    Spouse name: Not on file   Number of children: 2   Years of education: Not on file   Highest education level: Not on file  Occupational History   Not on file  Tobacco Use   Smoking status: Never   Smokeless tobacco: Never  Substance and Sexual Activity   Alcohol use: No   Drug use: No   Sexual activity: Yes  Other Topics Concern   Not on file  Social History Narrative   Not on file   Social Determinants of Health   Financial Resource Strain: Not on file  Food Insecurity: Not on file  Transportation Needs: Not on file  Physical Activity: Not on file  Stress: Not on file  Social Connections: Not on file  Intimate Partner Violence: Not on file    Review of Systems  Constitutional:  Negative for fatigue and unexpected weight change.  Respiratory:  Negative for chest tightness and shortness of breath.   Cardiovascular:  Positive for leg swelling (off and on - worse with standing, improves some overnight.).  Negative for chest pain and palpitations.  Gastrointestinal:  Negative for abdominal pain and blood in stool.  Neurological:  Negative for dizziness, syncope, light-headedness and headaches.     Objective:   Vitals:   07/11/22 1030  BP: 136/78  Pulse: 89  Resp: 18  Temp: 98 F (36.7 C)  TempSrc: Oral  SpO2: 99%  Weight: 198 lb 12.8 oz (90.2 kg)  Height: '5\' 3"'$  (1.6 m)     Physical Exam Vitals reviewed.  Constitutional:      Appearance: Normal appearance. She is well-developed.  HENT:     Head: Normocephalic and atraumatic.  Eyes:     Conjunctiva/sclera: Conjunctivae normal.     Pupils: Pupils are equal, round, and reactive to light.  Neck:     Vascular: No carotid bruit.  Cardiovascular:     Rate and Rhythm: Normal rate and regular rhythm.     Heart sounds: Murmur (2/6 SEM) heard.  Pulmonary:     Effort: Pulmonary effort is normal.     Breath sounds: Normal breath sounds. No stridor. No wheezing, rhonchi or rales.  Abdominal:     Palpations: Abdomen is soft. There is no pulsatile mass.     Tenderness: There is no abdominal tenderness.  Musculoskeletal:     Right lower leg: Edema (1+ bilateral to mid tibia, no wounds or skin changes.) present.     Left lower leg: Edema present.  Skin:    General: Skin is warm and dry.  Neurological:     Mental Status: She is alert and oriented to person, place, and time.  Psychiatric:        Mood and Affect: Mood normal.        Behavior: Behavior normal.        Assessment & Plan:  Patricia Wu is a 66 y.o. female . Peripheral edema  -New concern today.  Likely related to renal disease but also possible dietary component without insult.  Handout given on salty 6 and avoidance of added salt discussed.  Option of low-dose furosemide for few days if needed with potassium rich foods.  Continue follow-up as planned with nephrology.  RTC precautions if worsens  Hyperlipidemia, unspecified hyperlipidemia type - Plan:  atorvastatin (LIPITOR) 10 MG tablet, Lipid panel  -  Stable, tolerating current regimen. Medications refilled. Labs pending as above.   Essential hypertension - Plan: amLODipine (NORVASC) 10 MG tablet, Comprehensive metabolic panel  -  Stable, tolerating current regimen. Medications refilled. Labs pending as above.  Meds ordered this encounter  Medications   atorvastatin (LIPITOR) 10 MG tablet    Sig: Take 1 tablet (10 mg total) by mouth daily.    Dispense:  90 tablet    Refill:  2   amLODipine (NORVASC) 10 MG tablet    Sig: Take 1 tablet (10 mg total) by mouth  daily.    Dispense:  90 tablet    Refill:  2   furosemide (LASIX) 20 MG tablet    Sig: Take 0.5 tablets (10 mg total) by mouth daily as needed. For 2-3 days of needed for leg swelling.    Dispense:  15 tablet    Refill:  1   Patient Instructions  Continue same dose of cholesterol blood pressure medicine for now.  See information below on leg swelling.  Try to avoid high salt foods and look at the foods that I gave you on the handout today which tend to carry a lot of sodium.  If you have continued leg swelling you can use half of the furosemide pill per day for a few days, but make sure you are eating potassium rich foods when taking that medication.  Examples would be sweet potatoes, bananas.  You can find other information online about high potassium foods.  If leg swelling worsens I want you to be seen.  Otherwise follow-up with me in 6 months and keep follow-up with kidney specialist as planned  Peripheral Edema  Peripheral edema is swelling that is caused by a buildup of fluid. Peripheral edema most often affects the lower legs, ankles, and feet. It can also develop in the arms, hands, and face. The area of the body that has peripheral edema will look swollen. It may also feel heavy or warm. Your clothes may start to feel tight. Pressing on the area may make a temporary dent in your skin (pitting edema). You may not be able to  move your swollen arm or leg as much as usual. There are many causes of peripheral edema. It can happen because of a complication of other conditions such as heart failure, kidney disease, or a problem with your circulation. It also can be a side effect of certain medicines or happen because of an infection. It often happens to women during pregnancy. Sometimes, the cause is not known. Follow these instructions at home: Managing pain, stiffness, and swelling  Raise (elevate) your legs while you are sitting or lying down. Move around often to prevent stiffness and to reduce swelling. Do not sit or stand for long periods of time. Do not wear tight clothing. Do not wear garters on your upper legs. Exercise your legs to get your circulation going. This helps to move the fluid back into your blood vessels, and it may help the swelling go down. Wear compression stockings as told by your health care provider. These stockings help to prevent blood clots and reduce swelling in your legs. It is important that these are the correct size. These stockings should be prescribed by your doctor to prevent possible injuries. If elastic bandages or wraps are recommended, use them as told by your health care provider. Medicines Take over-the-counter and prescription medicines only as told by your health care provider. Your health care provider may prescribe medicine to help your body get rid of excess water (diuretic). Take this medicine if you are told to take it. General instructions Eat a low-salt (low-sodium) diet as told by your health care provider. Sometimes, eating less salt may reduce swelling. Pay attention to any changes in your symptoms. Moisturize your skin daily to help prevent skin from cracking and draining. Keep all follow-up visits. This is important. Contact a health care provider if: You have a fever. You have swelling in only one leg. You have increased swelling, redness, or pain in one or  both of your  legs. You have drainage or sores at the area where you have edema. Get help right away if: You have edema that starts suddenly or is getting worse, especially if you are pregnant or have a medical condition. You develop shortness of breath, especially when you are lying down. You have pain in your chest or abdomen. You feel weak. You feel like you will faint. These symptoms may be an emergency. Get help right away. Call 911. Do not wait to see if the symptoms will go away. Do not drive yourself to the hospital. Summary Peripheral edema is swelling that is caused by a buildup of fluid. Peripheral edema most often affects the lower legs, ankles, and feet. Move around often to prevent stiffness and to reduce swelling. Do not sit or stand for long periods of time. Pay attention to any changes in your symptoms. Contact a health care provider if you have edema that starts suddenly or is getting worse, especially if you are pregnant or have a medical condition. Get help right away if you develop shortness of breath, especially when lying down. This information is not intended to replace advice given to you by your health care provider. Make sure you discuss any questions you have with your health care provider. Document Revised: 08/14/2021 Document Reviewed: 08/14/2021 Elsevier Patient Education  [place],   Merri Ray, MD [place], [facility] 07/11/22 11:26 AM

## 2022-07-11 NOTE — Patient Instructions (Addendum)
Continue same dose of cholesterol blood pressure medicine for now.  See information below on leg swelling.  Try to avoid high salt foods and look at the foods that I gave you on the handout today which tend to carry a lot of sodium.  If you have continued leg swelling you can use half of the furosemide pill per day for a few days, but make sure you are eating potassium rich foods when taking that medication.  Examples would be sweet potatoes, bananas.  You can find other information online about high potassium foods.  If leg swelling worsens I want you to be seen.  Otherwise follow-up with me in 6 months and keep follow-up with kidney specialist as planned  Peripheral Edema  Peripheral edema is swelling that is caused by a buildup of fluid. Peripheral edema most often affects the lower legs, ankles, and feet. It can also develop in the arms, hands, and face. The area of the body that has peripheral edema will look swollen. It may also feel heavy or warm. Your clothes may start to feel tight. Pressing on the area may make a temporary dent in your skin (pitting edema). You may not be able to move your swollen arm or leg as much as usual. There are many causes of peripheral edema. It can happen because of a complication of other conditions such as heart failure, kidney disease, or a problem with your circulation. It also can be a side effect of certain medicines or happen because of an infection. It often happens to women during pregnancy. Sometimes, the cause is not known. Follow these instructions at home: Managing pain, stiffness, and swelling  Raise (elevate) your legs while you are sitting or lying down. Move around often to prevent stiffness and to reduce swelling. Do not sit or stand for long periods of time. Do not wear tight clothing. Do not wear garters on your upper legs. Exercise your legs to get your circulation going. This helps to move the fluid back into your blood vessels, and it may help  the swelling go down. Wear compression stockings as told by your health care provider. These stockings help to prevent blood clots and reduce swelling in your legs. It is important that these are the correct size. These stockings should be prescribed by your doctor to prevent possible injuries. If elastic bandages or wraps are recommended, use them as told by your health care provider. Medicines Take over-the-counter and prescription medicines only as told by your health care provider. Your health care provider may prescribe medicine to help your body get rid of excess water (diuretic). Take this medicine if you are told to take it. General instructions Eat a low-salt (low-sodium) diet as told by your health care provider. Sometimes, eating less salt may reduce swelling. Pay attention to any changes in your symptoms. Moisturize your skin daily to help prevent skin from cracking and draining. Keep all follow-up visits. This is important. Contact a health care provider if: You have a fever. You have swelling in only one leg. You have increased swelling, redness, or pain in one or both of your legs. You have drainage or sores at the area where you have edema. Get help right away if: You have edema that starts suddenly or is getting worse, especially if you are pregnant or have a medical condition. You develop shortness of breath, especially when you are lying down. You have pain in your chest or abdomen. You feel weak. You feel like  you will faint. These symptoms may be an emergency. Get help right away. Call 911. Do not wait to see if the symptoms will go away. Do not drive yourself to the hospital. Summary Peripheral edema is swelling that is caused by a buildup of fluid. Peripheral edema most often affects the lower legs, ankles, and feet. Move around often to prevent stiffness and to reduce swelling. Do not sit or stand for long periods of time. Pay attention to any changes in your  symptoms. Contact a health care provider if you have edema that starts suddenly or is getting worse, especially if you are pregnant or have a medical condition. Get help right away if you develop shortness of breath, especially when lying down. This information is not intended to replace advice given to you by your health care provider. Make sure you discuss any questions you have with your health care provider. Document Revised: 08/14/2021 Document Reviewed: 08/14/2021 Elsevier Patient Education  [place].

## 2022-08-15 ENCOUNTER — Ambulatory Visit: Payer: No Typology Code available for payment source | Admitting: Family Medicine

## 2022-08-20 DIAGNOSIS — R809 Proteinuria, unspecified: Secondary | ICD-10-CM | POA: Diagnosis not present

## 2022-08-20 DIAGNOSIS — N179 Acute kidney failure, unspecified: Secondary | ICD-10-CM | POA: Diagnosis not present

## 2022-08-20 DIAGNOSIS — D631 Anemia in chronic kidney disease: Secondary | ICD-10-CM | POA: Diagnosis not present

## 2022-08-20 DIAGNOSIS — N184 Chronic kidney disease, stage 4 (severe): Secondary | ICD-10-CM | POA: Diagnosis not present

## 2022-08-20 DIAGNOSIS — N189 Chronic kidney disease, unspecified: Secondary | ICD-10-CM | POA: Diagnosis not present

## 2022-08-20 DIAGNOSIS — N2581 Secondary hyperparathyroidism of renal origin: Secondary | ICD-10-CM | POA: Diagnosis not present

## 2022-08-20 DIAGNOSIS — I129 Hypertensive chronic kidney disease with stage 1 through stage 4 chronic kidney disease, or unspecified chronic kidney disease: Secondary | ICD-10-CM | POA: Diagnosis not present

## 2022-08-22 DIAGNOSIS — R809 Proteinuria, unspecified: Secondary | ICD-10-CM | POA: Diagnosis not present

## 2022-08-30 ENCOUNTER — Encounter (HOSPITAL_COMMUNITY): Payer: No Typology Code available for payment source

## 2022-09-03 ENCOUNTER — Other Ambulatory Visit (HOSPITAL_COMMUNITY): Payer: Self-pay | Admitting: *Deleted

## 2022-09-05 ENCOUNTER — Encounter (HOSPITAL_COMMUNITY)
Admission: RE | Admit: 2022-09-05 | Discharge: 2022-09-05 | Disposition: A | Discharge status: Home / Self Care | Payer: No Typology Code available for payment source | Source: Ambulatory Visit | Attending: Nephrology | Admitting: Nephrology

## 2022-09-05 DIAGNOSIS — D631 Anemia in chronic kidney disease: Secondary | ICD-10-CM | POA: Insufficient documentation

## 2022-09-05 DIAGNOSIS — N189 Chronic kidney disease, unspecified: Secondary | ICD-10-CM | POA: Insufficient documentation

## 2022-09-05 MED ORDER — SODIUM CHLORIDE 0.9 % IV SOLN
510.0000 mg | INTRAVENOUS | Status: DC
  Administered 2022-09-05: 510 mg via INTRAVENOUS
  Filled 2022-09-05: qty 510

## 2022-09-12 ENCOUNTER — Encounter (HOSPITAL_COMMUNITY)
Admission: RE | Admit: 2022-09-12 | Discharge: 2022-09-12 | Disposition: A | Discharge status: Home / Self Care | Payer: No Typology Code available for payment source | Source: Ambulatory Visit | Attending: Nephrology | Admitting: Nephrology

## 2022-09-12 DIAGNOSIS — N189 Chronic kidney disease, unspecified: Secondary | ICD-10-CM | POA: Diagnosis not present

## 2022-09-12 MED ORDER — SODIUM CHLORIDE 0.9 % IV SOLN
510.0000 mg | INTRAVENOUS | Status: DC
  Administered 2022-09-12: 510 mg via INTRAVENOUS
  Filled 2022-09-12: qty 510

## 2022-09-26 ENCOUNTER — Encounter: Payer: Self-pay | Admitting: Vascular Surgery

## 2022-09-26 ENCOUNTER — Ambulatory Visit (INDEPENDENT_AMBULATORY_CARE_PROVIDER_SITE_OTHER): Payer: No Typology Code available for payment source | Admitting: Vascular Surgery

## 2022-09-26 VITALS — BP 170/83 | HR 74 | Temp 98.1°F | Ht 62.0 in | Wt 195.0 lb

## 2022-09-26 DIAGNOSIS — N184 Chronic kidney disease, stage 4 (severe): Secondary | ICD-10-CM | POA: Diagnosis not present

## 2022-09-26 NOTE — Progress Notes (Signed)
[facility] [place]  Patient name: Patricia Wu MRN: [id number] DOB: 10-30-1956 Sex: female  REASON FOR CONSULT: Discuss access for hemodialysis  HPI: [name] is a 66 y.o. female, who is here today for discussion of access for hemodialysis.  She is stage IV.  She does not have any uremic symptoms.  She is here today with her husband.  She is also being evaluated for potential transplant.  She is right-handed.  She is not on any anticoagulation.  She does not have a pacemaker  Past Medical History:  Diagnosis Date   CKD (chronic kidney disease)    Hypercholesteremia    Hypertension    Noncompliance with medication regimen     Family History  Problem Relation Age of Onset   Liver cancer Neg Hx    Colon cancer Neg Hx     SOCIAL HISTORY: Social History   Socioeconomic History   Marital status: Married    Spouse name: Not on file   Number of children: 2   Years of education: Not on file   Highest education level: Not on file  Occupational History   Not on file  Tobacco Use   Smoking status: Never   Smokeless tobacco: Never  Vaping Use   Vaping Use: Never used  Substance and Sexual Activity   Alcohol use: No   Drug use: No   Sexual activity: Yes  Other Topics Concern   Not on file  Social History Narrative   Not on file   Social Determinants of Health   Financial Resource Strain: Not on file  Food Insecurity: Not on file  Transportation Needs: Not on file  Physical Activity: Not on file  Stress: Not on file  Social Connections: Not on file  Intimate Partner Violence: Not on file    Allergies  Allergen Reactions   Ibuprofen Other (See Comments)    pcp said raises her BP    Current Outpatient Medications  Medication Sig Dispense Refill   acetaminophen (TYLENOL) 325 MG tablet Take 325-650 mg by mouth every 6 (six) hours as needed (pain.).      amLODipine (NORVASC) 10 MG tablet Take 1  tablet (10 mg total) by mouth daily. 90 tablet 2   atorvastatin (LIPITOR) 10 MG tablet Take 1 tablet (10 mg total) by mouth daily. 90 tablet 2   Cholecalciferol (VITAMIN D) 125 MCG (5000 UT) CAPS Take by mouth.     furosemide (LASIX) 20 MG tablet Take 0.5 tablets (10 mg total) by mouth daily as needed. For 2-3 days of needed for leg swelling. (Patient not taking: Reported on 09/26/2022) 15 tablet 1   No current facility-administered medications for this visit.    REVIEW OF SYSTEMS:  '[X]'$  denotes positive finding, '[ ]'$  denotes negative finding Cardiac  Comments:  Chest pain or chest pressure:    Shortness of breath upon exertion:    Short of breath when lying flat:    Irregular heart rhythm:        Vascular    Pain in calf, thigh, or hip brought on by ambulation:    Pain in feet at night that wakes you up from your sleep:     Blood clot in your veins:    Leg swelling:         Pulmonary    Oxygen at home:    Productive cough:     Wheezing:         Neurologic  Sudden weakness in arms or legs:     Sudden numbness in arms or legs:     Sudden onset of difficulty speaking or slurred speech:    Temporary loss of vision in one eye:     Problems with dizziness:         Gastrointestinal    Blood in stool:     Vomited blood:         Genitourinary    Burning when urinating:     Blood in urine:        Psychiatric    Major depression:         Hematologic    Bleeding problems:    Problems with blood clotting too easily:        Skin    Rashes or ulcers:        Constitutional    Fever or chills:      PHYSICAL EXAM: Vitals:   09/26/22 1100  BP: (!) 170/83  Pulse: 74  Temp: 98.1 F (36.7 C)  SpO2: 100%  Weight: 195 lb (88.5 kg)  Height: '5\' 2"'$  (1.575 m)    GENERAL: The patient is a well-nourished female, in no acute distress. The vital signs are documented above. CARDIOVASCULAR: 2+ radial pulses bilaterally.  Small surface veins bilaterally PULMONARY: There is good air  exchange  MUSCULOSKELETAL: There are no major deformities or cyanosis. NEUROLOGIC: No focal weakness or paresthesias are detected. SKIN: There are no ulcers or rashes noted. PSYCHIATRIC: The patient has a normal affect.  DATA:  I imaged her arm veins bilaterally with SonoSite ultrasound.  She has extremely small cephalic and basilic veins bilaterally  MEDICAL ISSUES: Had a long discussion with the patient and her husband regarding access options.  I discussed tunneled hemodialysis catheter for acute access.  Also discussed AV graft and AV fistula.  She is not a fistula candidate due to extremely small surface veins bilaterally.  That her best first option for hemodialysis would be left arm AV Gore-Tex graft.  Explained that we would defer this until she is approaching need for hemodialysis.  I will follow-up with Dr. Marval Regal regarding timing.  Explained this will be done as an outpatient at [facility].   Rosetta Posner, MD [facility] [phone] Pager [phone]  Note: Portions of this report may have been transcribed using voice recognition software.  Every effort has been made to ensure accuracy; however, inadvertent computerized transcription errors may still be present.

## 2022-10-01 ENCOUNTER — Other Ambulatory Visit: Payer: Self-pay | Admitting: Family Medicine

## 2022-10-01 DIAGNOSIS — E785 Hyperlipidemia, unspecified: Secondary | ICD-10-CM

## 2022-10-22 DIAGNOSIS — N2581 Secondary hyperparathyroidism of renal origin: Secondary | ICD-10-CM | POA: Diagnosis not present

## 2022-10-22 DIAGNOSIS — N184 Chronic kidney disease, stage 4 (severe): Secondary | ICD-10-CM | POA: Diagnosis not present

## 2022-10-22 DIAGNOSIS — N179 Acute kidney failure, unspecified: Secondary | ICD-10-CM | POA: Diagnosis not present

## 2022-10-22 DIAGNOSIS — R809 Proteinuria, unspecified: Secondary | ICD-10-CM | POA: Diagnosis not present

## 2022-10-22 DIAGNOSIS — N189 Chronic kidney disease, unspecified: Secondary | ICD-10-CM | POA: Diagnosis not present

## 2022-10-22 DIAGNOSIS — D631 Anemia in chronic kidney disease: Secondary | ICD-10-CM | POA: Diagnosis not present

## 2022-10-22 DIAGNOSIS — I129 Hypertensive chronic kidney disease with stage 1 through stage 4 chronic kidney disease, or unspecified chronic kidney disease: Secondary | ICD-10-CM | POA: Diagnosis not present

## 2022-10-25 ENCOUNTER — Ambulatory Visit: Payer: No Typology Code available for payment source

## 2022-10-30 ENCOUNTER — Telehealth: Payer: Self-pay | Admitting: Lab

## 2022-10-30 NOTE — Telephone Encounter (Signed)
No note needed 

## 2022-11-14 DIAGNOSIS — I120 Hypertensive chronic kidney disease with stage 5 chronic kidney disease or end stage renal disease: Secondary | ICD-10-CM | POA: Diagnosis not present

## 2022-11-14 DIAGNOSIS — N185 Chronic kidney disease, stage 5: Secondary | ICD-10-CM | POA: Diagnosis not present

## 2022-11-14 DIAGNOSIS — E785 Hyperlipidemia, unspecified: Secondary | ICD-10-CM | POA: Diagnosis not present

## 2022-11-14 DIAGNOSIS — N2581 Secondary hyperparathyroidism of renal origin: Secondary | ICD-10-CM | POA: Diagnosis not present

## 2022-11-14 DIAGNOSIS — Z6834 Body mass index (BMI) 34.0-34.9, adult: Secondary | ICD-10-CM | POA: Diagnosis not present

## 2022-11-14 DIAGNOSIS — E669 Obesity, unspecified: Secondary | ICD-10-CM | POA: Diagnosis not present

## 2022-11-14 DIAGNOSIS — Z008 Encounter for other general examination: Secondary | ICD-10-CM | POA: Diagnosis not present

## 2022-11-14 DIAGNOSIS — I700 Atherosclerosis of aorta: Secondary | ICD-10-CM | POA: Diagnosis not present

## 2022-11-23 DIAGNOSIS — R69 Illness, unspecified: Secondary | ICD-10-CM | POA: Diagnosis not present

## 2022-11-28 ENCOUNTER — Ambulatory Visit (INDEPENDENT_AMBULATORY_CARE_PROVIDER_SITE_OTHER): Payer: No Typology Code available for payment source | Admitting: *Deleted

## 2022-11-28 VITALS — BP 170/84 | HR 76 | Temp 98.0°F | Resp 16 | Ht 62.0 in | Wt 201.2 lb

## 2022-11-28 DIAGNOSIS — Z780 Asymptomatic menopausal state: Secondary | ICD-10-CM | POA: Diagnosis not present

## 2022-11-28 DIAGNOSIS — Z0000 Encounter for general adult medical examination without abnormal findings: Secondary | ICD-10-CM

## 2022-11-28 NOTE — Progress Notes (Signed)
Subjective:   CARIANNE TAIRA is a 66 y.o. female who presents for Medicare Annual (Subsequent) preventive examination.   Patient location: in office  Provider location: in office    Review of Systems           Objective:    There were no vitals filed for this visit. There is no height or weight on file to calculate BMI.     02/15/2022    9:08 AM 02/14/2022    5:38 PM 10/17/2020   12:25 PM 11/09/2015   12:33 PM  Advanced Directives  Does Patient Have a Medical Advance Directive?  No No No  Would patient like information on creating a medical advance directive? No - Patient declined  No - Patient declined     Current Medications (verified) Outpatient Encounter Medications as of 11/28/2022  Medication Sig   acetaminophen (TYLENOL) 325 MG tablet Take 325-650 mg by mouth every 6 (six) hours as needed (pain.).    amLODipine (NORVASC) 10 MG tablet Take 1 tablet (10 mg total) by mouth daily.   atorvastatin (LIPITOR) 10 MG tablet TAKE 1 TABLET(10 MG) BY MOUTH DAILY   Cholecalciferol (VITAMIN D) 125 MCG (5000 UT) CAPS Take by mouth.   furosemide (LASIX) 20 MG tablet Take 0.5 tablets (10 mg total) by mouth daily as needed. For 2-3 days of needed for leg swelling. (Patient not taking: Reported on 09/26/2022)   No facility-administered encounter medications on file as of 11/28/2022.    Allergies (verified) Ibuprofen   History: Past Medical History:  Diagnosis Date   CKD (chronic kidney disease)    Hypercholesteremia    Hypertension    Noncompliance with medication regimen    Past Surgical History:  Procedure Laterality Date   COLONOSCOPY WITH PROPOFOL N/A 10/17/2020   Procedure: COLONOSCOPY WITH PROPOFOL;  Surgeon: Eloise Harman, DO;  [facility];  Service: Endoscopy;  Laterality: N/A;  1:00pm   POLYPECTOMY  10/17/2020   Procedure: POLYPECTOMY;  Surgeon: Eloise Harman, DO;  Location: [facility];  Service: Endoscopy;;   Family History  Problem  Relation Age of Onset   Liver cancer Neg Hx    Colon cancer Neg Hx    Social History   Socioeconomic History   Marital status: Married    Spouse name: Not on file   Number of children: 2   Years of education: Not on file   Highest education level: Not on file  Occupational History   Not on file  Tobacco Use   Smoking status: Never   Smokeless tobacco: Never  Vaping Use   Vaping Use: Never used  Substance and Sexual Activity   Alcohol use: No   Drug use: No   Sexual activity: Yes  Other Topics Concern   Not on file  Social History Narrative   Not on file   Social Determinants of Health   Financial Resource Strain: Not on file  Food Insecurity: Not on file  Transportation Needs: Not on file  Physical Activity: Not on file  Stress: Not on file  Social Connections: Not on file    Tobacco Counseling Counseling given: Not Answered   Clinical Intake:                 Diabetic?  no         Activities of Daily Living    02/15/2022    9:00 AM  In your present state of health, do you have any difficulty performing the  following activities:  Hearing? 0  Vision? 0  Difficulty concentrating or making decisions? 0  Walking or climbing stairs? 0  Dressing or bathing? 0  Doing errands, shopping? 0    Patient Care Team: Wendie Agreste, MD as PCP - General (Family Medicine) Herminio Commons, MD (Inactive) as PCP - Cardiology (Cardiology) Eloise Harman, DO as Consulting Physician (Internal Medicine)  Indicate any recent Medical Services you may have received from other than [facility] providers in the past year (date may be approximate).     Assessment:   This is a routine wellness examination for Makeyla.  Hearing/Vision screen No results found.  Dietary issues and exercise activities discussed:     Goals Addressed   None    Depression Screen    07/11/2022   10:35 AM 04/04/2022   11:01 AM 02/14/2022    8:39 AM 02/08/2021    8:32 AM  08/10/2020    8:36 AM 02/10/2020    8:19 AM 11/25/2019   10:56 AM  PHQ 2/9 Scores  PHQ - 2 Score 0 0 0 0 0 0 0  PHQ- 9 Score  1 2        Fall Risk    07/11/2022   10:35 AM 04/04/2022   11:00 AM 02/14/2022    8:39 AM 08/09/2021    8:18 AM 02/08/2021    8:32 AM  Fall Risk   Falls in the past year? 0 0 0 0 0  Number falls in past yr: 0 0 0 0   Injury with Fall? 0 0 0 0   Risk for fall due to : No Fall Risks No Fall Risks No Fall Risks No Fall Risks   Follow up Falls evaluation completed Falls evaluation completed Falls evaluation completed Falls evaluation completed Falls evaluation completed    FALL RISK PREVENTION PERTAINING TO THE HOME:  Any stairs in or around the home? Yes  If so, are there any without handrails? No  Home free of loose throw rugs in walkways, pet beds, electrical cords, etc? Yes  Adequate lighting in your home to reduce risk of falls? Yes   ASSISTIVE DEVICES UTILIZED TO PREVENT FALLS:  Life alert? No  Use of a cane, walker or w/c? No  Grab bars in the bathroom? No  Shower chair or bench in shower? No  Elevated toilet seat or a handicapped toilet? No   TIMED UP AND GO:  Was the test performed? Yes .  Length of time to ambulate 10 feet: 10 sec.   Gait steady and fast without use of assistive device  Cognitive Function:        Immunizations Immunization History  Administered Date(s) Administered   Influenza,inj,Quad PF,6+ Mos 02/01/2018   PNEUMOCOCCAL CONJUGATE-20 02/14/2022   Tdap 02/01/2018   Zoster Recombinat (Shingrix) 10/26/2021   Zoster, Live 01/07/2017    TDAP status: Up to date  Flu Vaccine status: Due, Education has been provided regarding the importance of this vaccine. Advised may receive this vaccine at local pharmacy or Health Dept. Aware to provide a copy of the vaccination record if obtained from local pharmacy or Health Dept. Verbalized acceptance and understanding.  Pneumococcal vaccine status: Up to date  Covid-19 vaccine  status: Information provided on how to obtain vaccines.   Qualifies for Shingles Vaccine? Yes   Zostavax completed No   Shingrix Completed?: Yes  Screening Tests Health Maintenance  Topic Date Due   Medicare Annual Wellness (AWV)  Never done   DEXA SCAN  Never done   MAMMOGRAM  11/16/2021   Zoster Vaccines- Shingrix (2 of 2) 12/21/2021   COVID-19 Vaccine (2 - 2023-24 season) 08/24/2022   DTaP/Tdap/Td (2 - Td or Tdap) 02/02/2028   COLONOSCOPY (Pts 45-75yr Insurance coverage will need to be confirmed)  10/17/2030   Pneumonia Vaccine 66 Years old  Completed   Hepatitis C Screening  Completed   HPV VACCINES  Aged Out   INFLUENZA VACCINE  Discontinued    Health Maintenance  Health Maintenance Due  Topic Date Due   Medicare Annual Wellness (AWV)  Never done   DEXA SCAN  Never done   MAMMOGRAM  11/16/2021   Zoster Vaccines- Shingrix (2 of 2) 12/21/2021   COVID-19 Vaccine (2 - 2023-24 season) 08/24/2022    Colorectal cancer screening: Type of screening: Colonoscopy. Completed 2021. Repeat every 10 years  Mammogram status: Ordered  . Pt provided with contact info and advised to call to schedule appt.   Bone Density status: Ordered  . Pt provided with contact info and advised to call to schedule appt.  Lung Cancer Screening: (Low Dose CT Chest recommended if Age 66-80years, 30 pack-year currently smoking OR have quit w/in 15years.) does not qualify.   Lung Cancer Screening Referral:   Additional Screening:  Hepatitis C Screening: does not qualify; Completed 2021  Vision Screening: Recommended annual ophthalmology exams for early detection of glaucoma and other disorders of the eye. Is the patient up to date with their annual eye exam?  No  Who is the provider or what is the name of the office in which the patient attends annual eye exams?  If pt is not established with a provider, would they like to be referred to a provider to establish care? No .   Dental Screening:  Recommended annual dental exams for proper oral hygiene  Community Resource Referral / Chronic Care Management: CRR required this visit?  No   CCM required this visit?  No      Plan:     I have personally reviewed and noted the following in the patient's chart:   Medical and social history Use of alcohol, tobacco or illicit drugs  Current medications and supplements including opioid prescriptions. Patient is not currently taking opioid prescriptions. Functional ability and status Nutritional status Physical activity Advanced directives List of other physicians Hospitalizations, surgeries, and ER visits in previous 12 months Vitals Screenings to include cognitive, depression, and falls Referrals and appointments  In addition, I have reviewed and discussed with patient certain preventive protocols, quality metrics, and best practice recommendations. A written personalized care plan for preventive services as well as general preventive health recommendations were provided to patient.     JLeroy Kennedy LPN   [id number]  Nurse Notes:

## 2022-11-28 NOTE — Patient Instructions (Addendum)
Patricia Wu , Thank you for taking time to come for your Medicare Wellness Visit. I appreciate your ongoing commitment to your health goals. Please review the following plan we discussed and let me know if I can assist you in the future.   These are the goals we discussed:  Goals      Patient Stated     Hoping hip will be feeling better         This is a list of the screening recommended for you and due dates:  Health Maintenance  Topic Date Due   DEXA scan (bone density measurement)  Never done   Mammogram  11/16/2021   Zoster (Shingles) Vaccine (2 of 2) 12/21/2021   COVID-19 Vaccine (2 - 2023-24 season) 12/14/2022*   Medicare Annual Wellness Visit  11/29/2023   DTaP/Tdap/Td vaccine (2 - Td or Tdap) 02/02/2028   Colon Cancer Screening  10/17/2030   Pneumonia Vaccine  Completed   Hepatitis C Screening: USPSTF Recommendation to screen - Ages 18-79 yo.  Completed   HPV Vaccine  Aged Out   Flu Shot  Discontinued  *Topic was postponed. The date shown is not the original due date.    Advanced directives: Education   Conditions/risks identified:   Next appointment: Follow up in one year for your annual wellness visit   01-16-2023  @ 11:00  [place] 65 Years and Older, Female Preventive care refers to lifestyle choices and visits with your health care provider that can promote health and wellness. What does preventive care include? A yearly physical exam. This is also called an annual well check. Dental exams once or twice a year. Routine eye exams. Ask your health care provider how often you should have your eyes checked. Personal lifestyle choices, including: Daily care of your teeth and gums. Regular physical activity. Eating a healthy diet. Avoiding tobacco and drug use. Limiting alcohol use. Practicing safe sex. Taking low-dose aspirin every day. Taking vitamin and mineral supplements as recommended by your health care provider. What happens during an  annual well check? The services and screenings done by your health care provider during your annual well check will depend on your age, overall health, lifestyle risk factors, and family history of disease. Counseling  Your health care provider may ask you questions about your: Alcohol use. Tobacco use. Drug use. Emotional well-being. Home and relationship well-being. Sexual activity. Eating habits. History of falls. Memory and ability to understand (cognition). Work and work Statistician. Reproductive health. Screening  You may have the following tests or measurements: Height, weight, and BMI. Blood pressure. Lipid and cholesterol levels. These may be checked every 5 years, or more frequently if you are over 7 years old. Skin check. Lung cancer screening. You may have this screening every year starting at age 39 if you have a 30-pack-year history of smoking and currently smoke or have quit within the past 15 years. Fecal occult blood test (FOBT) of the stool. You may have this test every year starting at age 29. Flexible sigmoidoscopy or colonoscopy. You may have a sigmoidoscopy every 5 years or a colonoscopy every 10 years starting at age 29. Hepatitis C blood test. Hepatitis B blood test. Sexually transmitted disease (STD) testing. Diabetes screening. This is done by checking your blood sugar (glucose) after you have not eaten for a while (fasting). You may have this done every 1-3 years. Bone density scan. This is done to screen for osteoporosis. You may have this done starting  at age 8. Mammogram. This may be done every 1-2 years. Talk to your health care provider about how often you should have regular mammograms. Talk with your health care provider about your test results, treatment options, and if necessary, the need for more tests. Vaccines  Your health care provider may recommend certain vaccines, such as: Influenza vaccine. This is recommended every year. Tetanus,  diphtheria, and acellular pertussis (Tdap, Td) vaccine. You may need a Td booster every 10 years. Zoster vaccine. You may need this after age 22. Pneumococcal 13-valent conjugate (PCV13) vaccine. One dose is recommended after age 23. Pneumococcal polysaccharide (PPSV23) vaccine. One dose is recommended after age 57. Talk to your health care provider about which screenings and vaccines you need and how often you need them. This information is not intended to replace advice given to you by your health care provider. Make sure you discuss any questions you have with your health care provider. Document Released: 01/06/2016 Document Revised: 08/29/2016 Document Reviewed: 10/11/2015 Elsevier Interactive Patient Education  2017 [place] Prevention in the Home Falls can cause injuries. They can happen to people of all ages. There are many things you can do to make your home safe and to help prevent falls. What can I do on the outside of my home? Regularly fix the edges of walkways and driveways and fix any cracks. Remove anything that might make you trip as you walk through a door, such as a raised step or threshold. Trim any bushes or trees on the path to your home. Use bright outdoor lighting. Clear any walking paths of anything that might make someone trip, such as rocks or tools. Regularly check to see if handrails are loose or broken. Make sure that both sides of any steps have handrails. Any raised decks and porches should have guardrails on the edges. Have any leaves, snow, or ice cleared regularly. Use sand or salt on walking paths during winter. Clean up any spills in your garage right away. This includes oil or grease spills. What can I do in the bathroom? Use night lights. Install grab bars by the toilet and in the tub and shower. Do not use towel bars as grab bars. Use non-skid mats or decals in the tub or shower. If you need to sit down in the shower, use a plastic,  non-slip stool. Keep the floor dry. Clean up any water that spills on the floor as soon as it happens. Remove soap buildup in the tub or shower regularly. Attach bath mats securely with double-sided non-slip rug tape. Do not have throw rugs and other things on the floor that can make you trip. What can I do in the bedroom? Use night lights. Make sure that you have a light by your bed that is easy to reach. Do not use any sheets or blankets that are too big for your bed. They should not hang down onto the floor. Have a firm chair that has side arms. You can use this for support while you get dressed. Do not have throw rugs and other things on the floor that can make you trip. What can I do in the kitchen? Clean up any spills right away. Avoid walking on wet floors. Keep items that you use a lot in easy-to-reach places. If you need to reach something above you, use a strong step stool that has a grab bar. Keep electrical cords out of the way. Do not use floor polish or wax that makes  floors slippery. If you must use wax, use non-skid floor wax. Do not have throw rugs and other things on the floor that can make you trip. What can I do with my stairs? Do not leave any items on the stairs. Make sure that there are handrails on both sides of the stairs and use them. Fix handrails that are broken or loose. Make sure that handrails are as long as the stairways. Check any carpeting to make sure that it is firmly attached to the stairs. Fix any carpet that is loose or worn. Avoid having throw rugs at the top or bottom of the stairs. If you do have throw rugs, attach them to the floor with carpet tape. Make sure that you have a light switch at the top of the stairs and the bottom of the stairs. If you do not have them, ask someone to add them for you. What else can I do to help prevent falls? Wear shoes that: Do not have high heels. Have rubber bottoms. Are comfortable and fit you well. Are closed  at the toe. Do not wear sandals. If you use a stepladder: Make sure that it is fully opened. Do not climb a closed stepladder. Make sure that both sides of the stepladder are locked into place. Ask someone to hold it for you, if possible. Clearly mark and make sure that you can see: Any grab bars or handrails. First and last steps. Where the edge of each step is. Use tools that help you move around (mobility aids) if they are needed. These include: Canes. Walkers. Scooters. Crutches. Turn on the lights when you go into a dark area. Replace any light bulbs as soon as they burn out. Set up your furniture so you have a clear path. Avoid moving your furniture around. If any of your floors are uneven, fix them. If there are any pets around you, be aware of where they are. Review your medicines with your doctor. Some medicines can make you feel dizzy. This can increase your chance of falling. Ask your doctor what other things that you can do to help prevent falls. This information is not intended to replace advice given to you by your health care provider. Make sure you discuss any questions you have with your health care provider. Document Released: 10/06/2009 Document Revised: 05/17/2016 Document Reviewed: 01/14/2015 Elsevier Interactive Patient Education  2017 [place] [phone]

## 2022-12-04 ENCOUNTER — Encounter (HOSPITAL_COMMUNITY): Payer: Self-pay | Admitting: Family Medicine

## 2022-12-04 ENCOUNTER — Other Ambulatory Visit (HOSPITAL_COMMUNITY): Payer: Self-pay | Admitting: Family Medicine

## 2022-12-04 DIAGNOSIS — N6310 Unspecified lump in the right breast, unspecified quadrant: Secondary | ICD-10-CM

## 2022-12-07 DIAGNOSIS — R69 Illness, unspecified: Secondary | ICD-10-CM | POA: Diagnosis not present

## 2022-12-10 DIAGNOSIS — D631 Anemia in chronic kidney disease: Secondary | ICD-10-CM | POA: Diagnosis not present

## 2022-12-10 DIAGNOSIS — N189 Chronic kidney disease, unspecified: Secondary | ICD-10-CM | POA: Diagnosis not present

## 2022-12-10 DIAGNOSIS — N184 Chronic kidney disease, stage 4 (severe): Secondary | ICD-10-CM | POA: Diagnosis not present

## 2022-12-10 DIAGNOSIS — N2581 Secondary hyperparathyroidism of renal origin: Secondary | ICD-10-CM | POA: Diagnosis not present

## 2022-12-10 DIAGNOSIS — R809 Proteinuria, unspecified: Secondary | ICD-10-CM | POA: Diagnosis not present

## 2022-12-10 DIAGNOSIS — N179 Acute kidney failure, unspecified: Secondary | ICD-10-CM | POA: Diagnosis not present

## 2022-12-10 DIAGNOSIS — I129 Hypertensive chronic kidney disease with stage 1 through stage 4 chronic kidney disease, or unspecified chronic kidney disease: Secondary | ICD-10-CM | POA: Diagnosis not present

## 2022-12-14 NOTE — Progress Notes (Signed)
Subjective:   Patricia Wu is a 66 y.o. female who presents for Medicare Annual (Subsequent) preventive examination.   Patient location: in office  Provider location: in office    Review of Systems     Cardiac Risk Factors include: advanced age (>27mn, >>59women);family history of premature cardiovascular disease;hypertension;obesity (BMI >30kg/m2)     Objective:    Today's Vitals   11/28/22 1208  BP: (!) 170/84  Pulse: 76  Resp: 16  Temp: 98 F (36.7 C)  SpO2: 99%  Weight: 201 lb 3.2 oz (91.3 kg)  Height: '5\' 2"'$  (1.575 m)   Body mass index is 36.8 kg/m.     11/28/2022   12:09 PM 02/15/2022    9:08 AM 02/14/2022    5:38 PM 10/17/2020   12:25 PM 11/09/2015   12:33 PM  Advanced Directives  Does Patient Have a Medical Advance Directive? No  No No No  Would patient like information on creating a medical advance directive? No - Patient declined No - Patient declined  No - Patient declined     Current Medications (verified) Outpatient Encounter Medications as of 11/28/2022  Medication Sig   acetaminophen (TYLENOL) 325 MG tablet Take 325-650 mg by mouth every 6 (six) hours as needed (pain.).    amLODipine (NORVASC) 10 MG tablet Take 1 tablet (10 mg total) by mouth daily.   atorvastatin (LIPITOR) 10 MG tablet TAKE 1 TABLET(10 MG) BY MOUTH DAILY   calcitRIOL (ROCALTROL) 0.25 MCG capsule Take 0.25 mcg by mouth daily.   Cholecalciferol (VITAMIN D) 125 MCG (5000 UT) CAPS Take by mouth.   sodium bicarbonate 650 MG tablet Take 650 mg by mouth 2 (two) times daily.   furosemide (LASIX) 20 MG tablet Take 0.5 tablets (10 mg total) by mouth daily as needed. For 2-3 days of needed for leg swelling. (Patient not taking: Reported on 09/26/2022)   No facility-administered encounter medications on file as of 11/28/2022.    Allergies (verified) Ibuprofen   History: Past Medical History:  Diagnosis Date   CKD (chronic kidney disease)    Hypercholesteremia    Hypertension     Noncompliance with medication regimen    Past Surgical History:  Procedure Laterality Date   COLONOSCOPY WITH PROPOFOL N/A 10/17/2020   Procedure: COLONOSCOPY WITH PROPOFOL;  Surgeon: CEloise Harman DO;  [facility];  Service: Endoscopy;  Laterality: N/A;  1:00pm   POLYPECTOMY  10/17/2020   Procedure: POLYPECTOMY;  Surgeon: CEloise Harman DO;  [facility];  Service: Endoscopy;;   Family History  Problem Relation Age of Onset   Liver cancer Neg Hx    Colon cancer Neg Hx    Social History   Socioeconomic History   Marital status: Married    Spouse name: Not on file   Number of children: 2   Years of education: Not on file   Highest education level: Not on file  Occupational History   Not on file  Tobacco Use   Smoking status: Never   Smokeless tobacco: Never  Vaping Use   Vaping Use: Never used  Substance and Sexual Activity   Alcohol use: No   Drug use: No   Sexual activity: Yes  Other Topics Concern   Not on file  Social History Narrative   Not on file   Social Determinants of Health   Financial Resource Strain: Low Risk  (11/28/2022)   Overall Financial Resource Strain (CARDIA)    Difficulty of Paying Living  Expenses: Not hard at all  Food Insecurity: No Food Insecurity (11/28/2022)   Hunger Vital Sign    Worried About Running Out of Food in the Last Year: Never true    Ran Out of Food in the Last Year: Never true  Transportation Needs: No Transportation Needs (11/28/2022)   PRAPARE - Hydrologist (Medical): No    Lack of Transportation (Non-Medical): No  Physical Activity: Insufficiently Active (11/28/2022)   Exercise Vital Sign    Days of Exercise per Week: 3 days    Minutes of Exercise per Session: 30 min  Stress: No Stress Concern Present (11/28/2022)   [place]    Feeling of Stress : Not at all  Social Connections: [place] (11/28/2022)   Social Connection and Isolation Panel [NHANES]    Frequency of Communication with Friends and Family: Three times a week    Frequency of Social Gatherings with Friends and Family: Once a week    Attends Religious Services: More than 4 times per year    Active Member of [employer] or Organizations: Yes    Attends Music therapist: More than 4 times per year    Marital Status: Married    Tobacco Counseling Counseling given: Not Answered   Clinical Intake:  Pre-visit preparation completed: Yes  Pain : No/denies pain     Diabetes: No  How often do you need to have someone help you when you read instructions, pamphlets, or other written materials from your doctor or pharmacy?: 1 - Never  Diabetic?  no  Interpreter Needed?: No  Information entered by :: Leroy Kennedy LPN   Activities of Daily Living    11/28/2022   11:43 AM 02/15/2022    9:00 AM  In your present state of health, do you have any difficulty performing the following activities:  Hearing? 0 0  Vision? 0 0  Difficulty concentrating or making decisions? 0 0  Walking or climbing stairs? 0 0  Dressing or bathing? 0 0  Doing errands, shopping? 0 0  Preparing Food and eating ? N   Using the Toilet? N   In the past six months, have you accidently leaked urine? Y   Do you have problems with loss of bowel control? N   Managing your Medications? N   Managing your Finances? N   Housekeeping or managing your Housekeeping? N     Patient Care Team: Wendie Agreste, MD as PCP - General (Family Medicine) Herminio Commons, MD (Inactive) as PCP - Cardiology (Cardiology) Eloise Harman, DO as Consulting Physician (Internal Medicine)  Indicate any recent Medical Services you may have received from other than [facility] providers in the past year (date may be approximate).     Assessment:   This is a routine wellness examination for Patricia Wu.  Hearing/Vision screen Hearing Screening -  Comments:: No trouble hearing Vision Screening - Comments:: Not up date  Dietary issues and exercise activities discussed: Current Exercise Habits: Home exercise routine, Time (Minutes): 30, Frequency (Times/Week): 3, Weekly Exercise (Minutes/Week): 90, Intensity: Mild   Goals Addressed             This Visit's Progress    Patient Stated       Hoping hip will be feeling better       Depression Screen    11/28/2022   11:48 AM 07/11/2022   10:35 AM 04/04/2022   11:01 AM 02/14/2022  8:39 AM 02/08/2021    8:32 AM 08/10/2020    8:36 AM 02/10/2020    8:19 AM  PHQ 2/9 Scores  PHQ - 2 Score 0 0 0 0 0 0 0  PHQ- 9 Score 0  1 2       Fall Risk    11/28/2022   11:43 AM 07/11/2022   10:35 AM 04/04/2022   11:00 AM 02/14/2022    8:39 AM 08/09/2021    8:18 AM  Fall Risk   Falls in the past year? 0 0 0 0 0  Number falls in past yr: 0 0 0 0 0  Injury with Fall? 0 0 0 0 0  Risk for fall due to :  No Fall Risks No Fall Risks No Fall Risks No Fall Risks  Follow up Falls evaluation completed;Education provided;Falls prevention discussed Falls evaluation completed Falls evaluation completed Falls evaluation completed Falls evaluation completed    FALL RISK PREVENTION PERTAINING TO THE HOME:  Any stairs in or around the home? Yes  If so, are there any without handrails? No  Home free of loose throw rugs in walkways, pet beds, electrical cords, etc? Yes  Adequate lighting in your home to reduce risk of falls? Yes   ASSISTIVE DEVICES UTILIZED TO PREVENT FALLS:  Life alert? No  Use of a cane, walker or w/c? No  Grab bars in the bathroom? No  Shower chair or bench in shower? No  Elevated toilet seat or a handicapped toilet? No   TIMED UP AND GO:  Was the test performed? Yes .  Length of time to ambulate 10 feet: 10 sec.   Gait steady and fast without use of assistive device  Cognitive Function:        11/28/2022   12:09 PM  6CIT Screen  What Year? 0 points  What month? 0 points   What time? 0 points  Count back from 20 0 points  Months in reverse 0 points  Repeat phrase 0 points  Total Score 0 points   Immunizations Immunization History  Administered Date(s) Administered   Influenza,inj,Quad PF,6+ Mos 02/01/2018   PNEUMOCOCCAL CONJUGATE-20 02/14/2022   Tdap 02/01/2018   Zoster Recombinat (Shingrix) 10/26/2021   Zoster, Live 01/07/2017    TDAP status: Up to date  Flu Vaccine status: Due, Education has been provided regarding the importance of this vaccine. Advised may receive this vaccine at local pharmacy or Health Dept. Aware to provide a copy of the vaccination record if obtained from local pharmacy or Health Dept. Verbalized acceptance and understanding.  Pneumococcal vaccine status: Up to date  Covid-19 vaccine status: Information provided on how to obtain vaccines.   Qualifies for Shingles Vaccine? Yes   Zostavax completed No   Shingrix Completed?: Yes  Screening Tests Health Maintenance  Topic Date Due   DEXA SCAN  Never done   MAMMOGRAM  11/16/2021   Zoster Vaccines- Shingrix (2 of 2) 12/21/2021   COVID-19 Vaccine (2 - 2023-24 season) 12/14/2022 (Originally 08/24/2022)   Medicare Annual Wellness (AWV)  11/29/2023   DTaP/Tdap/Td (2 - Td or Tdap) 02/02/2028   COLONOSCOPY (Pts 45-14yr Insurance coverage will need to be confirmed)  10/17/2030   Pneumonia Vaccine 66 Years old  Completed   Hepatitis C Screening  Completed   HPV VACCINES  Aged Out   INFLUENZA VACCINE  Discontinued    Health Maintenance  Health Maintenance Due  Topic Date Due   DEXA SCAN  Never done   MAMMOGRAM  11/16/2021  Zoster Vaccines- Shingrix (2 of 2) 12/21/2021    Colorectal cancer screening: Type of screening: Colonoscopy. Completed 2021. Repeat every 10 years  Mammogram status: Ordered  . Pt provided with contact info and advised to call to schedule appt.   Bone Density status: Ordered  . Pt provided with contact info and advised to call to schedule  appt.  Lung Cancer Screening: (Low Dose CT Chest recommended if Age 17-80 years, 30 pack-year currently smoking OR have quit w/in 15years.) does not qualify.   Lung Cancer Screening Referral:   Additional Screening:  Hepatitis C Screening: does not qualify; Completed 2021  Vision Screening: Recommended annual ophthalmology exams for early detection of glaucoma and other disorders of the eye. Is the patient up to date with their annual eye exam?  No  Who is the provider or what is the name of the office in which the patient attends annual eye exams?  If pt is not established with a provider, would they like to be referred to a provider to establish care? No .   Dental Screening: Recommended annual dental exams for proper oral hygiene  Community Resource Referral / Chronic Care Management: CRR required this visit?  No   CCM required this visit?  No      Plan:     I have personally reviewed and noted the following in the patient's chart:   Medical and social history Use of alcohol, tobacco or illicit drugs  Current medications and supplements including opioid prescriptions. Patient is not currently taking opioid prescriptions. Functional ability and status Nutritional status Physical activity Advanced directives List of other physicians Hospitalizations, surgeries, and ER visits in previous 12 months Vitals Screenings to include cognitive, depression, and falls Referrals and appointments  In addition, I have reviewed and discussed with patient certain preventive protocols, quality metrics, and best practice recommendations. A written personalized care plan for preventive services as well as general preventive health recommendations were provided to patient.     Leroy Kennedy, LPN 03/31/8890   Nurse Notes:  none

## 2022-12-27 ENCOUNTER — Ambulatory Visit (HOSPITAL_COMMUNITY)
Admission: RE | Admit: 2022-12-27 | Discharge: 2022-12-27 | Disposition: A | Discharge status: Home / Self Care | Payer: No Typology Code available for payment source | Source: Ambulatory Visit | Attending: Family Medicine | Admitting: Family Medicine

## 2022-12-27 DIAGNOSIS — E2839 Other primary ovarian failure: Secondary | ICD-10-CM | POA: Diagnosis not present

## 2022-12-27 DIAGNOSIS — Z13820 Encounter for screening for osteoporosis: Secondary | ICD-10-CM | POA: Diagnosis not present

## 2022-12-27 DIAGNOSIS — N6315 Unspecified lump in the right breast, overlapping quadrants: Secondary | ICD-10-CM | POA: Diagnosis not present

## 2022-12-27 DIAGNOSIS — N6310 Unspecified lump in the right breast, unspecified quadrant: Secondary | ICD-10-CM

## 2022-12-27 DIAGNOSIS — Z780 Asymptomatic menopausal state: Secondary | ICD-10-CM | POA: Diagnosis not present

## 2022-12-27 DIAGNOSIS — N6312 Unspecified lump in the right breast, upper inner quadrant: Secondary | ICD-10-CM | POA: Diagnosis not present

## 2022-12-27 DIAGNOSIS — M810 Age-related osteoporosis without current pathological fracture: Secondary | ICD-10-CM | POA: Insufficient documentation

## 2023-01-02 ENCOUNTER — Encounter: Payer: No Typology Code available for payment source | Admitting: Family Medicine

## 2023-01-04 ENCOUNTER — Telehealth: Payer: Self-pay | Admitting: Family Medicine

## 2023-01-04 MED ORDER — ALBUTEROL SULFATE HFA 108 (90 BASE) MCG/ACT IN AERS: 1.0000 | INHALATION_SPRAY | Freq: Four times a day (QID) | RESPIRATORY_TRACT | 0 refills | Status: DC | PRN

## 2023-01-04 NOTE — Telephone Encounter (Signed)
Pt notes uses about 1-2 uses per week, no new wheezing

## 2023-01-04 NOTE — Telephone Encounter (Signed)
Encourage patient to contact the pharmacy for refills or they can request refills through [facility]  (Please schedule appointment if patient has not been seen in over a year)  Last visit was 07/11/22.   WHAT PHARMACY WOULD THEY LIKE THIS SENT TO: [facility] - [place], [place] AT [address]  MEDICATION NAME & DOSE: Albuterol inhaler      NOTES/COMMENTS FROM PATIENT: patient states that she has miss placed her inhaler. Patient wants to know if Dr.Greene send a new inhaler to pharmacy. Pt has a upcoming appt with Dr.Greene on 01/16/23      Front office please notify patient: It takes 48-72 hours to process rx refill requests Ask patient to call pharmacy to ensure rx is ready before heading there.

## 2023-01-04 NOTE — Telephone Encounter (Signed)
I will refill the inhaler to have if needed, but please make sure she is not requiring that frequently or new illness.  Keep appointment as scheduled January 24.  If she does have a new illness, should have evaluation as we have not discussed wheezing in a few years.

## 2023-01-04 NOTE — Telephone Encounter (Signed)
Pt is asking for a refill on Albuterol inhaler looking thru the med list it does not look like it has been refilled since 2019. Is it ok to refill or bring pt in for visit ?

## 2023-01-16 ENCOUNTER — Ambulatory Visit: Payer: No Typology Code available for payment source | Admitting: Family Medicine

## 2023-01-16 VITALS — BP 144/80 | HR 85 | Temp 97.8°F | Ht 62.0 in | Wt 197.8 lb

## 2023-01-16 DIAGNOSIS — N184 Chronic kidney disease, stage 4 (severe): Secondary | ICD-10-CM | POA: Diagnosis not present

## 2023-01-16 DIAGNOSIS — E785 Hyperlipidemia, unspecified: Secondary | ICD-10-CM

## 2023-01-16 DIAGNOSIS — I10 Essential (primary) hypertension: Secondary | ICD-10-CM

## 2023-01-16 DIAGNOSIS — R062 Wheezing: Secondary | ICD-10-CM | POA: Diagnosis not present

## 2023-01-16 LAB — HEPATIC FUNCTION PANEL
ALT: 13 U/L (ref 0–35)
AST: 12 U/L (ref 0–37)
Albumin: 4 g/dL (ref 3.5–5.2)
Alkaline Phosphatase: 229 U/L — ABNORMAL HIGH (ref 39–117)
Bilirubin, Direct: 0.1 mg/dL (ref 0.0–0.3)
Total Bilirubin: 0.5 mg/dL (ref 0.2–1.2)
Total Protein: 7.7 g/dL (ref 6.0–8.3)

## 2023-01-16 LAB — LIPID PANEL
Cholesterol: 187 mg/dL (ref 0–200)
HDL: 49.5 mg/dL (ref >39.00)
LDL Cholesterol: 121 mg/dL — ABNORMAL HIGH (ref 0–99)
NonHDL: 137.48
Total CHOL/HDL Ratio: 4
Triglycerides: 83 mg/dL (ref 0.0–149.0)
VLDL: 16.6 mg/dL (ref 0.0–40.0)

## 2023-01-16 MED ORDER — AMLODIPINE BESYLATE 10 MG PO TABS: 10.0000 mg | ORAL_TABLET | Freq: Every day | ORAL | 2 refills | Status: DC

## 2023-01-16 MED ORDER — ATORVASTATIN CALCIUM 10 MG PO TABS: ORAL_TABLET | ORAL | 2 refills | Status: DC

## 2023-01-16 NOTE — Patient Instructions (Addendum)
Previous episode of wheezing was probably reactive airway or due to chemicals.  Glad to hear that improved quickly with albuterol.  If you require albuterol more than a few doses or you have wheezing or shortness of breath without exposure to a chemical or perfume, be seen as it could be related to your other medical conditions.  No medication changes today, but I will let you know if there are any concerns on labs.  Keep follow-up with your nephrologist as planned.  Take care!

## 2023-01-16 NOTE — Progress Notes (Unsigned)
Subjective:  Patient ID: Patricia Wu, female    DOB: 02/01/1956  Age: 67 y.o. MRN: [id number]  CC:  Chief Complaint  Patient presents with   Hyperlipidemia   Hypertension    HPI [name] presents for   Hypertension: With CKD stage 4.  Followed by nephrology, most recent visit 12/10/2022, Dr. Moshe Cipro.  CKD stage IV.  Prior AKI/CKD with peak creatinine 4.3, ACE, HCTZ was stopped at that time.  Unfortunately creatinine had not returned to previous baseline and likely has progressive kidney disease.  No changes last visit, continue to hold ACE/ARB and not thought to benefit from SGLT2 or kerendia at this time.  Continue to avoid NSAIDs.  She was referred to [facility] for evaluation for [facility].  Dialysis options were also discussed.  Referred to Dr. Donnetta Hutching for access placement.  Not a candidate for aVF, plan to refer back for AVG when close.  Not uremic at her last visit.  Creatinine 3.49 on 12/10/2022.  Continued on Norvasc with acceptable blood pressure at last renal visit.  Not planning to restart ACE unless GFR recovers over 25. Feeling ok, no new symptoms since visit.  Noticed some wheezing after being around cleaning products around the 12th. Wheezing resolved with use of albuterol 2 times. No further need or recent dyspnea/wheeze. No recent need for lasix.  Went out for breakfast today. More sodium today.  Home readings: 118-122/91.  No new swelling/CP/dyspnea.  BP Readings from Last 3 Encounters:  01/16/23 (!) 144/80  11/28/22 (!) 170/84  09/26/22 (!) 170/83   Lab Results  Component Value Date   CREATININE 3.25 (H) 07/11/2022   Hyperlipidemia: Lipitor '10mg'$  qd. No new myalgias/side effects.  Lab Results  Component Value Date   CHOL 181 07/11/2022   HDL 50.70 07/11/2022   LDLCALC 112 (H) 07/11/2022   TRIG 88.0 07/11/2022   CHOLHDL 4 07/11/2022   Lab Results  Component Value Date   ALT 9 07/11/2022   AST 13 07/11/2022   GGT 247 (H)  10/11/2020   ALKPHOS 302 (H) 07/11/2022   BILITOT 0.5 07/11/2022      History Patient Active Problem List   Diagnosis Date Noted   Acute-on-chronic kidney injury ([place]) 02/15/2022   Hyperlipidemia 02/15/2022   Obesity (BMI 30-39.9) 02/15/2022   Colon cancer screening 09/21/2020   Elevated alkaline phosphatase level 09/21/2020   Essential hypertension 02/18/2018   Elevated brain natriuretic peptide (BNP) level 02/18/2018   CKD (chronic kidney disease) stage 3, GFR 30-59 ml/min (HCC) 02/18/2018   Past Medical History:  Diagnosis Date   CKD (chronic kidney disease)    Hypercholesteremia    Hypertension    Noncompliance with medication regimen    Past Surgical History:  Procedure Laterality Date   COLONOSCOPY WITH PROPOFOL N/A 10/17/2020   Procedure: COLONOSCOPY WITH PROPOFOL;  Surgeon: Eloise Harman, DO;  [facility];  Service: Endoscopy;  Laterality: N/A;  1:00pm   POLYPECTOMY  10/17/2020   Procedure: POLYPECTOMY;  Surgeon: Eloise Harman, DO;  [facility];  Service: Endoscopy;;   Allergies  Allergen Reactions   Ibuprofen Other (See Comments)    pcp said raises her BP   Prior to Admission medications   Medication Sig Start Date End Date Taking? Authorizing Provider  acetaminophen (TYLENOL) 325 MG tablet Take 325-650 mg by mouth every 6 (six) hours as needed (pain.).    Yes [provider]  albuterol (VENTOLIN HFA) 108 (90 Base) MCG/ACT inhaler Inhale  1-2 puffs into the lungs every 6 (six) hours as needed for wheezing or shortness of breath. 01/04/23  Yes Wendie Agreste, MD  amLODipine (NORVASC) 10 MG tablet Take 1 tablet (10 mg total) by mouth daily. 07/11/22  Yes Wendie Agreste, MD  atorvastatin (LIPITOR) 10 MG tablet TAKE 1 TABLET(10 MG) BY MOUTH DAILY 10/02/22  Yes Wendie Agreste, MD  calcitRIOL (ROCALTROL) 0.25 MCG capsule Take 0.25 mcg by mouth daily.   Yes [provider]  sodium bicarbonate 650 MG tablet Take 650  mg by mouth 2 (two) times daily.   Yes [provider]  Cholecalciferol (VITAMIN D) 125 MCG (5000 UT) CAPS Take by mouth. Patient not taking: Reported on 01/16/2023    [provider]  furosemide (LASIX) 20 MG tablet Take 0.5 tablets (10 mg total) by mouth daily as needed. For 2-3 days of needed for leg swelling. Patient not taking: Reported on 09/26/2022 07/11/22   Wendie Agreste, MD   Social History   Socioeconomic History   Marital status: Married    Spouse name: Not on file   Number of children: 2   Years of education: Not on file   Highest education level: Not on file  Occupational History   Not on file  Tobacco Use   Smoking status: Never   Smokeless tobacco: Never  Vaping Use   Vaping Use: Never used  Substance and Sexual Activity   Alcohol use: No   Drug use: No   Sexual activity: Yes  Other Topics Concern   Not on file  Social History Narrative   Not on file   Social Determinants of Health   Financial Resource Strain: Low Risk  (11/28/2022)   Overall Financial Resource Strain (CARDIA)    Difficulty of Paying Living Expenses: Not hard at all  Food Insecurity: No Food Insecurity (11/28/2022)   Hunger Vital Sign    Worried About Running Out of Food in the Last Year: Never true    Ran Out of Food in the Last Year: Never true  Transportation Needs: No Transportation Needs (11/28/2022)   PRAPARE - Hydrologist (Medical): No    Lack of Transportation (Non-Medical): No  Physical Activity: Insufficiently Active (11/28/2022)   Exercise Vital Sign    Days of Exercise per Week: 3 days    Minutes of Exercise per Session: 30 min  Stress: No Stress Concern Present (11/28/2022)   [place]    Feeling of Stress : Not at all  Social Connections: [place] (11/28/2022)   Social Connection and Isolation Panel [NHANES]    Frequency of Communication with Friends  and Family: Three times a week    Frequency of Social Gatherings with Friends and Family: Once a week    Attends Religious Services: More than 4 times per year    Active Member of [employer] or Organizations: Yes    Attends Music therapist: More than 4 times per year    Marital Status: Married  Human resources officer Violence: Not At Risk (11/28/2022)   Humiliation, Afraid, Rape, and Kick questionnaire    Fear of Current or Ex-Partner: No    Emotionally Abused: No    Physically Abused: No    Sexually Abused: No    Review of Systems Per HPI.   Objective:   Vitals:   01/16/23 1100  BP: (!) 144/80  Pulse: 85  Temp: 97.8 F (36.6 C)  TempSrc: Oral  SpO2: 99%  Weight: 197 lb 12.8 oz (89.7 kg)  Height: '5\' 2"'$  (1.575 m)     Physical Exam Vitals reviewed.  Constitutional:      Appearance: Normal appearance. She is well-developed.  HENT:     Head: Normocephalic and atraumatic.  Eyes:     Conjunctiva/sclera: Conjunctivae normal.     Pupils: Pupils are equal, round, and reactive to light.  Neck:     Vascular: No carotid bruit.  Cardiovascular:     Rate and Rhythm: Normal rate and regular rhythm.     Heart sounds: Normal heart sounds.  Pulmonary:     Effort: Pulmonary effort is normal.     Breath sounds: Normal breath sounds.  Abdominal:     Palpations: Abdomen is soft. There is no pulsatile mass.     Tenderness: There is no abdominal tenderness.  Musculoskeletal:     Right lower leg: Edema (tr-1+ bilat.) present.     Left lower leg: Edema present.  Skin:    General: Skin is warm and dry.  Neurological:     Mental Status: She is alert and oriented to person, place, and time.  Psychiatric:        Mood and Affect: Mood normal.        Behavior: Behavior normal.      Assessment & Plan:  VELTA ROCKHOLT is a 67 y.o. female . Hyperlipidemia, unspecified hyperlipidemia type - Plan: atorvastatin (LIPITOR) 10 MG tablet, Lipid panel, Hepatic function panel  -Tolerating  current dose of atorvastatin, check updated labs, continue same.  Essential hypertension - Plan: amLODipine (NORVASC) 10 MG tablet CKD (chronic kidney disease) stage 4, GFR 15-29 ml/min (HCC)  -Borderline blood pressure in office, may have been related to increase sodium in diet earlier.  Home readings have been better.  No med changes for now, continue close follow-up with nephrology, RTC precautions if elevated blood pressures.  No apparent sign of fluid overload at present.  Wheezing  -Brief spell of wheezing as above, and appears to have been triggered by cleaning products/chemicals.  Unlikely CHF or related to renal failure as quick resolution with albuterol.  Lungs clear on exam presently.  Intermittent use of albuterol if needed with RTC/ER precautions given.   Meds ordered this encounter  Medications   amLODipine (NORVASC) 10 MG tablet    Sig: Take 1 tablet (10 mg total) by mouth daily.    Dispense:  90 tablet    Refill:  2   atorvastatin (LIPITOR) 10 MG tablet    Sig: TAKE 1 TABLET(10 MG) BY MOUTH DAILY    Dispense:  90 tablet    Refill:  2   Patient Instructions  Previous episode of wheezing was probably reactive airway or due to chemicals.  Glad to hear that improved quickly with albuterol.  If you require albuterol more than a few doses or you have wheezing or shortness of breath without exposure to a chemical or perfume, be seen as it could be related to your other medical conditions.  No medication changes today, but I will let you know if there are any concerns on labs.  Keep follow-up with your nephrologist as planned.  Take care!    Signed,   Merri Ray, MD [place], [facility] 01/16/23 11:43 AM

## 2023-02-11 DIAGNOSIS — R809 Proteinuria, unspecified: Secondary | ICD-10-CM | POA: Diagnosis not present

## 2023-02-11 DIAGNOSIS — N189 Chronic kidney disease, unspecified: Secondary | ICD-10-CM | POA: Diagnosis not present

## 2023-02-11 DIAGNOSIS — N184 Chronic kidney disease, stage 4 (severe): Secondary | ICD-10-CM | POA: Diagnosis not present

## 2023-02-11 DIAGNOSIS — D631 Anemia in chronic kidney disease: Secondary | ICD-10-CM | POA: Diagnosis not present

## 2023-02-11 DIAGNOSIS — N2581 Secondary hyperparathyroidism of renal origin: Secondary | ICD-10-CM | POA: Diagnosis not present

## 2023-02-11 DIAGNOSIS — N179 Acute kidney failure, unspecified: Secondary | ICD-10-CM | POA: Diagnosis not present

## 2023-02-11 DIAGNOSIS — I129 Hypertensive chronic kidney disease with stage 1 through stage 4 chronic kidney disease, or unspecified chronic kidney disease: Secondary | ICD-10-CM | POA: Diagnosis not present

## 2023-02-18 ENCOUNTER — Other Ambulatory Visit: Payer: Self-pay

## 2023-02-18 DIAGNOSIS — D631 Anemia in chronic kidney disease: Secondary | ICD-10-CM | POA: Insufficient documentation

## 2023-02-18 DIAGNOSIS — N184 Chronic kidney disease, stage 4 (severe): Secondary | ICD-10-CM | POA: Insufficient documentation

## 2023-02-20 ENCOUNTER — Telehealth: Payer: Self-pay

## 2023-02-20 ENCOUNTER — Other Ambulatory Visit: Payer: Self-pay

## 2023-02-20 NOTE — Telephone Encounter (Signed)
Received a call from patient requesting to confirm surgery for October 4. States that she received a call from her renal doctor to schedule surgery at that date. Advised patient, I will contact Dr. Elissa Hefty office to verify.  Spoke with [place] at [facility], who clarified that patient needs a dialysis graft placed at next available time with Dr. Donnetta Hutching. Advised earliest date available is March 12. Doristine Bosworth stated this would work great due to patient has a follow up office visit on March 11.   Spoke with patient and offered to schedule for March 12 but patient declined stating she has another appointment. Patient with some hesitancy agreed to schedule for March 19, but requested to call our office back tomorrow after confirming with husband.

## 2023-02-21 ENCOUNTER — Other Ambulatory Visit: Payer: Self-pay

## 2023-02-21 ENCOUNTER — Telehealth: Payer: Self-pay | Admitting: Pharmacy Technician

## 2023-02-21 DIAGNOSIS — N184 Chronic kidney disease, stage 4 (severe): Secondary | ICD-10-CM

## 2023-02-21 NOTE — Telephone Encounter (Signed)
Auth Submission: APPROVED '@AP'$  Payer: DEVOTED Medication & CPT/J Code(s) submitted: RETACRIT [id number] Route of submission (phone, fax, portal):  Phone [phone] Fax # [phone] Auth type: Buy/Bill Units/visits requested: Q4WKS Reference number: [id number] Approval from: 02/20/23 to 02/21/24

## 2023-02-21 NOTE — Telephone Encounter (Signed)
Spoke with patient. She agreed to proceed with surgery on March 19 at [facility]. Instructions provided and she verbalized understanding.   Patient also inquired if she could take Jardiance to help her kidneys. Advised patient that she would have to discuss this with Dr. Marval Regal or her PCP. Patient verbalized understanding.

## 2023-02-27 ENCOUNTER — Encounter (HOSPITAL_COMMUNITY)
Admission: RE | Admit: 2023-02-27 | Discharge: 2023-02-27 | Disposition: A | Discharge status: Home / Self Care | Payer: No Typology Code available for payment source | Source: Ambulatory Visit | Attending: Nephrology | Admitting: Nephrology

## 2023-02-27 VITALS — BP 154/80 | HR 88 | Temp 97.8°F | Resp 18

## 2023-02-27 DIAGNOSIS — N184 Chronic kidney disease, stage 4 (severe): Secondary | ICD-10-CM | POA: Insufficient documentation

## 2023-02-27 DIAGNOSIS — D631 Anemia in chronic kidney disease: Secondary | ICD-10-CM | POA: Insufficient documentation

## 2023-02-27 LAB — POCT HEMOGLOBIN-HEMACUE: Hemoglobin: 8.3 g/dL — ABNORMAL LOW (ref 12.0–15.0)

## 2023-02-27 MED ORDER — CLONIDINE HCL 0.1 MG PO TABS: 0.1000 mg | ORAL_TABLET | Freq: Once | ORAL | Status: DC

## 2023-02-27 MED ORDER — EPOETIN ALFA-EPBX 10000 UNIT/ML IJ SOLN
20000.0000 [IU] | Freq: Once | INTRAMUSCULAR | Status: AC
  Administered 2023-02-27: 20000 [IU] via SUBCUTANEOUS
  Filled 2023-02-27: qty 2

## 2023-02-27 NOTE — Addendum Note (Signed)
Encounter addended by: Baxter Hire, RN on: 02/27/2023 9:05 AM  Actions taken: Therapy plan modified

## 2023-02-27 NOTE — Progress Notes (Signed)
Diagnosis: Anemia in Chronic Kidney Disease  Provider:  Donato Heinz MD  Procedure: Injection  Retacrit (epoetin alfa-epbx), Dose: 20000 Units, Site: subcutaneous, Number of injections: 2  Post Care: Patient declined observation  Discharge: Condition: Good, Destination: Home . AVS Provided  Performed by:  Kennith Maes, RN       Hgb 8.3. injection in abdominal and left arm. 10,000 unit vials

## 2023-03-04 ENCOUNTER — Encounter (HOSPITAL_COMMUNITY): Payer: Self-pay | Admitting: Emergency Medicine

## 2023-03-04 ENCOUNTER — Other Ambulatory Visit (HOSPITAL_COMMUNITY): Payer: Self-pay

## 2023-03-04 ENCOUNTER — Other Ambulatory Visit: Payer: Self-pay

## 2023-03-04 ENCOUNTER — Encounter (HOSPITAL_COMMUNITY): Payer: Self-pay | Admitting: Nephrology

## 2023-03-04 ENCOUNTER — Emergency Department (HOSPITAL_COMMUNITY): Payer: No Typology Code available for payment source

## 2023-03-04 ENCOUNTER — Inpatient Hospital Stay (HOSPITAL_COMMUNITY)
Admission: EM | Admit: 2023-03-04 | Discharge: 2023-03-06 | DRG: 309 | Disposition: A | Discharge status: Home / Self Care | Payer: No Typology Code available for payment source | Source: Ambulatory Visit | Attending: Family Medicine | Admitting: Family Medicine

## 2023-03-04 DIAGNOSIS — I10 Essential (primary) hypertension: Secondary | ICD-10-CM | POA: Diagnosis not present

## 2023-03-04 DIAGNOSIS — E8770 Fluid overload, unspecified: Secondary | ICD-10-CM | POA: Diagnosis not present

## 2023-03-04 DIAGNOSIS — Z79899 Other long term (current) drug therapy: Secondary | ICD-10-CM | POA: Diagnosis not present

## 2023-03-04 DIAGNOSIS — J9811 Atelectasis: Secondary | ICD-10-CM | POA: Diagnosis not present

## 2023-03-04 DIAGNOSIS — I120 Hypertensive chronic kidney disease with stage 5 chronic kidney disease or end stage renal disease: Secondary | ICD-10-CM | POA: Diagnosis not present

## 2023-03-04 DIAGNOSIS — R809 Proteinuria, unspecified: Secondary | ICD-10-CM | POA: Diagnosis not present

## 2023-03-04 DIAGNOSIS — I2510 Atherosclerotic heart disease of native coronary artery without angina pectoris: Secondary | ICD-10-CM | POA: Diagnosis not present

## 2023-03-04 DIAGNOSIS — E782 Mixed hyperlipidemia: Secondary | ICD-10-CM

## 2023-03-04 DIAGNOSIS — M898X9 Other specified disorders of bone, unspecified site: Secondary | ICD-10-CM | POA: Diagnosis present

## 2023-03-04 DIAGNOSIS — I4891 Unspecified atrial fibrillation: Principal | ICD-10-CM

## 2023-03-04 DIAGNOSIS — N185 Chronic kidney disease, stage 5: Secondary | ICD-10-CM | POA: Diagnosis not present

## 2023-03-04 DIAGNOSIS — D631 Anemia in chronic kidney disease: Secondary | ICD-10-CM | POA: Diagnosis present

## 2023-03-04 DIAGNOSIS — Z6837 Body mass index (BMI) 37.0-37.9, adult: Secondary | ICD-10-CM

## 2023-03-04 DIAGNOSIS — J45909 Unspecified asthma, uncomplicated: Secondary | ICD-10-CM | POA: Diagnosis not present

## 2023-03-04 DIAGNOSIS — N184 Chronic kidney disease, stage 4 (severe): Secondary | ICD-10-CM | POA: Diagnosis not present

## 2023-03-04 DIAGNOSIS — E7800 Pure hypercholesterolemia, unspecified: Secondary | ICD-10-CM | POA: Diagnosis not present

## 2023-03-04 DIAGNOSIS — J4520 Mild intermittent asthma, uncomplicated: Secondary | ICD-10-CM | POA: Diagnosis not present

## 2023-03-04 DIAGNOSIS — Z7901 Long term (current) use of anticoagulants: Secondary | ICD-10-CM | POA: Diagnosis not present

## 2023-03-04 DIAGNOSIS — D649 Anemia, unspecified: Secondary | ICD-10-CM | POA: Diagnosis not present

## 2023-03-04 DIAGNOSIS — I129 Hypertensive chronic kidney disease with stage 1 through stage 4 chronic kidney disease, or unspecified chronic kidney disease: Secondary | ICD-10-CM | POA: Diagnosis not present

## 2023-03-04 DIAGNOSIS — N250 Renal osteodystrophy: Secondary | ICD-10-CM | POA: Diagnosis not present

## 2023-03-04 DIAGNOSIS — N189 Chronic kidney disease, unspecified: Secondary | ICD-10-CM | POA: Diagnosis present

## 2023-03-04 DIAGNOSIS — Z886 Allergy status to analgesic agent status: Secondary | ICD-10-CM

## 2023-03-04 DIAGNOSIS — N179 Acute kidney failure, unspecified: Secondary | ICD-10-CM | POA: Diagnosis present

## 2023-03-04 DIAGNOSIS — Z01810 Encounter for preprocedural cardiovascular examination: Secondary | ICD-10-CM | POA: Diagnosis not present

## 2023-03-04 DIAGNOSIS — Z91148 Patient's other noncompliance with medication regimen for other reason: Secondary | ICD-10-CM

## 2023-03-04 DIAGNOSIS — I429 Cardiomyopathy, unspecified: Secondary | ICD-10-CM | POA: Diagnosis not present

## 2023-03-04 DIAGNOSIS — I4819 Other persistent atrial fibrillation: Principal | ICD-10-CM | POA: Diagnosis present

## 2023-03-04 DIAGNOSIS — R002 Palpitations: Secondary | ICD-10-CM | POA: Diagnosis not present

## 2023-03-04 DIAGNOSIS — E669 Obesity, unspecified: Secondary | ICD-10-CM | POA: Diagnosis not present

## 2023-03-04 DIAGNOSIS — E7849 Other hyperlipidemia: Secondary | ICD-10-CM

## 2023-03-04 DIAGNOSIS — E876 Hypokalemia: Secondary | ICD-10-CM | POA: Diagnosis not present

## 2023-03-04 DIAGNOSIS — N2581 Secondary hyperparathyroidism of renal origin: Secondary | ICD-10-CM | POA: Diagnosis not present

## 2023-03-04 DIAGNOSIS — R0602 Shortness of breath: Secondary | ICD-10-CM | POA: Diagnosis not present

## 2023-03-04 DIAGNOSIS — E785 Hyperlipidemia, unspecified: Secondary | ICD-10-CM | POA: Diagnosis present

## 2023-03-04 LAB — CBC
HCT: 29.3 % — ABNORMAL LOW (ref 36.0–46.0)
Hemoglobin: 9.3 g/dL — ABNORMAL LOW (ref 12.0–15.0)
MCH: 29.3 pg (ref 26.0–34.0)
MCHC: 31.7 g/dL (ref 30.0–36.0)
MCV: 92.4 fL (ref 80.0–100.0)
Platelets: 284 10*3/uL (ref 150–400)
RBC: 3.17 MIL/uL — ABNORMAL LOW (ref 3.87–5.11)
RDW: 15.9 % — ABNORMAL HIGH (ref 11.5–15.5)
WBC: 7.9 10*3/uL (ref 4.0–10.5)
nRBC: 0 % (ref 0.0–0.2)

## 2023-03-04 LAB — BASIC METABOLIC PANEL
Anion gap: 12 (ref 5–15)
BUN: 50 mg/dL — ABNORMAL HIGH (ref 8–23)
CO2: 18 mmol/L — ABNORMAL LOW (ref 22–32)
Calcium: 8.3 mg/dL — ABNORMAL LOW (ref 8.9–10.3)
Chloride: 114 mmol/L — ABNORMAL HIGH (ref 98–111)
Creatinine, Ser: 7.15 mg/dL — ABNORMAL HIGH (ref 0.44–1.00)
GFR, Estimated: 6 mL/min — ABNORMAL LOW (ref >60)
Glucose, Bld: 96 mg/dL (ref 70–99)
Potassium: 3.6 mmol/L (ref 3.5–5.1)
Sodium: 144 mmol/L (ref 135–145)

## 2023-03-04 LAB — MAGNESIUM
Magnesium: 1.8 mg/dL (ref 1.7–2.4)
Magnesium: 1.9 mg/dL (ref 1.7–2.4)

## 2023-03-04 LAB — TSH: TSH: 2.381 u[IU]/mL (ref 0.350–4.500)

## 2023-03-04 LAB — PHOSPHORUS: Phosphorus: 6.1 mg/dL — ABNORMAL HIGH (ref 2.5–4.6)

## 2023-03-04 LAB — TROPONIN I (HIGH SENSITIVITY)
Troponin I (High Sensitivity): 19 ng/L — ABNORMAL HIGH (ref <18)
Troponin I (High Sensitivity): 18 ng/L — ABNORMAL HIGH (ref <18)

## 2023-03-04 MED ORDER — HEPARIN (PORCINE) 25000 UT/250ML-% IV SOLN
1000.0000 [IU]/h | INTRAVENOUS | Status: DC
  Administered 2023-03-04: 1000 [IU]/h via INTRAVENOUS
  Filled 2023-03-04: qty 250

## 2023-03-04 MED ORDER — ALBUTEROL SULFATE (2.5 MG/3ML) 0.083% IN NEBU: 2.5000 mg | INHALATION_SOLUTION | Freq: Four times a day (QID) | RESPIRATORY_TRACT | Status: DC | PRN

## 2023-03-04 MED ORDER — ALBUTEROL SULFATE HFA 108 (90 BASE) MCG/ACT IN AERS: 1.0000 | INHALATION_SPRAY | Freq: Four times a day (QID) | RESPIRATORY_TRACT | Status: DC | PRN

## 2023-03-04 MED ORDER — AMLODIPINE BESYLATE 5 MG PO TABS
10.0000 mg | ORAL_TABLET | Freq: Every day | ORAL | Status: DC
  Administered 2023-03-05: 10 mg via ORAL
  Filled 2023-03-04: qty 2

## 2023-03-04 MED ORDER — DILTIAZEM HCL 25 MG/5ML IV SOLN: 10.0000 mg | Freq: Once | INTRAVENOUS | Status: DC

## 2023-03-04 MED ORDER — ACETAMINOPHEN 650 MG RE SUPP: 650.0000 mg | Freq: Four times a day (QID) | RECTAL | Status: DC | PRN

## 2023-03-04 MED ORDER — HEPARIN BOLUS VIA INFUSION
4000.0000 [IU] | Freq: Once | INTRAVENOUS | Status: AC
  Administered 2023-03-04: 4000 [IU] via INTRAVENOUS

## 2023-03-04 MED ORDER — CALCITRIOL 0.25 MCG PO CAPS
0.2500 ug | ORAL_CAPSULE | Freq: Every day | ORAL | Status: DC
  Administered 2023-03-05 – 2023-03-06 (×2): 0.25 ug via ORAL
  Filled 2023-03-04 (×2): qty 1

## 2023-03-04 MED ORDER — ONDANSETRON HCL 4 MG/2ML IJ SOLN: 4.0000 mg | Freq: Four times a day (QID) | INTRAMUSCULAR | Status: DC | PRN

## 2023-03-04 MED ORDER — DILTIAZEM HCL 25 MG/5ML IV SOLN
20.0000 mg | Freq: Four times a day (QID) | INTRAVENOUS | Status: DC | PRN
  Administered 2023-03-04: 20 mg via INTRAVENOUS
  Filled 2023-03-04: qty 5

## 2023-03-04 MED ORDER — SODIUM BICARBONATE 650 MG PO TABS
650.0000 mg | ORAL_TABLET | Freq: Two times a day (BID) | ORAL | Status: DC
  Administered 2023-03-04 – 2023-03-06 (×4): 650 mg via ORAL
  Filled 2023-03-04 (×4): qty 1

## 2023-03-04 MED ORDER — ACETAMINOPHEN 325 MG PO TABS: 650.0000 mg | ORAL_TABLET | Freq: Four times a day (QID) | ORAL | Status: DC | PRN

## 2023-03-04 MED ORDER — ATORVASTATIN CALCIUM 10 MG PO TABS
10.0000 mg | ORAL_TABLET | Freq: Every day | ORAL | Status: DC
  Administered 2023-03-05 – 2023-03-06 (×2): 10 mg via ORAL
  Filled 2023-03-04 (×2): qty 1

## 2023-03-04 MED ORDER — ONDANSETRON HCL 4 MG PO TABS: 4.0000 mg | ORAL_TABLET | Freq: Four times a day (QID) | ORAL | Status: DC | PRN

## 2023-03-04 MED ORDER — DILTIAZEM HCL 25 MG/5ML IV SOLN
20.0000 mg | Freq: Once | INTRAVENOUS | Status: AC
  Administered 2023-03-04: 20 mg via INTRAVENOUS
  Filled 2023-03-04: qty 5

## 2023-03-04 MED ORDER — SODIUM CHLORIDE 0.45 % IV SOLN: INTRAVENOUS | Status: DC

## 2023-03-04 NOTE — Progress Notes (Signed)
ANTICOAGULATION CONSULT NOTE - Initial Consult  Pharmacy Consult for heparin Indication: atrial fibrillation  Allergies  Allergen Reactions   Ibuprofen Other (See Comments)    pcp said raises her BP    Patient Measurements:   Heparin Dosing Weight: 72kg  Vital Signs: Temp: 97.9 F (36.6 C) (03/11 1350) Temp Source: Oral (03/11 1350) BP: 157/94 (03/11 1356) Pulse Rate: 130 (03/11 1350)  Labs: No results for input(s): "HGB", "HCT", "PLT", "APTT", "LABPROT", "INR", "HEPARINUNFRC", "HEPRLOWMOCWT", "CREATININE", "CKTOTAL", "CKMB", "TROPONINIHS" in the last 72 hours.  CrCl cannot be calculated (Patient's most recent lab result is older than the maximum 21 days allowed.).   Medical History: Past Medical History:  Diagnosis Date   CKD (chronic kidney disease)    Hypercholesteremia    Hypertension    Noncompliance with medication regimen     Assessment: 67 year old female with new irregular heart rate/afib noted in the ED. Orders to start IV heparin. Hemoglobin of 9.3 appears consistent with previous labs.   Goal of Therapy:  Heparin level 0.3-0.7 units/ml Monitor platelets by anticoagulation protocol: Yes   Plan:  Give 4000 units bolus x 1 Start heparin infusion at 1000 units/hr Check anti-Xa level in 8 hours and daily while on heparin Continue to monitor H&H and platelets  Erin Hearing PharmD., BCPS Clinical Pharmacist 03/04/2023 2:49 PM

## 2023-03-04 NOTE — Consult Note (Signed)
CARDIOLOGY CONSULT NOTE    Patient ID: Patricia Wu; [id number]; 08/19/1956   Admit date: 03/04/2023 Date of Consult: 03/04/2023  Primary Care Provider: Wendie Agreste, MD Primary Cardiologist:  Primary Electrophysiologist:     Patient Profile:   Patricia Wu is a 67 y.o. female with a hx of HTN, HLD, CKD who is being seen today for the evaluation of new onset atrial fibrillation at the request of Dr Patricia Wu.  History of Present Illness:   Patricia Wu is a 67 year old F known to have HTN, HLD, CKD was sent from nephrology clinic today for further evaluation of irregular HR. She denies any palpitations but noticed feeling lethargic in the last few days associated with SOB. Denies any chest pain or DOE. No prior history of CVA. This is something new to her and was never diagnosed with any atrial arrhythmias in the past. No family history of arrhythmias.  Past Medical History:  Diagnosis Date   CKD (chronic kidney disease)    Hypercholesteremia    Hypertension    Noncompliance with medication regimen     Past Surgical History:  Procedure Laterality Date   COLONOSCOPY WITH PROPOFOL N/A 10/17/2020   Procedure: COLONOSCOPY WITH PROPOFOL;  Surgeon: Eloise Harman, DO;  [facility];  Service: Endoscopy;  Laterality: N/A;  1:00pm   POLYPECTOMY  10/17/2020   Procedure: POLYPECTOMY;  Surgeon: Eloise Harman, DO;  Location[facility];  Service: Endoscopy;;       Inpatient Medications: Scheduled Meds:  Continuous Infusions:  heparin 1,000 Units/hr (03/04/23 1508)   PRN Meds:   Allergies:    Allergies  Allergen Reactions   Ibuprofen Other (See Comments)    pcp said raises her BP    Social History:   Social History   Socioeconomic History   Marital status: Married    Spouse name: Not on file   Number of children: 2   Years of education: Not on file   Highest education level: Not on file  Occupational History   Not on file  Tobacco  Use   Smoking status: Never   Smokeless tobacco: Never  Vaping Use   Vaping Use: Never used  Substance and Sexual Activity   Alcohol use: No   Drug use: No   Sexual activity: Yes  Other Topics Concern   Not on file  Social History Narrative   Not on file   Social Determinants of Health   Financial Resource Strain: Low Risk  (11/28/2022)   Overall Financial Resource Strain (CARDIA)    Difficulty of Paying Living Expenses: Not hard at all  Food Insecurity: No Food Insecurity (11/28/2022)   Hunger Vital Sign    Worried About Running Out of Food in the Last Year: Never true    [place] in the Last Year: Never true  Transportation Needs: No Transportation Needs (11/28/2022)   PRAPARE - Hydrologist (Medical): No    Lack of Transportation (Non-Medical): No  Physical Activity: Insufficiently Active (11/28/2022)   Exercise Vital Sign    Days of Exercise per Week: 3 days    Minutes of Exercise per Session: 30 min  Stress: No Stress Concern Present (11/28/2022)   [place]    Feeling of Stress : Not at all  Social Connections: [place] (11/28/2022)   Social Connection and Isolation Panel [NHANES]    Frequency of Communication with Friends  and Family: Three times a week    Frequency of Social Gatherings with Friends and Family: Once a week    Attends Religious Services: More than 4 times per year    Active Member of [employer] or Organizations: Yes    Attends Music therapist: More than 4 times per year    Marital Status: Married  Human resources officer Violence: Not At Risk (11/28/2022)   Humiliation, Afraid, Rape, and Kick questionnaire    Fear of Current or Ex-Partner: No    Emotionally Abused: No    Physically Abused: No    Sexually Abused: No    Family History:    Family History  Problem Relation Age of Onset   Liver cancer Neg Hx    Colon cancer Neg Hx       ROS:  Please see the history of present illness.  ROS  All other ROS reviewed and negative.     Physical Exam/Data:   Vitals:   03/04/23 1616 03/04/23 1640 03/04/23 1700 03/04/23 1701  BP:    (!) 152/107  Pulse:  64  (!) 109  Resp:  (!) 31  (!) 24  Temp: 98.3 F (36.8 C)   98.5 F (36.9 C)  TempSrc: Oral   Oral  SpO2:  100%  100%  Weight:   93 kg   Height:   '5\' 2"'$  (1.575 m)    No intake or output data in the 24 hours ending 03/04/23 1704 Filed Weights   03/04/23 1435 03/04/23 1700  Weight: 92.5 kg 93 kg   Body mass index is 37.5 kg/m.  General:  Well nourished, well developed, in no acute distress HEENT: normal Lymph: no adenopathy Neck: no JVD Endocrine:  No thryomegaly Vascular: No carotid bruits; FA pulses 2+ bilaterally without bruits  Cardiac:  normal S1, S2; irregular rate and rhythm; no murmur  Lungs:  clear to auscultation bilaterally, no wheezing, rhonchi or rales  Abd: soft, nontender, no hepatomegaly  Ext: no edema Musculoskeletal:  No deformities, BUE and BLE strength normal and equal Skin: warm and dry  Neuro:  CNs 2-12 intact, no focal abnormalities noted Psych:  Normal affect   EKG:  The EKG was personally reviewed and demonstrates: Atrial fibrillation, rate controlled Telemetry:  Telemetry was personally reviewed and demonstrates: Atrial fibrillation, HR between 80-1 20.  Relevant CV Studies:   Laboratory Data:  Chemistry Recent Labs  Lab 03/04/23 1357  NA 144  K 3.6  CL 114*  CO2 18*  GLUCOSE 96  BUN 50*  CREATININE 7.15*  CALCIUM 8.3*  GFRNONAA 6*  ANIONGAP 12    No results for input(s): "PROT", "ALBUMIN", "AST", "ALT", "ALKPHOS", "BILITOT" in the last 168 hours. Hematology Recent Labs  Lab 02/27/23 0838 03/04/23 1357  WBC  --  7.9  RBC  --  3.17*  HGB 8.3* 9.3*  HCT  --  29.3*  MCV  --  92.4  MCH  --  29.3  MCHC  --  31.7  RDW  --  15.9*  PLT  --  284   Cardiac EnzymesNo results for input(s): "TROPONINI" in the  last 168 hours. No results for input(s): "TROPIPOC" in the last 168 hours.  BNPNo results for input(s): "BNP", "PROBNP" in the last 168 hours.  DDimer No results for input(s): "DDIMER" in the last 168 hours.  Radiology/Studies:  DG Chest Port 1 View  Result Date: 03/04/2023 CLINICAL DATA:  Shortness of breath.  Irregular heartbeat. EXAM: PORTABLE CHEST 1 VIEW COMPARISON:  Radiographs 06/05/2013.  Abdominal CT 02/14/2022. FINDINGS: 1419 hours. The heart size is at the upper limits of normal for portable AP technique. Probable mild atelectasis at both lung bases. No edema, confluent airspace opacity, pleural effusion or pneumothorax identified. No acute osseous findings are seen. There are mild degenerative changes in the spine. Telemetry leads overlie the chest. IMPRESSION: Borderline heart size with probable mild bibasilar atelectasis. No edema or confluent airspace opacity. Electronically Signed   By: Richardean Sale M.D.   On: 03/04/2023 14:26    Assessment and Plan:   Patient is a 67 year old F known to have HTN, HLD, CKD was sent from nephrology clinic for evaluation of irregular HR. EKG showed atrial fibrillation, rate controlled.  # New onset atrial fibrillation with RVR, partially rate controlled (HR ranging between 80-120) -Patient denied any palpitations but has fatigue/lethargy for the last couple of days. Start metoprolol 25 mg every 6 hours. Can uptitrate metoprolol depending on the HR. Goal HR less than 70 at rest. She might convert to NSR with rate controlling strategy, if not, she will benefit from DCCV procedure in the outpatient setting. -Switch heparin drip to Eliquis 5 mg twice daily (CHA2DS2-VASc score is 3) -Obtain TSH and OSA evaluation as outpatient. -Obtain 2D echocardiogram -Outpatient cardiology follow-up  # HTN, partially controlled -Continue amlodipine 10 mg once daily  # HLD -Continue atorvastatin 10 mg nightly. Goal LDL less than 100.  I have spent a total of  60 minutes with patient reviewing chart , telemetry, EKGs, labs and examining patient as well as establishing an assessment and plan that was discussed with the patient.  > 50% of time was spent in direct patient care.     For questions or updates, please contact [place] Please consult [email or web address] for contact info under Cardiology/STEMI.   Signed, Vangie Bicker, MD 03/04/2023 5:04 PM

## 2023-03-04 NOTE — TOC Benefit Eligibility Note (Signed)
Patient Teacher, English as a foreign language completed.    The patient is currently admitted and upon discharge could be taking Eliquis 5 mg.  The current 30 day co-pay is $442.00 due to a $395.00 deductible.  Will be $47.00 once deductible is met.   The patient is insured through [place], [place] Patient Advocate Specialist [place] Patient Advocate Team Direct Number: [phone]  Fax: [phone]

## 2023-03-04 NOTE — Progress Notes (Signed)
Centralized telemetry unit called to report patient's heart rate sustained in the high 140-130's. Patient stated she had been to the restroom x2. Patient was in bed when I arrived to her room. Patient given prn medication. Charge RN aware. Plan of care ongoing.

## 2023-03-04 NOTE — H&P (Signed)
History and Physical    Patient: Patricia Wu [id number] DOB: [birth year] DOA: 03/04/2023 DOS: the patient was seen and examined on 03/04/2023 PCP: Wendie Agreste, MD  Patient coming from: Home  Chief Complaint:  Chief Complaint  Patient presents with   Irregular Heart Beat   HPI: Patricia Wu is a 67 y.o. female with medical history significant of CKD, HTN, HLD.  Presenting from Nephrologists office after 2 days of SOB and chest pressure. At Nephrologist pt was noted to be in Afib w/ an accelerated rate. Denies wheezing, fever, CP, LE edema above baseline, HA. Endorsing compliance w/ home medical regimen. Nothing makes sx better. Worse w/ ambulation.   In the ED pt was noted to be in Afib w/ RVR. Given Dilt x2 w/ resolution in RVR. Cards consulted for tx.   Review of Systems: As mentioned in the history of present illness. All other systems reviewed and are negative. Past Medical History:  Diagnosis Date   CKD (chronic kidney disease)    Hypercholesteremia    Hypertension    Noncompliance with medication regimen    Past Surgical History:  Procedure Laterality Date   COLONOSCOPY WITH PROPOFOL N/A 10/17/2020   Procedure: COLONOSCOPY WITH PROPOFOL;  Surgeon: Eloise Harman, DO;  [facility];  Service: Endoscopy;  Laterality: N/A;  1:00pm   POLYPECTOMY  10/17/2020   Procedure: POLYPECTOMY;  Surgeon: Eloise Harman, DO;  [facility];  Service: Endoscopy;;   Social History:  reports that she has never smoked. She has never used smokeless tobacco. She reports that she does not drink alcohol and does not use drugs.  Allergies  Allergen Reactions   Ibuprofen Other (See Comments)    pcp said raises her BP    Family History  Problem Relation Age of Onset   Liver cancer Neg Hx    Colon cancer Neg Hx     Prior to Admission medications   Medication Sig Start Date End Date Taking? Authorizing Provider  albuterol (VENTOLIN HFA) 108 (90 Base)  MCG/ACT inhaler Inhale 1-2 puffs into the lungs every 6 (six) hours as needed for wheezing or shortness of breath. 01/04/23  Yes Wendie Agreste, MD  amLODipine (NORVASC) 10 MG tablet Take 1 tablet (10 mg total) by mouth daily. 01/16/23  Yes Wendie Agreste, MD  atorvastatin (LIPITOR) 10 MG tablet TAKE 1 TABLET(10 MG) BY MOUTH DAILY 01/16/23  Yes Wendie Agreste, MD  calcitRIOL (ROCALTROL) 0.25 MCG capsule Take 0.25 mcg by mouth daily.   Yes [provider]  sodium bicarbonate 650 MG tablet Take 650 mg by mouth 2 (two) times daily.   Yes [provider]  acetaminophen (TYLENOL) 325 MG tablet Take 325-650 mg by mouth every 6 (six) hours as needed (pain.).     [provider]  furosemide (LASIX) 20 MG tablet Take 0.5 tablets (10 mg total) by mouth daily as needed. For 2-3 days of needed for leg swelling. Patient not taking: Reported on 09/26/2022 07/11/22   Wendie Agreste, MD    Physical Exam: Vitals:   03/04/23 1616 03/04/23 1640 03/04/23 1700 03/04/23 1701  BP:    (!) 152/107  Pulse:  64  (!) 109  Resp:  (!) 31  (!) 24  Temp: 98.3 F (36.8 C)   98.5 F (36.9 C)  TempSrc: Oral   Oral  SpO2:  100%  100%  Weight:   93 kg   Height:   '5\' 2"'$  (1.575  m)   General:  Appears calm and comfortable Eyes:  PERRL, EOMI, normal lids, iris ENT:  grossly normal hearing, lips & tongue, mmm Neck:  no LAD, masses or thyromegaly Cardiovascular: Irregularly irregular. 1+ LE edema.  Respiratory:  CTA bilaterally, no w/r/r. Normal respiratory effort. Abdomen:  soft, ntnd, NABS Skin:  no rash or induration seen on limited exam Musculoskeletal:  grossly normal tone BUE/BLE, good ROM, no bony abnormality Psychiatric:  grossly normal mood and affect, speech fluent and appropriate, AOx3 Neurologic:  CN 2-12 grossly intact, moves all extremities in coordinated fashion, sensation intact Data Reviewed: ECG: Afib.  CXR: cardiomegaly w/ atelectasis w/o acute cardiopulmonary process.    Assessment and Plan: Principal Problem:   Atrial fibrillation with RVR (HCC) Active Problems:   Essential hypertension   Acute-on-chronic kidney injury ([place])   Hyperlipidemia   Chronic kidney disease, stage IV (severe) (HCC)   Asthma, chronic  Afib/RVR: New problem for pt. Stable after 2 doses of IV Dilt. Cardiology consulted by EDP and will see on 03/05/23. Trop 19. Asymptomatic at time of admission.  - Tele - Dilt PRN - Continue Heparin - f/u Cards recommendations.   CKD IV-V: Cr 7.15. Last known Cr 3.5 (06/2022). Pt w/ a regularly established Renal team. Per pt she is being evaluated for AV graft. Makes urine daily.  - Hold Lasix until am and renal function can be evaluated again. - BMP in am  - Gentle hydration 1/2NS 75cc/hr.   HTN: - Continue home Norvasc  Asthma: Stable on RA - continue home Albuterol PRN  HLD: - continue home Lipitor   Advance Care Planning:   Code Status: Full Code   Consults: Cardiology  Family Communication: Daughter  Severity of Illness: The appropriate patient status for this patient is INPATIENT. Inpatient status is judged to be reasonable and necessary in order to provide the required intensity of service to ensure the patient's safety. The patient's presenting symptoms, physical exam findings, and initial radiographic and laboratory data in the context of their chronic comorbidities is felt to place them at high risk for further clinical deterioration. Furthermore, it is not anticipated that the patient will be medically stable for discharge from the hospital within 2 midnights of admission.   * I certify that at the point of admission it is my clinical judgment that the patient will require inpatient hospital care spanning beyond 2 midnights from the point of admission due to high intensity of service, high risk for further deterioration and high frequency of surveillance required.*  Author: Waldemar Dickens, MD 03/04/2023 5:20 PM  For on  call review [email or web address].

## 2023-03-04 NOTE — ED Triage Notes (Signed)
Pt came from nephrologist today and sent her to ED for irregular HR. Pt states she feels fine. Pt denies cp/sob. Pt does states when she walks for a little bit she gets sob x 2 days. A/o. No sob noted. Bp and hr elevated in triage.

## 2023-03-04 NOTE — ED Provider Notes (Signed)
[facility] Provider Note   CSN: [id number] Arrival date & time: 03/04/23  1322     History  Chief Complaint  Patient presents with   Irregular Heart Beat   HPI Patricia Wu is a 67 y.o. female with CKD, hypertension, and hyperlipidemia presenting for irregular heartbeat.  States she was being seen at the nephrology office today when her medical provider know she had an irregular fast heart rate.  At this time patient denies chest pain shortness of breath.  She did state however that she has been more short of breath in the past 2 days.  Blood pressure and heart rate noted to be elevated in triage.  Denies Calf tenderness. No prior history of atrial fibrillation.   HPI     Home Medications Prior to Admission medications   Medication Sig Start Date End Date Taking? Authorizing Provider  albuterol (VENTOLIN HFA) 108 (90 Base) MCG/ACT inhaler Inhale 1-2 puffs into the lungs every 6 (six) hours as needed for wheezing or shortness of breath. 01/04/23  Yes Wendie Agreste, MD  amLODipine (NORVASC) 10 MG tablet Take 1 tablet (10 mg total) by mouth daily. 01/16/23  Yes Wendie Agreste, MD  atorvastatin (LIPITOR) 10 MG tablet TAKE 1 TABLET(10 MG) BY MOUTH DAILY 01/16/23  Yes Wendie Agreste, MD  calcitRIOL (ROCALTROL) 0.25 MCG capsule Take 0.25 mcg by mouth daily.   Yes [provider]  sodium bicarbonate 650 MG tablet Take 650 mg by mouth 2 (two) times daily.   Yes [provider]  acetaminophen (TYLENOL) 325 MG tablet Take 325-650 mg by mouth every 6 (six) hours as needed (pain.).     [provider]  furosemide (LASIX) 20 MG tablet Take 0.5 tablets (10 mg total) by mouth daily as needed. For 2-3 days of needed for leg swelling. Patient not taking: Reported on 09/26/2022 07/11/22   Wendie Agreste, MD      Allergies    Ibuprofen    Review of Systems   Review of Systems  Cardiovascular:        Irregular  heart rate    Physical Exam   Vitals:   03/04/23 1616 03/04/23 1640  BP:    Pulse:  64  Resp:  (!) 31  Temp: 98.3 F (36.8 C)   SpO2:  100%    CONSTITUTIONAL:  well-appearing, NAD NEURO:  Alert and oriented x 3, CN 3-12 grossly intact EYES:  eyes equal and reactive ENT/NECK:  Supple, no stridor  CARDIO: Tachycardic and irregular rhythm, appears well-perfused  PULM:  No respiratory distress, CTAB GI/GU:  non-distended, soft MSK/SPINE:  No gross deformities, no edema, moves all extremities  SKIN:  no rash, atraumatic  *Additional and/or pertinent findings included in MDM below   ED Results / Procedures / Treatments   Labs (all labs ordered are listed, but only abnormal results are displayed) Labs Reviewed  BASIC METABOLIC PANEL - Abnormal; Notable for the following components:      Result Value   Chloride 114 (*)    CO2 18 (*)    BUN 50 (*)    Creatinine, Ser 7.15 (*)    Calcium 8.3 (*)    GFR, Estimated 6 (*)    All other components within normal limits  CBC - Abnormal; Notable for the following components:   RBC 3.17 (*)    Hemoglobin 9.3 (*)    HCT 29.3 (*)    RDW 15.9 (*)  All other components within normal limits  TROPONIN I (HIGH SENSITIVITY) - Abnormal; Notable for the following components:   Troponin I (High Sensitivity) 19 (*)    All other components within normal limits  MAGNESIUM  HEPARIN LEVEL (UNFRACTIONATED)  TROPONIN I (HIGH SENSITIVITY)    EKG EKG Interpretation  Date/Time:  Monday March 04 2023 14:39:27 EDT Ventricular Rate:  87 PR Interval:    QRS Duration: 90 QT Interval:  350 QTC Calculation: 421 R Axis:   64 Text Interpretation: Atrial fibrillation Borderline repolarization abnormality rate slower than prior today Confirmed by Aletta Edouard [phone]) on 03/04/2023 3:18:17 PM  Radiology DG Chest Port 1 View  Result Date: 03/04/2023 CLINICAL DATA:  Shortness of breath.  Irregular heartbeat. EXAM: PORTABLE CHEST 1 VIEW COMPARISON:   Radiographs 06/05/2013.  Abdominal CT 02/14/2022. FINDINGS: 1419 hours. The heart size is at the upper limits of normal for portable AP technique. Probable mild atelectasis at both lung bases. No edema, confluent airspace opacity, pleural effusion or pneumothorax identified. No acute osseous findings are seen. There are mild degenerative changes in the spine. Telemetry leads overlie the chest. IMPRESSION: Borderline heart size with probable mild bibasilar atelectasis. No edema or confluent airspace opacity. Electronically Signed   By: Richardean Sale M.D.   On: 03/04/2023 14:26    Procedures Procedures    Medications Ordered in ED Medications  heparin ADULT infusion 100 units/mL (25000 units/258m) (1,000 Units/hr Intravenous New Bag/Given 03/04/23 1508)  diltiazem (CARDIZEM) injection 20 mg (20 mg Intravenous Given 03/04/23 1436)  heparin bolus via infusion 4,000 Units (4,000 Units Intravenous Bolus from Bag 03/04/23 1506)    ED Course/ Medical Decision Making/ A&P                             Medical Decision Making Amount and/or Complexity of Data Reviewed Labs: ordered. Radiology: ordered.  Risk Prescription drug management. Decision regarding hospitalization.   67yo female who is well-appearing and hemodynamically stable presenting for irregular heart rate.  Exam notable for irregular tachycardic rhythm but otherwise reassuring.  DDx includes atrial fibrillation, SVT, ACS, PE and pneumonia.  Interpreted EKG which did reveal concern for atrial fibrillation.  Heart rate initially was 131 and hypertensive.  Treated for likely new onset atrial fibrillation with bolus of diltiazem and started heparin drip.  Discussed patient with Dr. MDellia Cloudwho agreed with management and advised to admit to the hospital for ongoing evaluation.  Admitted to hospital service with Dr. DPayton Emerald         Final Clinical Impression(s) / ED Diagnoses Final diagnoses:  Atrial fibrillation with RVR  ([facility]    Rx / DC Orders ED Discharge Orders          Ordered    Amb referral to AFIB Clinic        03/04/23 1421              RHarriet Pho PA-C 03/04/23 1643    HIsla Pence MD 03/05/23 [phone]

## 2023-03-04 NOTE — ED Notes (Signed)
ED TO INPATIENT HANDOFF REPORT  ED Nurse Name and Phone #: [phone]  S Name/Age/Gender Patricia Wu 67 y.o. female Room/Bed: APA18/APA18  Code Status   Code Status: Prior  Home/SNF/Other Home Patient oriented to: self, place, time, and situation Is this baseline? Yes   Triage Complete: Triage complete  Chief Complaint Atrial fibrillation with RVR ([place]) [I48.91]  Triage Note Pt came from nephrologist today and sent her to ED for irregular HR. Pt states she feels fine. Pt denies cp/sob. Pt does states when she walks for a little bit she gets sob x 2 days. A/o. No sob noted. Bp and hr elevated in triage.    Allergies Allergies  Allergen Reactions   Ibuprofen Other (See Comments)    pcp said raises her BP    Level of Care/Admitting Diagnosis ED Disposition     ED Disposition  Admit   Condition  --   [place]: [facility] [id number]  Level of Care: Telemetry [5]  Covid Evaluation: Asymptomatic - no recent exposure (last 10 days) testing not required  Diagnosis: [id number]  Admitting Physician: [place], [place]  Attending Physician: Marily Memos, [place] [id number]  Certification:: I certify there are rare and unusual circumstances requiring inpatient admission  Estimated Length of Stay: 3          B Medical/Surgery History Past Medical History:  Diagnosis Date   CKD (chronic kidney disease)    Hypercholesteremia    Hypertension    Noncompliance with medication regimen    Past Surgical History:  Procedure Laterality Date   COLONOSCOPY WITH PROPOFOL N/A 10/17/2020   Procedure: COLONOSCOPY WITH PROPOFOL;  Surgeon: Eloise Harman, DO;  Location: [facility];  Service: Endoscopy;  Laterality: N/A;  1:00pm   POLYPECTOMY  10/17/2020   Procedure: POLYPECTOMY;  Surgeon: Eloise Harman, DO;  Location: [facility];  Service: Endoscopy;;     A IV Location/Drains/Wounds Patient Lines/Drains/Airways  Status     Active Line/Drains/Airways     Name Placement date Placement time Site Days   Peripheral IV 03/04/23 20 G Right Antecubital 03/04/23  1436  Antecubital  less than 1            Intake/Output Last 24 hours No intake or output data in the 24 hours ending 03/04/23 1605  Labs/Imaging Results for orders placed or performed during the hospital encounter of 03/04/23 (from the past 48 hour(s))  Basic metabolic panel     Status: Abnormal   Collection Time: 03/04/23  1:57 PM  Result Value Ref Range   Sodium 144 135 - 145 mmol/L   Potassium 3.6 3.5 - 5.1 mmol/L   Chloride 114 (H) 98 - 111 mmol/L   CO2 18 (L) 22 - 32 mmol/L   Glucose, Bld 96 70 - 99 mg/dL    Comment: Glucose reference range applies only to samples taken after fasting for at least 8 hours.   BUN 50 (H) 8 - 23 mg/dL   Creatinine, Ser 7.15 (H) 0.44 - 1.00 mg/dL   Calcium 8.3 (L) 8.9 - 10.3 mg/dL   GFR, Estimated 6 (L) >60 mL/min    Comment: (NOTE) Calculated using the CKD-EPI Creatinine Equation (2021)    Anion gap 12 5 - 15    Comment: Performed at [facility], [address] [place], [place] [address]  CBC     Status: Abnormal   Collection Time: 03/04/23  1:57 PM  Result Value Ref  Range   WBC 7.9 4.0 - 10.5 K/uL   RBC 3.17 (L) 3.87 - 5.11 MIL/uL   Hemoglobin 9.3 (L) 12.0 - 15.0 g/dL   HCT 29.3 (L) 36.0 - 46.0 %   MCV 92.4 80.0 - 100.0 fL   MCH 29.3 26.0 - 34.0 pg   MCHC 31.7 30.0 - 36.0 g/dL   RDW 15.9 (H) 11.5 - 15.5 %   Platelets 284 150 - 400 K/uL   nRBC 0.0 0.0 - 0.2 %    Comment: Performed at [facility], [address] [place], [place] [address]  Troponin I (High Sensitivity)     Status: Abnormal   Collection Time: 03/04/23  1:57 PM  Result Value Ref Range   Troponin I (High Sensitivity) 19 (H) <18 ng/L    Comment: (NOTE) Elevated high sensitivity troponin I (hsTnI) values and significant  changes across serial measurements may suggest ACS but many other  chronic and acute conditions are  known to elevate hsTnI results.  Refer to the "Links" section for chest pain algorithms and additional  guidance. Performed at [facility], [address], [place], [place] [address]   Magnesium     Status: None   Collection Time: 03/04/23  2:21 PM  Result Value Ref Range   Magnesium 1.8 1.7 - 2.4 mg/dL    Comment: Performed at [facility], [address], [place], [place] [address]   DG Chest [place] 1 View  Result Date: 03/04/2023 CLINICAL DATA:  Shortness of breath.  Irregular heartbeat. EXAM: PORTABLE CHEST 1 VIEW COMPARISON:  Radiographs 06/05/2013.  Abdominal CT 02/14/2022. FINDINGS: 1419 hours. The heart size is at the upper limits of normal for portable AP technique. Probable mild atelectasis at both lung bases. No edema, confluent airspace opacity, pleural effusion or pneumothorax identified. No acute osseous findings are seen. There are mild degenerative changes in the spine. Telemetry leads overlie the chest. IMPRESSION: Borderline heart size with probable mild bibasilar atelectasis. No edema or confluent airspace opacity. Electronically Signed   By: Richardean Sale M.D.   On: 03/04/2023 14:26    Pending Labs Unresulted Labs (From admission, onward)     Start     Ordered   03/05/23 0500  CBC  Daily,   R      03/04/23 1451   03/05/23 0000  Heparin level (unfractionated)  Once-Timed,   URGENT        03/04/23 1451            Vitals/Pain Today's Vitals   03/04/23 1430 03/04/23 1435 03/04/23 1445 03/04/23 1505  BP: (!) 162/97   (!) 139/90  Pulse: (!) 40  88 (!) 54  Resp: (!) 26  19 (!) 27  Temp:      TempSrc:      SpO2: 100%  100% 100%  Weight:  204 lb (92.5 kg)    Height:  '5\' 2"'$  (1.575 m)    PainSc:        Isolation Precautions No active isolations  Medications Medications  heparin ADULT infusion 100 units/mL (25000 units/221m) (1,000 Units/hr Intravenous New Bag/Given 03/04/23 1508)  diltiazem (CARDIZEM) injection 20 mg (20 mg Intravenous Given 03/04/23 1436)   heparin bolus via infusion 4,000 Units (4,000 Units Intravenous Bolus from Bag 03/04/23 1506)    Mobility walks     Focused Assessments Cardiac Assessment Handoff:  Cardiac Rhythm: Atrial fibrillation Lab Results  Component Value Date   CKTOTAL 66 07/05/2018   TROPONINI <0.30 06/05/2013   No results found  for: "DDIMER" Does the Patient currently have chest pain? No    R Recommendations: See Admitting Provider Note  Report given to:   Additional Notes: [id number]

## 2023-03-05 ENCOUNTER — Other Ambulatory Visit (HOSPITAL_COMMUNITY): Payer: Self-pay | Admitting: *Deleted

## 2023-03-05 ENCOUNTER — Inpatient Hospital Stay (HOSPITAL_COMMUNITY): Payer: No Typology Code available for payment source

## 2023-03-05 ENCOUNTER — Encounter (HOSPITAL_COMMUNITY): Payer: Self-pay | Admitting: Nephrology

## 2023-03-05 DIAGNOSIS — I429 Cardiomyopathy, unspecified: Secondary | ICD-10-CM | POA: Diagnosis not present

## 2023-03-05 DIAGNOSIS — E7849 Other hyperlipidemia: Secondary | ICD-10-CM | POA: Diagnosis not present

## 2023-03-05 DIAGNOSIS — I10 Essential (primary) hypertension: Secondary | ICD-10-CM | POA: Diagnosis not present

## 2023-03-05 DIAGNOSIS — N184 Chronic kidney disease, stage 4 (severe): Secondary | ICD-10-CM | POA: Diagnosis not present

## 2023-03-05 DIAGNOSIS — E876 Hypokalemia: Secondary | ICD-10-CM | POA: Diagnosis not present

## 2023-03-05 DIAGNOSIS — N179 Acute kidney failure, unspecified: Secondary | ICD-10-CM | POA: Diagnosis not present

## 2023-03-05 DIAGNOSIS — Z01810 Encounter for preprocedural cardiovascular examination: Secondary | ICD-10-CM | POA: Diagnosis not present

## 2023-03-05 DIAGNOSIS — I4891 Unspecified atrial fibrillation: Secondary | ICD-10-CM | POA: Diagnosis not present

## 2023-03-05 DIAGNOSIS — D649 Anemia, unspecified: Secondary | ICD-10-CM | POA: Diagnosis not present

## 2023-03-05 DIAGNOSIS — N250 Renal osteodystrophy: Secondary | ICD-10-CM | POA: Diagnosis not present

## 2023-03-05 DIAGNOSIS — I129 Hypertensive chronic kidney disease with stage 1 through stage 4 chronic kidney disease, or unspecified chronic kidney disease: Secondary | ICD-10-CM | POA: Diagnosis not present

## 2023-03-05 LAB — CBC
HCT: 27 % — ABNORMAL LOW (ref 36.0–46.0)
Hemoglobin: 8.3 g/dL — ABNORMAL LOW (ref 12.0–15.0)
MCH: 28.9 pg (ref 26.0–34.0)
MCHC: 30.7 g/dL (ref 30.0–36.0)
MCV: 94.1 fL (ref 80.0–100.0)
Platelets: 266 10*3/uL (ref 150–400)
RBC: 2.87 MIL/uL — ABNORMAL LOW (ref 3.87–5.11)
RDW: 15.9 % — ABNORMAL HIGH (ref 11.5–15.5)
WBC: 7.3 10*3/uL (ref 4.0–10.5)
nRBC: 0 % (ref 0.0–0.2)

## 2023-03-05 LAB — COMPREHENSIVE METABOLIC PANEL
ALT: 15 U/L (ref 0–44)
AST: 11 U/L — ABNORMAL LOW (ref 15–41)
Albumin: 2.6 g/dL — ABNORMAL LOW (ref 3.5–5.0)
Alkaline Phosphatase: 185 U/L — ABNORMAL HIGH (ref 38–126)
Anion gap: 10 (ref 5–15)
BUN: 49 mg/dL — ABNORMAL HIGH (ref 8–23)
CO2: 18 mmol/L — ABNORMAL LOW (ref 22–32)
Calcium: 8 mg/dL — ABNORMAL LOW (ref 8.9–10.3)
Chloride: 114 mmol/L — ABNORMAL HIGH (ref 98–111)
Creatinine, Ser: 7.21 mg/dL — ABNORMAL HIGH (ref 0.44–1.00)
GFR, Estimated: 6 mL/min — ABNORMAL LOW (ref >60)
Glucose, Bld: 85 mg/dL (ref 70–99)
Potassium: 3.3 mmol/L — ABNORMAL LOW (ref 3.5–5.1)
Sodium: 142 mmol/L (ref 135–145)
Total Bilirubin: 0.5 mg/dL (ref 0.3–1.2)
Total Protein: 5.8 g/dL — ABNORMAL LOW (ref 6.5–8.1)

## 2023-03-05 LAB — ECHOCARDIOGRAM COMPLETE
Area-P 1/2: 5.13 cm2
Height: 62 in
MV M vel: 4.26 m/s
MV Peak grad: 72.6 mmHg
Radius: 0.4 cm
S' Lateral: 3.9 cm
Weight: 3280.44 oz

## 2023-03-05 LAB — HIV ANTIBODY (ROUTINE TESTING W REFLEX): HIV Screen 4th Generation wRfx: NONREACTIVE

## 2023-03-05 LAB — IRON AND TIBC
Iron: 39 ug/dL (ref 28–170)
Saturation Ratios: 22 % (ref 10.4–31.8)
TIBC: 179 ug/dL — ABNORMAL LOW (ref 250–450)
UIBC: 140 ug/dL

## 2023-03-05 LAB — HEPARIN LEVEL (UNFRACTIONATED): Heparin Unfractionated: 0.51 IU/mL (ref 0.30–0.70)

## 2023-03-05 MED ORDER — DILTIAZEM LOAD VIA INFUSION
10.0000 mg | Freq: Once | INTRAVENOUS | Status: DC
  Filled 2023-03-05: qty 10

## 2023-03-05 MED ORDER — AMLODIPINE BESYLATE 5 MG PO TABS
10.0000 mg | ORAL_TABLET | Freq: Every day | ORAL | Status: DC
  Administered 2023-03-06: 10 mg via ORAL
  Filled 2023-03-05: qty 2

## 2023-03-05 MED ORDER — DARBEPOETIN ALFA 100 MCG/0.5ML IJ SOSY
100.0000 ug | PREFILLED_SYRINGE | INTRAMUSCULAR | Status: DC
  Administered 2023-03-05: 100 ug via SUBCUTANEOUS
  Filled 2023-03-05 (×2): qty 0.5

## 2023-03-05 MED ORDER — APIXABAN 5 MG PO TABS
5.0000 mg | ORAL_TABLET | Freq: Two times a day (BID) | ORAL | Status: DC
  Administered 2023-03-05 – 2023-03-06 (×3): 5 mg via ORAL
  Filled 2023-03-05 (×3): qty 1

## 2023-03-05 MED ORDER — APIXABAN 5 MG PO TABS: 5.0000 mg | ORAL_TABLET | Freq: Two times a day (BID) | ORAL | Status: DC

## 2023-03-05 MED ORDER — AMLODIPINE BESYLATE 5 MG PO TABS: 10.0000 mg | ORAL_TABLET | Freq: Every day | ORAL | Status: DC

## 2023-03-05 MED ORDER — METOPROLOL TARTRATE 25 MG PO TABS
25.0000 mg | ORAL_TABLET | Freq: Four times a day (QID) | ORAL | Status: DC
  Administered 2023-03-05: 25 mg via ORAL
  Filled 2023-03-05: qty 1

## 2023-03-05 MED ORDER — POTASSIUM CHLORIDE CRYS ER 20 MEQ PO TBCR
20.0000 meq | EXTENDED_RELEASE_TABLET | Freq: Once | ORAL | Status: AC
  Administered 2023-03-05: 20 meq via ORAL
  Filled 2023-03-05: qty 1

## 2023-03-05 MED ORDER — DILTIAZEM HCL-DEXTROSE 125-5 MG/125ML-% IV SOLN (PREMIX)
5.0000 mg/h | INTRAVENOUS | Status: DC
  Filled 2023-03-05: qty 125

## 2023-03-05 MED ORDER — METOPROLOL TARTRATE 50 MG PO TABS
50.0000 mg | ORAL_TABLET | Freq: Four times a day (QID) | ORAL | Status: DC
  Administered 2023-03-05 – 2023-03-06 (×3): 50 mg via ORAL
  Filled 2023-03-05 (×3): qty 1

## 2023-03-05 MED ORDER — SEVELAMER CARBONATE 800 MG PO TABS
800.0000 mg | ORAL_TABLET | Freq: Three times a day (TID) | ORAL | Status: DC
  Administered 2023-03-05 – 2023-03-06 (×3): 800 mg via ORAL
  Filled 2023-03-05 (×3): qty 1

## 2023-03-05 NOTE — Progress Notes (Signed)
*  PRELIMINARY RESULTS* Echocardiogram 2D Echocardiogram has been performed.  Patricia Wu 03/05/2023, 5:54 PM

## 2023-03-05 NOTE — Progress Notes (Signed)
Mobility Specialist Progress Note:    03/05/23 1025  Mobility  Activity Ambulated with assistance in hallway  Level of Assistance Modified independent, requires aide device or extra time  Assistive Device Other (Comment) (IV pole)  Distance Ambulated (ft) 280 ft  Activity Response Tolerated well  Mobility Referral Yes  $Mobility charge 1 Mobility   Pt agreeable to mobility session. Tolerated well, asx throughout. Returned pt to room, EOB for ultrasound. Left pt in care of tech, all needs met.   Royetta Crochet Mobility Specialist Please contact via Solicitor or  Rehab office at [phone]

## 2023-03-05 NOTE — Progress Notes (Signed)
During rounding, patient mentioned to me that her right hand was a little swollen but had gone down some. I informed the patient that we would have to [place] her and she stated ,"no I think its ok, it has went down". Patient daughter is currently at bedside. Patient slept most of the night. No complaint of pain or discomfort. 1 prn medication given. Plan of care ongoing.

## 2023-03-05 NOTE — Progress Notes (Addendum)
Progress Note  Patient Name: Patricia Wu Date of Encounter: 03/05/2023  Primary Cardiologist: Kate Sable, MD (Inactive)  Subjective  No acute events overnight, heart rate continues to be between 80 and 120 (has not changed despite starting metoprolol tartrate).  Continues to be in atrial fibrillation on telemetry.  No palpitations.   Inpatient Medications    Scheduled Meds:  apixaban  5 mg Oral BID   atorvastatin  10 mg Oral Daily   calcitRIOL  0.25 mcg Oral Daily   darbepoetin (ARANESP) injection - DIALYSIS  100 mcg Subcutaneous Q Tue-1800   diltiazem  10 mg Intravenous Once   metoprolol tartrate  25 mg Oral Q6H   sevelamer carbonate  800 mg Oral TID WC   sodium bicarbonate  650 mg Oral BID   Continuous Infusions:  diltiazem (CARDIZEM) infusion     PRN Meds: acetaminophen **OR** acetaminophen, albuterol, diltiazem, ondansetron **OR** ondansetron (ZOFRAN) IV   Vital Signs    Vitals:   03/04/23 2340 03/05/23 0445 03/05/23 0944 03/05/23 1312  BP: 104/82 (!) 141/84 (!) 159/96 117/66  Pulse: 93 80 (!) 109 67  Resp: '18 18 17 16  '$ Temp: 97.8 F (36.6 C) 98.6 F (37 C) 98.1 F (36.7 C) 98.3 F (36.8 C)  TempSrc: Oral  Oral Oral  SpO2: 100% 98% 100% 100%  Weight:      Height:        Intake/Output Summary (Last 24 hours) at 03/05/2023 1328 Last data filed at 03/05/2023 0500 Gross per 24 hour  Intake 1363.95 ml  Output --  Net 1363.95 ml   Filed Weights   03/04/23 1435 03/04/23 1700  Weight: 92.5 kg 93 kg    Telemetry     Personally reviewed, telemetry reviewed and she continues to be in atrial fibrillation with heart rates between 80 and 120.  ECG    EKG not performed today  Physical Exam   GEN: No acute distress.   Neck: No JVD. Cardiac: RRR, no murmur, rub, or gallop.  Respiratory: Nonlabored. Clear to auscultation bilaterally. GI: Soft, nontender, bowel sounds present. MS: No edema; No deformity. Neuro:  Nonfocal. Psych: Alert and  oriented x 3. Normal affect.  Labs    Chemistry Recent Labs  Lab 03/04/23 1357 03/05/23 0408  NA 144 142  K 3.6 3.3*  CL 114* 114*  CO2 18* 18*  GLUCOSE 96 85  BUN 50* 49*  CREATININE 7.15* 7.21*  CALCIUM 8.3* 8.0*  PROT  --  5.8*  ALBUMIN  --  2.6*  AST  --  11*  ALT  --  15  ALKPHOS  --  185*  BILITOT  --  0.5  GFRNONAA 6* 6*  ANIONGAP 12 10     Hematology Recent Labs  Lab 02/27/23 0838 03/04/23 1357 03/05/23 0408  WBC  --  7.9 7.3  RBC  --  3.17* 2.87*  HGB 8.3* 9.3* 8.3*  HCT  --  29.3* 27.0*  MCV  --  92.4 94.1  MCH  --  29.3 28.9  MCHC  --  31.7 30.7  RDW  --  15.9* 15.9*  PLT  --  284 266    Cardiac Enzymes Recent Labs  Lab 03/04/23 1357 03/04/23 1637  TROPONINIHS 19* 18*    BNPNo results for input(s): "BNP", "PROBNP" in the last 168 hours.   DDimerNo results for input(s): "DDIMER" in the last 168 hours.   Radiology    US RENAL  Result Date: 03/05/2023 CLINICAL DATA:  [id number] AKI (acute kidney injury) ([place]) [id number] EXAM: RENAL / URINARY TRACT ULTRASOUND COMPLETE COMPARISON:  CT 02/14/2022 FINDINGS: Right Kidney: Renal measurements: 8.1 x 4.0 x 3.3 cm = volume: 55.14 mL. Increased renal cortical echogenicity. No hydronephrosis. Left Kidney: Renal measurements: 8.1 x 4.9 x 4.2 cm = volume: 85.61 mL. Increased renal cortical echogenicity. No hydronephrosis. Bladder: Mildly distended. Other: None. IMPRESSION: Increased renal cortical echogenicity bilaterally, as can be seen in medical renal disease. No hydronephrosis. Electronically Signed   By: Maurine Simmering M.D.   On: 03/05/2023 11:56   DG Chest Port 1 View  Result Date: 03/04/2023 CLINICAL DATA:  Shortness of breath.  Irregular heartbeat. EXAM: PORTABLE CHEST 1 VIEW COMPARISON:  Radiographs 06/05/2013.  Abdominal CT 02/14/2022. FINDINGS: 1419 hours. The heart size is at the upper limits of normal for portable AP technique. Probable mild atelectasis at both lung bases. No edema, confluent airspace  opacity, pleural effusion or pneumothorax identified. No acute osseous findings are seen. There are mild degenerative changes in the spine. Telemetry leads overlie the chest. IMPRESSION: Borderline heart size with probable mild bibasilar atelectasis. No edema or confluent airspace opacity. Electronically Signed   By: Richardean Sale M.D.   On: 03/04/2023 14:26    Cardiac Studies     Assessment & Plan   Patient is a 67 year old F known to have HTN, HLD, CKD was sent from nephrology clinic for evaluation of irregular HR.  EKG showed atrial fibrillation, rate controlled. Telemetry reviewed, continues to be in atrial fibrillation with heart rates between 80 and 120.  # New onset atrial fibrillation with RVR, partially rate controlled (HR ranging between 80-120) -Patient denied any palpitations but has fatigue/lethargy for the last couple of days. Metoprolol tartrate 25 mg every 6 hours was started with no change in the heart rate trends. Increase metoprolol tartrate from 25 mg to 50 mg every 6 hours. -Switch heparin drip to Eliquis 5 mg twice daily (CHA2DS2-VASc score is 3) -Obtain TSH and OSA evaluation as outpatient -Obtain 2D echocardiogram -Outpatient cardiology follow-up  # HTN, partially controlled -Continue amlodipine 10 mg once daily  # HLD -Continue to statin 10 mg nightly.  Goal LDL less than 100.  I have spent a total of 33 minutes with patient reviewing chart , telemetry, EKGs, labs and examining patient as well as establishing an assessment and plan that was discussed with the patient.  > 50% of time was spent in direct patient care.    Signed, Chalmers Guest, MD  03/05/2023, 1:28 PM

## 2023-03-05 NOTE — TOC Initial Note (Signed)
  Transition of Care (TOC) Screening Note   Patient Details  Name: Patricia Wu Date of Birth: Mar 31, 1956   Transition of Care [facility]) CM/SW Contact:    Boneta Lucks, RN Phone Number: 03/05/2023, 12:57 PM    Transition of Care Department [facility]) has reviewed patient and no TOC needs have been identified at this time. We will continue to monitor patient advancement through interdisciplinary progression rounds. If new patient transition needs arise, please place a TOC consult.      Barriers to Discharge: Continued Medical Work up     Expected Discharge Plan and Services      Living arrangements for the past 2 months: [place]                      Prior Living Arrangements/Services Living arrangements for the past 2 months: Single Family Home Lives with:: Spouse   Activities of Daily Living Home Assistive Devices/Equipment: None ADL Screening (condition at time of admission) Patient's cognitive ability adequate to safely complete daily activities?: Yes Is the patient deaf or have difficulty hearing?: No Does the patient have difficulty seeing, even when wearing glasses/contacts?: No Does the patient have difficulty concentrating, remembering, or making decisions?: No Patient able to express need for assistance with ADLs?: Yes Does the patient have difficulty dressing or bathing?: No Independently performs ADLs?: Yes (appropriate for developmental age) Does the patient have difficulty walking or climbing stairs?: Yes Weakness of Legs: None Weakness of Arms/Hands: None  Permission Sought/Granted        Emotional Assessment       Admission diagnosis:  Atrial fibrillation with RVR ([place]) [I48.91] Patient Active Problem List   Diagnosis Date Noted   Atrial fibrillation with RVR ([place]) 03/04/2023   Asthma, chronic 03/04/2023   Chronic kidney disease, stage IV (severe) ([place]) 02/18/2023   Anemia of chronic renal failure 02/18/2023   Acute-on-chronic kidney  injury ([place]) 02/15/2022   Hyperlipidemia 02/15/2022   Obesity (BMI 30-39.9) 02/15/2022   Colon cancer screening 09/21/2020   Elevated alkaline phosphatase level 09/21/2020   Essential hypertension 02/18/2018   Elevated brain natriuretic peptide (BNP) level 02/18/2018   CKD (chronic kidney disease) stage 3, GFR 30-59 ml/min ([place]) 02/18/2018   PCP:  Wendie Agreste, MD Pharmacy:   [facility], [address] AT [place]. [place] [address] Phone: [phone] Fax: [phone]    Social Determinants of Health (SDOH) Social History: SDOH Screenings   Food Insecurity: No Food Insecurity (11/28/2022)  Housing: Low Risk  (11/28/2022)  Transportation Needs: No Transportation Needs (11/28/2022)  Utilities: Not At Risk (11/28/2022)  Alcohol Screen: Low Risk  (11/28/2022)  Depression (PHQ2-9): Low Risk  (01/16/2023)  Financial Resource Strain: Low Risk  (11/28/2022)  Physical Activity: Insufficiently Active (11/28/2022)  Social Connections: Socially Integrated (11/28/2022)  Stress: No Stress Concern Present (11/28/2022)  Tobacco Use: Low Risk  (03/04/2023)   SDOH Interventions:

## 2023-03-05 NOTE — Progress Notes (Signed)
PROGRESS NOTE    Patricia Wu  [id number] DOB: [birth year] DOA: 03/04/2023 PCP: Wendie Agreste, MD   Brief Narrative:    Patricia Wu is a 67 y.o. female with medical history significant of CKD, HTN, HLD.  Presenting from Nephrologists office after 2 days of SOB and chest pressure. At Nephrologist pt was noted to be in Afib w/ an accelerated rate.  Patient was admitted with new onset atrial fibrillation with RVR and was initially trialed on oral metoprolol, but has not been changed to IV Cardizem drip per cardiology recommendation.  Patient was also seen by nephrology due to AKI on CKD stage IV.  Assessment & Plan:   Principal Problem:   Atrial fibrillation with RVR (H[facility]) Active Problems:   Essential hypertension   Acute-on-chronic kidney injury ([place])   Hyperlipidemia   Chronic kidney disease, stage IV (severe) (H[facility])   Asthma, chronic  Assessment and Plan:   Afib/RVR: New problem for pt. Stable after 2 doses of IV Dilt. Cardiology consulted by EDP and will see on 03/05/23. Trop 19. Asymptomatic at time of admission.  -Appreciate cardiology recommendations with initiation of Eliquis -Started on Cardizem drip and transfer to stepdown today due to poor response to oral medications   AKI on CKD IV: Cr 7.15. Last known Cr 3.5 (06/2022). Pt w/ a regularly established Renal team. Per pt she is being evaluated for AV graft. Makes urine daily.  - Hold Lasix until am and renal function can be evaluated again. - BMP in am  -Hold further IV hydration as patient appears hypervolemic -Appreciate further recommendations per nephrology  Mild hypokalemia: -Gentle repletion given above, continue to monitor   HTN: -Holding home Norvasc due to initiation of Cardizem   Asthma: Stable on RA - continue home Albuterol PRN   HLD: - continue home Lipitor  Obesity: -BMI 37.5    DVT prophylaxis: Eliquis Code Status: Full Family Communication: Daughter at bedside 3/12 Disposition  Plan: Continue management for atrial fibrillation with RVR as well as AKI Status is: Inpatient Remains inpatient appropriate because: Continues to require IV medications  Consultants:  Cardiology Nephrology  Procedures:  None  Antimicrobials:  None   Subjective: Patient seen and evaluated today with no new acute complaints or concerns. No acute concerns or events noted overnight.  She states that she does not have much of an appetite this morning.  Denies any chest pain or shortness of breath or palpitations.  Objective: Vitals:   03/04/23 2340 03/05/23 0445 03/05/23 0944 03/05/23 1312  BP: 104/82 (!) 141/84 (!) 159/96 117/66  Pulse: 93 80 (!) 109 67  Resp: '18 18 17 16  '$ Temp: 97.8 F (36.6 C) 98.6 F (37 C) 98.1 F (36.7 C) 98.3 F (36.8 C)  TempSrc: Oral  Oral Oral  SpO2: 100% 98% 100% 100%  Weight:      Height:        Intake/Output Summary (Last 24 hours) at 03/05/2023 1335 Last data filed at 03/05/2023 0500 Gross per 24 hour  Intake 1363.95 ml  Output --  Net 1363.95 ml   Filed Weights   03/04/23 1435 03/04/23 1700  Weight: 92.5 kg 93 kg    Examination:  General exam: Appears calm and comfortable  Respiratory system: Clear to auscultation. Respiratory effort normal. Cardiovascular system: S1 & S2 heard, irregular and tachycardic Gastrointestinal system: Abdomen is soft Central nervous system: Alert and awake Extremities: Bilateral upper and lower extremity edema noted Skin: No significant lesions noted Psychiatry: Flat affect.  Data Reviewed: I have personally reviewed following labs and imaging studies  CBC: Recent Labs  Lab 02/27/23 0838 03/04/23 1357 03/05/23 0408  WBC  --  7.9 7.3  HGB 8.3* 9.3* 8.3*  HCT  --  29.3* 27.0*  MCV  --  92.4 94.1  PLT  --  284 [id number]   Basic Metabolic Panel: Recent Labs  Lab 03/04/23 1357 03/04/23 1421 03/04/23 1637 03/05/23 0408  NA 144  --   --  142  K 3.6  --   --  3.3*  CL 114*  --   --  114*  CO2  18*  --   --  18*  GLUCOSE 96  --   --  85  BUN 50*  --   --  49*  CREATININE 7.15*  --   --  7.21*  CALCIUM 8.3*  --   --  8.0*  MG  --  1.8 1.9  --   PHOS  --   --  6.1*  --    GFR: Estimated Creatinine Clearance: 8.2 mL/min (A) (by C-G formula based on SCr of 7.21 mg/dL (H)). Liver Function Tests: Recent Labs  Lab 03/05/23 0408  AST 11*  ALT 15  ALKPHOS 185*  BILITOT 0.5  PROT 5.8*  ALBUMIN 2.6*   No results for input(s): "LIPASE", "AMYLASE" in the last 168 hours. No results for input(s): "AMMONIA" in the last 168 hours. Coagulation Profile: No results for input(s): "INR", "PROTIME" in the last 168 hours. Cardiac Enzymes: No results for input(s): "CKTOTAL", "CKMB", "CKMBINDEX", "TROPONINI" in the last 168 hours. BNP (last 3 results) No results for input(s): "PROBNP" in the last 8760 hours. HbA1C: No results for input(s): "HGBA1C" in the last 72 hours. CBG: No results for input(s): "GLUCAP" in the last 168 hours. Lipid Profile: No results for input(s): "CHOL", "HDL", "LDLCALC", "TRIG", "CHOLHDL", "LDLDIRECT" in the last 72 hours. Thyroid Function Tests: Recent Labs    03/04/23 1638  TSH 2.381   Anemia Panel: Recent Labs    03/05/23 0935  TIBC 179*  IRON 39   Sepsis Labs: No results for input(s): "PROCALCITON", "LATICACIDVEN" in the last 168 hours.  No results found for this or any previous visit (from the past 240 hour(s)).       Radiology Studies: US RENAL  Result Date: 03/05/2023 CLINICAL DATA:  [id number] AKI (acute kidney injury) ([place]) [id number] EXAM: RENAL / URINARY TRACT ULTRASOUND COMPLETE COMPARISON:  CT 02/14/2022 FINDINGS: Right Kidney: Renal measurements: 8.1 x 4.0 x 3.3 cm = volume: 55.14 mL. Increased renal cortical echogenicity. No hydronephrosis. Left Kidney: Renal measurements: 8.1 x 4.9 x 4.2 cm = volume: 85.61 mL. Increased renal cortical echogenicity. No hydronephrosis. Bladder: Mildly distended. Other: None. IMPRESSION: Increased renal  cortical echogenicity bilaterally, as can be seen in medical renal disease. No hydronephrosis. Electronically Signed   By: Maurine Simmering M.D.   On: 03/05/2023 11:56   DG Chest Port 1 View  Result Date: 03/04/2023 CLINICAL DATA:  Shortness of breath.  Irregular heartbeat. EXAM: PORTABLE CHEST 1 VIEW COMPARISON:  Radiographs 06/05/2013.  Abdominal CT 02/14/2022. FINDINGS: 1419 hours. The heart size is at the upper limits of normal for portable AP technique. Probable mild atelectasis at both lung bases. No edema, confluent airspace opacity, pleural effusion or pneumothorax identified. No acute osseous findings are seen. There are mild degenerative changes in the spine. Telemetry leads overlie the chest. IMPRESSION: Borderline heart size with probable mild bibasilar atelectasis. No edema or confluent airspace opacity. Electronically  Signed   By: Richardean Sale M.D.   On: 03/04/2023 14:26        Scheduled Meds:  apixaban  5 mg Oral BID   atorvastatin  10 mg Oral Daily   calcitRIOL  0.25 mcg Oral Daily   darbepoetin (ARANESP) injection - DIALYSIS  100 mcg Subcutaneous Q Tue-1800   diltiazem  10 mg Intravenous Once   sevelamer carbonate  800 mg Oral TID WC   sodium bicarbonate  650 mg Oral BID   Continuous Infusions:  diltiazem (CARDIZEM) infusion       LOS: 1 day    Time spent: 35 minutes    Jacia Sickman Darleen Crocker, DO [facility]  If 7PM-7AM, please contact night-coverage [email or web address] 03/05/2023, 1:35 PM

## 2023-03-05 NOTE — Progress Notes (Signed)
[place] for heparin >> apixaban Indication: atrial fibrillation   Allergies  Allergen Reactions   Ibuprofen Other (See Comments)    pcp said raises her BP    Patient Measurements: Height: '5\' 2"'$  (157.5 cm) Weight: 93 kg (205 lb 0.4 oz) IBW/kg (Calculated) : 50.1 Heparin Dosing Weight: 72kg  Vital Signs: Temp: 98.6 F (37 C) (03/12 0445) Temp Source: Oral (03/11 2340) BP: 141/84 (03/12 0445) Pulse Rate: 80 (03/12 0445)  Labs: Recent Labs    03/04/23 1357 03/04/23 1637 03/04/23 2342 03/05/23 0408  HGB 9.3*  --   --  8.3*  HCT 29.3*  --   --  27.0*  PLT 284  --   --  266  HEPARINUNFRC  --   --  0.51  --   CREATININE 7.15*  --   --  7.21*  TROPONINIHS 19* 18*  --   --      Estimated Creatinine Clearance: 8.2 mL/min (A) (by C-G formula based on SCr of 7.21 mg/dL (H)).  Assessment: 67 y.o. female with new onset Afib for heparin  Goal of Therapy:  Heparin level 0.3-0.7 units/ml Monitor platelets by anticoagulation protocol: Yes   Plan:  Stop heparin infusion Start apixaban 5 mg twice daily. Monitor H&H and platelets.  Margot Ables, PharmD Clinical Pharmacist 03/05/2023 7:43 AM

## 2023-03-05 NOTE — Consult Note (Signed)
Reason for Consult: AKI/CKD stage IV Referring Physician: Donald Prose Patricia Wu is an 67 y.o. female with a PMH significant for HTN, HLD, and CKD stage IV who was seen in our office yesterday and was found to have A fib with RVR and was directed to go to [facility] ED for evaluation.  In the ED, HR 130, BP157/94, SpO2 100%.  Labs notable for BUN 50, Cr 7.15 (baseline had been 3.1-4), Hgb 9.3.  She was admitted for cardiac workup and we were consulted due to the development of AKI/CKD stage IV.    She denies any N/V/D, dysgeusia, anorexia, or malaise.  She has had some lower extremity edema, but denies any SOB, orthopnea, or PND.  She did note some chest tightness with exertion.  Trend in Creatinine:  Creatinine, Ser  Date/Time Value Ref Range Status  03/05/2023 04:08 AM 7.21 (H) 0.44 - 1.00 mg/dL Final  03/04/2023 01:57 PM 7.15 (H) 0.44 - 1.00 mg/dL Final  07/11/2022 11:38 AM 3.25 (H) 0.40 - 1.20 mg/dL Final  02/28/2022 12:21 PM 3.13 (H) 0.40 - 1.20 mg/dL Final  02/16/2022 05:23 AM 3.60 (H) 0.44 - 1.00 mg/dL Final  02/15/2022 06:19 AM 3.88 (H) 0.44 - 1.00 mg/dL Final  02/15/2022 01:02 AM 4.30 (H) 0.44 - 1.00 mg/dL Final  02/14/2022 10:52 PM 4.12 (H) 0.44 - 1.00 mg/dL Final  02/14/2022 09:19 AM 3.83 (H) 0.40 - 1.20 mg/dL Final  08/09/2021 08:53 AM 1.98 (H) 0.40 - 1.20 mg/dL Final  02/08/2021 09:50 AM 1.90 (H) 0.57 - 1.00 mg/dL Final  09/22/2020 10:09 AM 2.13 (H) 0.57 - 1.00 mg/dL Final  08/10/2020 09:19 AM 2.11 (H) 0.57 - 1.00 mg/dL Final  02/17/2020 09:04 AM 1.77 (H) 0.57 - 1.00 mg/dL Final  02/10/2020 08:41 AM 2.38 (H) 0.57 - 1.00 mg/dL Final  08/10/2019 03:35 PM 1.62 (H) 0.57 - 1.00 mg/dL Final  07/05/2018 12:02 PM 1.86 (H) 0.57 - 1.00 mg/dL Final  03/05/2018 12:24 PM 1.78 (H) 0.57 - 1.00 mg/dL Final  02/18/2018 05:53 PM 1.76 (H) 0.57 - 1.00 mg/dL Final  02/01/2018 12:25 PM 1.56 (H) 0.57 - 1.00 mg/dL Final  11/09/2015 01:17 PM 1.47 (H) 0.44 - 1.00 mg/dL Final  06/05/2013 10:13 PM 1.13  (H) 0.50 - 1.10 mg/dL Final  08/02/2009 05:20 PM 0.92 0.4 - 1.2 mg/dL Final    PMH:   Past Medical History:  Diagnosis Date   CKD (chronic kidney disease)    Hypercholesteremia    Hypertension    Noncompliance with medication regimen     PSH:   Past Surgical History:  Procedure Laterality Date   COLONOSCOPY WITH PROPOFOL N/A 10/17/2020   Procedure: COLONOSCOPY WITH PROPOFOL;  Surgeon: Eloise Harman, DO;  Location: [facility];  Service: Endoscopy;  Laterality: N/A;  1:00pm   POLYPECTOMY  10/17/2020   Procedure: POLYPECTOMY;  Surgeon: Eloise Harman, DO;  Location: [facility];  Service: Endoscopy;;    Allergies:  Allergies  Allergen Reactions   Ibuprofen Other (See Comments)    pcp said raises her BP    Medications:   Prior to Admission medications   Medication Sig Start Date End Date Taking? Authorizing Provider  albuterol (VENTOLIN HFA) 108 (90 Base) MCG/ACT inhaler Inhale 1-2 puffs into the lungs every 6 (six) hours as needed for wheezing or shortness of breath. 01/04/23  Yes Wendie Agreste, MD  amLODipine (NORVASC) 10 MG tablet Take 1 tablet (10 mg total) by mouth daily. 01/16/23  Yes Wendie Agreste,  MD  atorvastatin (LIPITOR) 10 MG tablet TAKE 1 TABLET(10 MG) BY MOUTH DAILY 01/16/23  Yes Wendie Agreste, MD  calcitRIOL (ROCALTROL) 0.25 MCG capsule Take 0.25 mcg by mouth daily.   Yes [provider]  sodium bicarbonate 650 MG tablet Take 650 mg by mouth 2 (two) times daily.   Yes [provider]  acetaminophen (TYLENOL) 325 MG tablet Take 325-650 mg by mouth every 6 (six) hours as needed (pain.).     [provider]  furosemide (LASIX) 20 MG tablet Take 0.5 tablets (10 mg total) by mouth daily as needed. For 2-3 days of needed for leg swelling. Patient not taking: Reported on 09/26/2022 07/11/22   Wendie Agreste, MD    Inpatient medications:  amLODipine  10 mg Oral Daily   apixaban  5 mg Oral BID   atorvastatin  10 mg Oral  Daily   calcitRIOL  0.25 mcg Oral Daily   metoprolol tartrate  25 mg Oral Q6H   sodium bicarbonate  650 mg Oral BID    Discontinued Meds:   Medications Discontinued During This Encounter  Medication Reason   diltiazem (CARDIZEM) injection 10 mg    Cholecalciferol (VITAMIN D) 125 MCG (5000 UT) CAPS Patient Preference   albuterol (VENTOLIN HFA) 108 (90 Base) MCG/ACT inhaler 1-2 puff Inpatient Standard   0.45 % sodium chloride infusion    heparin ADULT infusion 100 units/mL (25000 units/230m)    apixaban (ELIQUIS) tablet 5 mg     Social History:  reports that she has never smoked. She has never used smokeless tobacco. She reports that she does not drink alcohol and does not use drugs.  Family History:   Family History  Problem Relation Age of Onset   Liver cancer Neg Hx    Colon cancer Neg Hx     Pertinent items are noted in HPI. Weight change:   Intake/Output Summary (Last 24 hours) at 03/05/2023 0922 Last data filed at 03/05/2023 0500 Gross per 24 hour  Intake 1363.95 ml  Output --  Net 1363.95 ml   BP (!) 141/84 (BP Location: Left Arm)   Pulse 80   Temp 98.6 F (37 C)   Resp 18   Ht '5\' 2"'$  (1.575 m)   Wt 93 kg   SpO2 98%   BMI 37.50 kg/m  Vitals:   03/04/23 1701 03/04/23 1919 03/04/23 2340 03/05/23 0445  BP: (!) 152/107 (!) 143/98 104/82 (!) 141/84  Pulse: (!) 109 92 93 80  Resp: (!) '24 20 18 18  '$ Temp: 98.5 F (36.9 C) 97.7 F (36.5 C) 97.8 F (36.6 C) 98.6 F (37 C)  TempSrc: Oral Oral Oral   SpO2: 100% 99% 100% 98%  Weight:      Height:         General appearance: alert, cooperative, and no distress Head: Normocephalic, without obvious abnormality, atraumatic Resp: clear to auscultation bilaterally Cardio: regularly irregular rhythm GI: soft, non-tender; bowel sounds normal; no masses,  no organomegaly Extremities: edema trace pretibial edema  Labs: Basic Metabolic Panel: Recent Labs  Lab 03/04/23 1357 03/04/23 1637 03/05/23 0408  NA 144  --   142  K 3.6  --  3.3*  CL 114*  --  114*  CO2 18*  --  18*  GLUCOSE 96  --  85  BUN 50*  --  49*  CREATININE 7.15*  --  7.21*  ALBUMIN  --   --  2.6*  CALCIUM 8.3*  --  8.0*  PHOS  --  6.1*  --    Liver Function Tests: Recent Labs  Lab 03/05/23 0408  AST 11*  ALT 15  ALKPHOS 185*  BILITOT 0.5  PROT 5.8*  ALBUMIN 2.6*   No results for input(s): "LIPASE", "AMYLASE" in the last 168 hours. No results for input(s): "AMMONIA" in the last 168 hours. CBC: Recent Labs  Lab 02/27/23 0838 03/04/23 1357 03/05/23 0408  WBC  --  7.9 7.3  HGB 8.3* 9.3* 8.3*  HCT  --  29.3* 27.0*  MCV  --  92.4 94.1  PLT  --  284 266   PT/INR: '@LABRCNTIP'$ (inr:5) Cardiac Enzymes: )No results for input(s): "CKTOTAL", "CKMB", "CKMBINDEX", "TROPONINI" in the last 168 hours. CBG: No results for input(s): "GLUCAP" in the last 168 hours.  Iron Studies: No results for input(s): "IRON", "TIBC", "TRANSFERRIN", "FERRITIN" in the last 168 hours.  Xrays/Other Studies: DG Chest Port 1 View  Result Date: 03/04/2023 CLINICAL DATA:  Shortness of breath.  Irregular heartbeat. EXAM: PORTABLE CHEST 1 VIEW COMPARISON:  Radiographs 06/05/2013.  Abdominal CT 02/14/2022. FINDINGS: 1419 hours. The heart size is at the upper limits of normal for portable AP technique. Probable mild atelectasis at both lung bases. No edema, confluent airspace opacity, pleural effusion or pneumothorax identified. No acute osseous findings are seen. There are mild degenerative changes in the spine. Telemetry leads overlie the chest. IMPRESSION: Borderline heart size with probable mild bibasilar atelectasis. No edema or confluent airspace opacity. Electronically Signed   By: Richardean Sale M.D.   On: 03/04/2023 14:26     Assessment/Plan:  AKI/CKD stage IV - had been very stable with Scr 3.1-4 for the past year but jumped to 7.21.  No uremic symptoms, however she was close to initiating dialysis and is scheduled for AVG placement by Dr. Donnetta Hutching on  03/12/23.  If she needs an heart cath, she will likely need to start dialysis afterwards.  May need to have [facility] and AVG placement during this admission if BUN/Cr continue to worsen.   Avoid nephrotoxic medications including NSAIDs and iodinated intravenous contrast exposure unless the latter is absolutely indicated.   Preferred narcotic agents for pain control are hydromorphone, fentanyl, and methadone. Morphine should not be used.  Avoid Baclofen and avoid oral sodium phosphate and magnesium citrate based laxatives / bowel preps.  Continue strict Input and Output monitoring.  Will monitor the patient closely with you and intervene or adjust therapy as indicated by changes in clinical status/labs  New onset atrial fibrillation with RVR - started on heparin drip and metoprolol with improved rate control.  Cardiology following.  ECHO pending.  Further workup per Cardiology.  HTN - stable Anemia of CKD stage IV-V - will check iron stores and will start ESA HLD - on atorvastatin CKD-BMD - hyperphosphatemia noted.  Will start binders and follow.  Hypokalemia - replete gently given AKI/CKD stage IV-V Vascular access - was planning for AVG placement on 03/12/23 by Dr. Donnetta Hutching, however may require [facility] and HD if renal function continues to worsen.     Patricia Wu A Christeena Krogh 03/05/2023, 9:22 AM

## 2023-03-05 NOTE — Progress Notes (Signed)
[place] for heparin Indication: atrial fibrillation Brief A/P: Heparin level within goal range Continue Heparin at current rate  Allergies  Allergen Reactions   Ibuprofen Other (See Comments)    pcp said raises her BP    Patient Measurements: Height: '5\' 2"'$  (157.5 cm) Weight: 93 kg (205 lb 0.4 oz) IBW/kg (Calculated) : 50.1 Heparin Dosing Weight: 72kg  Vital Signs: Temp: 97.8 F (36.6 C) (03/11 2340) Temp Source: Oral (03/11 2340) BP: 104/82 (03/11 2340) Pulse Rate: 93 (03/11 2340)  Labs: Recent Labs    03/04/23 1357 03/04/23 1637 03/04/23 2342  HGB 9.3*  --   --   HCT 29.3*  --   --   PLT 284  --   --   HEPARINUNFRC  --   --  0.51  CREATININE 7.15*  --   --   TROPONINIHS 19* 18*  --     Estimated Creatinine Clearance: 8.2 mL/min (A) (by C-G formula based on SCr of 7.15 mg/dL (H)).  Assessment: 67 y.o. female with new onset Afib for heparin  Goal of Therapy:  Heparin level 0.3-0.7 units/ml Monitor platelets by anticoagulation protocol: Yes   Plan:  No change to heparin  F/U switch to [place], PharmD, BCPS  03/05/2023 12:36 AM

## 2023-03-06 DIAGNOSIS — I4891 Unspecified atrial fibrillation: Secondary | ICD-10-CM | POA: Diagnosis not present

## 2023-03-06 DIAGNOSIS — I10 Essential (primary) hypertension: Secondary | ICD-10-CM | POA: Diagnosis not present

## 2023-03-06 DIAGNOSIS — D649 Anemia, unspecified: Secondary | ICD-10-CM | POA: Diagnosis not present

## 2023-03-06 DIAGNOSIS — I129 Hypertensive chronic kidney disease with stage 1 through stage 4 chronic kidney disease, or unspecified chronic kidney disease: Secondary | ICD-10-CM | POA: Diagnosis not present

## 2023-03-06 DIAGNOSIS — I429 Cardiomyopathy, unspecified: Secondary | ICD-10-CM

## 2023-03-06 DIAGNOSIS — N184 Chronic kidney disease, stage 4 (severe): Secondary | ICD-10-CM | POA: Diagnosis not present

## 2023-03-06 DIAGNOSIS — Z01810 Encounter for preprocedural cardiovascular examination: Secondary | ICD-10-CM

## 2023-03-06 DIAGNOSIS — N250 Renal osteodystrophy: Secondary | ICD-10-CM | POA: Diagnosis not present

## 2023-03-06 DIAGNOSIS — N179 Acute kidney failure, unspecified: Secondary | ICD-10-CM | POA: Diagnosis not present

## 2023-03-06 DIAGNOSIS — E876 Hypokalemia: Secondary | ICD-10-CM | POA: Diagnosis not present

## 2023-03-06 LAB — MAGNESIUM: Magnesium: 1.9 mg/dL (ref 1.7–2.4)

## 2023-03-06 LAB — RENAL FUNCTION PANEL
Albumin: 2.5 g/dL — ABNORMAL LOW (ref 3.5–5.0)
Anion gap: 11 (ref 5–15)
BUN: 50 mg/dL — ABNORMAL HIGH (ref 8–23)
CO2: 19 mmol/L — ABNORMAL LOW (ref 22–32)
Calcium: 8.3 mg/dL — ABNORMAL LOW (ref 8.9–10.3)
Chloride: 113 mmol/L — ABNORMAL HIGH (ref 98–111)
Creatinine, Ser: 7.25 mg/dL — ABNORMAL HIGH (ref 0.44–1.00)
GFR, Estimated: 6 mL/min — ABNORMAL LOW (ref >60)
Glucose, Bld: 103 mg/dL — ABNORMAL HIGH (ref 70–99)
Phosphorus: 6.7 mg/dL — ABNORMAL HIGH (ref 2.5–4.6)
Potassium: 3.9 mmol/L (ref 3.5–5.1)
Sodium: 143 mmol/L (ref 135–145)

## 2023-03-06 LAB — CBC
HCT: 26.6 % — ABNORMAL LOW (ref 36.0–46.0)
Hemoglobin: 8.1 g/dL — ABNORMAL LOW (ref 12.0–15.0)
MCH: 28.3 pg (ref 26.0–34.0)
MCHC: 30.5 g/dL (ref 30.0–36.0)
MCV: 93 fL (ref 80.0–100.0)
Platelets: 273 10*3/uL (ref 150–400)
RBC: 2.86 MIL/uL — ABNORMAL LOW (ref 3.87–5.11)
RDW: 16.3 % — ABNORMAL HIGH (ref 11.5–15.5)
WBC: 7.3 10*3/uL (ref 4.0–10.5)
nRBC: 0 % (ref 0.0–0.2)

## 2023-03-06 MED ORDER — METOPROLOL TARTRATE 50 MG PO TABS: 50.0000 mg | ORAL_TABLET | Freq: Two times a day (BID) | ORAL | Status: DC

## 2023-03-06 MED ORDER — METOPROLOL TARTRATE 50 MG PO TABS: 50.0000 mg | ORAL_TABLET | Freq: Two times a day (BID) | ORAL | 1 refills | Status: DC

## 2023-03-06 MED ORDER — APIXABAN 5 MG PO TABS: 5.0000 mg | ORAL_TABLET | Freq: Two times a day (BID) | ORAL | 1 refills | Status: DC

## 2023-03-06 NOTE — Pre-Procedure Instructions (Signed)
[facility] sent to [facility] to let them know about patient's recent hospital stay.

## 2023-03-06 NOTE — Progress Notes (Signed)
[place] Discharge Note  Admit Date:  03/04/2023 Discharge date: 03/06/2023   Patricia Wu to be D/C'd Home per MD order.  AVS completed. Patient/caregiver able to verbalize understanding.  Discharge Medication: Allergies as of 03/06/2023       Reactions   Ibuprofen Other (See Comments)   pcp said raises her BP        Medication List     STOP taking these medications    furosemide 20 MG tablet Commonly known as: LASIX       TAKE these medications    acetaminophen 325 MG tablet Commonly known as: TYLENOL Take 325-650 mg by mouth every 6 (six) hours as needed (pain.).   albuterol 108 (90 Base) MCG/ACT inhaler Commonly known as: VENTOLIN HFA Inhale 1-2 puffs into the lungs every 6 (six) hours as needed for wheezing or shortness of breath.   amLODipine 10 MG tablet Commonly known as: NORVASC Take 1 tablet (10 mg total) by mouth daily.   apixaban 5 MG Tabs tablet Commonly known as: ELIQUIS Take 1 tablet (5 mg total) by mouth 2 (two) times daily.   atorvastatin 10 MG tablet Commonly known as: LIPITOR TAKE 1 TABLET(10 MG) BY MOUTH DAILY   calcitRIOL 0.25 MCG capsule Commonly known as: ROCALTROL Take 0.25 mcg by mouth daily.   metoprolol tartrate 50 MG tablet Commonly known as: LOPRESSOR Take 1 tablet (50 mg total) by mouth 2 (two) times daily.   sodium bicarbonate 650 MG tablet Take 650 mg by mouth 2 (two) times daily.        Discharge Assessment: Vitals:   03/06/23 0026 03/06/23 0559  BP: 132/79 106/61  Pulse: 95 97  Resp: 20 18  Temp: 98.2 F (36.8 C) 98.7 F (37.1 C)  SpO2: 95% 97%   Skin clean, dry and intact without evidence of skin break down, no evidence of skin tears noted. IV catheter discontinued intact. Site without signs and symptoms of complications - no redness or edema noted at insertion site, patient denies c/o pain - only slight tenderness at site.  Dressing with slight pressure applied.  D/c Instructions-Education: Discharge  instructions given to patient/family with verbalized understanding. D/c education completed with patient/family including follow up instructions, medication list, d/c activities limitations if indicated, with other d/c instructions as indicated by MD - patient able to verbalize understanding, all questions fully answered. Patient instructed to return to ED, call 911, or call MD for any changes in condition.  Patient escorted via [place], and D/C home via private auto.  Tsosie Billing, LPN [id number] AM

## 2023-03-06 NOTE — Care Management Important Message (Signed)
Important Message  Patient Details  Name: Patricia Wu MRN: [id number]:[id number] Date of Birth: 1956/11/02   Medicare Important Message Given:  Yes     [name] 03/06/2023, 12:05 PM

## 2023-03-06 NOTE — Progress Notes (Signed)
Progress Note  Patient Name: Patricia Wu Date of Encounter: 03/06/2023  Primary Cardiologist: Kate Sable, MD (Inactive)  Subjective   No acute events overnight, patient has been in persistent atrial fibrillation with heart rates much controlled after increasing the metoprolol tartrate dose from 25 mg to 50 mg. No symptoms, SOB, feels fatigue. Leg swelling improved.  Inpatient Medications    Scheduled Meds:  amLODipine  10 mg Oral Daily   apixaban  5 mg Oral BID   atorvastatin  10 mg Oral Daily   calcitRIOL  0.25 mcg Oral Daily   darbepoetin (ARANESP) injection - DIALYSIS  100 mcg Subcutaneous Q Tue-1800   metoprolol tartrate  50 mg Oral Q6H   sevelamer carbonate  800 mg Oral TID WC   sodium bicarbonate  650 mg Oral BID   Continuous Infusions:  PRN Meds: acetaminophen **OR** acetaminophen, albuterol, ondansetron **OR** ondansetron (ZOFRAN) IV   Vital Signs    Vitals:   03/05/23 2033 03/06/23 0025 03/06/23 0026 03/06/23 0559  BP: 139/80 132/79 132/79 106/61  Pulse: 96 95 95 97  Resp: '20  20 18  '$ Temp: 98.1 F (36.7 C)  98.2 F (36.8 C) 98.7 F (37.1 C)  TempSrc: Oral  Oral Oral  SpO2: 99%  95% 97%  Weight:      Height:        Intake/Output Summary (Last 24 hours) at 03/06/2023 1016 Last data filed at 03/05/2023 1833 Gross per 24 hour  Intake 480 ml  Output --  Net 480 ml   Filed Weights   03/04/23 1435 03/04/23 1700  Weight: 92.5 kg 93 kg    Telemetry     Personally reviewed, persistent atrial fibrillation, heart rates much controlled between 70 to 90 bpm.  ECG    Not performed today  Physical Exam   GEN: No acute distress.   Neck: No JVD. Cardiac: RRR, no murmur, rub, or gallop.  Respiratory: Nonlabored. Clear to auscultation bilaterally. GI: Soft, nontender, bowel sounds present. MS: Trace pitting edema in right lower extremity, improved since admission; No deformity. Neuro:  Nonfocal. Psych: Alert and oriented x 3. Normal  affect.  Labs    Chemistry Recent Labs  Lab 03/04/23 1357 03/05/23 0408 03/06/23 0406  NA 144 142 143  K 3.6 3.3* 3.9  CL 114* 114* 113*  CO2 18* 18* 19*  GLUCOSE 96 85 103*  BUN 50* 49* 50*  CREATININE 7.15* 7.21* 7.25*  CALCIUM 8.3* 8.0* 8.3*  PROT  --  5.8*  --   ALBUMIN  --  2.6* 2.5*  AST  --  11*  --   ALT  --  15  --   ALKPHOS  --  185*  --   BILITOT  --  0.5  --   GFRNONAA 6* 6* 6*  ANIONGAP '12 10 11     '$ Hematology Recent Labs  Lab 03/04/23 1357 03/05/23 0408 03/06/23 0406  WBC 7.9 7.3 7.3  RBC 3.17* 2.87* 2.86*  HGB 9.3* 8.3* 8.1*  HCT 29.3* 27.0* 26.6*  MCV 92.4 94.1 93.0  MCH 29.3 28.9 28.3  MCHC 31.7 30.7 30.5  RDW 15.9* 15.9* 16.3*  PLT 284 266 273    Cardiac Enzymes Recent Labs  Lab 03/04/23 1357 03/04/23 1637  TROPONINIHS 19* 18*    BNPNo results for input(s): "BNP", "PROBNP" in the last 168 hours.   DDimerNo results for input(s): "DDIMER" in the last 168 hours.   Radiology    ECHOCARDIOGRAM COMPLETE  Result Date:  03/05/2023    ECHOCARDIOGRAM REPORT   Patient Name:   Patricia Wu [facility] Date of Exam: 03/05/2023 Medical Rec #:  [id number]       Height:       62.0 in Accession #:    [id number]      Weight:       205.0 lb Date of Birth:  17-Aug-1956      BSA:          1.932 m Patient Age:    67 years        BP:           117/66 mmHg Patient Gender: F               HR:           70 bpm. Exam Location:  [facility] Procedure: 2D Echo, Cardiac Doppler and Color Doppler Indications:    Atrial Fibrillation I48.91  History:        Patient has prior history of Echocardiogram examinations, most                 recent 05/02/2018. Risk Factors:Hypertension and Dyslipidemia.  Sonographer:    Alvino Chapel RCS Referring Phys: [id number] [place]  1. Left ventricular ejection fraction, by estimation, is 40 to 45%. The left ventricle has mildly decreased function. The left ventricle demonstrates global hypokinesis. There is moderate left ventricular  hypertrophy. Left ventricular diastolic function  could not be evaluated.  2. Right ventricular systolic function is normal. The right ventricular size is normal. There is normal pulmonary artery systolic pressure. The estimated right ventricular systolic pressure is Q000111Q mmHg.  3. Right atrial size was moderately dilated.  4. The mitral valve is normal in structure. Mild mitral valve regurgitation. No evidence of mitral stenosis.  5. The aortic valve is tricuspid. There is mild calcification of the aortic valve. Aortic valve regurgitation is not visualized. No aortic stenosis is present.  6. The inferior vena cava is normal in size with greater than 50% respiratory variability, suggesting right atrial pressure of 3 mmHg. Comparison(s): No prior Echocardiogram. FINDINGS  Left Ventricle: Left ventricular ejection fraction, by estimation, is 40 to 45%. The left ventricle has mildly decreased function. The left ventricle demonstrates global hypokinesis. The left ventricular internal cavity size was normal in size. There is  moderate left ventricular hypertrophy. Left ventricular diastolic function could not be evaluated due to atrial fibrillation. Left ventricular diastolic function could not be evaluated. Right Ventricle: The right ventricular size is normal. No increase in right ventricular wall thickness. Right ventricular systolic function is normal. There is normal pulmonary artery systolic pressure. The tricuspid regurgitant velocity is 2.50 m/s, and  with an assumed right atrial pressure of 3 mmHg, the estimated right ventricular systolic pressure is Q000111Q mmHg. Left Atrium: Left atrial size was normal in size. Right Atrium: Right atrial size was moderately dilated. Pericardium: Trivial pericardial effusion is present. Mitral Valve: The mitral valve is normal in structure. Mild mitral valve regurgitation. No evidence of mitral valve stenosis. Tricuspid Valve: The tricuspid valve is normal in structure. Tricuspid  valve regurgitation is trivial. No evidence of tricuspid stenosis. Aortic Valve: The aortic valve is tricuspid. There is mild calcification of the aortic valve. Aortic valve regurgitation is not visualized. No aortic stenosis is present. Pulmonic Valve: The pulmonic valve was normal in structure. Pulmonic valve regurgitation is trivial. No evidence of pulmonic stenosis. Aorta: The aortic root is normal in size and structure. Venous: The  inferior vena cava is normal in size with greater than 50% respiratory variability, suggesting right atrial pressure of 3 mmHg. IAS/Shunts: No atrial level shunt detected by color flow Doppler.  LEFT VENTRICLE PLAX 2D LVIDd:         5.05 cm LVIDs:         3.90 cm LV PW:         1.30 cm LV IVS:        1.40 cm LVOT diam:     1.90 cm LV SV:         46 LV SV Index:   24 LVOT Area:     2.84 cm  RIGHT VENTRICLE TAPSE (M-mode): 2.1 cm LEFT ATRIUM             Index        RIGHT ATRIUM           Index LA diam:        4.60 cm 2.38 cm/m   RA Area:     22.70 cm LA Vol (A2C):   53.2 ml 27.54 ml/m  RA Volume:   73.80 ml  38.20 ml/m LA Vol (A4C):   64.5 ml 33.39 ml/m LA Biplane Vol: 63.3 ml 32.76 ml/m  AORTIC VALVE LVOT Vmax:   88.93 cm/s LVOT Vmean:  59.233 cm/s LVOT VTI:    0.162 m  AORTA Ao Root diam: 3.70 cm MITRAL VALVE                  TRICUSPID VALVE MV Area (PHT): 5.13 cm       TR Peak grad:   25.0 mmHg MV Decel Time: 148 msec       TR Vmax:        250.00 cm/s MR Peak grad:    72.6 mmHg MR Mean grad:    45.0 mmHg    SHUNTS MR Vmax:         426.00 cm/s  Systemic VTI:  0.16 m MR Vmean:        304.0 cm/s   Systemic Diam: 1.90 cm MR PISA:         1.01 cm MR PISA Eff ROA: 5 mm MR PISA Radius:  0.40 cm MV E velocity: 155.00 cm/s Patricia Wu Electronically signed by Lorelee Cover Deva Ron Signature Date/Time: 03/05/2023/10:12:55 PM    Final    US RENAL  Result Date: 03/05/2023 CLINICAL DATA:  [id number] AKI (acute kidney injury) ([place]) [id number] EXAM: RENAL / URINARY TRACT  ULTRASOUND COMPLETE COMPARISON:  CT 02/14/2022 FINDINGS: Right Kidney: Renal measurements: 8.1 x 4.0 x 3.3 cm = volume: 55.14 mL. Increased renal cortical echogenicity. No hydronephrosis. Left Kidney: Renal measurements: 8.1 x 4.9 x 4.2 cm = volume: 85.61 mL. Increased renal cortical echogenicity. No hydronephrosis. Bladder: Mildly distended. Other: None. IMPRESSION: Increased renal cortical echogenicity bilaterally, as can be seen in medical renal disease. No hydronephrosis. Electronically Signed   By: Maurine Simmering M.D.   On: 03/05/2023 11:56   DG Chest Port 1 View  Result Date: 03/04/2023 CLINICAL DATA:  Shortness of breath.  Irregular heartbeat. EXAM: PORTABLE CHEST 1 VIEW COMPARISON:  Radiographs 06/05/2013.  Abdominal CT 02/14/2022. FINDINGS: 1419 hours. The heart size is at the upper limits of normal for portable AP technique. Probable mild atelectasis at both lung bases. No edema, confluent airspace opacity, pleural effusion or pneumothorax identified. No acute osseous findings are seen. There are mild degenerative changes in the spine. Telemetry leads overlie the chest. IMPRESSION: Borderline heart size  with probable mild bibasilar atelectasis. No edema or confluent airspace opacity. Electronically Signed   By: Richardean Sale M.D.   On: 03/04/2023 14:26    Assessment & Plan   Patient is a 67 year old F known to have HTN, HLD, CKD was sent from nephrology clinic for evaluation of irregular HR. EKG showed atrial fibrillation, intermittent RVR. Telemetry reviewed, continues to be in atrial fibrillation, heart rate is controlled between 70-90.  # New onset atrial fibrillation with RVR, rate controlled -Telemetry reviewed, showed atrial fibrillation persistent, heart rates between 70 to 90 bpm. She continues to deny palpitations but has fatigue.  She received metoprolol tartrate 25 mg in a.m. and 50 mg in the p.m yesterday with adequate control in the heart rates. Switch metoprolol tartrate regimen to  50 mg twice daily. Echocardiogram on admission showed LVEF 40 to 45% which could be secondary to atrial fibrillation with intermittent RVR however CAD cannot be ruled out. Due to cardiomyopathy new onset with LVEF 40 to 45% and persistent atrial fibrillation, she will benefit from rhythm control strategy with DCCV in the future. She will have to be on 4 weeks of uninterrupted anticoagulation prior to undergoing DCCV. As she has L arm AV graft procedure scheduled on 03/12/2023, will start the 4-week anticoagulation clock after the procedure when she will be seen in the cardiology clinic on 03/21/2023 and then schedule for DCCV. -Outpatient OSA evaluation  # New onset cardiomyopathy LVEF 40 to 45% (likely secondary to atrial fibrillation, however CAD cannot be ruled out) -Switch metoprolol tartrate regimen to metoprolol tartrate 50 mg twice daily -Not on ACE/ARB/MRA/SGLT2 inhibitors due to CKD stage IV-V  # Preop cardiac risk stratification prior to L AV graft procedure -Patient has new onset atrial fibrillation with intermittent RVR, currently rate controlled with metoprolol tartrate 50 mg twice daily. EKG shows atrial fibrillation, rate controlled.  Echo showed new onset cardiomyopathy, LVEF 40 to 45% and CVP 3 mmHg. She has no angina or DOE (CVP 3 mmHg on echo, normal). Patient is at an intermediate risk of any perioperative cardiac complications. No further cardiac testing is indicated prior to proceeding with the planned procedure. -Eliquis will need to be held 2 to 3 days prior to the L AV graft procedure and can be resumed whenever safe from the bleeding standpoint.   [facility] will sign off.   Medication Recommendations: P.o. metoprolol tartrate 50 mg twice daily, Eliquis 5 mg twice daily Other recommendations (labs, testing, etc): None Follow up as an outpatient: Cardiology outpatient follow-up on 03/21/2023 with Finis Bud; pulmonary referral for OSA evaluation (due to new onset  A-fib).   I have spent a total of 33 minutes with patient reviewing chart, EKGs, labs and examining patient as well as establishing an assessment and plan that was discussed with the patient.  > 50% of time was spent in direct patient care.     Signed, Chalmers Guest, MD  03/06/2023, 10:16 AM

## 2023-03-06 NOTE — Progress Notes (Signed)
Mobility Specialist Progress Note:    03/06/23 0900  Mobility  Activity Ambulated with assistance in hallway  Level of Assistance Standby assist, set-up cues, supervision of patient - no hands on  Assistive Device Front wheel walker  Distance Ambulated (ft) 460 ft  Activity Response Tolerated well  Mobility Referral Yes  $Mobility charge 1 Mobility   Pt agreeable to mobility session. Tolerated well, asx throughout. Returned pt to room, all needs met.   Royetta Crochet Mobility Specialist Please contact via Solicitor or  Rehab office at [phone]

## 2023-03-06 NOTE — Discharge Instructions (Signed)
IMPORTANT INFORMATION: PAY CLOSE ATTENTION   PHYSICIAN DISCHARGE INSTRUCTIONS  Follow with Primary care provider  Patricia Agreste, MD  and other consultants as instructed by your Hospitalist Physician  [place] IF SYMPTOMS COME BACK, WORSEN OR NEW PROBLEM DEVELOPS   Please note: You were cared for by a hospitalist during your hospital stay. Every effort will be made to forward records to your primary care provider.  You can request that your primary care provider send for your hospital records if they have not received them.  Once you are discharged, your primary care physician will handle any further medical issues. Please note that NO REFILLS for any discharge medications will be authorized once you are discharged, as it is imperative that you return to your primary care physician (or establish a relationship with a primary care physician if you do not have one) for your post hospital discharge needs so that they can reassess your need for medications and monitor your lab values.  Please get a complete blood count and chemistry panel checked by your Primary MD at your next visit, and again as instructed by your Primary MD.  Get Medicines reviewed and adjusted: Please take all your medications with you for your next visit with your Primary MD  Laboratory/radiological data: Please request your Primary MD to go over all hospital tests and procedure/radiological results at the follow up, please ask your primary care provider to get all Hospital records sent to his/her office.  In some cases, they will be blood work, cultures and biopsy results pending at the time of your discharge. Please request that your primary care provider follow up on these results.  If you are diabetic, please bring your blood sugar readings with you to your follow up appointment with primary care.    Please call and make your follow up appointments as soon as possible.    Also  Note the following: If you experience worsening of your admission symptoms, develop shortness of breath, life threatening emergency, suicidal or homicidal thoughts you must seek medical attention immediately by calling 911 or calling your MD immediately  if symptoms less severe.  You must read complete instructions/literature along with all the possible adverse reactions/side effects for all the Medicines you take and that have been prescribed to you. Take any new Medicines after you have completely understood and accpet all the possible adverse reactions/side effects.   Do not drive when taking Pain medications or sleeping medications (Benzodiazepines)  Do not take more than prescribed Pain, Sleep and Anxiety Medications. It is not advisable to combine anxiety,sleep and pain medications without talking with your primary care practitioner  Special Instructions: If you have smoked or chewed Tobacco  in the last 2 yrs please stop smoking, stop any regular Alcohol  and or any Recreational drug use.  Wear Seat belts while driving.  Do not drive if taking any narcotic, mind altering or controlled substances or recreational drugs or alcohol.

## 2023-03-06 NOTE — Discharge Summary (Signed)
Physician Discharge Summary  Patricia Wu [id number] DOB: 01-Jul-1956 DOA: 03/04/2023  PCP: Wendie Agreste, MD Renal: [facility]: [facility]   Admit date: 03/04/2023 Discharge date: 03/06/2023  Admitted From:  Home  Disposition: Home   Recommendations for Outpatient Follow-up:  Follow up with PCP in 1-2 weeks Please follow up with [facility] on 03/11/23 as arranged Please follow up with cardiology in 1-2 weeks as arranged Please follow up with vascular surgeon on 03/12/23 as arranged Please obtain BMP/CBC in 1 week   Discharge Condition: STABLE   CODE STATUS: FULL DIET: renal / fluid restriction   Brief Hospitalization Summary: Please see all hospital notes, images, labs for full details of the hospitalization. ADMISSION HPI:  67 y.o. female with medical history significant of CKD, HTN, HLD.  Presenting from Nephrologists office after 2 days of SOB and chest pressure. At Nephrologist pt was noted to be in Afib w/ an accelerated rate. Denies wheezing, fever, CP, LE edema above baseline, HA. Endorsing compliance w/ home medical regimen. Nothing makes sx better. Worse w/ ambulation.    In the ED pt was noted to be in Afib w/ RVR. Given Dilt x2 w/ resolution in RVR. Cards consulted for tx.   HOSPITAL COURSE BY PROBLEM   Afib/RVR: New problem for pt. Stable after 2 doses of IV Dilt. Cardiology consulted by EDP and will see on 03/05/23. Trop 19. Asymptomatic at time of admission.  -Appreciate cardiology recommendations with initiation of Eliquis -Started on Cardizem drip and transferred to stepdown ICU, subsequently stabilized and transferred to floor -Pt has been stabilized on metoprolol 50 mg BID  -apixaban 5 mg BID started in hospital -close outpatient follow up with cardiology service has been arranged    AKI on CKD IV: Cr 7.15. Last known Cr 3.5 (06/2022). Pt w/ a regularly established Renal team. Per pt she is being evaluated for AV graft. Makes urine daily.  - Hold Lasix  until am and renal function can be evaluated again outpatient  - BMP in 1 week with nephrology outpatient follow up  -Hold further IV hydration as patient appears hypervolemic -Appreciate further recommendations per nephrology with outpatient follow up for 03/11/23 arranged   Mild hypokalemia: -repleted    HTN: -BPs controlled    Asthma: Stable on RA - continue home Albuterol PRN   HLD: - continue home Lipitor   Obesity: -BMI 37.5  Discharge Diagnoses:  Principal Problem:   Atrial fibrillation with RVR ([place]) Active Problems:   Essential hypertension   Acute-on-chronic kidney injury ([place])   Hyperlipidemia   Chronic kidney disease, stage IV (severe) ([place])   Asthma, chronic   Discharge Instructions: Discharge Instructions     Amb referral to AFIB Clinic   Complete by: As directed    Amb referral to AFIB Clinic   Complete by: As directed       Allergies as of 03/06/2023       Reactions   Ibuprofen Other (See Comments)   pcp said raises her BP        Medication List     STOP taking these medications    furosemide 20 MG tablet Commonly known as: LASIX       TAKE these medications    acetaminophen 325 MG tablet Commonly known as: TYLENOL Take 325-650 mg by mouth every 6 (six) hours as needed (pain.).   albuterol 108 (90 Base) MCG/ACT inhaler Commonly known as: VENTOLIN HFA Inhale 1-2 puffs into the lungs every 6 (six) hours as  needed for wheezing or shortness of breath.   amLODipine 10 MG tablet Commonly known as: NORVASC Take 1 tablet (10 mg total) by mouth daily.   apixaban 5 MG Tabs tablet Commonly known as: ELIQUIS Take 1 tablet (5 mg total) by mouth 2 (two) times daily.   atorvastatin 10 MG tablet Commonly known as: LIPITOR TAKE 1 TABLET(10 MG) BY MOUTH DAILY   calcitRIOL 0.25 MCG capsule Commonly known as: ROCALTROL Take 0.25 mcg by mouth daily.   metoprolol tartrate 50 MG tablet Commonly known as: LOPRESSOR Take 1 tablet (50 mg  total) by mouth 2 (two) times daily.   sodium bicarbonate 650 MG tablet Take 650 mg by mouth 2 (two) times daily.        Follow-up Information     Finis Bud, NP Follow up on 03/21/2023.   Specialty: Cardiology Why: Cardiology Hospital Follow-up on 03/21/2023 at 3:30 PM. Will be in the [facility]. Contact information: [place] [address] [phone]         Wendie Agreste, MD. Schedule an appointment as soon as possible for a visit in 1 week(s).   Specialties: Family Medicine, Sports Medicine Why: Hospital Follow Up Contact information: [place] [id number] [phone]         [employer]. Go to.   Why: Go to scheduled appt on 3/18 as arranged by nephrologist               Allergies  Allergen Reactions   Ibuprofen Other (See Comments)    pcp said raises her BP   Allergies as of 03/06/2023       Reactions   Ibuprofen Other (See Comments)   pcp said raises her BP        Medication List     STOP taking these medications    furosemide 20 MG tablet Commonly known as: LASIX       TAKE these medications    acetaminophen 325 MG tablet Commonly known as: TYLENOL Take 325-650 mg by mouth every 6 (six) hours as needed (pain.).   albuterol 108 (90 Base) MCG/ACT inhaler Commonly known as: VENTOLIN HFA Inhale 1-2 puffs into the lungs every 6 (six) hours as needed for wheezing or shortness of breath.   amLODipine 10 MG tablet Commonly known as: NORVASC Take 1 tablet (10 mg total) by mouth daily.   apixaban 5 MG Tabs tablet Commonly known as: ELIQUIS Take 1 tablet (5 mg total) by mouth 2 (two) times daily.   atorvastatin 10 MG tablet Commonly known as: LIPITOR TAKE 1 TABLET(10 MG) BY MOUTH DAILY   calcitRIOL 0.25 MCG capsule Commonly known as: ROCALTROL Take 0.25 mcg by mouth daily.   metoprolol tartrate 50 MG tablet Commonly known as: LOPRESSOR Take 1 tablet (50 mg total) by  mouth 2 (two) times daily.   sodium bicarbonate 650 MG tablet Take 650 mg by mouth 2 (two) times daily.        Procedures/Studies: ECHOCARDIOGRAM COMPLETE  Result Date: 03/05/2023    ECHOCARDIOGRAM REPORT   Patient Name:   [name] Date of Exam: 03/05/2023 Medical Rec #:  [id number]       Height:       62.0 in Accession #:    [id number]      Weight:       205.0 lb Date of Birth:  March 14, 1956      BSA:  1.932 m Patient Age:    30 years        BP:           117/66 mmHg Patient Gender: F               HR:           70 bpm. Exam Location:  [facility] Procedure: 2D Echo, Cardiac Doppler and Color Doppler Indications:    Atrial Fibrillation I48.91  History:        Patient has prior history of Echocardiogram examinations, most                 recent 05/02/2018. Risk Factors:Hypertension and Dyslipidemia.  Sonographer:    Alvino Chapel RCS Referring Phys: [id number] [place]  1. Left ventricular ejection fraction, by estimation, is 40 to 45%. The left ventricle has mildly decreased function. The left ventricle demonstrates global hypokinesis. There is moderate left ventricular hypertrophy. Left ventricular diastolic function  could not be evaluated.  2. Right ventricular systolic function is normal. The right ventricular size is normal. There is normal pulmonary artery systolic pressure. The estimated right ventricular systolic pressure is Q000111Q mmHg.  3. Right atrial size was moderately dilated.  4. The mitral valve is normal in structure. Mild mitral valve regurgitation. No evidence of mitral stenosis.  5. The aortic valve is tricuspid. There is mild calcification of the aortic valve. Aortic valve regurgitation is not visualized. No aortic stenosis is present.  6. The inferior vena cava is normal in size with greater than 50% respiratory variability, suggesting right atrial pressure of 3 mmHg. Comparison(s): No prior Echocardiogram. FINDINGS  Left Ventricle: Left ventricular ejection  fraction, by estimation, is 40 to 45%. The left ventricle has mildly decreased function. The left ventricle demonstrates global hypokinesis. The left ventricular internal cavity size was normal in size. There is  moderate left ventricular hypertrophy. Left ventricular diastolic function could not be evaluated due to atrial fibrillation. Left ventricular diastolic function could not be evaluated. Right Ventricle: The right ventricular size is normal. No increase in right ventricular wall thickness. Right ventricular systolic function is normal. There is normal pulmonary artery systolic pressure. The tricuspid regurgitant velocity is 2.50 m/s, and  with an assumed right atrial pressure of 3 mmHg, the estimated right ventricular systolic pressure is Q000111Q mmHg. Left Atrium: Left atrial size was normal in size. Right Atrium: Right atrial size was moderately dilated. Pericardium: Trivial pericardial effusion is present. Mitral Valve: The mitral valve is normal in structure. Mild mitral valve regurgitation. No evidence of mitral valve stenosis. Tricuspid Valve: The tricuspid valve is normal in structure. Tricuspid valve regurgitation is trivial. No evidence of tricuspid stenosis. Aortic Valve: The aortic valve is tricuspid. There is mild calcification of the aortic valve. Aortic valve regurgitation is not visualized. No aortic stenosis is present. Pulmonic Valve: The pulmonic valve was normal in structure. Pulmonic valve regurgitation is trivial. No evidence of pulmonic stenosis. Aorta: The aortic root is normal in size and structure. Venous: The inferior vena cava is normal in size with greater than 50% respiratory variability, suggesting right atrial pressure of 3 mmHg. IAS/Shunts: No atrial level shunt detected by color flow Doppler.  LEFT VENTRICLE PLAX 2D LVIDd:         5.05 cm LVIDs:         3.90 cm LV PW:         1.30 cm LV IVS:        1.40 cm  LVOT diam:     1.90 cm LV SV:         46 LV SV Index:   24 LVOT Area:      2.84 cm  RIGHT VENTRICLE TAPSE (M-mode): 2.1 cm LEFT ATRIUM             Index        RIGHT ATRIUM           Index LA diam:        4.60 cm 2.38 cm/m   RA Area:     22.70 cm LA Vol (A2C):   53.2 ml 27.54 ml/m  RA Volume:   73.80 ml  38.20 ml/m LA Vol (A4C):   64.5 ml 33.39 ml/m LA Biplane Vol: 63.3 ml 32.76 ml/m  AORTIC VALVE LVOT Vmax:   88.93 cm/s LVOT Vmean:  59.233 cm/s LVOT VTI:    0.162 m  AORTA Ao Root diam: 3.70 cm MITRAL VALVE                  TRICUSPID VALVE MV Area (PHT): 5.13 cm       TR Peak grad:   25.0 mmHg MV Decel Time: 148 msec       TR Vmax:        250.00 cm/s MR Peak grad:    72.6 mmHg MR Mean grad:    45.0 mmHg    SHUNTS MR Vmax:         426.00 cm/s  Systemic VTI:  0.16 m MR Vmean:        304.0 cm/s   Systemic Diam: 1.90 cm MR PISA:         1.01 cm MR PISA Eff ROA: 5 mm MR PISA Radius:  0.40 cm MV E velocity: 155.00 cm/s Vishnu Priya Mallipeddi Electronically signed by Lorelee Cover Mallipeddi Signature Date/Time: 03/05/2023/10:12:55 PM    Final    US RENAL  Result Date: 03/05/2023 CLINICAL DATA:  [id number] AKI (acute kidney injury) ([place]) [id number] EXAM: RENAL / URINARY TRACT ULTRASOUND COMPLETE COMPARISON:  CT 02/14/2022 FINDINGS: Right Kidney: Renal measurements: 8.1 x 4.0 x 3.3 cm = volume: 55.14 mL. Increased renal cortical echogenicity. No hydronephrosis. Left Kidney: Renal measurements: 8.1 x 4.9 x 4.2 cm = volume: 85.61 mL. Increased renal cortical echogenicity. No hydronephrosis. Bladder: Mildly distended. Other: None. IMPRESSION: Increased renal cortical echogenicity bilaterally, as can be seen in medical renal disease. No hydronephrosis. Electronically Signed   By: Maurine Simmering M.D.   On: 03/05/2023 11:56   DG Chest Port 1 View  Result Date: 03/04/2023 CLINICAL DATA:  Shortness of breath.  Irregular heartbeat. EXAM: PORTABLE CHEST 1 VIEW COMPARISON:  Radiographs 06/05/2013.  Abdominal CT 02/14/2022. FINDINGS: 1419 hours. The heart size is at the upper limits of normal for  portable AP technique. Probable mild atelectasis at both lung bases. No edema, confluent airspace opacity, pleural effusion or pneumothorax identified. No acute osseous findings are seen. There are mild degenerative changes in the spine. Telemetry leads overlie the chest. IMPRESSION: Borderline heart size with probable mild bibasilar atelectasis. No edema or confluent airspace opacity. Electronically Signed   By: Richardean Sale M.D.   On: 03/04/2023 14:26     Subjective: Pt eager to go home.  No specific complaints.  No palpitations, no CP, no SOB.    Discharge Exam: Vitals:   03/06/23 0026 03/06/23 0559  BP: 132/79 106/61  Pulse: 95 97  Resp: 20 18  Temp: 98.2 F (36.8 C) 98.7 F (37.1  C)  SpO2: 95% 97%   Vitals:   03/05/23 2033 03/06/23 0025 03/06/23 0026 03/06/23 0559  BP: 139/80 132/79 132/79 106/61  Pulse: 96 95 95 97  Resp: '20  20 18  '$ Temp: 98.1 F (36.7 C)  98.2 F (36.8 C) 98.7 F (37.1 C)  TempSrc: Oral  Oral Oral  SpO2: 99%  95% 97%  Weight:      Height:        General: Pt is alert, awake, not in acute distress Cardiovascular: normal S1/S2 +, no rubs, no gallops Respiratory: CTA bilaterally, no wheezing, no rhonchi Abdominal: Soft, NT, ND, bowel sounds + Extremities: no edema, no cyanosis   The results of significant diagnostics from this hospitalization (including imaging, microbiology, ancillary and laboratory) are listed below for reference.     Microbiology: No results found for this or any previous visit (from the past 240 hour(s)).   Labs: BNP (last 3 results) No results for input(s): "BNP" in the last 8760 hours. Basic Metabolic Panel: Recent Labs  Lab 03/04/23 1357 03/04/23 1421 03/04/23 1637 03/05/23 0408 03/06/23 0406  NA 144  --   --  142 143  K 3.6  --   --  3.3* 3.9  CL 114*  --   --  114* 113*  CO2 18*  --   --  18* 19*  GLUCOSE 96  --   --  85 103*  BUN 50*  --   --  49* 50*  CREATININE 7.15*  --   --  7.21* 7.25*  CALCIUM 8.3*   --   --  8.0* 8.3*  MG  --  1.8 1.9  --  1.9  PHOS  --   --  6.1*  --  6.7*   Liver Function Tests: Recent Labs  Lab 03/05/23 0408 03/06/23 0406  AST 11*  --   ALT 15  --   ALKPHOS 185*  --   BILITOT 0.5  --   PROT 5.8*  --   ALBUMIN 2.6* 2.5*   No results for input(s): "LIPASE", "AMYLASE" in the last 168 hours. No results for input(s): "AMMONIA" in the last 168 hours. CBC: Recent Labs  Lab 03/04/23 1357 03/05/23 0408 03/06/23 0406  WBC 7.9 7.3 7.3  HGB 9.3* 8.3* 8.1*  HCT 29.3* 27.0* 26.6*  MCV 92.4 94.1 93.0  PLT 284 266 273   Cardiac Enzymes: No results for input(s): "CKTOTAL", "CKMB", "CKMBINDEX", "TROPONINI" in the last 168 hours. BNP: Invalid input(s): "POCBNP" CBG: No results for input(s): "GLUCAP" in the last 168 hours. D-Dimer No results for input(s): "DDIMER" in the last 72 hours. Hgb A1c No results for input(s): "HGBA1C" in the last 72 hours. Lipid Profile No results for input(s): "CHOL", "HDL", "LDLCALC", "TRIG", "CHOLHDL", "LDLDIRECT" in the last 72 hours. Thyroid function studies Recent Labs    03/04/23 1638  TSH 2.381   Anemia work up Recent Labs    03/05/23 0935  TIBC 179*  IRON 39   Urinalysis    Component Value Date/Time   COLORURINE YELLOW 08/02/2009 [place] 08/02/2009 1702   LABSPEC 1.010 08/02/2009 1702   PHURINE 7.0 08/02/2009 1702   GLUCOSEU NEGATIVE 08/02/2009 1702   HGBUR SMALL (A) 08/02/2009 1702   BILIRUBINUR NEGATIVE 08/02/2009 1702   KETONESUR NEGATIVE 08/02/2009 1702   PROTEINUR 30 (A) 08/02/2009 1702   UROBILINOGEN 0.2 08/02/2009 1702   NITRITE NEGATIVE 08/02/2009 1702   LEUKOCYTESUR NEGATIVE 08/02/2009 1702   Sepsis Labs Recent Labs  Lab 03/04/23 1357 03/05/23 0408 03/06/23 0406  WBC 7.9 7.3 7.3   Microbiology No results found for this or any previous visit (from the past 240 hour(s)).  Time coordinating discharge: 38 mins   SIGNED:  Irwin Brakeman, MD  [facility] 03/06/2023, 10:43 AM How to contact the [facility] Attending or Consulting provider [place] or covering provider during after hours [place], for this patient?  Check the care team in [facility] and look for a) attending/consulting [facility] provider listed and b) the [facility] team listed Log into [email or web address] and use [place]'s universal password to access. If you do not have the password, please contact the hospital operator. Locate the [facility] provider you are looking for under [facility] and page to a number that you can be directly reached. If you still have difficulty reaching the provider, please page the [facility] (Director on Call) for the Hospitalists listed on amion for assistance.

## 2023-03-06 NOTE — Progress Notes (Signed)
[facility] Progress Note   Assessment/ Plan:   Assessment/Plan:  AKI/CKD stage IV - had been very stable with Scr 3.1-4 for the past year but jumped to 7.21.  No uremic symptoms, however she was close to initiating dialysis and is scheduled for AVG placement by Dr. Donnetta Hutching on 03/12/23.  - still no uremic symptoms - Cr was 8.77 in Feb and is now better at 7.22 and stable - has not been on diuretics - Cr stable today, no indication for HD today - unclear if she will need diuretics going forward- she is not on any now and says swelling is improved so perhaps controlling Afib has helped - will need close followup at [facility]- pt is very eager to go today and is walking up in the hall with PT  New onset atrial fibrillation with RVR - s/p heparin drip and metoprolol with improved rate control.  Now on Eliquis  Cardiology following.  TTE EF 40-45%.  Further recs with cardiology. HTN - stable Anemia of CKD stage IV-V - will check iron stores and will start ESA- on Aranesp, iron panel reasonable, gets retacrit as OP HLD - on atorvastatin CKD-BMD - hyperphosphatemia noted.  Will start binders and follow.  Hypokalemia - replete gently given AKI/CKD stage IV-V Vascular access - AVG 3/19 with Dr Donnetta Hutching Dispo: Ok to d/c with close followup- will need to see [place] Monday- have d/w pt and sent message to scheduler.     Subjective:    Seen in room.  Up, walking around.  Very eager to go home today.  Says swelling is improved, can't get a uremic symptom out of her.     Objective:   BP 106/61 (BP Location: Left Arm)   Pulse 97   Temp 98.7 F (37.1 C) (Oral)   Resp 18   Ht '5\' 2"'$  (1.575 m)   Wt 93 kg   SpO2 97%   BMI 37.50 kg/m   Intake/Output Summary (Last 24 hours) at 03/06/2023 [id number] Last data filed at 03/05/2023 1833 Gross per 24 hour  Intake 480 ml  Output --  Net 480 ml   Weight change:   Physical Exam: Gen:NAD, sitting up in bed CVS: irregular Resp: normal WOB, no  O2, occ expiratory wheeze Abd: soft Ext: 2+ LE edema to mid- shin  Imaging: ECHOCARDIOGRAM COMPLETE  Result Date: 03/05/2023    ECHOCARDIOGRAM REPORT   Patient Name:   [facility] Date of Exam: 03/05/2023 Medical Rec #:  [id number]       Height:       62.0 in Accession #:    [id number]      Weight:       205.0 lb Date of Birth:  Jul 11, 1956      BSA:          1.932 m Patient Age:    67 years        BP:           117/66 mmHg Patient Gender: F               HR:           70 bpm. Exam Location:  [facility] Procedure: 2D Echo, Cardiac Doppler and Color Doppler Indications:    Atrial Fibrillation I48.91  History:        Patient has prior history of Echocardiogram examinations, most                 recent 05/02/2018. Risk  Factors:Hypertension and Dyslipidemia.  Sonographer:    Alvino Chapel RCS Referring Phys: [id number] [employer]  1. Left ventricular ejection fraction, by estimation, is 40 to 45%. The left ventricle has mildly decreased function. The left ventricle demonstrates global hypokinesis. There is moderate left ventricular hypertrophy. Left ventricular diastolic function  could not be evaluated.  2. Right ventricular systolic function is normal. The right ventricular size is normal. There is normal pulmonary artery systolic pressure. The estimated right ventricular systolic pressure is Q000111Q mmHg.  3. Right atrial size was moderately dilated.  4. The mitral valve is normal in structure. Mild mitral valve regurgitation. No evidence of mitral stenosis.  5. The aortic valve is tricuspid. There is mild calcification of the aortic valve. Aortic valve regurgitation is not visualized. No aortic stenosis is present.  6. The inferior vena cava is normal in size with greater than 50% respiratory variability, suggesting right atrial pressure of 3 mmHg. Comparison(s): No prior Echocardiogram. FINDINGS  Left Ventricle: Left ventricular ejection fraction, by estimation, is 40 to 45%. The left ventricle has  mildly decreased function. The left ventricle demonstrates global hypokinesis. The left ventricular internal cavity size was normal in size. There is  moderate left ventricular hypertrophy. Left ventricular diastolic function could not be evaluated due to atrial fibrillation. Left ventricular diastolic function could not be evaluated. Right Ventricle: The right ventricular size is normal. No increase in right ventricular wall thickness. Right ventricular systolic function is normal. There is normal pulmonary artery systolic pressure. The tricuspid regurgitant velocity is 2.50 m/s, and  with an assumed right atrial pressure of 3 mmHg, the estimated right ventricular systolic pressure is Q000111Q mmHg. Left Atrium: Left atrial size was normal in size. Right Atrium: Right atrial size was moderately dilated. Pericardium: Trivial pericardial effusion is present. Mitral Valve: The mitral valve is normal in structure. Mild mitral valve regurgitation. No evidence of mitral valve stenosis. Tricuspid Valve: The tricuspid valve is normal in structure. Tricuspid valve regurgitation is trivial. No evidence of tricuspid stenosis. Aortic Valve: The aortic valve is tricuspid. There is mild calcification of the aortic valve. Aortic valve regurgitation is not visualized. No aortic stenosis is present. Pulmonic Valve: The pulmonic valve was normal in structure. Pulmonic valve regurgitation is trivial. No evidence of pulmonic stenosis. Aorta: The aortic root is normal in size and structure. Venous: The inferior vena cava is normal in size with greater than 50% respiratory variability, suggesting right atrial pressure of 3 mmHg. IAS/Shunts: No atrial level shunt detected by color flow Doppler.  LEFT VENTRICLE PLAX 2D LVIDd:         5.05 cm LVIDs:         3.90 cm LV PW:         1.30 cm LV IVS:        1.40 cm LVOT diam:     1.90 cm LV SV:         46 LV SV Index:   24 LVOT Area:     2.84 cm  RIGHT VENTRICLE TAPSE (M-mode): 2.1 cm LEFT ATRIUM              Index        RIGHT ATRIUM           Index LA diam:        4.60 cm 2.38 cm/m   RA Area:     22.70 cm LA Vol (A2C):   53.2 ml 27.54 ml/m  RA Volume:   73.80 ml  38.20 ml/m LA Vol (A4C):   64.5 ml 33.39 ml/m LA Biplane Vol: 63.3 ml 32.76 ml/m  AORTIC VALVE LVOT Vmax:   88.93 cm/s LVOT Vmean:  59.233 cm/s LVOT VTI:    0.162 m  AORTA Ao Root diam: 3.70 cm MITRAL VALVE                  TRICUSPID VALVE MV Area (PHT): 5.13 cm       TR Peak grad:   25.0 mmHg MV Decel Time: 148 msec       TR Vmax:        250.00 cm/s MR Peak grad:    72.6 mmHg MR Mean grad:    45.0 mmHg    SHUNTS MR Vmax:         426.00 cm/s  Systemic VTI:  0.16 m MR Vmean:        304.0 cm/s   Systemic Diam: 1.90 cm MR PISA:         1.01 cm MR PISA Eff ROA: 5 mm MR PISA Radius:  0.40 cm MV E velocity: 155.00 cm/s Vishnu Priya Mallipeddi Electronically signed by Lorelee Cover Mallipeddi Signature Date/Time: 03/05/2023/10:12:55 PM    Final    US RENAL  Result Date: 03/05/2023 CLINICAL DATA:  [id number] AKI (acute kidney injury) ([place]) [id number] EXAM: RENAL / URINARY TRACT ULTRASOUND COMPLETE COMPARISON:  CT 02/14/2022 FINDINGS: Right Kidney: Renal measurements: 8.1 x 4.0 x 3.3 cm = volume: 55.14 mL. Increased renal cortical echogenicity. No hydronephrosis. Left Kidney: Renal measurements: 8.1 x 4.9 x 4.2 cm = volume: 85.61 mL. Increased renal cortical echogenicity. No hydronephrosis. Bladder: Mildly distended. Other: None. IMPRESSION: Increased renal cortical echogenicity bilaterally, as can be seen in medical renal disease. No hydronephrosis. Electronically Signed   By: Maurine Simmering M.D.   On: 03/05/2023 11:56   DG Chest Port 1 View  Result Date: 03/04/2023 CLINICAL DATA:  Shortness of breath.  Irregular heartbeat. EXAM: PORTABLE CHEST 1 VIEW COMPARISON:  Radiographs 06/05/2013.  Abdominal CT 02/14/2022. FINDINGS: 1419 hours. The heart size is at the upper limits of normal for portable AP technique. Probable mild atelectasis at both lung bases.  No edema, confluent airspace opacity, pleural effusion or pneumothorax identified. No acute osseous findings are seen. There are mild degenerative changes in the spine. Telemetry leads overlie the chest. IMPRESSION: Borderline heart size with probable mild bibasilar atelectasis. No edema or confluent airspace opacity. Electronically Signed   By: Richardean Sale M.D.   On: 03/04/2023 14:26    Labs: BMET Recent Labs  Lab 03/04/23 1357 03/04/23 1637 03/05/23 0408 03/06/23 0406  NA 144  --  142 143  K 3.6  --  3.3* 3.9  CL 114*  --  114* 113*  CO2 18*  --  18* 19*  GLUCOSE 96  --  85 103*  BUN 50*  --  49* 50*  CREATININE 7.15*  --  7.21* 7.25*  CALCIUM 8.3*  --  8.0* 8.3*  PHOS  --  6.1*  --  6.7*   CBC Recent Labs  Lab 02/27/23 0838 03/04/23 1357 03/05/23 0408 03/06/23 0406  WBC  --  7.9 7.3 7.3  HGB 8.3* 9.3* 8.3* 8.1*  HCT  --  29.3* 27.0* 26.6*  MCV  --  92.4 94.1 93.0  PLT  --  284 266 273    Medications:     amLODipine  10 mg Oral Daily   apixaban  5 mg Oral BID   atorvastatin  10 mg Oral Daily   calcitRIOL  0.25 mcg Oral Daily   darbepoetin (ARANESP) injection - DIALYSIS  100 mcg Subcutaneous Q Tue-1800   metoprolol tartrate  50 mg Oral Q6H   sevelamer carbonate  800 mg Oral TID WC   sodium bicarbonate  650 mg Oral BID   Madelon Lips MD 03/06/2023, 9:22 AM

## 2023-03-06 NOTE — Progress Notes (Signed)
Patient slept through this shift, only waking to to receive medications, and have vitals taken. Continued to monitor.

## 2023-03-07 ENCOUNTER — Telehealth: Payer: Self-pay

## 2023-03-07 NOTE — Pre-Procedure Instructions (Signed)
Attempted pre-op phonecall. Left VM for her to call us back. 

## 2023-03-07 NOTE — Transitions of Care (Post Inpatient/ED Visit) (Signed)
   03/07/2023  Name: GERI HEPLER MRN: [id number] DOB: 06/01/1956  Today's TOC FU Call Status: Today's TOC FU Call Status:: Unsuccessul Call (1st Attempt) Unsuccessful Call (1st Attempt) Date: 03/07/23  Attempted to reach the patient regarding the most recent Inpatient/ED visit.  Follow Up Plan: Additional outreach attempts will be made to reach the patient to complete the Transitions of Care (Post Inpatient/ED visit) call.     Enzo Montgomery, RN,BSN,CCM [facility] Care Management Care Management Community Coordinator Direct Phone: [phone] Toll Free: [phone] Fax: [phone]

## 2023-03-08 ENCOUNTER — Telehealth: Payer: Self-pay

## 2023-03-08 ENCOUNTER — Encounter (HOSPITAL_COMMUNITY)
Admission: RE | Admit: 2023-03-08 | Discharge: 2023-03-08 | Disposition: A | Discharge status: Home / Self Care | Payer: No Typology Code available for payment source | Source: Ambulatory Visit | Attending: Vascular Surgery | Admitting: Vascular Surgery

## 2023-03-08 DIAGNOSIS — N184 Chronic kidney disease, stage 4 (severe): Secondary | ICD-10-CM

## 2023-03-08 NOTE — Pre-Procedure Instructions (Signed)
Patient called back for pre-op phone call. I have not heard back from Dr Luther Parody office to verify this was still going to proceed due to recent hospitalization for new onset AFib and being put on eliquis. I told patient I would call back when I hear from Dr Luther Parody office. She verbalized understanding.

## 2023-03-08 NOTE — Transitions of Care (Post Inpatient/ED Visit) (Signed)
   03/08/2023  Name: [facility] MRN: [id number] DOB: 1955/12/28  Today's TOC FU Call Status: Today's TOC FU Call Status:: Successful TOC FU Call Competed TOC FU Call Complete Date: 03/08/23  Transition Care Management Follow-up Telephone Call Date of Discharge: 03/06/23 Discharge [facility] Type of Discharge: Inpatient Admission Primary Inpatient Discharge Diagnosis:: "A-fib w/ RVR" How have you been since you were released from the hospital?: Better Any questions or concerns?: No  Items Reviewed: Did you receive and understand the discharge instructions provided?: No Medications obtained and verified?: Yes (Medications Reviewed) Any new allergies since your discharge?: No Dietary orders reviewed?: Yes Type of Diet Ordered:: renal w/ fluid restriction Do you have support at home?: Yes People in Home: spouse Name of Support/Comfort Primary Source: [facility] and Equipment/Supplies: [place] Ordered?: NA Any new equipment or medical supplies ordered?: NA  Functional Questionnaire: Do you need assistance with bathing/showering or dressing?: No Do you need assistance with meal preparation?: No Do you need assistance with eating?: No Do you have difficulty maintaining continence: No Do you need assistance with getting out of bed/getting out of a chair/moving?: No Do you have difficulty managing or taking your medications?: No  Folllow up appointments reviewed: PCP Follow-up appointment confirmed?: Yes Date of PCP follow-up appointment?: 03/20/23 Follow-up Provider: Dr. Carlota Raspberry Specialist [facility] Follow-up appointment confirmed?: Yes Date of Specialist follow-up appointment?: 03/21/23 Follow-Up Specialty Provider:: Seward Speck, appt w/  [facility]-03/11/23 Do you need transportation to your follow-up appointment?: No (states spouse or daughter takes her to appts) Do you understand care options if your condition(s) worsen?: Yes-patient  verbalized understanding  SDOH Interventions Today    Flowsheet Row Most Recent Value  SDOH Interventions   Food Insecurity Interventions Intervention Not Indicated  Transportation Interventions Intervention Not Indicated      TOC Interventions Today    Flowsheet Row Most Recent Value  TOC Interventions   TOC Interventions Discussed/Reviewed TOC Interventions Discussed, Arranged PCP follow up less than 12 days/Care Guide scheduled      Interventions Today    Flowsheet Row Most Recent Value  Education Interventions   Education Provided Provided Education  Provided Verbal Education On Nutrition, When to see the doctor, Medication  Nutrition Interventions   Nutrition Discussed/Reviewed Nutrition Discussed  Pharmacy Interventions   Pharmacy Dicussed/Reviewed Pharmacy Topics Discussed, Medications and their functions  Safety Interventions   Safety Discussed/Reviewed Safety Discussed      Hetty Blend [facility] Care Management Care Management Community Coordinator Direct Phone: [phone] Toll Free: [phone] Fax: [phone]

## 2023-03-08 NOTE — Telephone Encounter (Signed)
Received a notification from Obie Dredge., RN from preadmission testing, informing that patient was just discharged from the hospital for new onset AFib and started on Eliquis. Patient is to follow up with cardiology on 3/28. Spoke with Dr. Donnetta Hutching and he advised to cancel left arm Crawford surgery on 3/19 due to patient will need to be maintained on Eliquis for a period of time and heart rate stabilized.   Spoke with patient and informed of the above recommendations. Advised will follow up after her cardiology appointment to inquire when she can safely hold Eliquis for surgery. Pt verbalized understanding.

## 2023-03-11 DIAGNOSIS — N184 Chronic kidney disease, stage 4 (severe): Secondary | ICD-10-CM | POA: Diagnosis not present

## 2023-03-11 DIAGNOSIS — R809 Proteinuria, unspecified: Secondary | ICD-10-CM | POA: Diagnosis not present

## 2023-03-11 DIAGNOSIS — N189 Chronic kidney disease, unspecified: Secondary | ICD-10-CM | POA: Diagnosis not present

## 2023-03-12 ENCOUNTER — Encounter (HOSPITAL_COMMUNITY): Admission: RE | Payer: Self-pay | Source: Home / Self Care

## 2023-03-12 ENCOUNTER — Ambulatory Visit (HOSPITAL_COMMUNITY)
Admission: RE | Admit: 2023-03-12 | Payer: No Typology Code available for payment source | Source: Home / Self Care | Admitting: Vascular Surgery

## 2023-03-12 SURGERY — INSERTION OF ARTERIOVENOUS (AV) GORE-TEX GRAFT ARM
Anesthesia: Choice | Laterality: Left

## 2023-03-20 ENCOUNTER — Inpatient Hospital Stay: Payer: No Typology Code available for payment source | Admitting: Family Medicine

## 2023-03-21 ENCOUNTER — Ambulatory Visit: Payer: No Typology Code available for payment source | Admitting: Nurse Practitioner

## 2023-03-26 ENCOUNTER — Encounter (INDEPENDENT_AMBULATORY_CARE_PROVIDER_SITE_OTHER): Payer: No Typology Code available for payment source | Admitting: Nurse Practitioner

## 2023-03-26 ENCOUNTER — Telehealth: Payer: Self-pay | Admitting: Nurse Practitioner

## 2023-03-26 ENCOUNTER — Encounter: Payer: Self-pay | Admitting: Nurse Practitioner

## 2023-03-26 ENCOUNTER — Other Ambulatory Visit (HOSPITAL_COMMUNITY): Payer: Self-pay | Admitting: *Deleted

## 2023-03-26 VITALS — BP 140/72 | HR 57 | Ht 63.0 in | Wt 215.0 lb

## 2023-03-26 DIAGNOSIS — I10 Essential (primary) hypertension: Secondary | ICD-10-CM

## 2023-03-26 DIAGNOSIS — Z01810 Encounter for preprocedural cardiovascular examination: Secondary | ICD-10-CM

## 2023-03-26 DIAGNOSIS — R600 Localized edema: Secondary | ICD-10-CM | POA: Insufficient documentation

## 2023-03-26 DIAGNOSIS — Z9229 Personal history of other drug therapy: Secondary | ICD-10-CM | POA: Insufficient documentation

## 2023-03-26 DIAGNOSIS — Z7901 Long term (current) use of anticoagulants: Secondary | ICD-10-CM

## 2023-03-26 DIAGNOSIS — I428 Other cardiomyopathies: Secondary | ICD-10-CM

## 2023-03-26 DIAGNOSIS — Z5181 Encounter for therapeutic drug level monitoring: Secondary | ICD-10-CM | POA: Insufficient documentation

## 2023-03-26 DIAGNOSIS — I480 Paroxysmal atrial fibrillation: Secondary | ICD-10-CM

## 2023-03-26 DIAGNOSIS — I4891 Unspecified atrial fibrillation: Secondary | ICD-10-CM

## 2023-03-26 DIAGNOSIS — D631 Anemia in chronic kidney disease: Secondary | ICD-10-CM | POA: Insufficient documentation

## 2023-03-26 DIAGNOSIS — N184 Chronic kidney disease, stage 4 (severe): Secondary | ICD-10-CM | POA: Diagnosis not present

## 2023-03-26 DIAGNOSIS — I5022 Chronic systolic (congestive) heart failure: Secondary | ICD-10-CM

## 2023-03-26 NOTE — Patient Instructions (Addendum)
Medication Instructions:  Your physician has recommended you make the following change in your medication:  STOP amlodipine Continue all other medications as directed  Labwork: none  Testing/Procedures: Your physician has recommended that you wear an event monitor. Event monitors are medical devices that record the heart's electrical activity. Doctors most often [place] these monitors to diagnose arrhythmias. Arrhythmias are problems with the speed or rhythm of the heartbeat. The monitor is a small, portable device. You can wear one while you do your normal daily activities. This is usually used to diagnose what is causing palpitations/syncope (passing out).   Follow-Up:  Your physician recommends that you schedule a follow-up appointment in: 6-8 weeks  Any Other Special Instructions Will Be Listed Below (If Applicable).  You have been referred to Anticoagulation Clinic    If you need a refill on your cardiac medications before your next appointment, please call your pharmacy.

## 2023-03-26 NOTE — Progress Notes (Addendum)
Office Visit    Patient Name: Patricia Wu Date of Encounter: 03/26/2023  PCP:  Wendie Agreste, MD   [place]  Cardiologist:  Chalmers Guest, MD  Advanced Practice Provider:  Finis Bud, NP Electrophysiologist:  None   Chief Complaint    Patricia Wu is a 67 y.o. female with a hx of HFmrEF, HTN, HLD, asthma, obesity, and CKD IV (sees nephrology), and A-fib, who presents today for hospital follow-up.   Past Medical History    Past Medical History:  Diagnosis Date   CKD (chronic kidney disease)    Hypercholesteremia    Hypertension    Noncompliance with medication regimen    Past Surgical History:  Procedure Laterality Date   COLONOSCOPY WITH PROPOFOL N/A 10/17/2020   Procedure: COLONOSCOPY WITH PROPOFOL;  Surgeon: Eloise Harman, DO;  [facility];  Service: Endoscopy;  Laterality: N/A;  1:00pm   POLYPECTOMY  10/17/2020   Procedure: POLYPECTOMY;  Surgeon: Eloise Harman, DO;  [facility];  Service: Endoscopy;;    Allergies  Allergies  Allergen Reactions   Ibuprofen Other (See Comments)    pcp said raises her BP    History of Present Illness    Patricia Wu is a 67 y.o. female with a PMH as mentioned above.  Previous patient of Dr. Bronson Ing.  Last seen by him in 2019.   Admitted in March 2024 with A-fib with RVR.  Was found to be be in A-fib with RVR at nephrologist office.  Cardiology was consulted for treatment.  Became stable after 2 doses of IV diltiazem.  Eliquis was initiated.  Heart rate stabilized on metoprolol 50 mg twice daily.  Hospital course complicated by AKI on CKD stage IV.  Baseline creatinine around 3.5.  Per patient's report, she is being evaluated for AV graft.  Nephrology was consulted, was told to follow-up outpatient.  Today she presents for outpatient follow-up. She states she has noticed leg swelling after medication changes. Requesting to switch to different  anticoagulant, d/t price of Eliquis. Denies any palpitations. Denies any chest pain, shortness of breath, syncope, presyncope, dizziness, orthopnea, PND, acute bleeding, or claudication. Weight is up 18 lbs from 12/2022. Compliant with medications.   EKGs/Labs/Other Studies Reviewed:   The following studies were reviewed today:   EKG:  EKG is ordered today.  The ekg ordered today demonstrates SB, 56 bpm, nonspecific ST/T wave changes, nothing acute.   Echo 02/2023:   1. Left ventricular ejection fraction, by estimation, is 40 to 45%. The  left ventricle has mildly decreased function. The left ventricle  demonstrates global hypokinesis. There is moderate left ventricular  hypertrophy. Left ventricular diastolic function   could not be evaluated.   2. Right ventricular systolic function is normal. The right ventricular  size is normal. There is normal pulmonary artery systolic pressure. The  estimated right ventricular systolic pressure is Q000111Q mmHg.   3. Right atrial size was moderately dilated.   4. The mitral valve is normal in structure. Mild mitral valve  regurgitation. No evidence of mitral stenosis.   5. The aortic valve is tricuspid. There is mild calcification of the  aortic valve. Aortic valve regurgitation is not visualized. No aortic  stenosis is present.   6. The inferior vena cava is normal in size with greater than 50%  respiratory variability, suggesting right atrial pressure of 3 mmHg.   Comparison(s): No prior Echocardiogram.  Recent Labs: 03/04/2023: TSH  2.381 03/05/2023: ALT 15 03/06/2023: Magnesium 1.9; Platelets 273 03/27/2023: BUN 60; Creatinine, Ser 7.72; Hemoglobin 10.6; Potassium 4.5; Sodium 141  Recent Lipid Panel    Component Value Date/Time   CHOL 187 01/16/2023 1201   CHOL 190 02/08/2021 0950   TRIG 83.0 01/16/2023 1201   HDL 49.50 01/16/2023 1201   HDL 41 02/08/2021 0950   CHOLHDL 4 01/16/2023 1201   VLDL 16.6 01/16/2023 1201   LDLCALC 121 (H)  01/16/2023 1201   LDLCALC 128 (H) 02/08/2021 0950    Risk Assessment/Calculations:   CHA2DS2-VASc Score = 4  This indicates a 4.8% annual risk of stroke. The patient's score is based upon: CHF History: 1 HTN History: 1 Diabetes History: 0 Stroke History: 0 Vascular Disease History: 0 Age Score: 1 Gender Score: 1   The 10-year ASCVD risk score (Arnett DK, et al., 2019) is: 15.3%   Values used to calculate the score:     Age: 37 years     Sex: Female     Is Non-Hispanic African American: Yes     Diabetic: No     Tobacco smoker: No     Systolic Blood Pressure: Q000111Q mmHg     Is BP treated: Yes     HDL Cholesterol: 49.5 mg/dL     Total Cholesterol: 187 mg/dL  Home Medications   Current Meds  Medication Sig   acetaminophen (TYLENOL) 325 MG tablet Take 325-650 mg by mouth every 6 (six) hours as needed (pain.).    albuterol (VENTOLIN HFA) 108 (90 Base) MCG/ACT inhaler Inhale 1-2 puffs into the lungs every 6 (six) hours as needed for wheezing or shortness of breath.   apixaban (ELIQUIS) 5 MG TABS tablet Take 1 tablet (5 mg total) by mouth 2 (two) times daily.   atorvastatin (LIPITOR) 10 MG tablet TAKE 1 TABLET(10 MG) BY MOUTH DAILY   calcitRIOL (ROCALTROL) 0.25 MCG capsule Take 0.25 mcg by mouth daily.   metoprolol tartrate (LOPRESSOR) 50 MG tablet Take 1 tablet (50 mg total) by mouth 2 (two) times daily.   sodium bicarbonate 650 MG tablet Take 650 mg by mouth 2 (two) times daily.   amLODipine (NORVASC) 10 MG tablet Take 1 tablet (10 mg total) by mouth daily.     Review of Systems    All other systems reviewed and are otherwise negative except as noted above.  Physical Exam    VS:  BP (!) 140/72 (BP Location: Right Arm, Patient Position: Sitting, Cuff Size: Large)   Pulse (!) 57   Ht 5\' 3"  (1.6 m)   Wt 215 lb (97.5 kg)   SpO2 99%   BMI 38.09 kg/m  , BMI Body mass index is 38.09 kg/m.  Wt Readings from Last 3 Encounters:  03/26/23 215 lb (97.5 kg)  03/04/23 205 lb 0.4  oz (93 kg)  01/16/23 197 lb 12.8 oz (89.7 kg)     GEN: Obese, 67 y.o. female in no acute distress. HEENT: normal. Neck: Supple, no JVD, carotid bruits, or masses. Cardiac: S1/S2, RRR, no murmurs, rubs, or gallops. No clubbing, cyanosis. 3+ nonpitting/dependent edema along BLE, Radials/PT 2+ and equal bilaterally.  Respiratory:  Respirations regular and unlabored, clear to auscultation bilaterally. MS: No deformity or atrophy. Skin: Warm and dry, no rash. Neuro:  Strength and sensation are intact. Psych: Normal affect.  Assessment & Plan    HFmrEF, NICM, leg edema Recent Echo revealed EF mildly reduced at 40-45%, most likely d/t tachy-mediated from A-fib with RVR. Wt is up from  12/2022. Admits to leg edema since starting amlodipine. Will stop amlodipine. Will request labs done recently with Nephrology. Lasix was recently held d/t AKI on CKD - will check labs to see if diuretic can be added back to regimen. Recommended low salt, heart healthy diet encouraged. Recommended compression stockings for leg edema/leg elevation. If no improvement by next visit, consider referral to lymphedema clinic.  GDMT limited due to current kidney disease.  No other medication changes at this time. ED precautions discussed.  PAF, long term anticoagulation Denies any tachycardia or palpitations.  She is in sinus rhythm on exam today.  Heart rate 56 bpm, asymptomatic.  Will arrange [employer] cardiac monitor to determine A-fib burden.  She is requesting to switch to different anticoagulation due to cost issue.  Will get patient established with Coumadin clinic due to CHA2DS2-VASc score of 4.  CKD stage 4 Recent hospital course complicated by AKI on CKD.  Lasix was on hold.  Recently had labs drawn with nephrologist.  Will request these labs to be sent to [place].  Avoid nephrotoxic agents.  Encourage adequate hydration.  No medication changes at this time.  Continue follow-up with nephrology and PCP.  HTN BP mildly  elevated.  Stopping amlodipine as mentioned above.  Will await recent lab work results before medication is adjusted.  Medication therapy/options limited for hypertension management due to CKD stage IV-see above.  Continue Lopressor.  Hesitant to uptitrate with current heart rate at 56 bpm. Discussed to monitor BP at home at least 2 hours after medications and sitting for 5-10 minutes. BP log and salty six diet sheet given today.  If no improvement by follow-up, plan to refer to advanced hypertension clinic.  5. Pre-op evaluation Ms. Icenhour's perioperative risk of a major cardiac event is 6.6% according to the Revised Cardiac Risk Index (RCRI).  Therefore, she is at high risk for perioperative complications.   Her functional capacity is good at 5.07 METs according to the Duke Activity Status Index (DASI).  Recommendations: According to ACC/AHA guidelines, no further cardiovascular testing needed.  The patient may proceed to surgery at acceptable risk.   Antiplatelet and/or Anticoagulation Recommendations: Per office protocol, she can hold warfarin for 3 days prior to procedure. She will not need bridging with Lovenox around procedure. Discussed risk with Dr. Jenene Slicker and stated she is okay to proceed. Patient is not having any chest pain, shortness of breath, or signs or symptoms of ADHF. I discussed high RCRI and she verbalized understanding and is agreeable to proceed. Will route note to requesting party.   Disposition: Follow up in 6-8 week(s) with Vishnu P Mallipeddi, MD or APP.  Signed, Sharlene Dory, NP 03/28/2023, 1:01 PM [facility]

## 2023-03-26 NOTE — Telephone Encounter (Signed)
30 day [employer] dx: Afib

## 2023-03-27 ENCOUNTER — Encounter (HOSPITAL_COMMUNITY)
Admission: RE | Admit: 2023-03-27 | Discharge: 2023-03-27 | Disposition: A | Discharge status: Home / Self Care | Payer: No Typology Code available for payment source | Source: Ambulatory Visit | Attending: Nephrology | Admitting: Nephrology

## 2023-03-27 VITALS — BP 161/73 | HR 66 | Temp 98.2°F | Resp 17

## 2023-03-27 DIAGNOSIS — D631 Anemia in chronic kidney disease: Secondary | ICD-10-CM | POA: Diagnosis not present

## 2023-03-27 DIAGNOSIS — Z9229 Personal history of other drug therapy: Secondary | ICD-10-CM | POA: Diagnosis not present

## 2023-03-27 DIAGNOSIS — I428 Other cardiomyopathies: Secondary | ICD-10-CM | POA: Diagnosis not present

## 2023-03-27 DIAGNOSIS — I10 Essential (primary) hypertension: Secondary | ICD-10-CM | POA: Diagnosis not present

## 2023-03-27 DIAGNOSIS — I4891 Unspecified atrial fibrillation: Secondary | ICD-10-CM | POA: Diagnosis not present

## 2023-03-27 DIAGNOSIS — N184 Chronic kidney disease, stage 4 (severe): Secondary | ICD-10-CM | POA: Diagnosis not present

## 2023-03-27 DIAGNOSIS — Z01810 Encounter for preprocedural cardiovascular examination: Secondary | ICD-10-CM | POA: Diagnosis not present

## 2023-03-27 DIAGNOSIS — R600 Localized edema: Secondary | ICD-10-CM | POA: Diagnosis not present

## 2023-03-27 DIAGNOSIS — I480 Paroxysmal atrial fibrillation: Secondary | ICD-10-CM | POA: Diagnosis not present

## 2023-03-27 DIAGNOSIS — I5022 Chronic systolic (congestive) heart failure: Secondary | ICD-10-CM | POA: Diagnosis not present

## 2023-03-27 DIAGNOSIS — Z5181 Encounter for therapeutic drug level monitoring: Secondary | ICD-10-CM | POA: Diagnosis not present

## 2023-03-27 DIAGNOSIS — Z7901 Long term (current) use of anticoagulants: Secondary | ICD-10-CM | POA: Diagnosis not present

## 2023-03-27 LAB — RENAL FUNCTION PANEL
Albumin: 3.1 g/dL — ABNORMAL LOW (ref 3.5–5.0)
Anion gap: 10 (ref 5–15)
BUN: 60 mg/dL — ABNORMAL HIGH (ref 8–23)
CO2: 18 mmol/L — ABNORMAL LOW (ref 22–32)
Calcium: 8.8 mg/dL — ABNORMAL LOW (ref 8.9–10.3)
Chloride: 113 mmol/L — ABNORMAL HIGH (ref 98–111)
Creatinine, Ser: 7.72 mg/dL — ABNORMAL HIGH (ref 0.44–1.00)
GFR, Estimated: 5 mL/min — ABNORMAL LOW (ref >60)
Glucose, Bld: 99 mg/dL (ref 70–99)
Phosphorus: 6.9 mg/dL — ABNORMAL HIGH (ref 2.5–4.6)
Potassium: 4.5 mmol/L (ref 3.5–5.1)
Sodium: 141 mmol/L (ref 135–145)

## 2023-03-27 LAB — IRON AND TIBC
Iron: 48 ug/dL (ref 28–170)
Saturation Ratios: 20 % (ref 10.4–31.8)
TIBC: 244 ug/dL — ABNORMAL LOW (ref 250–450)
UIBC: 196 ug/dL

## 2023-03-27 LAB — POCT HEMOGLOBIN-HEMACUE: Hemoglobin: 10.6 g/dL — ABNORMAL LOW (ref 12.0–15.0)

## 2023-03-27 LAB — FERRITIN: Ferritin: 212 ng/mL (ref 11–307)

## 2023-03-27 MED ORDER — EPOETIN ALFA-EPBX 10000 UNIT/ML IJ SOLN
20000.0000 [IU] | Freq: Once | INTRAMUSCULAR | Status: AC
  Administered 2023-03-27: 20000 [IU] via SUBCUTANEOUS
  Filled 2023-03-27: qty 2

## 2023-03-27 MED ORDER — CLONIDINE HCL 0.1 MG PO TABS: 0.1000 mg | ORAL_TABLET | Freq: Once | ORAL | Status: DC

## 2023-03-27 NOTE — Progress Notes (Signed)
Diagnosis: Anemia in Chronic Kidney Disease  Provider:  Donato Heinz MD  Procedure: Injection  Retacrit (epoetin alfa-epbx), Dose: 20000 Units, Site: subcutaneous, Number of injections: 2  Hgb 10.6. Administered in abdomen by Azzie Almas, RN.  Post Care: Patient declined observation  Discharge: Condition: Good, Destination: Home . AVS Provided  Performed by:  Binnie Kand, RN

## 2023-04-02 ENCOUNTER — Telehealth: Payer: Self-pay | Admitting: Nurse Practitioner

## 2023-04-02 DIAGNOSIS — I480 Paroxysmal atrial fibrillation: Secondary | ICD-10-CM | POA: Diagnosis not present

## 2023-04-02 DIAGNOSIS — I4891 Unspecified atrial fibrillation: Secondary | ICD-10-CM | POA: Diagnosis not present

## 2023-04-02 NOTE — Telephone Encounter (Signed)
    1.  What medication and dosage are you requesting samples for?  apixaban (ELIQUIS) 5 MG TABS tablet [[id number]]   2.  Are you currently out of this medication?  2 PILLS LEFT    Patient has appointment on Thursday 04/04/2023 to see Patricia Wu. RN

## 2023-04-03 ENCOUNTER — Encounter: Payer: Self-pay | Admitting: Family Medicine

## 2023-04-03 ENCOUNTER — Telehealth: Payer: Self-pay

## 2023-04-03 ENCOUNTER — Ambulatory Visit (INDEPENDENT_AMBULATORY_CARE_PROVIDER_SITE_OTHER): Payer: No Typology Code available for payment source | Admitting: Family Medicine

## 2023-04-03 ENCOUNTER — Ambulatory Visit: Payer: No Typology Code available for payment source | Attending: Nurse Practitioner

## 2023-04-03 VITALS — BP 162/88 | HR 65 | Temp 97.8°F | Ht 63.0 in | Wt 204.2 lb

## 2023-04-03 DIAGNOSIS — I480 Paroxysmal atrial fibrillation: Secondary | ICD-10-CM

## 2023-04-03 DIAGNOSIS — N179 Acute kidney failure, unspecified: Secondary | ICD-10-CM | POA: Diagnosis not present

## 2023-04-03 DIAGNOSIS — I4891 Unspecified atrial fibrillation: Secondary | ICD-10-CM

## 2023-04-03 DIAGNOSIS — R490 Dysphonia: Secondary | ICD-10-CM | POA: Diagnosis not present

## 2023-04-03 DIAGNOSIS — J4520 Mild intermittent asthma, uncomplicated: Secondary | ICD-10-CM

## 2023-04-03 DIAGNOSIS — N184 Chronic kidney disease, stage 4 (severe): Secondary | ICD-10-CM | POA: Diagnosis not present

## 2023-04-03 DIAGNOSIS — J309 Allergic rhinitis, unspecified: Secondary | ICD-10-CM | POA: Diagnosis not present

## 2023-04-03 LAB — BASIC METABOLIC PANEL
BUN: 59 mg/dL — ABNORMAL HIGH (ref 6–23)
CO2: 22 mEq/L (ref 19–32)
Calcium: 8.8 mg/dL (ref 8.4–10.5)
Chloride: 111 mEq/L (ref 96–112)
Creatinine, Ser: 7.89 mg/dL (ref 0.40–1.20)
GFR: 4.92 mL/min — CL (ref >60.00)
Glucose, Bld: 96 mg/dL (ref 70–99)
Potassium: 4.9 mEq/L (ref 3.5–5.1)
Sodium: 145 mEq/L (ref 135–145)

## 2023-04-03 MED ORDER — APIXABAN 5 MG PO TABS: 5.0000 mg | ORAL_TABLET | Freq: Two times a day (BID) | ORAL | 1 refills | Status: DC

## 2023-04-03 MED ORDER — FLUTICASONE PROPIONATE 50 MCG/ACT NA SUSP
2.0000 | Freq: Every day | NASAL | 6 refills | Status: DC
Start: 2023-04-03 — End: 2023-05-28

## 2023-04-03 NOTE — Telephone Encounter (Signed)
Noted, see lab result note.  Patient has history of CKD stage IV, and this is minimal change from her most recent creatinine.  Asymptomatic in office today.

## 2023-04-03 NOTE — Progress Notes (Signed)
Subjective:  Patient ID: Patricia Wu, female    DOB: 10-01-1956  Age: 67 y.o. MRN: [id number]  CC:  Chief Complaint  Patient presents with   Hospitalization Follow-up    Pt states she has no concerns since discharge     HPI Patricia Wu presents for   Hospital follow-up.  Atrial fibrillation Admitted March 11 through March 13.   Presented after nephrologist visit with 2 days of dyspnea and chest pressure.  Noted to have A-fib with accelerated rate.  Diltiazem x 2 with resolution of rapid ventricular response in ER.  Started on Eliquis for anticoagulation, initial Cardizem drip, then stabilized and discharged on metoprolol 50 mg twice daily for rate control.  Continued on apixaban 5 mg twice daily.  Plan for cardiology follow-up.  Cardiology visit April 2 reviewed.  Plan for cardiac monitor to determine A-fib burden - started yesterday.  Anticoagulation cost prohibitive, plan to establish with Coumadin clinic.  Amlodipine discontinued due to leg edema since starting amlodipine, HFmrEF with an ICM.  Low-salt heart healthy diet was encouraged.  Compression stockings discussed with option to referral to lymphedema clinic.  Continue same dose Lopressor due to heart rate of 56 bpm.  6 to 8-week follow-up planned. Appt with cardiology tomorrow - plan to change to coumadin. Still taking eliquis.  No new bleeding.  No palpitations.  Home BPs 140/80 yesterday.  No new med side effects.  Has not used compression stockings.   Some hoarse voice with pollen/allergies past few weeks. Eating/drinking ok. some nasal congestion. Using inhaler only if going outside - every few days for wheeze.   BP Readings from Last 3 Encounters:  04/03/23 (!) 148/88  03/27/23 (!) 161/73  03/26/23 (!) 140/72     AKI on CKD CKD stage IV. Discharge summary reviewed, creatinine 7.15.  Lasix was held initially until renal function evaluated outpatient.  Outpatient nephrology follow-up planned.  Appear to be  hypervolemic per hospital discharge summary.  Has seen nephrology since hospitalization. Appt with nephrology this Monday. Has seen them since hospitalization.  Lab Results  Component Value Date   CREATININE 7.72 (H) 03/27/2023  Range 7.15-7.72 past month. 7.25 on 3/13 to 7.72 on 4/3.  No new dyspnea, or chest pains.  Able to produce urine normally.  HD has been discussed, not yet started.  Not taking any furosemide at this time.     History Patient Active Problem List   Diagnosis Date Noted   History of Coumadin therapy 03/26/2023   Atrial fibrillation with RVR 03/04/2023   Asthma, chronic 03/04/2023   Chronic kidney disease, stage IV (severe) 02/18/2023   Anemia of chronic renal failure 02/18/2023   Acute-on-chronic kidney injury 02/15/2022   Hyperlipidemia 02/15/2022   Obesity (BMI 30-39.9) 02/15/2022   Colon cancer screening 09/21/2020   Elevated alkaline phosphatase level 09/21/2020   Essential hypertension 02/18/2018   Elevated brain natriuretic peptide (BNP) level 02/18/2018   CKD (chronic kidney disease) stage 3, GFR 30-59 ml/min (HCC) 02/18/2018   Past Medical History:  Diagnosis Date   CKD (chronic kidney disease)    Hypercholesteremia    Hypertension    Noncompliance with medication regimen    Past Surgical History:  Procedure Laterality Date   COLONOSCOPY WITH PROPOFOL N/A 10/17/2020   Procedure: COLONOSCOPY WITH PROPOFOL;  Surgeon: Lanelle Bal, DO;  [facility];  Service: Endoscopy;  Laterality: N/A;  1:00pm   POLYPECTOMY  10/17/2020   Procedure: POLYPECTOMY;  Surgeon: Lanelle Bal, DO;  [facility];  Service: Endoscopy;;   Allergies  Allergen Reactions   Ibuprofen Other (See Comments)    pcp said raises her BP   Prior to Admission medications   Medication Sig Start Date End Date Taking? Authorizing Provider  acetaminophen (TYLENOL) 325 MG tablet Take 325-650 mg by mouth every 6 (six) hours as needed (pain.).    Yes  [provider]  albuterol (VENTOLIN HFA) 108 (90 Base) MCG/ACT inhaler Inhale 1-2 puffs into the lungs every 6 (six) hours as needed for wheezing or shortness of breath. 01/04/23  Yes Shade Flood, MD  apixaban (ELIQUIS) 5 MG TABS tablet Take 1 tablet (5 mg total) by mouth 2 (two) times daily. 04/03/23  Yes Mallipeddi, Vishnu P, MD  atorvastatin (LIPITOR) 10 MG tablet TAKE 1 TABLET(10 MG) BY MOUTH DAILY 01/16/23  Yes Shade Flood, MD  calcitRIOL (ROCALTROL) 0.25 MCG capsule Take 0.25 mcg by mouth daily.   Yes [provider]  metoprolol tartrate (LOPRESSOR) 50 MG tablet Take 1 tablet (50 mg total) by mouth 2 (two) times daily. 03/06/23  Yes Johnson, Clanford L, MD  sodium bicarbonate 650 MG tablet Take 650 mg by mouth 2 (two) times daily.   Yes [provider]   Social History   Socioeconomic History   Marital status: Married    Spouse name: Not on file   Number of children: 2   Years of education: Not on file   Highest education level: Not on file  Occupational History   Not on file  Tobacco Use   Smoking status: Never    Passive exposure: Never   Smokeless tobacco: Never  Vaping Use   Vaping Use: Never used  Substance and Sexual Activity   Alcohol use: No   Drug use: No   Sexual activity: Yes  Other Topics Concern   Not on file  Social History Narrative   Not on file   Social Determinants of Health   Financial Resource Strain: Low Risk  (11/28/2022)   Overall Financial Resource Strain (CARDIA)    Difficulty of Paying Living Expenses: Not hard at all  Food Insecurity: No Food Insecurity (03/08/2023)   Hunger Vital Sign    Worried About Running Out of Food in the Last Year: Never true    Ran Out of Food in the Last Year: Never true  Transportation Needs: No Transportation Needs (03/08/2023)   PRAPARE - Administrator, Civil Service (Medical): No    Lack of Transportation (Non-Medical): No  Physical Activity: Insufficiently Active  (11/28/2022)   Exercise Vital Sign    Days of Exercise per Week: 3 days    Minutes of Exercise per Session: 30 min  Stress: No Stress Concern Present (11/28/2022)   [employer] of Occupational Health - Occupational Stress Questionnaire    Feeling of Stress : Not at all  Social Connections: Socially Integrated (11/28/2022)   Social Connection and Isolation Panel [NHANES]    Frequency of Communication with Friends and Family: Three times a week    Frequency of Social Gatherings with Friends and Family: Once a week    Attends Religious Services: More than 4 times per year    Active Member of [employer] or Organizations: Yes    Attends Banker Meetings: More than 4 times per year    Marital Status: Married  Catering manager Violence: Not At Risk (11/28/2022)   Humiliation, Afraid, Rape, and Kick questionnaire    Fear of Current  or Ex-Partner: No    Emotionally Abused: No    Physically Abused: No    Sexually Abused: No    Review of Systems  Per HPI Objective:   Vitals:   04/03/23 1128  BP: (!) 148/88  Pulse: 65  Temp: 97.8 F (36.6 C)  TempSrc: Temporal  SpO2: 96%  Weight: 204 lb 3.2 oz (92.6 kg)  Height: 5\' 3"  (1.6 m)     Physical Exam Vitals reviewed.  Constitutional:      Appearance: Normal appearance. She is well-developed.  HENT:     Head: Normocephalic and atraumatic.     Nose: Congestion (Edema of turbinates - minimal) present.  Eyes:     Conjunctiva/sclera: Conjunctivae normal.     Pupils: Pupils are equal, round, and reactive to light.  Neck:     Vascular: No carotid bruit.  Cardiovascular:     Rate and Rhythm: Normal rate and regular rhythm.     Heart sounds: Normal heart sounds.  Pulmonary:     Effort: Pulmonary effort is normal. No respiratory distress.     Breath sounds: Normal breath sounds. No stridor. No wheezing or rales.     Comments: No stridor, no rales, normal effort, speaking in full sentences, slight hoarseness of her voice.   Clearing secretions normally.  Abdominal:     Palpations: Abdomen is soft. There is no pulsatile mass.     Tenderness: There is no abdominal tenderness.  Musculoskeletal:     Right lower leg: Edema (2+ to upper tibia bilaterally.) present.     Left lower leg: Edema present.  Skin:    General: Skin is warm and dry.  Neurological:     Mental Status: She is alert and oriented to person, place, and time.  Psychiatric:        Mood and Affect: Mood normal.        Behavior: Behavior normal.       Assessment & Plan:  Waneta MartinsJanice V Emmanuel is a 67 y.o. female . Acute renal failure superimposed on stage 4 chronic kidney disease, unspecified acute renal failure type - Plan: Basic metabolic panel  -Acute renal failure on prior stage IV CKD.  Repeat BMP with slight elevated creatinine from prior but minimal change.  Discussed with her nephrologist after visit and he will see her in a few days.  She denied any new or acute symptoms and appeared to be overall euvolemic in office, with the exception of some known pedal edema.  Lungs were clear.  ER precautions have been discussed.  Given her renal function I am hesitant to adjust her medication regimen, can be reviewed at upcoming nephrology visit.  Atrial fibrillation with RVR  -Tolerating anticoagulation, sinus rhythm in office.  Keep follow-up as planned with cardiology.  Mild intermittent chronic asthma without complication  -Has albuterol if needed, and but infrequent use and clear on exam.  RTC/ER precautions.  Potential side effects of beta agonist including with her recent A-fib discussed.  Allergic rhinitis, unspecified seasonality, unspecified trigger - Plan: fluticasone (FLONASE) 50 MCG/ACT nasal spray Voice hoarseness - Plan: fluticasone (FLONASE) 50 MCG/ACT nasal spray  -Allergies likely contributing to voice hoarseness, clearing fluids, no stridor.  Trial of Flonase.  Allergen avoidance is much as possible, RTC/ER precautions.  Meds ordered  this encounter  Medications   fluticasone (FLONASE) 50 MCG/ACT nasal spray    Sig: Place 2 sprays into both nostrils daily.    Dispense:  16 g    Refill:  6  Patient Instructions  Keep follow up with kidney specialist and can discuss blood pressure med changes at that time as well as repeat bloodwork.   Flonase for allergies. Albuterol if needed, but if you require that more than 2 times per week, recheck for other meds.   Keep follow up with cardiology - try compression stockings to help with leg swelling.   Return to the clinic or go to the nearest emergency room if any of your symptoms worsen or new symptoms occur.        Signed,   Meredith Staggers, MD [facility], [facility] 04/03/23 12:03 PM

## 2023-04-03 NOTE — Telephone Encounter (Signed)
Called and spoke with patient.  She has a new coumadin appt with me tomorrow to change from Eliquis to warfarin due to cost.  She has enough Eliquis till then.  Told her to bring Eliquis bottle with her so I can see how many pills she will have left.  If she does not have enough to overlap with coumadin, samples will be provided at appt tomorrow.  She verbalized understanding.

## 2023-04-03 NOTE — Patient Instructions (Addendum)
Keep follow up with kidney specialist and can discuss blood pressure med changes at that time as well as repeat bloodwork.   Flonase for allergies. Albuterol if needed, but if you require that more than 2 times per week, recheck for other meds.   Keep follow up with cardiology - try compression stockings to help with leg swelling.   Return to the clinic or go to the nearest emergency room if any of your symptoms worsen or new symptoms occur.

## 2023-04-03 NOTE — Telephone Encounter (Signed)
[facility] calling in a critical lab result.  Caller name:Saa  Callback #: [phone]  Patient Name:Patricia Wu  Patient DOB:May 08, 1956  Patient VOJ:[id number]  Critical Result: Creatinine 7.90 GFR 4.92  Provider has been made aware via phone note and direct message (teams/verbal)

## 2023-04-04 ENCOUNTER — Encounter: Payer: No Typology Code available for payment source | Admitting: *Deleted

## 2023-04-04 DIAGNOSIS — I4891 Unspecified atrial fibrillation: Secondary | ICD-10-CM

## 2023-04-04 DIAGNOSIS — Z9229 Personal history of other drug therapy: Secondary | ICD-10-CM

## 2023-04-04 DIAGNOSIS — Z5181 Encounter for therapeutic drug level monitoring: Secondary | ICD-10-CM | POA: Diagnosis not present

## 2023-04-04 LAB — POCT INR: INR: 1.6 — AB (ref 2.0–3.0)

## 2023-04-04 MED ORDER — WARFARIN SODIUM 2.5 MG PO TABS: ORAL_TABLET | ORAL | 3 refills | Status: DC

## 2023-04-04 NOTE — Patient Instructions (Signed)
Here to change from Eliquis to warfarin due to cost. She ran out of Eliquis yesterday Start warfarin 2.5mg   (1 tablet) daily at night Recheck INR on 04/09/23

## 2023-04-05 ENCOUNTER — Encounter: Payer: Self-pay | Admitting: Family Medicine

## 2023-04-08 ENCOUNTER — Telehealth: Payer: Self-pay | Admitting: Nurse Practitioner

## 2023-04-08 DIAGNOSIS — D631 Anemia in chronic kidney disease: Secondary | ICD-10-CM | POA: Diagnosis not present

## 2023-04-08 DIAGNOSIS — I120 Hypertensive chronic kidney disease with stage 5 chronic kidney disease or end stage renal disease: Secondary | ICD-10-CM | POA: Diagnosis not present

## 2023-04-08 DIAGNOSIS — N185 Chronic kidney disease, stage 5: Secondary | ICD-10-CM | POA: Diagnosis not present

## 2023-04-08 DIAGNOSIS — R809 Proteinuria, unspecified: Secondary | ICD-10-CM | POA: Diagnosis not present

## 2023-04-08 DIAGNOSIS — N2581 Secondary hyperparathyroidism of renal origin: Secondary | ICD-10-CM | POA: Diagnosis not present

## 2023-04-08 DIAGNOSIS — I4891 Unspecified atrial fibrillation: Secondary | ICD-10-CM | POA: Diagnosis not present

## 2023-04-08 DIAGNOSIS — N179 Acute kidney failure, unspecified: Secondary | ICD-10-CM | POA: Diagnosis not present

## 2023-04-08 NOTE — Telephone Encounter (Signed)
   Pre-operative Risk Assessment    Patient Name: Patricia Wu  DOB: 1956/08/12 MRN: [id number]      Request for Surgical Clearance    Procedure:   Left Arm Arteriovenous Gortex Graft  Date of Surgery:  Clearance TBD                                 Surgeon:  Dr. Gretta Began Surgeon's Group or Practice Name:  [facility] Phone number:  [phone] Fax number:  [phone]   Type of Clearance Requested:   - Medical  - Pharmacy:  Hold Warfarin (Coumadin) We would like the patient to stop 3 days prior to their procedure.   Type of Anesthesia:  Not Indicated   Additional requests/questions:    Signed, Seymour Bars   04/08/2023, 1:48 PM

## 2023-04-09 ENCOUNTER — Encounter (INDEPENDENT_AMBULATORY_CARE_PROVIDER_SITE_OTHER): Payer: No Typology Code available for payment source | Admitting: *Deleted

## 2023-04-09 DIAGNOSIS — Z5181 Encounter for therapeutic drug level monitoring: Secondary | ICD-10-CM

## 2023-04-09 DIAGNOSIS — I4891 Unspecified atrial fibrillation: Secondary | ICD-10-CM | POA: Diagnosis not present

## 2023-04-09 LAB — POCT INR: INR: 1.3 — AB (ref 2.0–3.0)

## 2023-04-09 NOTE — Patient Instructions (Signed)
Here to change from Eliquis to warfarin due to cost. She ran out of Eliquis yesterday Increase warfarin to 1 tablet daily except 2 tablets on Tuesdays, Thursdays and Saturdays Recheck INR on 04/15/23

## 2023-04-09 NOTE — Telephone Encounter (Signed)
Patient with diagnosis of atrial fibrillation on warfarin for anticoagulation.    Procedure:   Left Arm Arteriovenous Gortex Graft   Date of Surgery:  Clearance TBD   CHA2DS2-VASc Score = 4   This indicates a 4.8% annual risk of stroke. The patient's score is based upon: CHF History: 1 HTN History: 1 Diabetes History: 0 Stroke History: 0 Vascular Disease History: 0 Age Score: 1 Gender Score: 1    CrCl 10 Platelet count 273  Per office protocol, patient can hold warfarin for 3 days prior to procedure.   Patient will not need bridging with Lovenox (enoxaparin) around procedure.  **This guidance is not considered finalized until pre-operative APP has relayed final recommendations.**

## 2023-04-15 ENCOUNTER — Encounter (INDEPENDENT_AMBULATORY_CARE_PROVIDER_SITE_OTHER): Payer: No Typology Code available for payment source | Admitting: *Deleted

## 2023-04-15 DIAGNOSIS — Z5181 Encounter for therapeutic drug level monitoring: Secondary | ICD-10-CM | POA: Diagnosis not present

## 2023-04-15 DIAGNOSIS — I4891 Unspecified atrial fibrillation: Secondary | ICD-10-CM | POA: Diagnosis not present

## 2023-04-15 LAB — POCT INR: INR: 2.6 (ref 2.0–3.0)

## 2023-04-15 NOTE — Patient Instructions (Signed)
Here to change from Eliquis to warfarin due to cost. Continue warfarin 1 tablet daily except 2 tablets on Tuesdays, Thursdays and Saturdays Recheck INR on 04/23/23

## 2023-04-17 ENCOUNTER — Telehealth: Payer: Self-pay | Admitting: *Deleted

## 2023-04-17 NOTE — Telephone Encounter (Signed)
Reports taking a shower last night around 10:56 pm when alert reported. Says she didn't feel any symptoms during that time and that she has no symptoms at this time. Confirmed that she is taking warfarin as directed and lopressor 50 mg BID.

## 2023-04-17 NOTE — Telephone Encounter (Signed)
Cardiac Monitor Alert   Date of alert:  04/16/2023 :56 pm   Patient Name: [name] DOB: 02-23-1956 MRN: [id number]   [facility] Cardiologist: Luane School, MD [facility] EP:  none   Monitor Information: Long Term Monitor-Live Telemetry [BodyGuardian Mini Plus Reason:  A-fib burden Ordering provider: Sharlene Dory, NP   Alert Pause(s) -  Atrial flutter - Atrial Flutter with Variable Conduction w/Run of V-Tach (15 beats) Atria Fibrillation    Next Cardiology Appointment   Date: 05/07/2023 :0 pm with Philis Nettle  Other - Left message on voicemail for patient to call office

## 2023-04-18 ENCOUNTER — Telehealth: Payer: Self-pay | Admitting: Nurse Practitioner

## 2023-04-18 NOTE — Telephone Encounter (Signed)
Called patient 04/17/2023 and discussed monitor critical report, stated she was asymptomatic. Went over and reviewed cardiac clearance. Discussed case with Dr. Jenene Slicker who stated patient can proceed to surgery despite high risk. She denied chest pain, shortness of breath, signs/symptoms of ADHF, and no significant valvular abnormalities. Discussed RCRI risk with patient and she verbalized understanding and is agreeable to proceed with pending procedure. I routed my note with official clearance to requesting party.   Sharlene Dory, NP

## 2023-04-22 ENCOUNTER — Telehealth: Payer: Self-pay

## 2023-04-22 NOTE — Telephone Encounter (Signed)
-----   Message from Sharlene Dory, NP sent at 04/22/2023  9:10 AM EDT ----- Regarding: FW: Schedule follow-up to May 17th at 4 PM Please schedule follow-up with me on May 10, 2023 at 4:00 PM per patient's preference instead of May 14th.   Thanks!   Best,  Sharlene Dory, NP  ----- Message ----- From: Sharlene Dory, NP Sent: 04/18/2023   8:41 AM EDT To: Sharlene Dory, NP Subject: Schedule follow-up to May 17th at 4 PM

## 2023-04-22 NOTE — Telephone Encounter (Signed)
Patient states that she wants to keep her 5/14 appointment instead of 5/17.

## 2023-04-23 ENCOUNTER — Ambulatory Visit: Payer: No Typology Code available for payment source | Attending: Internal Medicine | Admitting: *Deleted

## 2023-04-23 DIAGNOSIS — Z5181 Encounter for therapeutic drug level monitoring: Secondary | ICD-10-CM

## 2023-04-23 DIAGNOSIS — I4891 Unspecified atrial fibrillation: Secondary | ICD-10-CM | POA: Diagnosis not present

## 2023-04-23 LAB — POCT INR: INR: 2 (ref 2.0–3.0)

## 2023-04-23 NOTE — Patient Instructions (Signed)
Continue warfarin 1 tablet daily except 2 tablets on Tuesdays, Thursdays and Saturdays Recheck INR in 3 wks

## 2023-04-24 ENCOUNTER — Encounter (HOSPITAL_COMMUNITY): Payer: No Typology Code available for payment source | Attending: Nephrology

## 2023-04-24 ENCOUNTER — Telehealth: Payer: Self-pay

## 2023-04-24 DIAGNOSIS — I428 Other cardiomyopathies: Secondary | ICD-10-CM | POA: Insufficient documentation

## 2023-04-24 DIAGNOSIS — Z7901 Long term (current) use of anticoagulants: Secondary | ICD-10-CM | POA: Insufficient documentation

## 2023-04-24 DIAGNOSIS — R600 Localized edema: Secondary | ICD-10-CM | POA: Insufficient documentation

## 2023-04-24 DIAGNOSIS — N184 Chronic kidney disease, stage 4 (severe): Secondary | ICD-10-CM | POA: Insufficient documentation

## 2023-04-24 DIAGNOSIS — I5023 Acute on chronic systolic (congestive) heart failure: Secondary | ICD-10-CM | POA: Insufficient documentation

## 2023-04-24 DIAGNOSIS — I4891 Unspecified atrial fibrillation: Secondary | ICD-10-CM | POA: Insufficient documentation

## 2023-04-24 DIAGNOSIS — I10 Essential (primary) hypertension: Secondary | ICD-10-CM | POA: Insufficient documentation

## 2023-04-24 NOTE — Telephone Encounter (Signed)
Called patient to reschedule missed appointment. No answer. Left detailed voicemail requesting call back (ok per DPR). Will also send MyChart message.  Wyvonne Lenz, RN

## 2023-05-06 DIAGNOSIS — R809 Proteinuria, unspecified: Secondary | ICD-10-CM | POA: Diagnosis not present

## 2023-05-06 DIAGNOSIS — I120 Hypertensive chronic kidney disease with stage 5 chronic kidney disease or end stage renal disease: Secondary | ICD-10-CM | POA: Diagnosis not present

## 2023-05-06 DIAGNOSIS — N185 Chronic kidney disease, stage 5: Secondary | ICD-10-CM | POA: Diagnosis not present

## 2023-05-06 DIAGNOSIS — I4891 Unspecified atrial fibrillation: Secondary | ICD-10-CM | POA: Diagnosis not present

## 2023-05-06 DIAGNOSIS — N2581 Secondary hyperparathyroidism of renal origin: Secondary | ICD-10-CM | POA: Diagnosis not present

## 2023-05-06 DIAGNOSIS — N179 Acute kidney failure, unspecified: Secondary | ICD-10-CM | POA: Diagnosis not present

## 2023-05-06 DIAGNOSIS — D631 Anemia in chronic kidney disease: Secondary | ICD-10-CM | POA: Diagnosis not present

## 2023-05-07 ENCOUNTER — Encounter: Payer: No Typology Code available for payment source | Admitting: Nurse Practitioner

## 2023-05-07 ENCOUNTER — Encounter: Payer: Self-pay | Admitting: Nurse Practitioner

## 2023-05-07 ENCOUNTER — Inpatient Hospital Stay (HOSPITAL_COMMUNITY)
Admission: EM | Admit: 2023-05-07 | Discharge: 2023-05-14 | DRG: 291 | Disposition: A | Discharge status: Home / Self Care | Payer: No Typology Code available for payment source | Attending: Internal Medicine | Admitting: Internal Medicine

## 2023-05-07 ENCOUNTER — Ambulatory Visit: Payer: No Typology Code available for payment source | Admitting: Nurse Practitioner

## 2023-05-07 ENCOUNTER — Emergency Department (HOSPITAL_COMMUNITY): Payer: No Typology Code available for payment source

## 2023-05-07 ENCOUNTER — Other Ambulatory Visit: Payer: Self-pay

## 2023-05-07 VITALS — BP 160/100 | HR 106 | Ht 63.0 in | Wt 233.2 lb

## 2023-05-07 DIAGNOSIS — N186 End stage renal disease: Secondary | ICD-10-CM | POA: Diagnosis not present

## 2023-05-07 DIAGNOSIS — I5043 Acute on chronic combined systolic (congestive) and diastolic (congestive) heart failure: Secondary | ICD-10-CM | POA: Diagnosis not present

## 2023-05-07 DIAGNOSIS — N185 Chronic kidney disease, stage 5: Secondary | ICD-10-CM | POA: Diagnosis not present

## 2023-05-07 DIAGNOSIS — I120 Hypertensive chronic kidney disease with stage 5 chronic kidney disease or end stage renal disease: Secondary | ICD-10-CM | POA: Diagnosis not present

## 2023-05-07 DIAGNOSIS — J45909 Unspecified asthma, uncomplicated: Secondary | ICD-10-CM | POA: Diagnosis not present

## 2023-05-07 DIAGNOSIS — I5023 Acute on chronic systolic (congestive) heart failure: Secondary | ICD-10-CM

## 2023-05-07 DIAGNOSIS — I509 Heart failure, unspecified: Secondary | ICD-10-CM | POA: Diagnosis not present

## 2023-05-07 DIAGNOSIS — Z7901 Long term (current) use of anticoagulants: Secondary | ICD-10-CM | POA: Diagnosis not present

## 2023-05-07 DIAGNOSIS — I10 Essential (primary) hypertension: Secondary | ICD-10-CM | POA: Diagnosis not present

## 2023-05-07 DIAGNOSIS — R600 Localized edema: Secondary | ICD-10-CM | POA: Diagnosis not present

## 2023-05-07 DIAGNOSIS — Z6841 Body Mass Index (BMI) 40.0 and over, adult: Secondary | ICD-10-CM

## 2023-05-07 DIAGNOSIS — I4891 Unspecified atrial fibrillation: Secondary | ICD-10-CM

## 2023-05-07 DIAGNOSIS — I4819 Other persistent atrial fibrillation: Secondary | ICD-10-CM | POA: Diagnosis not present

## 2023-05-07 DIAGNOSIS — N289 Disorder of kidney and ureter, unspecified: Secondary | ICD-10-CM

## 2023-05-07 DIAGNOSIS — Z886 Allergy status to analgesic agent status: Secondary | ICD-10-CM | POA: Diagnosis not present

## 2023-05-07 DIAGNOSIS — E7800 Pure hypercholesterolemia, unspecified: Secondary | ICD-10-CM | POA: Diagnosis not present

## 2023-05-07 DIAGNOSIS — Z79899 Other long term (current) drug therapy: Secondary | ICD-10-CM

## 2023-05-07 DIAGNOSIS — Z91148 Patient's other noncompliance with medication regimen for other reason: Secondary | ICD-10-CM | POA: Diagnosis not present

## 2023-05-07 DIAGNOSIS — I132 Hypertensive heart and chronic kidney disease with heart failure and with stage 5 chronic kidney disease, or end stage renal disease: Principal | ICD-10-CM | POA: Diagnosis present

## 2023-05-07 DIAGNOSIS — E669 Obesity, unspecified: Secondary | ICD-10-CM | POA: Diagnosis not present

## 2023-05-07 DIAGNOSIS — E66812 Obesity, class 2: Secondary | ICD-10-CM

## 2023-05-07 DIAGNOSIS — I428 Other cardiomyopathies: Secondary | ICD-10-CM

## 2023-05-07 DIAGNOSIS — N2581 Secondary hyperparathyroidism of renal origin: Secondary | ICD-10-CM | POA: Diagnosis not present

## 2023-05-07 DIAGNOSIS — I5021 Acute systolic (congestive) heart failure: Secondary | ICD-10-CM | POA: Diagnosis not present

## 2023-05-07 DIAGNOSIS — D649 Anemia, unspecified: Secondary | ICD-10-CM | POA: Diagnosis not present

## 2023-05-07 DIAGNOSIS — E6609 Other obesity due to excess calories: Secondary | ICD-10-CM

## 2023-05-07 DIAGNOSIS — N184 Chronic kidney disease, stage 4 (severe): Secondary | ICD-10-CM | POA: Diagnosis not present

## 2023-05-07 DIAGNOSIS — E8779 Other fluid overload: Secondary | ICD-10-CM | POA: Diagnosis not present

## 2023-05-07 DIAGNOSIS — E872 Acidosis, unspecified: Secondary | ICD-10-CM | POA: Diagnosis not present

## 2023-05-07 DIAGNOSIS — N250 Renal osteodystrophy: Secondary | ICD-10-CM | POA: Diagnosis not present

## 2023-05-07 DIAGNOSIS — D631 Anemia in chronic kidney disease: Secondary | ICD-10-CM | POA: Diagnosis present

## 2023-05-07 DIAGNOSIS — N179 Acute kidney failure, unspecified: Secondary | ICD-10-CM | POA: Diagnosis not present

## 2023-05-07 DIAGNOSIS — E8770 Fluid overload, unspecified: Secondary | ICD-10-CM | POA: Diagnosis not present

## 2023-05-07 DIAGNOSIS — Z992 Dependence on renal dialysis: Secondary | ICD-10-CM | POA: Diagnosis not present

## 2023-05-07 DIAGNOSIS — R0602 Shortness of breath: Secondary | ICD-10-CM | POA: Diagnosis not present

## 2023-05-07 DIAGNOSIS — Z4901 Encounter for fitting and adjustment of extracorporeal dialysis catheter: Secondary | ICD-10-CM | POA: Diagnosis not present

## 2023-05-07 LAB — COMPREHENSIVE METABOLIC PANEL
ALT: 50 U/L — ABNORMAL HIGH (ref 0–44)
AST: 28 U/L (ref 15–41)
Albumin: 2.8 g/dL — ABNORMAL LOW (ref 3.5–5.0)
Alkaline Phosphatase: 320 U/L — ABNORMAL HIGH (ref 38–126)
Anion gap: 14 (ref 5–15)
BUN: 99 mg/dL — ABNORMAL HIGH (ref 8–23)
CO2: 18 mmol/L — ABNORMAL LOW (ref 22–32)
Calcium: 8.2 mg/dL — ABNORMAL LOW (ref 8.9–10.3)
Chloride: 109 mmol/L (ref 98–111)
Creatinine, Ser: 11.44 mg/dL — ABNORMAL HIGH (ref 0.44–1.00)
GFR, Estimated: 3 mL/min — ABNORMAL LOW (ref >60)
Glucose, Bld: 114 mg/dL — ABNORMAL HIGH (ref 70–99)
Potassium: 4.6 mmol/L (ref 3.5–5.1)
Sodium: 141 mmol/L (ref 135–145)
Total Bilirubin: 0.7 mg/dL (ref 0.3–1.2)
Total Protein: 6.6 g/dL (ref 6.5–8.1)

## 2023-05-07 LAB — PROTIME-INR
INR: 1.6 — ABNORMAL HIGH (ref 0.8–1.2)
Prothrombin Time: 19.1 seconds — ABNORMAL HIGH (ref 11.4–15.2)

## 2023-05-07 LAB — CBC
HCT: 39.6 % (ref 36.0–46.0)
Hemoglobin: 11.9 g/dL — ABNORMAL LOW (ref 12.0–15.0)
MCH: 28.7 pg (ref 26.0–34.0)
MCHC: 30.1 g/dL (ref 30.0–36.0)
MCV: 95.4 fL (ref 80.0–100.0)
Platelets: 193 10*3/uL (ref 150–400)
RBC: 4.15 MIL/uL (ref 3.87–5.11)
RDW: 16.5 % — ABNORMAL HIGH (ref 11.5–15.5)
WBC: 8.5 10*3/uL (ref 4.0–10.5)
nRBC: 0 % (ref 0.0–0.2)

## 2023-05-07 LAB — BRAIN NATRIURETIC PEPTIDE: B Natriuretic Peptide: 3927 pg/mL — ABNORMAL HIGH (ref 0.0–100.0)

## 2023-05-07 LAB — TROPONIN I (HIGH SENSITIVITY): Troponin I (High Sensitivity): 20 ng/L — ABNORMAL HIGH (ref <18)

## 2023-05-07 MED ORDER — CALCITRIOL 0.25 MCG PO CAPS
0.5000 ug | ORAL_CAPSULE | Freq: Every day | ORAL | Status: DC
  Administered 2023-05-08 – 2023-05-14 (×7): 0.5 ug via ORAL
  Filled 2023-05-07 (×7): qty 2

## 2023-05-07 MED ORDER — ONDANSETRON HCL 4 MG PO TABS: 4.0000 mg | ORAL_TABLET | Freq: Four times a day (QID) | ORAL | Status: DC | PRN

## 2023-05-07 MED ORDER — POLYETHYLENE GLYCOL 3350 17 G PO PACK: 17.0000 g | PACK | Freq: Every day | ORAL | Status: DC | PRN

## 2023-05-07 MED ORDER — SODIUM BICARBONATE 650 MG PO TABS
650.0000 mg | ORAL_TABLET | Freq: Two times a day (BID) | ORAL | Status: DC
  Administered 2023-05-08 (×3): 650 mg via ORAL
  Filled 2023-05-07 (×3): qty 1

## 2023-05-07 MED ORDER — ACETAMINOPHEN 325 MG PO TABS: 650.0000 mg | ORAL_TABLET | Freq: Four times a day (QID) | ORAL | Status: DC | PRN

## 2023-05-07 MED ORDER — FUROSEMIDE 10 MG/ML IJ SOLN: 60.0000 mg | Freq: Two times a day (BID) | INTRAMUSCULAR | Status: DC

## 2023-05-07 MED ORDER — ACETAMINOPHEN 650 MG RE SUPP: 650.0000 mg | Freq: Four times a day (QID) | RECTAL | Status: DC | PRN

## 2023-05-07 MED ORDER — FUROSEMIDE 10 MG/ML IJ SOLN
60.0000 mg | Freq: Once | INTRAMUSCULAR | Status: AC
  Administered 2023-05-07: 60 mg via INTRAVENOUS
  Filled 2023-05-07: qty 6

## 2023-05-07 MED ORDER — ONDANSETRON HCL 4 MG/2ML IJ SOLN: 4.0000 mg | Freq: Four times a day (QID) | INTRAMUSCULAR | Status: DC | PRN

## 2023-05-07 NOTE — ED Triage Notes (Signed)
Pt sent by cardiologist for "abnormal labs"  No complaints.  States she was told she had "too much fluid:"

## 2023-05-07 NOTE — Assessment & Plan Note (Addendum)
-  With 3+ pitting bilateral lower extremity edema, progressive chronic renal disease.  BNP elevated at 3927.   -Recent echo 02/2023 EF of 40 to 45%.  -No longer on Lasix, as she will need HD to effect significant change -Strict input output, daily weights, daily BMP

## 2023-05-07 NOTE — Assessment & Plan Note (Addendum)
-  Rate controlled on lopressor.   -On anticoagulation with warfarin prior to admission, INR 1.6. -It appears to be reasonable to resume Coumadin now, pharmacy to dose

## 2023-05-07 NOTE — Assessment & Plan Note (Addendum)
-  Patient with known progressive CKD -Presented with progressively worsening creatinine as well as marked volume overload -Her symptoms appear to all be related to volume overload -Had temporary HD catheter placed for initiation of HD -Will needs vascular access mapping for fistula -Will need clipping -Management as per nephrology  -Hold Warfarin -Resume oral sodium bicarbonate

## 2023-05-07 NOTE — ED Provider Notes (Addendum)
[place] EMERGENCY DEPARTMENT AT [facility] Provider Note   CSN: [id number] Arrival date & time: 05/07/23  1612     History  Chief Complaint  Patient presents with   Abnormal Labs    Patricia Wu is a 67 y.o. female.  Patient with hx ckd, chf, c/o increased bilateral leg edema and ~ 15 lb weight increase - increase was sent from cardiology office for admission for CHF.  Pt indicates compliant w home meds, but had not taken today. Denies chest pain or discomfort. No orthopnea or pnd. Denies headache. No abd pain or nvd. No dysuria or gu c/o. Indicates making normal amount of urine.   The history is provided by the patient and medical records.       Home Medications Prior to Admission medications   Medication Sig Start Date End Date Taking? Authorizing Provider  acetaminophen (TYLENOL) 325 MG tablet Take 325-650 mg by mouth every 6 (six) hours as needed (pain.).     [provider]  albuterol (VENTOLIN HFA) 108 (90 Base) MCG/ACT inhaler Inhale 1-2 puffs into the lungs every 6 (six) hours as needed for wheezing or shortness of breath. 01/04/23   Shade Flood, MD  atorvastatin (LIPITOR) 10 MG tablet TAKE 1 TABLET(10 MG) BY MOUTH DAILY 01/16/23   Shade Flood, MD  calcitRIOL (ROCALTROL) 0.25 MCG capsule Take 0.25 mcg by mouth daily.    [provider]  fluticasone (FLONASE) 50 MCG/ACT nasal spray Place 2 sprays into both nostrils daily. 04/03/23   Shade Flood, MD  furosemide (LASIX) 40 MG tablet Take 40 mg by mouth daily. Patient not taking: Reported on 05/07/2023 05/06/23   [provider]  metoprolol tartrate (LOPRESSOR) 50 MG tablet Take 1 tablet (50 mg total) by mouth 2 (two) times daily. 03/06/23   Johnson, Clanford L, MD  sodium bicarbonate 650 MG tablet Take 650 mg by mouth 2 (two) times daily.    [provider]  warfarin (COUMADIN) 2.5 MG tablet Take 1 tablet daily or as directed by Coumadin Clinic 04/04/23    Mallipeddi, Vishnu P, MD      Allergies    Ibuprofen    Review of Systems   Review of Systems  Constitutional:  Negative for chills and fever.  HENT:  Negative for sore throat.   Eyes:  Negative for redness.  Respiratory:  Negative for cough and shortness of breath.   Cardiovascular:  Positive for leg swelling. Negative for chest pain.  Gastrointestinal:  Negative for abdominal pain and vomiting.  Genitourinary:  Negative for decreased urine volume, dysuria and flank pain.  Musculoskeletal:  Negative for back pain and neck pain.  Skin:  Negative for rash.  Neurological:  Negative for headaches.  Hematological:  Does not bruise/bleed easily.  Psychiatric/Behavioral:  Negative for confusion.     Physical Exam Updated Vital Signs BP (!) 151/122 (BP Location: Left Arm)   Pulse 79   Temp 98.2 F (36.8 C) (Oral)   Resp 17   SpO2 97%  Physical Exam Vitals and nursing note reviewed.  Constitutional:      Appearance: Normal appearance. She is well-developed.  HENT:     Head: Atraumatic.     Nose: Nose normal.     Mouth/Throat:     Mouth: Mucous membranes are moist.  Eyes:     General: No scleral icterus.    Conjunctiva/sclera: Conjunctivae normal.  Neck:     Trachea: No tracheal deviation.  Cardiovascular:  Rate and Rhythm: Normal rate. Rhythm irregular.     Pulses: Normal pulses.     Heart sounds: Normal heart sounds. No murmur heard.    No friction rub. No gallop.  Pulmonary:     Effort: Pulmonary effort is normal. No respiratory distress.     Breath sounds: Normal breath sounds.  Abdominal:     General: Bowel sounds are normal. There is no distension.     Palpations: Abdomen is soft.     Tenderness: There is no abdominal tenderness.  Genitourinary:    Comments: No cva tenderness.  Musculoskeletal:     Cervical back: Normal range of motion and neck supple. No rigidity. No muscular tenderness.     Right lower leg: Edema present.     Left lower leg: Edema  present.  Skin:    General: Skin is warm and dry.     Findings: No rash.  Neurological:     Mental Status: She is alert.     Comments: Alert, speech normal.   Psychiatric:        Mood and Affect: Mood normal.     ED Results / Procedures / Treatments   Labs (all labs ordered are listed, but only abnormal results are displayed) Results for orders placed or performed during the hospital encounter of 05/07/23  CBC  Result Value Ref Range   WBC 8.5 4.0 - 10.5 K/uL   RBC 4.15 3.87 - 5.11 MIL/uL   Hemoglobin 11.9 (L) 12.0 - 15.0 g/dL   HCT 16.1 09.6 - 04.5 %   MCV 95.4 80.0 - 100.0 fL   MCH 28.7 26.0 - 34.0 pg   MCHC 30.1 30.0 - 36.0 g/dL   RDW 40.9 (H) 81.1 - 91.4 %   Platelets 193 150 - 400 K/uL   nRBC 0.0 0.0 - 0.2 %  Comprehensive metabolic panel  Result Value Ref Range   Sodium 141 135 - 145 mmol/L   Potassium 4.6 3.5 - 5.1 mmol/L   Chloride 109 98 - 111 mmol/L   CO2 18 (L) 22 - 32 mmol/L   Glucose, Bld 114 (H) 70 - 99 mg/dL   BUN 99 (H) 8 - 23 mg/dL   Creatinine, Ser 78.29 (H) 0.44 - 1.00 mg/dL   Calcium 8.2 (L) 8.9 - 10.3 mg/dL   Total Protein 6.6 6.5 - 8.1 g/dL   Albumin 2.8 (L) 3.5 - 5.0 g/dL   AST 28 15 - 41 U/L   ALT 50 (H) 0 - 44 U/L   Alkaline Phosphatase 320 (H) 38 - 126 U/L   Total Bilirubin 0.7 0.3 - 1.2 mg/dL   GFR, Estimated 3 (L) >60 mL/min   Anion gap 14 5 - 15  Brain natriuretic peptide  Result Value Ref Range   B Natriuretic Peptide 3,927.0 (H) 0.0 - 100.0 pg/mL  Protime-INR  Result Value Ref Range   Prothrombin Time 19.1 (H) 11.4 - 15.2 seconds   INR 1.6 (H) 0.8 - 1.2  Troponin I (High Sensitivity)  Result Value Ref Range   Troponin I (High Sensitivity) 20 (H) <18 ng/L   DG Chest Port 1 View  Result Date: 05/07/2023 CLINICAL DATA:  Shortness of breath. EXAM: PORTABLE CHEST 1 VIEW COMPARISON:  03/04/2023 FINDINGS: Stable moderate cardiomegaly and pulmonary interstitial prominence. No evidence of acute infiltrate or pleural effusion.  IMPRESSION: Stable cardiomegaly and pulmonary interstitial prominence. No acute findings. Electronically Signed   By: Danae Orleans M.D.   On: 05/07/2023 18:53  EKG None  Radiology DG Chest Port 1 View  Result Date: 05/07/2023 CLINICAL DATA:  Shortness of breath. EXAM: PORTABLE CHEST 1 VIEW COMPARISON:  03/04/2023 FINDINGS: Stable moderate cardiomegaly and pulmonary interstitial prominence. No evidence of acute infiltrate or pleural effusion. IMPRESSION: Stable cardiomegaly and pulmonary interstitial prominence. No acute findings. Electronically Signed   By: Danae Orleans M.D.   On: 05/07/2023 18:53    Procedures Procedures    Medications Ordered in ED Medications - No data to display  ED Course/ Medical Decision Making/ A&P                             Medical Decision Making Problems Addressed: Acute on chronic combined systolic and diastolic CHF (congestive heart failure) (HCC): acute illness or injury with systemic symptoms that poses a threat to life or bodily functions Atrial fibrillation, controlled (HCC): chronic illness or injury with exacerbation, progression, or side effects of treatment that poses a threat to life or bodily functions Hypervolemia, unspecified hypervolemia type: acute illness or injury with systemic symptoms that poses a threat to life or bodily functions Stage 5 chronic kidney disease not on chronic dialysis [facility]): chronic illness or injury with exacerbation, progression, or side effects of treatment that poses a threat to life or bodily functions Uncontrolled hypertension: chronic illness or injury with exacerbation, progression, or side effects of treatment that poses a threat to life or bodily functions  Amount and/or Complexity of Data Reviewed Independent Historian:     Details: Family, card.  External Data Reviewed: labs and notes. Labs: ordered. Decision-making details documented in ED Course. Radiology: ordered and independent interpretation  performed. Decision-making details documented in ED Course. ECG/medicine tests: ordered and independent interpretation performed. Decision-making details documented in ED Course.  Risk Prescription drug management. Decision regarding hospitalization.   Iv ns. Continuous pulse ox and cardiac monitoring. Labs ordered/sent. Imaging ordered.   Differential diagnosis includes CHF, ckd, etc. Dispo decision including potential need for admission considered - will get labs and imaging and reassess.   Reviewed nursing notes and prior charts for additional history. External reports reviewed. Additional history from: fam.   Cardiac monitor: sinus rhythm, rate 80.  Labs reviewed/interpreted by me - wbc and hgb normal. Bnp very high. Ckd/worsening.   Xrays reviewed/interpreted by me - vascular congestion.   Lasix iv.   Pt saw cardiology just prior to ED arrival - recs for admitting team in chart.   Hospitalists consulted for admission. Discussed pt, will admit. Also discussed that pt will need nephrology consult in AM, hospitalist indicates she will initiate consult.    CRITICAL CARE RE: acute on chronic chf, acute on chronic kidney disease, fluid overload, uncontrolled htn.  Performed by: Suzi Roots Total critical care time: 45 minutes Critical care time was exclusive of separately billable procedures and treating other patients. Critical care was necessary to treat or prevent imminent or life-threatening deterioration. Critical care was time spent personally by me on the following activities: development of treatment plan with patient and/or surrogate as well as nursing, discussions with consultants, evaluation of patient's response to treatment, examination of patient, obtaining history from patient or surrogate, ordering and performing treatments and interventions, ordering and review of laboratory studies, ordering and review of radiographic studies, pulse oximetry and re-evaluation of  patient's condition.          Final Clinical Impression(s) / ED Diagnoses Final diagnoses:  None    Rx / DC  Orders ED Discharge Orders     None           Cathren Laine, MD 05/07/23 2209

## 2023-05-07 NOTE — Progress Notes (Signed)
Office Visit    Patient Name: Patricia Wu Date of Encounter: 03/26/2023  PCP:  Shade Flood, MD   [facility]  Cardiologist:  Marjo Bicker, MD  Advanced Practice Provider:  Sharlene Dory, NP Electrophysiologist:  None   Chief Complaint    Patricia Wu is a 67 y.o. female with a hx of HFmrEF, HTN, HLD, asthma, obesity, and CKD IV (sees nephrology), and A-fib, who presents today for follow-up.   Past Medical History    Past Medical History:  Diagnosis Date   CKD (chronic kidney disease)    Hypercholesteremia    Hypertension    Noncompliance with medication regimen    Past Surgical History:  Procedure Laterality Date   COLONOSCOPY WITH PROPOFOL N/A 10/17/2020   Procedure: COLONOSCOPY WITH PROPOFOL;  Surgeon: Lanelle Bal, DO;  [facility];  Service: Endoscopy;  Laterality: N/A;  1:00pm   POLYPECTOMY  10/17/2020   Procedure: POLYPECTOMY;  Surgeon: Lanelle Bal, DO;  Location: [facility];  Service: Endoscopy;;    Allergies  Allergies  Allergen Reactions   Ibuprofen Other (See Comments)    pcp said raises her BP    History of Present Illness    Patricia Wu is a 67 y.o. female with a PMH as mentioned above.  Previous patient of Dr. Purvis Sheffield.  Last seen by him in 2019.   Admitted in March 2024 with A-fib with RVR.  Was found to be be in A-fib with RVR at nephrologist office.  Cardiology was consulted for treatment.  Became stable after 2 doses of IV diltiazem.  Eliquis was initiated.  Heart rate stabilized on metoprolol 50 mg twice daily.  Hospital course complicated by AKI on CKD stage IV.  Baseline creatinine around 3.5.  Per patient's report, she is being evaluated for AV graft.  Nephrology was consulted, was told to follow-up outpatient.  I last saw her for outpatient follow-up on 03/26/2023. She noticed leg swelling after medication changes. Was requesting to switch to different anticoagulant,  d/t price of Eliquis. Denied any palpitations. Denied any chest pain, shortness of breath, syncope, presyncope, dizziness, orthopnea, PND, acute bleeding, or claudication. Weight was up 18 lbs from 12/2022. Compliant with medications. Was seeing Nephrology. Amlodipine d/c due to leg edema. Pt set up to get established with Coumadin Clinic. Preventice monitor arranged.   Received cardiac monitor alert via live telemetry with conduction with run of V. tach around 15 beats with atrial fibrillation/a flutter.  RN called patient and stated she did not feel any symptoms at that time, was asymptomatic -see telephone note dated April 17, 2023.  Today she presents for follow-up with her husband.  Continues to do about the same since I last saw her.  She has not noticed any chest pain.  Does feel fatigued but denies any shortness of breath.  Is symptomatic with palpitations or tachycardia.  States she has not been taking Lasix due to her kidney disease.  Weight is up over 15 pounds since I last saw her.  States she has not been seen by vascular surgery for pending left arm AV Gortex graft even though I routed my preoperative cardiovascular risk assessment note to VVS surgeon after last office visit.  States she has not heard about this upcoming surgery. Denies any chest pain, shortness of breath, palpitations, syncope, presyncope, dizziness, orthopnea, PND, acute bleeding, or claudication.  EKG today reveals A-fib with RVR, 120 bpm, T wave abnormality  noted.  EKGs/Labs/Other Studies Reviewed:   The following studies were reviewed today:   EKG:  EKG is ordered today.  The ekg ordered today demonstrates A-fib with RVR 120, bpm, T wave abnormality, no acute ischemic changes.   Cardiac preventice monitor pending  Echo 02/2023:   1. Left ventricular ejection fraction, by estimation, is 40 to 45%. The  left ventricle has mildly decreased function. The left ventricle  demonstrates global hypokinesis. There is  moderate left ventricular  hypertrophy. Left ventricular diastolic function   could not be evaluated.   2. Right ventricular systolic function is normal. The right ventricular  size is normal. There is normal pulmonary artery systolic pressure. The  estimated right ventricular systolic pressure is 28.0 mmHg.   3. Right atrial size was moderately dilated.   4. The mitral valve is normal in structure. Mild mitral valve  regurgitation. No evidence of mitral stenosis.   5. The aortic valve is tricuspid. There is mild calcification of the  aortic valve. Aortic valve regurgitation is not visualized. No aortic  stenosis is present.   6. The inferior vena cava is normal in size with greater than 50%  respiratory variability, suggesting right atrial pressure of 3 mmHg.   Comparison(s): No prior Echocardiogram.  Recent Labs: 03/04/2023: TSH 2.381 03/05/2023: ALT 15 03/06/2023: Magnesium 1.9; Platelets 273 03/27/2023: Hemoglobin 10.6 04/03/2023: BUN 59; Creatinine, Ser 7.89; Potassium 4.9; Sodium 145  Recent Lipid Panel    Component Value Date/Time   CHOL 187 01/16/2023 1201   CHOL 190 02/08/2021 0950   TRIG 83.0 01/16/2023 1201   HDL 49.50 01/16/2023 1201   HDL 41 02/08/2021 0950   CHOLHDL 4 01/16/2023 1201   VLDL 16.6 01/16/2023 1201   LDLCALC 121 (H) 01/16/2023 1201   LDLCALC 128 (H) 02/08/2021 0950    Risk Assessment/Calculations:   CHA2DS2-VASc Score = 4  This indicates a 4.8% annual risk of stroke. The patient's score is based upon: CHF History: 1 HTN History: 1 Diabetes History: 0 Stroke History: 0 Vascular Disease History: 0 Age Score: 1 Gender Score: 1   The 10-year ASCVD risk score (Arnett DK, et al., 2019) is: 15.1%   Values used to calculate the score:     Age: 75 years     Sex: Female     Is Non-Hispanic African American: Yes     Diabetic: No     Tobacco smoker: No     Systolic Blood Pressure: 160 mmHg     Is BP treated: Yes     HDL Cholesterol: 49.5 mg/dL      Total Cholesterol: 187 mg/dL  Home Medications   Current Meds  Medication Sig   acetaminophen (TYLENOL) 325 MG tablet Take 325-650 mg by mouth every 6 (six) hours as needed (pain.).    albuterol (VENTOLIN HFA) 108 (90 Base) MCG/ACT inhaler Inhale 1-2 puffs into the lungs every 6 (six) hours as needed for wheezing or shortness of breath.   atorvastatin (LIPITOR) 10 MG tablet TAKE 1 TABLET(10 MG) BY MOUTH DAILY   calcitRIOL (ROCALTROL) 0.25 MCG capsule Take 0.25 mcg by mouth daily.   fluticasone (FLONASE) 50 MCG/ACT nasal spray Place 2 sprays into both nostrils daily.   metoprolol tartrate (LOPRESSOR) 50 MG tablet Take 1 tablet (50 mg total) by mouth 2 (two) times daily.   sodium bicarbonate 650 MG tablet Take 650 mg by mouth 2 (two) times daily.   warfarin (COUMADIN) 2.5 MG tablet Take 1 tablet daily or as directed by Coumadin  Clinic    Review of Systems    All other systems reviewed and are otherwise negative except as noted above.  Physical Exam    VS:  BP (!) 160/100 (BP Location: Right Arm, Cuff Size: Large)   Pulse (!) 106   Ht 5\' 3"  (1.6 m)   Wt 233 lb 3.2 oz (105.8 kg)   SpO2 98%   BMI 41.31 kg/m  , BMI Body mass index is 41.31 kg/m.  Wt Readings from Last 3 Encounters:  05/07/23 233 lb 3.2 oz (105.8 kg)  04/03/23 204 lb 3.2 oz (92.6 kg)  03/26/23 215 lb (97.5 kg)    Repeat BP: 140/100  GEN: Obese, 67 y.o. female in no acute distress. HEENT: normal. Neck: Supple, no JVD, carotid bruits, or masses. Cardiac: S1/S2, irregular rhythm and fast rate, no murmurs, rubs, or gallops. No clubbing, cyanosis. 3+ nonpitting/dependent edema along BLE, Radials2+ /PT 1+ and equal bilaterally.  Respiratory:  Respirations regular and unlabored, clear to auscultation bilaterally. MS: No deformity or atrophy. Skin: Warm and dry, no rash. Neuro:  Strength and sensation are intact. Psych: Normal affect.  Assessment & Plan    A-fib with RVR Acute on chronic CHF, NICM, leg edema CKD  stage IV Hypertension Current use of long-term anticoagulation  Patient is a 67 year old female with past medical history as mentioned above.  She comes in today for regular follow-up.  Addressed/discussed cardiac clearance and ran past her cardiologist Dr. Jenene Slicker and sent over to requesting party, pt has not heard about her pending left arm AV Gortex graft.  PE as mentioned above.  Has not been taking Lasix.  Weight is up over 15 pounds since I last saw her.  EKG reveals A-fib with RVR.  Ran case past Dr. Jenene Slicker who agreed that patient appears volume overloaded d/t tachycardia and needs IV diuresis/volume optimization prior to considering DCCV.  I agreed. Called and gave report to ED physician [facility] who verbalized understanding of report.   Upon arrival to the ED, I recommend the following be done/obtained:  1.  Twelve-lead EKG and close monitoring vital signs, patient should be placed on telemetry.  Admit for further observation. 2. Labs including: CBC, CMET, magnesium, thyroid panel, and serial troponins.  Consult nephrology. 3.  Patient should receive IV diuresis as she is volume overloaded, weight is up >15 pounds from last time I saw patient in office.  Recommend initiating low-salt diet, 1500 fluid restrictions, daily weights.  Uptitrate GDMT if labs/BP permit. 4.  Update limited echocardiogram to evaluate EF.   5.  Consider DCCV once patient is volume optimized, or achieve heart rate control. Consult cardiology if needed. 6.  Discharge once in stable condition.  Should follow-up with outpatient cardiology 1 to 2 weeks postdischarge.    Signed, Sharlene Dory, NP 05/07/2023, 3:48 PM [facility]

## 2023-05-07 NOTE — Patient Instructions (Addendum)
Medication Instructions:  Your physician recommends that you continue on your current medications as directed. Please refer to the Current Medication list given to you today.  Labwork: none  Testing/Procedures: none  Follow-Up: Your physician recommends that you schedule a follow-up appointment in: sent to [facility]  Any Other Special Instructions Will Be Listed Below (If Applicable).  If you need a refill on your cardiac medications before your next appointment, please call your pharmacy.

## 2023-05-07 NOTE — H&P (Signed)
History and Physical    Patricia Wu ZOX:[id number] DOB: 05/22/1956 DOA: 05/07/2023  PCP: Shade Flood, MD   Patient coming from: Home  I have personally briefly reviewed patient's old medical records in [facility]  Chief Complaint: Palpitations  HPI: Patricia Wu is a 67 y.o. female with medical history significant for atrial fibrillation, CKD5 , hypertension.  Patient was sent to the ED from cardiologist office reports of weight gain.  Per chart, patient has gained about 29 pounds since 4/10.  She reports bilateral lower extremity edema up to her thighs, denies abdominal bloating, denies difficulty breathing.  No chest pain.  But she reports palpitations. Started on Lasix yesterday.  Otherwise she tells me she was not on Lasix.  Reports normal urine output.  She does not have HD access established yet.  ED Course: Temperature 98.2.  Heart rate 60-116.  Respiratory rate 17-20.  Blood pressure systolic 150s.  BNP elevated at 3927.  Troponin 20.  Creatinine elevated at 11.44.  BUN of 99.  1.6. Chest x-ray stable cardiomegaly and pulmonary interstitial prominence. 60 mg IV Lasix given.  Hospitalist to admit for hypervolemia.  Review of Systems: As per HPI all other systems reviewed and negative.  Past Medical History:  Diagnosis Date   CKD (chronic kidney disease)    Hypercholesteremia    Hypertension    Noncompliance with medication regimen     Past Surgical History:  Procedure Laterality Date   COLONOSCOPY WITH PROPOFOL N/A 10/17/2020   Procedure: COLONOSCOPY WITH PROPOFOL;  Surgeon: Lanelle Bal, DO;  Location: [facility];  Service: Endoscopy;  Laterality: N/A;  1:00pm   POLYPECTOMY  10/17/2020   Procedure: POLYPECTOMY;  Surgeon: Lanelle Bal, DO;  Location: [facility];  Service: Endoscopy;;     reports that she has never smoked. She has never been exposed to tobacco smoke. She has never used smokeless tobacco. She reports that she does not  drink alcohol and does not use drugs.  Allergies  Allergen Reactions   Ibuprofen Other (See Comments)    pcp said raises her BP    Family History  Problem Relation Age of Onset   Liver cancer Neg Hx    Colon cancer Neg Hx     Prior to Admission medications   Medication Sig Start Date End Date Taking? Authorizing Provider  acetaminophen (TYLENOL) 325 MG tablet Take 325-650 mg by mouth every 6 (six) hours as needed (pain.).     [provider]  albuterol (VENTOLIN HFA) 108 (90 Base) MCG/ACT inhaler Inhale 1-2 puffs into the lungs every 6 (six) hours as needed for wheezing or shortness of breath. 01/04/23   Shade Flood, MD  atorvastatin (LIPITOR) 10 MG tablet TAKE 1 TABLET(10 MG) BY MOUTH DAILY 01/16/23   Shade Flood, MD  calcitRIOL (ROCALTROL) 0.25 MCG capsule Take 0.25 mcg by mouth daily.    [provider]  fluticasone (FLONASE) 50 MCG/ACT nasal spray Place 2 sprays into both nostrils daily. 04/03/23   Shade Flood, MD  furosemide (LASIX) 40 MG tablet Take 40 mg by mouth daily. Patient not taking: Reported on 05/07/2023 05/06/23   [provider]  metoprolol tartrate (LOPRESSOR) 50 MG tablet Take 1 tablet (50 mg total) by mouth 2 (two) times daily. 03/06/23   Johnson, Clanford L, MD  sodium bicarbonate 650 MG tablet Take 650 mg by mouth 2 (two) times daily.    [provider]  warfarin (COUMADIN) 2.5 MG  tablet Take 1 tablet daily or as directed by [facility] 04/04/23   Mallipeddi, Orion Modest, MD    Physical Exam: Vitals:   05/07/23 1652 05/07/23 2000 05/07/23 2100  BP: (!) 151/122 (!) 153/111 (!) 159/115  Pulse: 79 60 92  Resp: 17 20 18   Temp: 98.2 F (36.8 C)    TempSrc: Oral    SpO2: 97% 98% 98%    Constitutional: NAD, calm, comfortable Vitals:   05/07/23 1652 05/07/23 2000 05/07/23 2100  BP: (!) 151/122 (!) 153/111 (!) 159/115  Pulse: 79 60 92  Resp: 17 20 18   Temp: 98.2 F (36.8 C)    TempSrc: Oral    SpO2: 97% 98%  98%   Eyes: PERRL, lids and conjunctivae normal ENMT: Mucous membranes are moist.   Neck: normal, supple, no masses, no thyromegaly Respiratory: clear to auscultation bilaterally, no wheezing, no crackles. Normal respiratory effort. No accessory muscle use.  Cardiovascular: Regular rate and rhythm, no murmurs / rubs / gallops.  2+ pitting bilateral lower extremity edema.  Extremities warm. Abdomen: no tenderness, no masses palpated. No hepatosplenomegaly. Bowel sounds positive.  No asterixis Musculoskeletal: no clubbing / cyanosis. No joint deformity upper and lower extremities.  Skin: no rashes, lesions, ulcers. No induration Neurologic: Moving extremities spontaneously. Psychiatric: Normal judgment and insight. Alert and oriented x 3. Normal mood.   Labs on Admission: I have personally reviewed following labs and imaging studies  CBC: Recent Labs  Lab 05/07/23 1844  WBC 8.5  HGB 11.9*  HCT 39.6  MCV 95.4  PLT 193   Basic Metabolic Panel: Recent Labs  Lab 05/07/23 1844  NA 141  K 4.6  CL 109  CO2 18*  GLUCOSE 114*  BUN 99*  CREATININE 11.44*  CALCIUM 8.2*   GFR: Estimated Creatinine Clearance: 5.6 mL/min (A) (by C-G formula based on SCr of 11.44 mg/dL (H)). Liver Function Tests: Recent Labs  Lab 05/07/23 1844  AST 28  ALT 50*  ALKPHOS 320*  BILITOT 0.7  PROT 6.6  ALBUMIN 2.8*   Coagulation Profile: Recent Labs  Lab 05/07/23 1844  INR 1.6*    Radiological Exams on Admission: DG Chest Port 1 View  Result Date: 05/07/2023 CLINICAL DATA:  Shortness of breath. EXAM: PORTABLE CHEST 1 VIEW COMPARISON:  03/04/2023 FINDINGS: Stable moderate cardiomegaly and pulmonary interstitial prominence. No evidence of acute infiltrate or pleural effusion. IMPRESSION: Stable cardiomegaly and pulmonary interstitial prominence. No acute findings. Electronically Signed   By: Danae Orleans M.D.   On: 05/07/2023 18:53    EKG: Independently reviewed.   Atria fibrillation, rate 105,  no significant changes from prior.  QTc  434.  Assessment/Plan Principal Problem:   Hypervolemia Active Problems:   Atrial fibrillation with RVR (HCC)   CKD (chronic kidney disease) stage 5, GFR less than 15 ml/min (HCC)  Assessment and Plan: * Hypervolemia With 3+ pitting bilateral lower extremity edema, progressive chronic renal disease.  BNP elevated at 3927.  Recent echo 02/2023 EF of 40 to 45%.  -Continue with IV lasix 60 mg twice daily -Strict input output, daily weights, daily BMP  CKD (chronic kidney disease) stage 5, GFR less than 15 ml/min (HCC) Creatinine up to 11.44, BUN of 99, potassium 4.6, bicarb- 18, with volume overload.  Over the past 2 months, creatinine has remained at about 7, prior to that baseline was about 3.  She does not have HD access established.  She has had preop clearance with cardiology and is awaiting callback from vascular surgery.  No asterixis on exam, she denies nausea or vomiting, reports intermittent metallic taste after meals. -I talked to nephrologist Dr. Glenna Fellows, okay for patient to stay here, nephrology team to see in the morning -Hold Warfarin -Resume oral sodium bicarbonate - NPO midnight  Atrial fibrillation with RVR (HCC) Rate controlled.  On anticoagulation with warfarin.  INR 1.6. - Hold Warfarin for now -Daily PT/INR   DVT prophylaxis: SCDS Code Status: FULL CODE- Confirmed with patient at bedside Family Communication: Spouse at bedside Disposition Plan:  > 2 days Consults called:  Nephrology Admission status: INpt Tele  I certify that at the point of admission it is my clinical judgment that the patient will require inpatient hospital care spanning beyond 2 midnights from the point of admission due to high intensity of service, high risk for further deterioration and high frequency of surveillance required.    Author: Onnie Boer, MD 05/07/2023 11:25 PM  For on call review [email or web address].

## 2023-05-08 ENCOUNTER — Encounter (HOSPITAL_COMMUNITY): Payer: No Typology Code available for payment source

## 2023-05-08 DIAGNOSIS — I5021 Acute systolic (congestive) heart failure: Secondary | ICD-10-CM

## 2023-05-08 DIAGNOSIS — D649 Anemia, unspecified: Secondary | ICD-10-CM | POA: Diagnosis not present

## 2023-05-08 DIAGNOSIS — E8779 Other fluid overload: Secondary | ICD-10-CM

## 2023-05-08 DIAGNOSIS — N185 Chronic kidney disease, stage 5: Secondary | ICD-10-CM | POA: Diagnosis not present

## 2023-05-08 DIAGNOSIS — N179 Acute kidney failure, unspecified: Secondary | ICD-10-CM | POA: Diagnosis not present

## 2023-05-08 DIAGNOSIS — I120 Hypertensive chronic kidney disease with stage 5 chronic kidney disease or end stage renal disease: Secondary | ICD-10-CM | POA: Diagnosis not present

## 2023-05-08 DIAGNOSIS — I4819 Other persistent atrial fibrillation: Secondary | ICD-10-CM | POA: Diagnosis not present

## 2023-05-08 DIAGNOSIS — N2581 Secondary hyperparathyroidism of renal origin: Secondary | ICD-10-CM | POA: Diagnosis not present

## 2023-05-08 LAB — BASIC METABOLIC PANEL
Anion gap: 15 (ref 5–15)
BUN: 99 mg/dL — ABNORMAL HIGH (ref 8–23)
CO2: 15 mmol/L — ABNORMAL LOW (ref 22–32)
Calcium: 8.4 mg/dL — ABNORMAL LOW (ref 8.9–10.3)
Chloride: 111 mmol/L (ref 98–111)
Creatinine, Ser: 11.67 mg/dL — ABNORMAL HIGH (ref 0.44–1.00)
GFR, Estimated: 3 mL/min — ABNORMAL LOW (ref >60)
Glucose, Bld: 77 mg/dL (ref 70–99)
Potassium: 4.6 mmol/L (ref 3.5–5.1)
Sodium: 141 mmol/L (ref 135–145)

## 2023-05-08 LAB — CBC
HCT: 35.9 % — ABNORMAL LOW (ref 36.0–46.0)
Hemoglobin: 11 g/dL — ABNORMAL LOW (ref 12.0–15.0)
MCH: 28.6 pg (ref 26.0–34.0)
MCHC: 30.6 g/dL (ref 30.0–36.0)
MCV: 93.5 fL (ref 80.0–100.0)
Platelets: 193 10*3/uL (ref 150–400)
RBC: 3.84 MIL/uL — ABNORMAL LOW (ref 3.87–5.11)
RDW: 16.4 % — ABNORMAL HIGH (ref 11.5–15.5)
WBC: 8.4 10*3/uL (ref 4.0–10.5)
nRBC: 0 % (ref 0.0–0.2)

## 2023-05-08 MED ORDER — FUROSEMIDE 10 MG/ML IJ SOLN: 80.0000 mg | Freq: Two times a day (BID) | INTRAMUSCULAR | Status: DC

## 2023-05-08 MED ORDER — FUROSEMIDE 10 MG/ML IJ SOLN
80.0000 mg | Freq: Two times a day (BID) | INTRAMUSCULAR | Status: DC
  Administered 2023-05-08 (×2): 80 mg via INTRAVENOUS
  Filled 2023-05-08 (×2): qty 8

## 2023-05-08 MED ORDER — METOPROLOL TARTRATE 50 MG PO TABS
50.0000 mg | ORAL_TABLET | Freq: Two times a day (BID) | ORAL | Status: DC
  Administered 2023-05-08 – 2023-05-10 (×5): 50 mg via ORAL
  Filled 2023-05-08 (×5): qty 1

## 2023-05-08 MED ORDER — CHLORHEXIDINE GLUCONATE CLOTH 2 % EX PADS
6.0000 | MEDICATED_PAD | Freq: Every day | CUTANEOUS | Status: DC
  Administered 2023-05-09 – 2023-05-13 (×6): 6 via TOPICAL

## 2023-05-08 NOTE — TOC Initial Note (Addendum)
Transition of Care [facility] - Initial/Assessment Note    Patient Details  Name: Patricia Wu MRN: [id number] Date of Birth: [birth year]  Transition of Care [facility]) CM/SW Contact:    Karn Cassis, LCSW Phone Number: 05/08/2023, 1:10 PM  Clinical Narrative:  Pt admitted due to hypervolemia. Per MD, pt will start dialysis tomorrow and will need outpatient dialysis arranged. Pt states she lives with her husband and daughter. She is independent with ADLs. Family provides transportation to appointments. Discussed new dialysis with pt. She has been followed by Dr. Arrie Aran and agrees to [facility] in [place]. Will start CLIP. Pt states she prefers MWF 2nd shift if possible and said her family will transport. TOC will continue to follow.                  Expected Discharge Plan: Home/Self Care Barriers to Discharge: Continued Medical Work up   Patient Goals and CMS Choice Patient states their goals for this hospitalization and ongoing recovery are:: return home   Choice offered to / list presented to : Patient [place] ownership interest in [facility].provided to::  (n/a)    Expected Discharge Plan and Services In-house Referral: Clinical Social Work     Living arrangements for the past 2 months: Single Family Home                                      Prior Living Arrangements/Services Living arrangements for the past 2 months: Single Family Home Lives with:: Spouse, Adult Children Patient language and need for interpreter reviewed:: Yes        Need for Family Participation in Patient Care: No (Comment)     Criminal Activity/Legal Involvement Pertinent to Current Situation/Hospitalization: No - Comment as needed  Activities of Daily Living Home Assistive Devices/Equipment: None ADL Screening (condition at time of admission) Patient's cognitive ability adequate to safely complete daily activities?: No Is the patient deaf or have  difficulty hearing?: No Does the patient have difficulty seeing, even when wearing glasses/contacts?: No Does the patient have difficulty concentrating, remembering, or making decisions?: No Patient able to express need for assistance with ADLs?: No Does the patient have difficulty dressing or bathing?: No Independently performs ADLs?: Yes (appropriate for developmental age) Does the patient have difficulty walking or climbing stairs?: No Weakness of Legs: None Weakness of Arms/Hands: None  Permission Sought/Granted                  Emotional Assessment     Affect (typically observed): Appropriate Orientation: : Oriented to Self, Oriented to Place, Oriented to  Time, Oriented to Situation Alcohol / Substance Use: Not Applicable Psych Involvement: No (comment)  Admission diagnosis:  Hypervolemia [E87.70] Uncontrolled hypertension [I10] Atrial fibrillation, controlled (HCC) [I48.91] Acute on chronic combined systolic and diastolic CHF (congestive heart failure) (HCC) [I50.43] Hypervolemia, unspecified hypervolemia type [E87.70] Stage 5 chronic kidney disease not on chronic dialysis [facility]) [N18.5] Patient Active Problem List   Diagnosis Date Noted   Hypervolemia 05/07/2023   CKD (chronic kidney disease) stage 5, GFR less than 15 ml/min (HCC) 05/07/2023   History of Coumadin therapy 03/26/2023   Atrial fibrillation with RVR (HCC) 03/04/2023   Asthma, chronic 03/04/2023   Chronic kidney disease, stage IV (severe) (HCC) 02/18/2023   Anemia of chronic renal failure 02/18/2023   Acute-on-chronic kidney injury (HCC) 02/15/2022   Hyperlipidemia 02/15/2022   Obesity (BMI  30-39.9) 02/15/2022   Colon cancer screening 09/21/2020   Elevated alkaline phosphatase level 09/21/2020   Essential hypertension 02/18/2018   Elevated brain natriuretic peptide (BNP) level 02/18/2018   CKD (chronic kidney disease) stage 3, GFR 30-59 ml/min (HCC) 02/18/2018   PCP:  Shade Flood,  MD Pharmacy:   [facility] [phone] - [place], [place] - [address] [place] [address] Phone: [phone] Fax: [phone]     Social Determinants of Health (SDOH) Social History: SDOH Screenings   Food Insecurity: No Food Insecurity (05/07/2023)  Housing: Low Risk  (05/07/2023)  Transportation Needs: No Transportation Needs (05/07/2023)  Utilities: Not At Risk (05/07/2023)  Alcohol Screen: Low Risk  (11/28/2022)  Depression (PHQ2-9): Medium Risk (04/03/2023)  Financial Resource Strain: Low Risk  (11/28/2022)  Physical Activity: Insufficiently Active (11/28/2022)  Social Connections: Socially Integrated (11/28/2022)  Stress: No Stress Concern Present (11/28/2022)  Tobacco Use: Low Risk  (05/07/2023)   SDOH Interventions:     Readmission Risk Interventions     No data to display

## 2023-05-08 NOTE — Progress Notes (Signed)
Mobility Specialist Progress Note:    05/08/23 1115  Mobility  Activity Ambulated with assistance in hallway  Level of Assistance Contact guard assist, steadying assist  Assistive Device Front wheel walker  Distance Ambulated (ft) 350 ft  Range of Motion/Exercises Active;Right arm;Left arm;Right leg;Left leg  Activity Response Tolerated well  Mobility Referral Yes  $Mobility charge 1 Mobility  Mobility Specialist Start Time (ACUTE ONLY) 1115  Mobility Specialist Stop Time (ACUTE ONLY) 1135  Mobility Specialist Time Calculation (min) (ACUTE ONLY) 20 min   Pt agreeable to mobility session, received in bed, family in room. Tolerated session well, asx throughout. Took several standing rest breaks throughout d/t fatigue. SpO2 96% on RA throughout ambulation. Returned pt to room, sitting EOB, all needs met.   Feliciana Rossetti Mobility Specialist Please contact via Special educational needs teacher or  Rehab office at [phone]

## 2023-05-08 NOTE — Consult Note (Addendum)
Nephrology Consult   Assessment/Recommendations:  AKI on CKD5, now ESRD -followed by Dr. Arrie Aran. AKI likely related to CRS -at this junction, would favor starting HD for her especially given early uremic symptoms. Discussed this at length with patient and she is agreeable, discussed process as well. Will need a TDC, surgery unable to do it at this time. Consult placed for IR for [facility] placement. Plan for AVG once heart rate is more stable (inpatient versus outpatient) -plan for HD start 5/16. Slow start protocol. Will need SW/CM assistance for outpatient placement -Avoid nephrotoxic medications including NSAIDs and iodinated intravenous contrast exposure unless the latter is absolutely indicated.  Preferred narcotic agents for pain control are hydromorphone, fentanyl, and methadone. Morphine should not be used. Avoid Baclofen and avoid oral sodium phosphate and magnesium citrate based laxatives / bowel preps. Continue strict Input and Output monitoring. Will monitor the patient closely with you and intervene or adjust therapy as indicated by changes in clinical status/labs   Hypervolemia -currently on lasix 60mg  IV BID, will increase to 80mg  IV BID -consider repeat echo -follow strict I/O, daily weights  Afib w/ RVR -per primary, A/C on hold for now. Not on any rate controlling meds at this time. Recommend checking mag, TFTs, serial troponins at the recommendation of outpatient cardiology  HTN -diuresis as above  Anemia of CKD -tranfuse prn for hgb <7. Hgb currently acceptable for CKD. Of note, receives retacrit 20,000 units outpatient (last dose 4/3)  Secondary hyperparathyroidism/CKD-MBD -will check PO4 and PTH  Metabolic acidosis -currently on nahco3, monitor for now. Can d/c this once stable on HD  Recommendations conveyed to primary service.   Anthony Sar [facility] 05/08/2023 7:33  AM  _____________________________________________________________________________________   History of Present Illness: Patricia Wu is a/an 67 y.o. female with a past medical history of CKD 5, A-fib, hypertension who presents to [facility] with weight gain/volume overload.  She was recently seen in our office by Dr. Arrie Aran on 5/13 and had around 27 pound weight gain at that time, started on Lasix 40 mg daily (of note, was on Lasix prior to that). She was sent into the hospital by her cardiologist on 5/15. She has been pending an AVG placement which has been on hold due to her recent admission for A-fib with RVR.  Her BNP was elevated at 3927.  Creatinine was elevated to 11.4.  Her last known creatinine was 7.89 on 4/10.  Echo in March revealed an EF of 40 to 45%. She was started on lasix 60mg  IV BID. We are consulted for hypervolemia, CKD 5. Patient seen and examined bedside. She reports that she has been having a metallic taste in her mouth, more noticeable after meals. She denies fevers, chest pain, SOB, diminished urinary frequency, n/v, loss of appetite, new shakes/tremors, brain fog, intractable hiccups/pruritus.   Medications:  Current Facility-Administered Medications  Medication Dose Route Frequency Provider Last Rate Last Admin   acetaminophen (TYLENOL) tablet 650 mg  650 mg Oral Q6H PRN Emokpae, Ejiroghene E, MD       Or   acetaminophen (TYLENOL) suppository 650 mg  650 mg Rectal Q6H PRN Emokpae, Ejiroghene E, MD       calcitRIOL (ROCALTROL) capsule 0.5 mcg  0.5 mcg Oral Daily Emokpae, Ejiroghene E, MD       furosemide (LASIX) injection 60 mg  60 mg Intravenous Q12H Emokpae, Ejiroghene E, MD       ondansetron (ZOFRAN) tablet 4 mg  4 mg Oral Q6H PRN Emokpae,  Ejiroghene E, MD       Or   ondansetron (ZOFRAN) injection 4 mg  4 mg Intravenous Q6H PRN Emokpae, Ejiroghene E, MD       polyethylene glycol (MIRALAX / GLYCOLAX) packet 17 g  17 g Oral Daily PRN Emokpae, Ejiroghene E, MD        sodium bicarbonate tablet 650 mg  650 mg Oral BID Emokpae, Ejiroghene E, MD   650 mg at 05/08/23 0020     ALLERGIES Ibuprofen  MEDICAL HISTORY Past Medical History:  Diagnosis Date   CKD (chronic kidney disease)    Hypercholesteremia    Hypertension    Noncompliance with medication regimen      SOCIAL HISTORY Social History   Socioeconomic History   Marital status: Married    Spouse name: Not on file   Number of children: 2   Years of education: Not on file   Highest education level: Not on file  Occupational History   Not on file  Tobacco Use   Smoking status: Never    Passive exposure: Never   Smokeless tobacco: Never  Vaping Use   Vaping Use: Never used  Substance and Sexual Activity   Alcohol use: No   Drug use: No   Sexual activity: Yes  Other Topics Concern   Not on file  Social History Narrative   Not on file   Social Determinants of Health   Financial Resource Strain: Low Risk  (11/28/2022)   Overall Financial Resource Strain (CARDIA)    Difficulty of Paying Living Expenses: Not hard at all  Food Insecurity: No Food Insecurity (05/07/2023)   Hunger Vital Sign    Worried About Running Out of Food in the Last Year: Never true    Ran Out of Food in the Last Year: Never true  Transportation Needs: No Transportation Needs (05/07/2023)   PRAPARE - Administrator, Civil Service (Medical): No    Lack of Transportation (Non-Medical): No  Physical Activity: Insufficiently Active (11/28/2022)   Exercise Vital Sign    Days of Exercise per Week: 3 days    Minutes of Exercise per Session: 30 min  Stress: No Stress Concern Present (11/28/2022)   [employer] of Occupational Health - Occupational Stress Questionnaire    Feeling of Stress : Not at all  Social Connections: Socially Integrated (11/28/2022)   Social Connection and Isolation Panel [NHANES]    Frequency of Communication with Friends and Family: Three times a week    Frequency of Social  Gatherings with Friends and Family: Once a week    Attends Religious Services: More than 4 times per year    Active Member of [employer] or Organizations: Yes    Attends Engineer, structural: More than 4 times per year    Marital Status: Married  Catering manager Violence: Not At Risk (05/07/2023)   Humiliation, Afraid, Rape, and Kick questionnaire    Fear of Current or Ex-Partner: No    Emotionally Abused: No    Physically Abused: No    Sexually Abused: No     FAMILY HISTORY Family History  Problem Relation Age of Onset   Liver cancer Neg Hx    Colon cancer Neg Hx     Review of Systems: 12 systems reviewed Otherwise as per HPI, all other systems reviewed and negative  Physical Exam: Vitals:   05/08/23 0211 05/08/23 0700  BP: 129/77 (!) 166/116  Pulse: 99 (!) 101  Resp: 19 18  Temp: 97.8  F (36.6 C) 98.4 F (36.9 C)  SpO2: 98% 99%   No intake/output data recorded.  Intake/Output Summary (Last 24 hours) at 05/08/2023 9604 Last data filed at 05/07/2023 2034 Gross per 24 hour  Intake 120 ml  Output --  Net 120 ml   General: well-appearing, no acute distress HEENT: anicteric sclera, oropharynx clear without lesions CV: irreg irreg Lungs: clear to auscultation bilaterally, normal work of breathing Abd: soft, non-tender, non-distended Skin: no visible lesions or rashes Psych: alert, engaged, appropriate mood and affect Musculoskeletal: 3+ pitting edema b/l LEs Neuro: normal speech, no gross focal deficits, no asterixis  Test Results Reviewed Lab Results  Component Value Date   NA 141 05/08/2023   K 4.6 05/08/2023   CL 111 05/08/2023   CO2 15 (L) 05/08/2023   BUN 99 (H) 05/08/2023   CREATININE 11.67 (H) 05/08/2023   GFR 4.92 (LL) 04/03/2023   CALCIUM 8.4 (L) 05/08/2023   ALBUMIN 2.8 (L) 05/07/2023   PHOS 6.9 (H) 03/27/2023     I have reviewed all relevant outside healthcare records related to the patient's kidney injury.

## 2023-05-08 NOTE — Consult Note (Signed)
Chief Complaint: Patient was seen in consultation today for acute kidney injury.  Referring Physician(s): Anthony Sar, MD  Supervising Physician: Roanna Banning  Patient Status: [facility] - inpt  History of Present Illness: Patricia Wu is a 67 y.o. female with a past medical history significant for HTN, HLD, a.fib w/ RVR on coumadin, CHF, CKD V who presented to the ED yesterday after being seen by her cardiologist where she reported new weight gain (~30 lbs in a month) and BLE edema. She had been seen previously by nephrology on 05/06/23 where she also reported significant weight gain and was started on Lasix 40 mg QD. She has been pending AVG placement for impending HD however this was unable to be performed due to recent admission for a.fib w/RVR . She is requiring initiation of HD during this admission and IR has been consulted for tunneled HD catheter placement.  Patient sleeping upon entry to room, arouses easily. She reports continued swelling and having to urinate a lot since being on Lasix, she otherwise feels well. Tolerating diet. Reviewed indication and plan for tunneled HD catheter placement which she is agreeable to.  Past Medical History:  Diagnosis Date   CKD (chronic kidney disease)    Hypercholesteremia    Hypertension    Noncompliance with medication regimen     Past Surgical History:  Procedure Laterality Date   COLONOSCOPY WITH PROPOFOL N/A 10/17/2020   Procedure: COLONOSCOPY WITH PROPOFOL;  Surgeon: Lanelle Bal, DO;  [facility]: [facility];  Service: Endoscopy;  Laterality: N/A;  1:00pm   POLYPECTOMY  10/17/2020   Procedure: POLYPECTOMY;  Surgeon: Lanelle Bal, DO;  Location: [facility];  Service: Endoscopy;;    Allergies: Ibuprofen  Medications: Prior to Admission medications   Medication Sig Start Date End Date Taking? Authorizing Provider  acetaminophen (TYLENOL) 325 MG tablet Take 325-650 mg by mouth every 6 (six) hours as needed (pain.).     Yes [provider]  albuterol (VENTOLIN HFA) 108 (90 Base) MCG/ACT inhaler Inhale 1-2 puffs into the lungs every 6 (six) hours as needed for wheezing or shortness of breath. 01/04/23  Yes Shade Flood, MD  atorvastatin (LIPITOR) 10 MG tablet TAKE 1 TABLET(10 MG) BY MOUTH DAILY Patient taking differently: Take 10 mg by mouth daily. 01/16/23  Yes Shade Flood, MD  calcitRIOL (ROCALTROL) 0.25 MCG capsule Take 0.5 mcg by mouth daily.   Yes [provider]  furosemide (LASIX) 40 MG tablet Take 40 mg by mouth daily. 05/06/23  Yes [provider]  metoprolol tartrate (LOPRESSOR) 50 MG tablet Take 1 tablet (50 mg total) by mouth 2 (two) times daily. 03/06/23  Yes Johnson, Clanford L, MD  sodium bicarbonate 650 MG tablet Take 650 mg by mouth 2 (two) times daily.   Yes [provider]  warfarin (COUMADIN) 2.5 MG tablet Take 1 tablet daily or as directed by [facility] 04/04/23  Yes Mallipeddi, Vishnu P, MD  fluticasone (FLONASE) 50 MCG/ACT nasal spray Place 2 sprays into both nostrils daily. Patient not taking: Reported on 05/08/2023 04/03/23   Shade Flood, MD     Family History  Problem Relation Age of Onset   Liver cancer Neg Hx    Colon cancer Neg Hx     Social History   Socioeconomic History   Marital status: Married    Spouse name: Not on file   Number of children: 2   Years of education: Not on file   Highest education  level: Not on file  Occupational History   Not on file  Tobacco Use   Smoking status: Never    Passive exposure: Never   Smokeless tobacco: Never  Vaping Use   Vaping Use: Never used  Substance and Sexual Activity   Alcohol use: No   Drug use: No   Sexual activity: Yes  Other Topics Concern   Not on file  Social History Narrative   Not on file   Social Determinants of Health   Financial Resource Strain: Low Risk  (11/28/2022)   Overall Financial Resource Strain (CARDIA)    Difficulty of Paying Living  Expenses: Not hard at all  Food Insecurity: No Food Insecurity (05/07/2023)   Hunger Vital Sign    Worried About Running Out of Food in the Last Year: Never true    Ran Out of Food in the Last Year: Never true  Transportation Needs: No Transportation Needs (05/07/2023)   PRAPARE - Administrator, Civil Service (Medical): No    Lack of Transportation (Non-Medical): No  Physical Activity: Insufficiently Active (11/28/2022)   Exercise Vital Sign    Days of Exercise per Week: 3 days    Minutes of Exercise per Session: 30 min  Stress: No Stress Concern Present (11/28/2022)   [employer] of Occupational Health - Occupational Stress Questionnaire    Feeling of Stress : Not at all  Social Connections: Socially Integrated (11/28/2022)   Social Connection and Isolation Panel [NHANES]    Frequency of Communication with Friends and Family: Three times a week    Frequency of Social Gatherings with Friends and Family: Once a week    Attends Religious Services: More than 4 times per year    Active Member of [employer] or Organizations: Yes    Attends Engineer, structural: More than 4 times per year    Marital Status: Married     Review of Systems: A 12 point ROS discussed and pertinent positives are indicated in the HPI above.  All other systems are negative.  Review of Systems  Constitutional:  Positive for fatigue and unexpected weight change. Negative for appetite change, chills and fever.  Respiratory:  Negative for cough and shortness of breath.   Cardiovascular:  Positive for leg swelling. Negative for chest pain.  Gastrointestinal:  Negative for abdominal pain, diarrhea, nausea and vomiting.  Musculoskeletal:  Negative for back pain.  Neurological:  Negative for dizziness and headaches.    Vital Signs: BP (!) 166/116 (BP Location: Left Arm)   Pulse (!) 101   Temp 98.4 F (36.9 C) (Oral)   Resp 18   Wt 233 lb 0.4 oz (105.7 kg)   SpO2 99%   BMI 41.28 kg/m    Physical Exam Vitals and nursing note reviewed.  Constitutional:      General: She is not in acute distress. HENT:     Head: Normocephalic.     Mouth/Throat:     Mouth: Mucous membranes are moist.     Pharynx: Oropharynx is clear. No oropharyngeal exudate or posterior oropharyngeal erythema.  Cardiovascular:     Rate and Rhythm: Normal rate. Rhythm irregular.  Pulmonary:     Effort: Pulmonary effort is normal.     Breath sounds: Normal breath sounds.  Abdominal:     General: There is no distension.     Palpations: Abdomen is soft.     Tenderness: There is no abdominal tenderness.  Musculoskeletal:     Right lower leg: Edema present.  Left lower leg: Edema present.  Skin:    General: Skin is warm and dry.  Neurological:     Mental Status: She is alert and oriented to person, place, and time.  Psychiatric:        Mood and Affect: Mood normal.        Behavior: Behavior normal.        Thought Content: Thought content normal.        Judgment: Judgment normal.      MD Evaluation Airway: WNL Heart: WNL Abdomen: WNL Chest/ Lungs: WNL ASA  Classification: 3 Mallampati/Airway Score: Two   Imaging: DG Chest Port 1 View  Result Date: 05/07/2023 CLINICAL DATA:  Shortness of breath. EXAM: PORTABLE CHEST 1 VIEW COMPARISON:  03/04/2023 FINDINGS: Stable moderate cardiomegaly and pulmonary interstitial prominence. No evidence of acute infiltrate or pleural effusion. IMPRESSION: Stable cardiomegaly and pulmonary interstitial prominence. No acute findings. Electronically Signed   By: Danae Orleans M.D.   On: 05/07/2023 18:53    Labs:  CBC: Recent Labs    03/05/23 0408 03/06/23 0406 03/27/23 0842 05/07/23 1844 05/08/23 0446  WBC 7.3 7.3  --  8.5 8.4  HGB 8.3* 8.1* 10.6* 11.9* 11.0*  HCT 27.0* 26.6*  --  39.6 35.9*  PLT 266 273  --  193 193    COAGS: Recent Labs    04/09/23 1510 04/15/23 1355 04/23/23 1412 05/07/23 1844  INR 1.3* 2.6 2.0 1.6*    BMP: Recent  Labs    03/06/23 0406 03/27/23 0833 04/03/23 1225 05/07/23 1844 05/08/23 0446  NA 143 141 145 141 141  K 3.9 4.5 4.9 4.6 4.6  CL 113* 113* 111 109 111  CO2 19* 18* 22 18* 15*  GLUCOSE 103* 99 96 114* 77  BUN 50* 60* 59* 99* 99*  CALCIUM 8.3* 8.8* 8.8 8.2* 8.4*  CREATININE 7.25* 7.72* 7.89* 11.44* 11.67*  GFRNONAA 6* 5*  --  3* 3*    LIVER FUNCTION TESTS: Recent Labs    07/11/22 1138 01/16/23 1201 03/05/23 0408 03/06/23 0406 03/27/23 0833 05/07/23 1844  BILITOT 0.5 0.5 0.5  --   --  0.7  AST 13 12 11*  --   --  28  ALT 9 13 15   --   --  50*  ALKPHOS 302* 229* 185*  --   --  320*  PROT 8.1 7.7 5.8*  --   --  6.6  ALBUMIN 4.3 4.0 2.6* 2.5* 3.1* 2.8*    TUMOR MARKERS: No results for input(s): "AFPTM", "CEA", "CA199", "CHROMGRNA" in the last 8760 hours.  Assessment and Plan:  67 y/o F with history of CKD V admitted with volume overload, acute CHF exacerbation and AKI on CKD. Patient followed [facility] with previous plans for AVG placement in anticipation of HD, however this has been on hold due to recent admission for a.fib with RVR.   Patient seen by nephrology today who recommends initiation of HD. IR has been consulted for tunneled HD catheter placement.  Plan: - NPO midnight 5/16 due to use of moderate sedation, may have sips with meds (please give all AM meds on day of procedure) - Patient to be transported to [facility] via [facility] tomorrow for 8 am procedure at [facility] with return to [facility] post procedure for HD. Appreciate RN for coordinating this. **Please send consent form signed today with patient** - No pre procedure labs indicated - No current anticoagulation  Risks and benefits discussed with the patient including, but not  limited to bleeding, infection, vascular injury, pneumothorax which may require chest tube placement, air embolism or even death.  All of the patient's questions were answered, patient is agreeable to proceed.  Consent signed and  in chart.  Thank you for this interesting consult.  I greatly enjoyed meeting Patricia Wu and look forward to participating in their care.  A copy of this report was sent to the requesting provider on this date.  Electronically Signed: Villa Herb, PA-C 05/08/2023, 11:10 AM   I spent a total of 40 Minutes in face to face in clinical consultation, greater than 50% of which was counseling/coordinating care for AKI on CKD.

## 2023-05-08 NOTE — Progress Notes (Signed)
PROGRESS NOTE    Patricia Wu  ZOX:[id number] DOB: November 01, 1956 DOA: 05/07/2023 PCP: Shade Flood, MD   Brief Narrative:    Patricia Wu is a 67 y.o. female with medical history significant for atrial fibrillation, CKD5 , hypertension.  Patient was sent to the ED from cardiologist office reports of weight gain.  Per chart, patient has gained about 29 pounds since 4/10.  Patient was admitted with AKI on CKD stage V, now ESRD with associated hypervolemia as well as atrial fibrillation with RVR.  Assessment & Plan:   Principal Problem:   Hypervolemia Active Problems:   Atrial fibrillation with RVR (HCC)   CKD (chronic kidney disease) stage 5, GFR less than 15 ml/min (HCC)  Assessment and Plan:  AKI on CKD stage V with hypervolemia With 3+ pitting bilateral lower extremity edema, progressive chronic renal disease.  BNP elevated at 3927.  Recent echo 02/2023 EF of 40 to 45%.  -Continue with IV lasix 60 mg twice daily changed to 80 mg twice daily -Strict input output, daily weights, daily BMP -Plan for [facility] placement for hemodialysis per nephrology.  Plan to have this placed by 5/16 with n.p.o. after midnight and resume renal diet -Hold Warfarin -Resume oral sodium bicarbonate   Atrial fibrillation with RVR (HCC) Rate controlled.  On anticoagulation with warfarin.  INR 1.6. - Hold Warfarin for now -Daily PT/INR -Appreciate cardiology evaluation  Hypertension -Continue diuresis  Anemia of CKD -Transfuse for hemoglobin less than 7 monitor CBC  Morbid obesity -BMI 41.28   DVT prophylaxis: Heparin Code Status: Full Family Communication: None at bedside Disposition Plan:  Status is: Inpatient Remains inpatient appropriate because: Need for IV medications.   Consultants:  Nephrology Cardiology  Procedures:  None  Antimicrobials:  None   Subjective: Patient seen and evaluated today with no new acute complaints or concerns. No acute concerns or events noted  overnight.  She is agreeable to dialysis catheter placement tomorrow.  Objective: Vitals:   05/07/23 2337 05/08/23 0211 05/08/23 0500 05/08/23 0700  BP: (!) 150/115 129/77  (!) 166/116  Pulse: (!) 127 99  (!) 101  Resp: 19 19  18   Temp: 97.9 F (36.6 C) 97.8 F (36.6 C)  98.4 F (36.9 C)  TempSrc: Oral Oral  Oral  SpO2: 100% 98%  99%  Weight:   105.7 kg     Intake/Output Summary (Last 24 hours) at 05/08/2023 1104 Last data filed at 05/07/2023 2034 Gross per 24 hour  Intake 120 ml  Output --  Net 120 ml   Filed Weights   05/08/23 0500  Weight: 105.7 kg    Examination:  General exam: Appears calm and comfortable, morbidly obese Respiratory system: Clear to auscultation. Respiratory effort normal. Cardiovascular system: S1 & S2 heard, RRR.  Gastrointestinal system: Abdomen is soft Central nervous system: Alert and awake Extremities: Bilateral pitting edema Skin: No significant lesions noted Psychiatry: Flat affect.    Data Reviewed: I have personally reviewed following labs and imaging studies  CBC: Recent Labs  Lab 05/07/23 1844 05/08/23 0446  WBC 8.5 8.4  HGB 11.9* 11.0*  HCT 39.6 35.9*  MCV 95.4 93.5  PLT 193 193   Basic Metabolic Panel: Recent Labs  Lab 05/07/23 1844 05/08/23 0446  NA 141 141  K 4.6 4.6  CL 109 111  CO2 18* 15*  GLUCOSE 114* 77  BUN 99* 99*  CREATININE 11.44* 11.67*  CALCIUM 8.2* 8.4*   GFR: Estimated Creatinine Clearance: 5.5 mL/min (A) (by C-G  formula based on SCr of 11.67 mg/dL (H)). Liver Function Tests: Recent Labs  Lab 05/07/23 1844  AST 28  ALT 50*  ALKPHOS 320*  BILITOT 0.7  PROT 6.6  ALBUMIN 2.8*   No results for input(s): "LIPASE", "AMYLASE" in the last 168 hours. No results for input(s): "AMMONIA" in the last 168 hours. Coagulation Profile: Recent Labs  Lab 05/07/23 1844  INR 1.6*   Cardiac Enzymes: No results for input(s): "CKTOTAL", "CKMB", "CKMBINDEX", "TROPONINI" in the last 168 hours. BNP (last  3 results) No results for input(s): "PROBNP" in the last 8760 hours. HbA1C: No results for input(s): "HGBA1C" in the last 72 hours. CBG: No results for input(s): "GLUCAP" in the last 168 hours. Lipid Profile: No results for input(s): "CHOL", "HDL", "LDLCALC", "TRIG", "CHOLHDL", "LDLDIRECT" in the last 72 hours. Thyroid Function Tests: No results for input(s): "TSH", "T4TOTAL", "FREET4", "T3FREE", "THYROIDAB" in the last 72 hours. Anemia Panel: No results for input(s): "VITAMINB12", "FOLATE", "FERRITIN", "TIBC", "IRON", "RETICCTPCT" in the last 72 hours. Sepsis Labs: No results for input(s): "PROCALCITON", "LATICACIDVEN" in the last 168 hours.  No results found for this or any previous visit (from the past 240 hour(s)).       Radiology Studies: DG Chest Port 1 View  Result Date: 05/07/2023 CLINICAL DATA:  Shortness of breath. EXAM: PORTABLE CHEST 1 VIEW COMPARISON:  03/04/2023 FINDINGS: Stable moderate cardiomegaly and pulmonary interstitial prominence. No evidence of acute infiltrate or pleural effusion. IMPRESSION: Stable cardiomegaly and pulmonary interstitial prominence. No acute findings. Electronically Signed   By: Danae Orleans M.D.   On: 05/07/2023 18:53        Scheduled Meds:  calcitRIOL  0.5 mcg Oral Daily   Chlorhexidine Gluconate Cloth  6 each Topical Q0600   furosemide  80 mg Intravenous Q12H   metoprolol tartrate  50 mg Oral BID   sodium bicarbonate  650 mg Oral BID     LOS: 1 day    Time spent: 35 minutes    Masayoshi Couzens Hoover Brunette, DO [facility]  If 7PM-7AM, please contact night-coverage [email or web address] 05/08/2023, 11:04 AM

## 2023-05-08 NOTE — Progress Notes (Signed)
Called to verify transportation with [facility].  Scheduler stated that patient is on schedule and pick up time from [place] is for 0730 am on 05-09-2023.

## 2023-05-08 NOTE — Consult Note (Signed)
Cardiology Consultation   Patient ID: Patricia Wu MRN: [id number]; DOB: 02/27/1956  Admit date: 05/07/2023 Date of Consult: 05/08/2023  PCP:  Shade Flood, MD   [facility] Providers Cardiologist:  Marjo Bicker, MD  Cardiology APP:  Sharlene Dory, NP      Patient Profile:   Patricia Wu is a 67 y.o. female with a hx of HFmrEF (EF 40-45% by echo in 02/2023), persistent atrial fibrillation (diagnosed in 02/2023), HTN, HLD, asthma and Stag 5 CKD who is being seen 05/08/2023 for the evaluation of atrial fibrillation with RVR at the request of Dr. Sherryll Burger.  History of Present Illness:   Patricia Wu was evaluated by Sharlene Dory, NP on 05/07/2023 and reported fatigue and a 15 pound weight gain since her prior office visit. She denied any recent chest pain or palpitations but was also found to be in atrial fibrillation with RVR with heart rate in the 120's. Given her volume overload on examination and atrial fibrillation with RVR, it was recommended that she proceed to the ED for likely admission. Labs at the time of Emergency Department arrival showed WBC 8.5, Hgb 11.9 (close to baseline), platelets 193, Na+ 141, K+ 4.6 and creatinine 11.44 (previously at 7.89 on 04/03/2023). AST 28 and ALT 50. Alk phos elevated to 320. BNP elevated to 3927. Hs troponin at 20. CXR showed stable cardiomegaly. EKG shows atrial fibrillation with RVR, HR 105.  Patient reported having bilateral lower EXTR swelling in the last 4 weeks but no DOE. She is also not much active at home. Her daughter and family helps her with activities.No angina.  Nephrology is on board, who recommended IR guided dialysis catheter insertion and initiation of dialysis tomorrow.   Past Medical History:  Diagnosis Date   CKD (chronic kidney disease)    Hypercholesteremia    Hypertension    Noncompliance with medication regimen     Past Surgical History:  Procedure Laterality Date   COLONOSCOPY WITH  PROPOFOL N/A 10/17/2020   Procedure: COLONOSCOPY WITH PROPOFOL;  Surgeon: Lanelle Bal, DO;  [facility];  Service: Endoscopy;  Laterality: N/A;  1:00pm   POLYPECTOMY  10/17/2020   Procedure: POLYPECTOMY;  Surgeon: Lanelle Bal, DO;  Location: [facility];  Service: Endoscopy;;     Home Medications:  Prior to Admission medications   Medication Sig Start Date End Date Taking? Authorizing Provider  acetaminophen (TYLENOL) 325 MG tablet Take 325-650 mg by mouth every 6 (six) hours as needed (pain.).    Yes [provider]  albuterol (VENTOLIN HFA) 108 (90 Base) MCG/ACT inhaler Inhale 1-2 puffs into the lungs every 6 (six) hours as needed for wheezing or shortness of breath. 01/04/23  Yes Shade Flood, MD  atorvastatin (LIPITOR) 10 MG tablet TAKE 1 TABLET(10 MG) BY MOUTH DAILY Patient taking differently: Take 10 mg by mouth daily. 01/16/23  Yes Shade Flood, MD  calcitRIOL (ROCALTROL) 0.25 MCG capsule Take 0.5 mcg by mouth daily.   Yes [provider]  furosemide (LASIX) 40 MG tablet Take 40 mg by mouth daily. 05/06/23  Yes [provider]  metoprolol tartrate (LOPRESSOR) 50 MG tablet Take 1 tablet (50 mg total) by mouth 2 (two) times daily. 03/06/23  Yes Johnson, Clanford L, MD  sodium bicarbonate 650 MG tablet Take 650 mg by mouth 2 (two) times daily.   Yes [provider]  warfarin (COUMADIN) 2.5 MG tablet Take 1 tablet daily or as directed by  [facility] 04/04/23  Yes Zaraya Delauder P, MD  fluticasone (FLONASE) 50 MCG/ACT nasal spray Place 2 sprays into both nostrils daily. Patient not taking: Reported on 05/08/2023 04/03/23   Shade Flood, MD    Inpatient Medications: Scheduled Meds:  calcitRIOL  0.5 mcg Oral Daily   Chlorhexidine Gluconate Cloth  6 each Topical Q0600   furosemide  80 mg Intravenous Q12H   metoprolol tartrate  50 mg Oral BID   sodium bicarbonate  650 mg Oral BID   Continuous Infusions:  PRN  Meds: acetaminophen **OR** acetaminophen, ondansetron **OR** ondansetron (ZOFRAN) IV, polyethylene glycol  Allergies:    Allergies  Allergen Reactions   Ibuprofen Other (See Comments)    pcp said raises her BP    Social History:   Social History   Socioeconomic History   Marital status: Married    Spouse name: Not on file   Number of children: 2   Years of education: Not on file   Highest education level: Not on file  Occupational History   Not on file  Tobacco Use   Smoking status: Never    Passive exposure: Never   Smokeless tobacco: Never  Vaping Use   Vaping Use: Never used  Substance and Sexual Activity   Alcohol use: No   Drug use: No   Sexual activity: Yes  Other Topics Concern   Not on file  Social History Narrative   Not on file   Social Determinants of Health   Financial Resource Strain: Low Risk  (11/28/2022)   Overall Financial Resource Strain (CARDIA)    Difficulty of Paying Living Expenses: Not hard at all  Food Insecurity: No Food Insecurity (05/07/2023)   Hunger Vital Sign    Worried About Running Out of Food in the Last Year: Never true    Ran Out of Food in the Last Year: Never true  Transportation Needs: No Transportation Needs (05/07/2023)   PRAPARE - Administrator, Civil Service (Medical): No    Lack of Transportation (Non-Medical): No  Physical Activity: Insufficiently Active (11/28/2022)   Exercise Vital Sign    Days of Exercise per Week: 3 days    Minutes of Exercise per Session: 30 min  Stress: No Stress Concern Present (11/28/2022)   [employer] of Occupational Health - Occupational Stress Questionnaire    Feeling of Stress : Not at all  Social Connections: Socially Integrated (11/28/2022)   Social Connection and Isolation Panel [NHANES]    Frequency of Communication with Friends and Family: Three times a week    Frequency of Social Gatherings with Friends and Family: Once a week    Attends Religious Services: More than  4 times per year    Active Member of [employer] or Organizations: Yes    Attends Engineer, structural: More than 4 times per year    Marital Status: Married  Catering manager Violence: Not At Risk (05/07/2023)   Humiliation, Afraid, Rape, and Kick questionnaire    Fear of Current or Ex-Partner: No    Emotionally Abused: No    Physically Abused: No    Sexually Abused: No    Family History:    Family History  Problem Relation Age of Onset   Liver cancer Neg Hx    Colon cancer Neg Hx      ROS:  Please see the history of present illness.  All other ROS reviewed and negative.     Physical Exam/Data:   Vitals:   05/07/23  2337 05/08/23 0211 05/08/23 0500 05/08/23 0700  BP: (!) 150/115 129/77  (!) 166/116  Pulse: (!) 127 99  (!) 101  Resp: 19 19  18   Temp: 97.9 F (36.6 C) 97.8 F (36.6 C)  98.4 F (36.9 C)  TempSrc: Oral Oral  Oral  SpO2: 100% 98%  99%  Weight:   105.7 kg     Intake/Output Summary (Last 24 hours) at 05/08/2023 1032 Last data filed at 05/07/2023 2034 Gross per 24 hour  Intake 120 ml  Output --  Net 120 ml      05/08/2023    5:00 AM 05/07/2023    2:27 PM 04/03/2023   11:28 AM  Last 3 Weights  Weight (lbs) 233 lb 0.4 oz 233 lb 3.2 oz 204 lb 3.2 oz  Weight (kg) 105.7 kg 105.779 kg 92.625 kg     Body mass index is 41.28 kg/m.  General:  Well nourished, well developed, in no acute distress HEENT: normal Neck: no JVD Vascular: No carotid bruits; Distal pulses 2+ bilaterally Cardiac:  normal S1, S2; Irregularly irregular.  Lungs:  clear to auscultation bilaterally, no wheezing, rhonchi or rales  Abd: soft, nontender, no hepatomegaly  Ext: no edema Musculoskeletal:  No deformities, BUE and BLE strength normal and equal Skin: warm and dry  Neuro:  CNs 2-12 intact, no focal abnormalities noted Psych:  Normal affect   EKG:  The EKG was personally reviewed and demonstrates: Atrial fibrillation with RVR, HR 105. Telemetry:  Telemetry was personally  reviewed and demonstrates: Atrial Fibrillation with rates in the 100's to 110's.   Relevant CV Studies:  Echocardiogram: 02/2023 IMPRESSIONS   1. Left ventricular ejection fraction, by estimation, is 40 to 45%. The  left ventricle has mildly decreased function. The left ventricle  demonstrates global hypokinesis. There is moderate left ventricular  hypertrophy. Left ventricular diastolic function   could not be evaluated.   2. Right ventricular systolic function is normal. The right ventricular  size is normal. There is normal pulmonary artery systolic pressure. The  estimated right ventricular systolic pressure is 28.0 mmHg.   3. Right atrial size was moderately dilated.   4. The mitral valve is normal in structure. Mild mitral valve  regurgitation. No evidence of mitral stenosis.   5. The aortic valve is tricuspid. There is mild calcification of the  aortic valve. Aortic valve regurgitation is not visualized. No aortic  stenosis is present.   6. The inferior vena cava is normal in size with greater than 50%  respiratory variability, suggesting right atrial pressure of 3 mmHg.   Comparison(s): No prior Echocardiogram.   Laboratory Data:  High Sensitivity Troponin:   Recent Labs  Lab 05/07/23 1844  TROPONINIHS 20*     Chemistry Recent Labs  Lab 05/07/23 1844 05/08/23 0446  NA 141 141  K 4.6 4.6  CL 109 111  CO2 18* 15*  GLUCOSE 114* 77  BUN 99* 99*  CREATININE 11.44* 11.67*  CALCIUM 8.2* 8.4*  GFRNONAA 3* 3*  ANIONGAP 14 15    Recent Labs  Lab 05/07/23 1844  PROT 6.6  ALBUMIN 2.8*  AST 28  ALT 50*  ALKPHOS 320*  BILITOT 0.7   Lipids No results for input(s): "CHOL", "TRIG", "HDL", "LABVLDL", "LDLCALC", "CHOLHDL" in the last 168 hours.  Hematology Recent Labs  Lab 05/07/23 1844 05/08/23 0446  WBC 8.5 8.4  RBC 4.15 3.84*  HGB 11.9* 11.0*  HCT 39.6 35.9*  MCV 95.4 93.5  MCH 28.7 28.6  MCHC 30.1 30.6  RDW 16.5* 16.4*  PLT 193 193   Thyroid No  results for input(s): "TSH", "FREET4" in the last 168 hours.  BNP Recent Labs  Lab 05/07/23 1844  BNP 3,927.0*    DDimer No results for input(s): "DDIMER" in the last 168 hours.   Radiology/Studies:  DG Chest Port 1 View  Result Date: 05/07/2023 CLINICAL DATA:  Shortness of breath. EXAM: PORTABLE CHEST 1 VIEW COMPARISON:  03/04/2023 FINDINGS: Stable moderate cardiomegaly and pulmonary interstitial prominence. No evidence of acute infiltrate or pleural effusion. IMPRESSION: Stable cardiomegaly and pulmonary interstitial prominence. No acute findings. Electronically Signed   By: Danae Orleans M.D.   On: 05/07/2023 18:53     Assessment and Plan:   Patient is a 67 year old F known to have HFmrEF (EF 40-45% by echo in 02/2023), persistent atrial fibrillation (diagnosed in 02/2023), HTN, HLD, asthma and Stage 5 CKD is currently admitted to hospitalist team for the management of acute systolic heart failure exacerbation likely secondary to progression of chronic kidney disease versus A-fib with RVR.  # Acute systolic heart failure exacerbation -Agree with IV Lasix 80 mg twice daily -She is being transferred to: Tomorrow for IR guided dialysis catheter insertion and initiation of dialysis. -Outpatient cardiology follow-up  # Persistent A-fib with RVR -Continue metoprolol tartrate 50 mg twice daily -Systemic AC on hold, can resume after dialysis catheter insertion   I have spent a total of 70 minutes with patient reviewing chart , telemetry, EKGs, labs and examining patient as well as establishing an assessment and plan that was discussed with the patient.  > 50% of time was spent in direct patient care.     Patricia Shevchenko Verne Spurr, MD [place]  [facility]  11:16 AM

## 2023-05-09 ENCOUNTER — Ambulatory Visit (HOSPITAL_COMMUNITY)
Admission: RE | Admit: 2023-05-09 | Discharge: 2023-05-09 | Disposition: A | Discharge status: Home / Self Care | Payer: No Typology Code available for payment source | Source: Ambulatory Visit | Attending: Nephrology | Admitting: Nephrology

## 2023-05-09 DIAGNOSIS — Z79899 Other long term (current) drug therapy: Secondary | ICD-10-CM | POA: Insufficient documentation

## 2023-05-09 DIAGNOSIS — I5021 Acute systolic (congestive) heart failure: Secondary | ICD-10-CM | POA: Diagnosis not present

## 2023-05-09 DIAGNOSIS — N186 End stage renal disease: Secondary | ICD-10-CM | POA: Insufficient documentation

## 2023-05-09 DIAGNOSIS — N2581 Secondary hyperparathyroidism of renal origin: Secondary | ICD-10-CM | POA: Insufficient documentation

## 2023-05-09 DIAGNOSIS — I120 Hypertensive chronic kidney disease with stage 5 chronic kidney disease or end stage renal disease: Secondary | ICD-10-CM | POA: Insufficient documentation

## 2023-05-09 DIAGNOSIS — Z4901 Encounter for fitting and adjustment of extracorporeal dialysis catheter: Secondary | ICD-10-CM | POA: Diagnosis not present

## 2023-05-09 DIAGNOSIS — I5043 Acute on chronic combined systolic (congestive) and diastolic (congestive) heart failure: Principal | ICD-10-CM | POA: Insufficient documentation

## 2023-05-09 DIAGNOSIS — E8779 Other fluid overload: Secondary | ICD-10-CM | POA: Diagnosis not present

## 2023-05-09 DIAGNOSIS — D631 Anemia in chronic kidney disease: Secondary | ICD-10-CM | POA: Insufficient documentation

## 2023-05-09 DIAGNOSIS — N179 Acute kidney failure, unspecified: Secondary | ICD-10-CM | POA: Insufficient documentation

## 2023-05-09 DIAGNOSIS — I4891 Unspecified atrial fibrillation: Secondary | ICD-10-CM | POA: Insufficient documentation

## 2023-05-09 DIAGNOSIS — E872 Acidosis, unspecified: Secondary | ICD-10-CM | POA: Insufficient documentation

## 2023-05-09 DIAGNOSIS — I4819 Other persistent atrial fibrillation: Secondary | ICD-10-CM | POA: Diagnosis not present

## 2023-05-09 HISTORY — PX: IR FLUORO GUIDE CV LINE RIGHT: IMG2283

## 2023-05-09 LAB — BASIC METABOLIC PANEL
Anion gap: 13 (ref 5–15)
BUN: 103 mg/dL — ABNORMAL HIGH (ref 8–23)
CO2: 18 mmol/L — ABNORMAL LOW (ref 22–32)
Calcium: 8.4 mg/dL — ABNORMAL LOW (ref 8.9–10.3)
Chloride: 109 mmol/L (ref 98–111)
Creatinine, Ser: 11.83 mg/dL — ABNORMAL HIGH (ref 0.44–1.00)
GFR, Estimated: 3 mL/min — ABNORMAL LOW (ref >60)
Glucose, Bld: 105 mg/dL — ABNORMAL HIGH (ref 70–99)
Potassium: 4.4 mmol/L (ref 3.5–5.1)
Sodium: 140 mmol/L (ref 135–145)

## 2023-05-09 LAB — CBC
HCT: 36.2 % (ref 36.0–46.0)
Hemoglobin: 11.2 g/dL — ABNORMAL LOW (ref 12.0–15.0)
MCH: 28.9 pg (ref 26.0–34.0)
MCHC: 30.9 g/dL (ref 30.0–36.0)
MCV: 93.3 fL (ref 80.0–100.0)
Platelets: 179 10*3/uL (ref 150–400)
RBC: 3.88 MIL/uL (ref 3.87–5.11)
RDW: 16.1 % — ABNORMAL HIGH (ref 11.5–15.5)
WBC: 7.6 10*3/uL (ref 4.0–10.5)
nRBC: 0 % (ref 0.0–0.2)

## 2023-05-09 LAB — PHOSPHORUS: Phosphorus: 10.7 mg/dL — ABNORMAL HIGH (ref 2.5–4.6)

## 2023-05-09 LAB — IRON AND TIBC
Iron: 70 ug/dL (ref 28–170)
Saturation Ratios: 31 % (ref 10.4–31.8)
TIBC: 226 ug/dL — ABNORMAL LOW (ref 250–450)
UIBC: 156 ug/dL

## 2023-05-09 LAB — FERRITIN: Ferritin: 126 ng/mL (ref 11–307)

## 2023-05-09 LAB — HEPATITIS B SURFACE ANTIBODY, QUANTITATIVE: Hep B S AB Quant (Post): <3.5 m[IU]/mL — ABNORMAL LOW (ref >9.9)

## 2023-05-09 LAB — MAGNESIUM: Magnesium: 2.2 mg/dL (ref 1.7–2.4)

## 2023-05-09 LAB — HEPATITIS B SURFACE ANTIGEN

## 2023-05-09 MED ORDER — MIDAZOLAM HCL 2 MG/2ML IJ SOLN
INTRAMUSCULAR | Status: AC | PRN
  Administered 2023-05-09: 1 mg via INTRAVENOUS

## 2023-05-09 MED ORDER — ALTEPLASE 2 MG IJ SOLR: 2.0000 mg | Freq: Once | INTRAMUSCULAR | Status: DC | PRN

## 2023-05-09 MED ORDER — CEFAZOLIN SODIUM-DEXTROSE 2-4 GM/100ML-% IV SOLN
2.0000 g | Freq: Once | INTRAVENOUS | Status: AC
  Administered 2023-05-09: 2 g via INTRAVENOUS

## 2023-05-09 MED ORDER — MIDAZOLAM HCL 2 MG/2ML IJ SOLN
INTRAMUSCULAR | Status: AC
  Filled 2023-05-09: qty 2

## 2023-05-09 MED ORDER — CEFAZOLIN SODIUM-DEXTROSE 2-4 GM/100ML-% IV SOLN
INTRAVENOUS | Status: AC
  Filled 2023-05-09: qty 100

## 2023-05-09 MED ORDER — SEVELAMER CARBONATE 800 MG PO TABS
800.0000 mg | ORAL_TABLET | Freq: Three times a day (TID) | ORAL | Status: DC
  Administered 2023-05-10 – 2023-05-12 (×7): 800 mg via ORAL
  Filled 2023-05-09 (×8): qty 1

## 2023-05-09 MED ORDER — HEPARIN SOD (PORK) LOCK FLUSH 100 UNIT/ML IV SOLN
INTRAVENOUS | Status: AC | PRN
  Administered 2023-05-09: 2200 [IU]

## 2023-05-09 MED ORDER — ONDANSETRON HCL 4 MG/2ML IJ SOLN
INTRAMUSCULAR | Status: AC
  Filled 2023-05-09: qty 2

## 2023-05-09 MED ORDER — LIDOCAINE HCL 1 % IJ SOLN
INTRAMUSCULAR | Status: AC
  Filled 2023-05-09: qty 20

## 2023-05-09 MED ORDER — FUROSEMIDE 10 MG/ML IJ SOLN
80.0000 mg | Freq: Once | INTRAMUSCULAR | Status: AC
  Administered 2023-05-09: 80 mg via INTRAVENOUS
  Filled 2023-05-09: qty 8

## 2023-05-09 MED ORDER — HEPARIN SODIUM (PORCINE) 1000 UNIT/ML DIALYSIS
1000.0000 [IU] | INTRAMUSCULAR | Status: DC | PRN
  Administered 2023-05-09: 3200 [IU]
  Administered 2023-05-10: 1000 [IU]
  Administered 2023-05-11: 3200 [IU]
  Administered 2023-05-14: 1000 [IU]

## 2023-05-09 MED ORDER — FENTANYL CITRATE (PF) 100 MCG/2ML IJ SOLN
INTRAMUSCULAR | Status: AC | PRN
  Administered 2023-05-09: 50 ug via INTRAVENOUS

## 2023-05-09 MED ORDER — HEPARIN SODIUM (PORCINE) 1000 UNIT/ML IJ SOLN
INTRAMUSCULAR | Status: AC
  Filled 2023-05-09: qty 6

## 2023-05-09 MED ORDER — HEPARIN SODIUM (PORCINE) 1000 UNIT/ML IJ SOLN
INTRAMUSCULAR | Status: AC
  Filled 2023-05-09: qty 10

## 2023-05-09 MED ORDER — ONDANSETRON HCL 4 MG/2ML IJ SOLN
INTRAMUSCULAR | Status: AC | PRN
  Administered 2023-05-09: 4 mg via INTRAVENOUS

## 2023-05-09 MED ORDER — FENTANYL CITRATE (PF) 100 MCG/2ML IJ SOLN
INTRAMUSCULAR | Status: AC
  Filled 2023-05-09: qty 2

## 2023-05-09 NOTE — Progress Notes (Signed)
Progress Note  Patient Name: Patricia Wu Date of Encounter: 05/09/2023  Primary Cardiologist: Marjo Bicker, MD  Subjective   No acute events overnight.  Patient is for transfer to [facility] for IR guided tunneled dialysis catheter insertion today.  She complained of DOE with walking yesterday.  Continues to have pitting lower EXTR swelling.  Inpatient Medications    Scheduled Meds:  calcitRIOL  0.5 mcg Oral Daily   Chlorhexidine Gluconate Cloth  6 each Topical Q0600   metoprolol tartrate  50 mg Oral BID   sevelamer carbonate  800 mg Oral TID WC   Continuous Infusions:  PRN Meds: acetaminophen **OR** acetaminophen, ondansetron **OR** ondansetron (ZOFRAN) IV, polyethylene glycol   Vital Signs    Vitals:   05/08/23 2103 05/09/23 0412 05/09/23 0701 05/09/23 0709  BP: (!) 140/98 (!) 125/93  (!) 136/113  Pulse: 95 77  (!) 109  Resp: 18 18  (!) 22  Temp: (!) 97.4 F (36.3 C) 97.7 F (36.5 C)  97.7 F (36.5 C)  TempSrc: Oral Oral  Oral  SpO2: 100% 100%  99%  Weight:   103.9 kg     Intake/Output Summary (Last 24 hours) at 05/09/2023 1005 Last data filed at 05/09/2023 0900 Gross per 24 hour  Intake 100 ml  Output 0 ml  Net 100 ml   Filed Weights   05/08/23 0500 05/09/23 0701  Weight: 105.7 kg 103.9 kg    Telemetry     Personally reviewed, in atrial fibrillation, HR 100s  ECG    Not performed today  Physical Exam   GEN: No acute distress.   Neck: No JVD. Cardiac: RRR, no murmur, rub, or gallop.  Respiratory: Nonlabored. Clear to auscultation bilaterally. GI: Soft, nontender, bowel sounds present. MS: 2-3+ pitting edema; No deformity. Neuro:  Nonfocal. Psych: Alert and oriented x 3. Normal affect.  Labs    Chemistry Recent Labs  Lab 05/07/23 1844 05/08/23 0446 05/09/23 0501  NA 141 141 140  K 4.6 4.6 4.4  CL 109 111 109  CO2 18* 15* 18*  GLUCOSE 114* 77 105*  BUN 99* 99* 103*  CREATININE 11.44* 11.67* 11.83*  CALCIUM 8.2* 8.4*  8.4*  PROT 6.6  --   --   ALBUMIN 2.8*  --   --   AST 28  --   --   ALT 50*  --   --   ALKPHOS 320*  --   --   BILITOT 0.7  --   --   GFRNONAA 3* 3* 3*  ANIONGAP 14 15 13      Hematology Recent Labs  Lab 05/07/23 1844 05/08/23 0446 05/09/23 0501  WBC 8.5 8.4 7.6  RBC 4.15 3.84* 3.88  HGB 11.9* 11.0* 11.2*  HCT 39.6 35.9* 36.2  MCV 95.4 93.5 93.3  MCH 28.7 28.6 28.9  MCHC 30.1 30.6 30.9  RDW 16.5* 16.4* 16.1*  PLT 193 193 179    Cardiac Enzymes Recent Labs  Lab 05/07/23 1844  TROPONINIHS 20*    BNP Recent Labs  Lab 05/07/23 1844  BNP 3,927.0*     DDimerNo results for input(s): "DDIMER" in the last 168 hours.   Radiology    DG Chest Port 1 View  Result Date: 05/07/2023 CLINICAL DATA:  Shortness of breath. EXAM: PORTABLE CHEST 1 VIEW COMPARISON:  03/04/2023 FINDINGS: Stable moderate cardiomegaly and pulmonary interstitial prominence. No evidence of acute infiltrate or pleural effusion. IMPRESSION: Stable cardiomegaly and pulmonary interstitial prominence. No acute findings. Electronically Signed  By: Danae Orleans M.D.   On: 05/07/2023 18:53     Assessment & Plan   Patient is a 67 year old F known to have HFmrEF (EF 40-45% by echo in 02/2023), persistent atrial fibrillation (diagnosed in 02/2023), HTN, HLD, asthma and Stage 5 CKD is currently admitted to hospitalist team for the management of acute systolic heart failure exacerbation likely secondary to progression of chronic kidney disease.  # Acute systolic heart failure exacerbation (LVEF 40 to 45% in 3/24) likely tachycardia induced cardiomyopathy however ischemia will need to be ruled out due to risk factors -Continue IV Lasix 80 mg twice daily -Not on ACE/ARB/ARNI/MRA/SGLT2 inhibitors due to CKD stage IV-V -Outpatient ischemia evaluation   # Persistent A-fib with RVR, HR 100s -Continue metoprolol tartrate 50 mg twice daily. Can administer IV metoprolol pushes if HR>110 for the procedure. Otherwise, can  uptitrate metoprolol tartrate from 50 mg to 75 mg twice daily. -Coumadin is held for the tunneled dialysis catheter insertion.  Will need to resume after the procedure when okay from bleeding standpoint.   I have spent a total of 30 minutes with patient reviewing chart , telemetry, EKGs, labs and examining patient as well as establishing an assessment and plan that was discussed with the patient.  > 50% of time was spent in direct patient care.  '  Signed, Marjo Bicker, MD  05/09/2023, 10:05 AM

## 2023-05-09 NOTE — Progress Notes (Signed)
    Interval chart history reviewed. The patient is going to [facility] today for tunneled HD catheter placement. Coumadin has been held for this and INR was at 1.6 on 05/07/2023. By review of telemetry, she remains in atrial fibrillation and HR has been in the 90's to low-100's. She just received Lopressor 50mg  this AM and would follow rates with this and also see how HR responds following HD today. If HR remains elevated following volume removal, can titrate from 50mg  BID to 75mg  BID tomorrow.   Signed, Ellsworth Lennox, PA-C 05/09/2023, 8:53 AM

## 2023-05-09 NOTE — Progress Notes (Signed)
   05/09/23 0709  Assess: MEWS Score  Temp 97.7 F (36.5 C)  BP (!) 136/113 (primary RN Morrie Sheldon notified)  Pulse Rate (!) 109  Resp (!) 22 (primary RN [employer] notified)  SpO2 99 %  O2 Device Room Air  Assess: MEWS Score  MEWS Temp 0  MEWS Systolic 0  MEWS Pulse 1  MEWS RR 1  MEWS LOC 0  MEWS Score 2  MEWS Score Color Yellow  Assess: SIRS CRITERIA  SIRS Temperature  0  SIRS Pulse 1  SIRS Respirations  1  SIRS WBC 0  SIRS Score Sum  2   Pt b/p remains high, pulse rate elevated, respirations elevated. Pt resting in bed at this time, no c/o pain, no distress noted. Will administer 1000 scheduled medications with sip of water. Pt remains NPO at this time awaiting transfer via [facility] to IR for HD cath placement. Plan of care ongoing.

## 2023-05-09 NOTE — Procedures (Signed)
Vascular and Interventional Radiology Procedure Note  Patient: Patricia Wu DOB: February 17, 1956 Medical Record Number: [id number] Note Date/Time: 05/09/23 12:50 PM   Performing Physician: Roanna Banning, MD Assistant(s): None  Diagnosis: AKI requiring Hemodialysis  Procedure: TUNNELED HEMODIALYSIS CATHETER PLACEMENT  Anesthesia: Conscious Sedation Complications: None Estimated Blood Loss: Minimal Specimens:  None  Findings:  Successful placement of right-sided, 19 cm (tip-to-cuff), tunneled hemodialysis catheter with the tip of the catheter in the proximal right atrium.  Plan: Catheter ready for use.  See detailed procedure note with images in PACS. The patient tolerated the procedure well without incident or complication and was returned to Floor Bed in stable condition.    Roanna Banning, MD [facility]   Pager. [phone] Clinic. [phone]

## 2023-05-09 NOTE — Progress Notes (Signed)
PROGRESS NOTE    Patricia Wu  ZOX:[id number] DOB: 1956-06-03 DOA: 05/07/2023 PCP: Shade Flood, MD   Brief Narrative:    Patricia Wu is a 67 y.o. female with medical history significant for atrial fibrillation, CKD5 , hypertension.  Patient was sent to the ED from cardiologist office reports of weight gain.  Per chart, patient has gained about 29 pounds since 4/10.  Patient was admitted with AKI on CKD stage V, now ESRD with associated hypervolemia as well as atrial fibrillation with RVR.  Plan is for [facility] placement today and then hemodialysis initiation.  Assessment & Plan:   Principal Problem:   Hypervolemia Active Problems:   Atrial fibrillation with RVR (HCC)   CKD (chronic kidney disease) stage 5, GFR less than 15 ml/min (HCC)  Assessment and Plan:  AKI on CKD stage V with hypervolemia With 3+ pitting bilateral lower extremity edema, progressive chronic renal disease.  BNP elevated at 3927.  Recent echo 02/2023 EF of 40 to 45%.  -Continue with IV Lasix 80 mg this a.m. -Strict input output, daily weights, daily BMP -Plan for [facility] placement for hemodialysis per nephrology.  Plan to have this placed by 5/16 with n.p.o. after midnight and resume renal diet afterwards -Hold Warfarin    Atrial fibrillation with RVR (HCC)-now rate controlled Rate controlled.  On anticoagulation with warfarin.  INR 1.6. - Hold Warfarin for now -Daily PT/INR -Appreciate cardiology evaluation with plans to continue metoprolol  Hypertension-controlled -Continue diuresis  Anemia of CKD-stable -Transfuse for hemoglobin less than 7 monitor CBC  Morbid obesity -BMI 41.28   DVT prophylaxis: Heparin Code Status: Full Family Communication: Daughter at bedside 5/16 Disposition Plan:  Status is: Inpatient Remains inpatient appropriate because: Need for IV medications.   Consultants:  Nephrology Cardiology  Procedures:  Anticipated [facility] placement 5/16  Antimicrobials:   None   Subjective: Patient seen and evaluated today with no new acute complaints or concerns. No acute concerns or events noted overnight.  She is awaiting dialysis catheter placement today and then initiation of dialysis later.  Objective: Vitals:   05/08/23 2103 05/09/23 0412 05/09/23 0701 05/09/23 0709  BP: (!) 140/98 (!) 125/93  (!) 136/113  Pulse: 95 77  (!) 109  Resp: 18 18  (!) 22  Temp: (!) 97.4 F (36.3 C) 97.7 F (36.5 C)  97.7 F (36.5 C)  TempSrc: Oral Oral  Oral  SpO2: 100% 100%  99%  Weight:   103.9 kg     Intake/Output Summary (Last 24 hours) at 05/09/2023 0839 Last data filed at 05/08/2023 0933 Gross per 24 hour  Intake 0 ml  Output --  Net 0 ml   Filed Weights   05/08/23 0500 05/09/23 0701  Weight: 105.7 kg 103.9 kg    Examination:  General exam: Appears calm and comfortable, morbidly obese Respiratory system: Clear to auscultation. Respiratory effort normal. Cardiovascular system: S1 & S2 heard, RRR.  Gastrointestinal system: Abdomen is soft Central nervous system: Alert and awake Extremities: Bilateral pitting edema Skin: No significant lesions noted Psychiatry: Flat affect.    Data Reviewed: I have personally reviewed following labs and imaging studies  CBC: Recent Labs  Lab 05/07/23 1844 05/08/23 0446 05/09/23 0501  WBC 8.5 8.4 7.6  HGB 11.9* 11.0* 11.2*  HCT 39.6 35.9* 36.2  MCV 95.4 93.5 93.3  PLT 193 193 179   Basic Metabolic Panel: Recent Labs  Lab 05/07/23 1844 05/08/23 0446 05/09/23 0501  NA 141 141 140  K 4.6 4.6  4.4  CL 109 111 109  CO2 18* 15* 18*  GLUCOSE 114* 77 105*  BUN 99* 99* 103*  CREATININE 11.44* 11.67* 11.83*  CALCIUM 8.2* 8.4* 8.4*  MG  --   --  2.2  PHOS  --   --  10.7*   GFR: Estimated Creatinine Clearance: 5.4 mL/min (A) (by C-G formula based on SCr of 11.83 mg/dL (H)). Liver Function Tests: Recent Labs  Lab 05/07/23 1844  AST 28  ALT 50*  ALKPHOS 320*  BILITOT 0.7  PROT 6.6  ALBUMIN  2.8*   No results for input(s): "LIPASE", "AMYLASE" in the last 168 hours. No results for input(s): "AMMONIA" in the last 168 hours. Coagulation Profile: Recent Labs  Lab 05/07/23 1844  INR 1.6*   Cardiac Enzymes: No results for input(s): "CKTOTAL", "CKMB", "CKMBINDEX", "TROPONINI" in the last 168 hours. BNP (last 3 results) No results for input(s): "PROBNP" in the last 8760 hours. HbA1C: No results for input(s): "HGBA1C" in the last 72 hours. CBG: No results for input(s): "GLUCAP" in the last 168 hours. Lipid Profile: No results for input(s): "CHOL", "HDL", "LDLCALC", "TRIG", "CHOLHDL", "LDLDIRECT" in the last 72 hours. Thyroid Function Tests: No results for input(s): "TSH", "T4TOTAL", "FREET4", "T3FREE", "THYROIDAB" in the last 72 hours. Anemia Panel: Recent Labs    05/09/23 0501  FERRITIN 126  TIBC 226*  IRON 70   Sepsis Labs: No results for input(s): "PROCALCITON", "LATICACIDVEN" in the last 168 hours.  No results found for this or any previous visit (from the past 240 hour(s)).       Radiology Studies: DG Chest Port 1 View  Result Date: 05/07/2023 CLINICAL DATA:  Shortness of breath. EXAM: PORTABLE CHEST 1 VIEW COMPARISON:  03/04/2023 FINDINGS: Stable moderate cardiomegaly and pulmonary interstitial prominence. No evidence of acute infiltrate or pleural effusion. IMPRESSION: Stable cardiomegaly and pulmonary interstitial prominence. No acute findings. Electronically Signed   By: Danae Orleans M.D.   On: 05/07/2023 18:53        Scheduled Meds:  calcitRIOL  0.5 mcg Oral Daily   Chlorhexidine Gluconate Cloth  6 each Topical Q0600   metoprolol tartrate  50 mg Oral BID   sevelamer carbonate  800 mg Oral TID WC     LOS: 2 days    Time spent: 35 minutes    Patricia Rembold Hoover Brunette, DO [facility]  If 7PM-7AM, please contact night-coverage [email or web address] 05/09/2023, 8:39 AM

## 2023-05-09 NOTE — Progress Notes (Signed)
[facility] Progress Note    Assessment/ Plan:   AKI on CKD5, now ESRD -followed by Dr. Arrie Aran as outpatient -starting HD given early uremic symptoms and hypervolemia. TDC to be placed by IR at [facility] 5/16 (will be transported back here). HD #1 5/16 -slow start protocol. HD#2 5/17 and #3 planned for Sat 5/18 -appreciate SW/CM assistance for outpatient HD placement -plan for AVG placement when Afib is better controlled (anticipating that this will be happening as an outpatient) -Avoid nephrotoxic medications including NSAIDs and iodinated intravenous contrast exposure unless the latter is absolutely indicated.  Preferred narcotic agents for pain control are hydromorphone, fentanyl, and methadone. Morphine should not be used. Avoid Baclofen and avoid oral sodium phosphate and magnesium citrate based laxatives / bowel preps. Continue strict Input and Output monitoring. Will monitor the patient closely with you and intervene or adjust therapy as indicated by changes in clinical status/labs    Hypervolemia -currently on lasix 80mg  IV BID, will give AM dose and then will d/c given that she is starting HD today -consider repeat echo -follow strict I/O, daily weights -HD/UF as above   Afib w/ RVR -per primary, A/C on hold for now. On metoprolol   HTN -diuresis as above, UF as tolerated   Anemia of CKD -tranfuse prn for hgb <7. Hgb currently acceptable for CKD. Of note, receives retacrit 20,000 units outpatient (last dose 4/3). Iron panel acceptable, can resume ESA if/when indicated   Secondary hyperparathyroidism/CKD-MBD -PTH pending. PO4 10.7. Cca wnl. On calcitriol. Starting renvela.   Metabolic acidosis -will d/c nahco3 PO now that she's starting dialysis. CO2 up to 18 today  Subjective:   Patient seen and examined bedside. Husband and daughter at the bedside. No acute events, no new complaints. Uop not charted.   Objective:   BP (!) 136/113 (BP Location: Left Arm)  Comment: primary RN Ashley notified  Pulse (!) 109   Temp 97.7 F (36.5 C) (Oral)   Resp (!) 22 Comment: primary RN Ashley notified  Wt 105.7 kg   SpO2 99%   BMI 41.28 kg/m   Intake/Output Summary (Last 24 hours) at 05/09/2023 0735 Last data filed at 05/08/2023 0933 Gross per 24 hour  Intake 0 ml  Output --  Net 0 ml   Weight change:   Physical Exam: Gen: NAD CVS: irreg irreg Resp: CTA b/l, on room air Abd: obese, soft Ext: 2-3+ pitting edema b/l Les Neuro: awake, alert  Imaging: DG Chest Port 1 View  Result Date: 05/07/2023 CLINICAL DATA:  Shortness of breath. EXAM: PORTABLE CHEST 1 VIEW COMPARISON:  03/04/2023 FINDINGS: Stable moderate cardiomegaly and pulmonary interstitial prominence. No evidence of acute infiltrate or pleural effusion. IMPRESSION: Stable cardiomegaly and pulmonary interstitial prominence. No acute findings. Electronically Signed   By: Danae Orleans M.D.   On: 05/07/2023 18:53    Labs: BMET Recent Labs  Lab 05/07/23 1844 05/08/23 0446 05/09/23 0501  NA 141 141 140  K 4.6 4.6 4.4  CL 109 111 109  CO2 18* 15* 18*  GLUCOSE 114* 77 105*  BUN 99* 99* 103*  CREATININE 11.44* 11.67* 11.83*  CALCIUM 8.2* 8.4* 8.4*  PHOS  --   --  10.7*   CBC Recent Labs  Lab 05/07/23 1844 05/08/23 0446 05/09/23 0501  WBC 8.5 8.4 7.6  HGB 11.9* 11.0* 11.2*  HCT 39.6 35.9* 36.2  MCV 95.4 93.5 93.3  PLT 193 193 179    Medications:     calcitRIOL  0.5 mcg Oral  Daily   Chlorhexidine Gluconate Cloth  6 each Topical Q0600   furosemide  80 mg Intravenous Q12H   metoprolol tartrate  50 mg Oral BID   sodium bicarbonate  650 mg Oral BID      Anthony Sar, MD [facility] 05/09/2023, 7:35 AM

## 2023-05-09 NOTE — Progress Notes (Signed)
  INITIAL HEMODIALYSIS TREATMENT NOTE:  First ever HD session completed using newly inserted RIJ TDC.  Goal met: 1 liter removed without interruption in UF.  All blood was returned.  Post-dialysis:  05/09/23 1915  Vital Signs  Temp 97.9 F (36.6 C)  Temp Source Oral  Pulse Rate 100  Pulse Rate Source Monitor  Resp 16  BP (!) 135/95  BP Location Left Arm  BP Method Automatic  Patient Position (if appropriate) Sitting  Oxygen Therapy  SpO2 98 %  O2 Device Room Air  Pain Assessment  Pain Score 0  Dialysis Weight  Weight 102.9 kg  Type of Weight Post-Dialysis  Post Treatment  Dialyzer Clearance Lightly streaked  Duration of HD Treatment -hour(s) 1.99 hour(s)  Hemodialysis Intake (mL) 0 mL  Liters Processed 23  Fluid Removed (mL) 1000 mL  Tolerated HD Treatment Yes  Post-Hemodialysis Comments Goal met    Arman Filter, RN AP K[facility]

## 2023-05-10 ENCOUNTER — Ambulatory Visit: Payer: No Typology Code available for payment source | Admitting: Nurse Practitioner

## 2023-05-10 ENCOUNTER — Encounter (HOSPITAL_COMMUNITY): Payer: Self-pay

## 2023-05-10 DIAGNOSIS — I5043 Acute on chronic combined systolic (congestive) and diastolic (congestive) heart failure: Secondary | ICD-10-CM | POA: Diagnosis not present

## 2023-05-10 DIAGNOSIS — Z992 Dependence on renal dialysis: Secondary | ICD-10-CM | POA: Diagnosis not present

## 2023-05-10 DIAGNOSIS — E6609 Other obesity due to excess calories: Secondary | ICD-10-CM

## 2023-05-10 DIAGNOSIS — I5021 Acute systolic (congestive) heart failure: Secondary | ICD-10-CM | POA: Diagnosis not present

## 2023-05-10 DIAGNOSIS — N186 End stage renal disease: Secondary | ICD-10-CM | POA: Diagnosis not present

## 2023-05-10 DIAGNOSIS — I4891 Unspecified atrial fibrillation: Secondary | ICD-10-CM | POA: Diagnosis not present

## 2023-05-10 DIAGNOSIS — I4819 Other persistent atrial fibrillation: Secondary | ICD-10-CM | POA: Diagnosis not present

## 2023-05-10 HISTORY — PX: IR US GUIDE VASC ACCESS RIGHT: IMG2390

## 2023-05-10 LAB — CBC
HCT: 38.4 % (ref 36.0–46.0)
Hemoglobin: 11.8 g/dL — ABNORMAL LOW (ref 12.0–15.0)
MCH: 28.6 pg (ref 26.0–34.0)
MCHC: 30.7 g/dL (ref 30.0–36.0)
MCV: 93.2 fL (ref 80.0–100.0)
Platelets: 162 10*3/uL (ref 150–400)
RBC: 4.12 MIL/uL (ref 3.87–5.11)
RDW: 16 % — ABNORMAL HIGH (ref 11.5–15.5)
WBC: 7.6 10*3/uL (ref 4.0–10.5)
nRBC: 0 % (ref 0.0–0.2)

## 2023-05-10 LAB — BASIC METABOLIC PANEL
Anion gap: 13 (ref 5–15)
BUN: 56 mg/dL — ABNORMAL HIGH (ref 8–23)
CO2: 22 mmol/L (ref 22–32)
Calcium: 8 mg/dL — ABNORMAL LOW (ref 8.9–10.3)
Chloride: 101 mmol/L (ref 98–111)
Creatinine, Ser: 8.12 mg/dL — ABNORMAL HIGH (ref 0.44–1.00)
GFR, Estimated: 5 mL/min — ABNORMAL LOW (ref >60)
Glucose, Bld: 115 mg/dL — ABNORMAL HIGH (ref 70–99)
Potassium: 4 mmol/L (ref 3.5–5.1)
Sodium: 136 mmol/L (ref 135–145)

## 2023-05-10 LAB — PARATHYROID HORMONE, INTACT (NO CA): PTH: 464 pg/mL — ABNORMAL HIGH (ref 15–65)

## 2023-05-10 LAB — MAGNESIUM: Magnesium: 1.8 mg/dL (ref 1.7–2.4)

## 2023-05-10 MED ORDER — WARFARIN - PHARMACIST DOSING INPATIENT: Freq: Every day | Status: DC

## 2023-05-10 MED ORDER — METOPROLOL TARTRATE 50 MG PO TABS
75.0000 mg | ORAL_TABLET | Freq: Two times a day (BID) | ORAL | Status: DC
  Administered 2023-05-10 – 2023-05-13 (×6): 75 mg via ORAL
  Filled 2023-05-10 (×6): qty 1

## 2023-05-10 MED ORDER — WARFARIN SODIUM 2 MG PO TABS
4.0000 mg | ORAL_TABLET | Freq: Once | ORAL | Status: AC
  Administered 2023-05-10: 4 mg via ORAL
  Filled 2023-05-10: qty 2

## 2023-05-10 NOTE — Progress Notes (Signed)
Progress Note   Patient: Patricia Wu:[id number] DOB: 1956-10-29 DOA: 05/07/2023     3 DOS: the patient was seen and examined on 05/10/2023   Brief hospital course: 67yo female with h/o afib, stage 5 CKD, and HTN who was sent to the ER on 5/14 from cardiology due to 23 pound weight gain.  She was diagnosed with ESRD with plan for [facility] placement and HD initiation.  TDC was placed on 5/16.  Assessment and Plan: * Stage 5 chronic kidney disease not on chronic dialysis (HCC) -Patient with known progressive CKD -Presented with progressively worsening creatinine as well as marked volume overload -Her symptoms appear to all be related to volume overload -Had temporary HD catheter placed for initiation of HD -Will needs vascular access mapping for fistula -Will need clipping -Management as per nephrology  -Hold Warfarin -Resume oral sodium bicarbonate  Class 2 obesity due to excess calories with body mass index (BMI) of 39.0 to 39.9 in adult -Body mass index is 39.17 kg/m..  -Weight loss should be encouraged -Outpatient PCP/bariatric medicine f/u encouraged   Hypervolemia associated with renal insufficiency -With 3+ pitting bilateral lower extremity edema, progressive chronic renal disease.  BNP elevated at 3927.   -Recent echo 02/2023 EF of 40 to 45%.  -Continue with IV lasix 60 mg twice daily- although volume overload will really need HD to effect significant change -Strict input output, daily weights, daily BMP  Atrial fibrillation, controlled (HCC) -Rate controlled on lopressor.   -On anticoagulation with warfarin prior to admission, INR 1.6. -It appears to be reasonable to resume Coumadin now, pharmacy to dose     Subjective: Patient was in HD at the time of my evaluation.  She remains significantly volume overloaded but reports that her legs feel slightly less heavy.  No new issues.  Physical Exam: Vitals:   05/10/23 1200 05/10/23 1216 05/10/23 1219 05/10/23 1220  BP:  (!) 135/104 128/81    Pulse: 90 87    Resp: 16 19    Temp:    98.4 F (36.9 C)  TempSrc:    Oral  SpO2:      Weight:   100.3 kg    General:  Appears calm and comfortable and is in NAD Eyes:   EOMI, normal lids, iris ENT:  grossly normal hearing, lips & tongue, mmm Neck:  no LAD, masses or thyromegaly Cardiovascular:  RRR, no m/r/g. No LE edema.  Respiratory:   CTA bilaterally with no wheezes/rales/rhonchi.  Normal respiratory effort. Abdomen:  soft, NT, ND Skin:  no rash or induration seen on limited exam Musculoskeletal:  grossly normal tone BUE/BLE, good ROM, no bony abnormality Psychiatric:  grossly normal mood and affect, speech fluent and appropriate, AOx3 Neurologic:  CN 2-12 grossly intact, moves all extremities in coordinated fashion   Radiological Exams on Admission: Independently reviewed - see discussion in A/P where applicable  IR Fluoro Guide CV Line Right  Result Date: 05/09/2023 INDICATION: AKI requiring HD. EXAM: TUNNELED CENTRAL VENOUS HEMODIALYSIS CATHETER PLACEMENT WITH ULTRASOUND AND FLUOROSCOPIC GUIDANCE MEDICATIONS: Ancef 2 gm IV . The antibiotic was given in an appropriate time interval prior to skin puncture. 4 mg Zofran IV. ANESTHESIA/SEDATION: Moderate (conscious) sedation was employed during this procedure. A total of Versed 1 mg and Fentanyl 50 mcg was administered intravenously. Moderate Sedation Time: 10 minutes. The patient's level of consciousness and vital signs were monitored continuously by radiology nursing throughout the procedure under my direct supervision. FLUOROSCOPY TIME:  Fluoroscopic dose; 0 mGy COMPLICATIONS:  None immediate. PROCEDURE: Informed written consent was obtained from the patient and/or patient's representative after a discussion of the risks, benefits, and alternatives to treatment. Questions regarding the procedure were encouraged and answered. The RIGHT neck and chest were prepped with chlorhexidine in a sterile fashion, and a  sterile drape was applied covering the operative field. Maximum barrier sterile technique with sterile gowns and gloves were used for the procedure. A timeout was performed prior to the initiation of the procedure. After creating a small venotomy incision, a micropuncture kit was utilized to access the internal jugular vein. Real-time ultrasound guidance was utilized for vascular access including the acquisition of a permanent ultrasound image documenting patency of the accessed vessel. The microwire was utilized to measure appropriate catheter length. A stiff Glidewire was advanced to the level of the IVC and the micropuncture sheath was exchanged for a peel-away sheath. A palindrome tunneled hemodialysis catheter measuring 19 cm from tip to cuff was tunneled in a retrograde fashion from the anterior chest wall to the venotomy incision. The catheter was then placed through the peel-away sheath with tips ultimately positioned within the superior aspect of the right atrium. Final catheter positioning was confirmed and documented with a spot radiographic image. The catheter aspirates and flushes normally. The catheter was flushed with appropriate volume heparin dwells. The catheter exit site was secured with a 2-0 Ethilon retention suture. The venotomy incision was closed with Dermabond. Dressings were applied. The patient tolerated the procedure well without immediate post procedural complication. IMPRESSION: Successful placement of 19 cm tip to cuff tunneled hemodialysis catheter via the RIGHT internal jugular vein The tip of the catheter is positioned within the proximal RIGHT atrium. The catheter is ready for immediate use. Roanna Banning, MD [facility] Radiology Electronically Signed   By: Roanna Banning M.D.   On: 05/09/2023 15:17    EKG: none since 5/14   Labs on Admission: I have personally reviewed the available labs and imaging studies at the time of the  admission.  Pertinent labs:    CO2 18 Glucose 105 BUN 103/Creatinine 11.83/GFR 3 Unremarkable CBC  Family Communication: None present; she is capable of communicating with family at this time  Disposition: Status is: Inpatient Remains inpatient appropriate because: initiation of HD  Planned Discharge Destination: Home    Time spent: 50 minutes  Author: Jonah Blue, MD 05/10/2023 2:03 PM  For on call review [email or web address].

## 2023-05-10 NOTE — TOC Progression Note (Signed)
Transition of Care [facility]) - Progression Note    Patient Details  Name: Patricia Wu MRN: [id number] Date of Birth: 1956/12/21  Transition of Care [facility]) CM/SW Contact  Elliot Gault, LCSW Phone Number: 05/10/2023, 8:51 AM  Clinical Narrative:     TOC following. Due to pt's insurance, [facility] cannot provide outpatient HD. Referral started to [facility] instead. They will need Hep B Core Antibody. Updated Nephrologist on above.  Anticipating dc early to mid next week. TOC will follow.  Expected Discharge Plan: Home/Self Care Barriers to Discharge: Continued Medical Work up  Expected Discharge Plan and Services In-house Referral: Clinical Social Work     Living arrangements for the past 2 months: Single Family Home                                       Social Determinants of Health (SDOH) Interventions SDOH Screenings   Food Insecurity: No Food Insecurity (05/07/2023)  Housing: Low Risk  (05/07/2023)  Transportation Needs: No Transportation Needs (05/07/2023)  Utilities: Not At Risk (05/07/2023)  Alcohol Screen: Low Risk  (11/28/2022)  Depression (PHQ2-9): Medium Risk (04/03/2023)  Financial Resource Strain: Low Risk  (11/28/2022)  Physical Activity: Insufficiently Active (11/28/2022)  Social Connections: Socially Integrated (11/28/2022)  Stress: No Stress Concern Present (11/28/2022)  Tobacco Use: Low Risk  (05/09/2023)    Readmission Risk Interventions    05/08/2023    1:15 PM  Readmission Risk Prevention Plan  Transportation Screening Complete  Home Care Screening Complete  Medication Review (RN CM) Complete

## 2023-05-10 NOTE — Hospital Course (Signed)
67yo female with h/o afib, stage 5 CKD, and HTN who was sent to the ER on 5/14 from cardiology due to 23 pound weight gain.  She was diagnosed with ESRD with plan for [facility] placement and HD initiation.  TDC was placed on 5/16.

## 2023-05-10 NOTE — Assessment & Plan Note (Signed)
-  Body mass index is 39.17 kg/m..  -Weight loss should be encouraged -Outpatient PCP/bariatric medicine f/u encouraged

## 2023-05-10 NOTE — Consult Note (Signed)
[facility] Customer service manager [facility]) Accountable Care Organization (ACO) [facility] Liaison Note  05/10/2023  [name] 1956-06-06 [id number]  Location: [facility] RN Hospital Liaison screened the patient remotely at [facility].  Insurance:  [facility]   Patricia Wu is a 67 y.o. female who is a Primary Care Patient of Neva Seat, Asencion Partridge, MD [facility]). The patient was screened for readmission hospitalization with noted high risk score for unplanned readmission risk with 2 IP in 6 months.  The patient was assessed for potential [facility]) Care Management service needs for post hospital transition for care coordination. Review of patient's electronic medical record reveals patient was admitted for Stage 5 Chronic Kidney Disease.  Plan: [facility] Liaison will continue to follow progress and disposition to asess for post hospital community care coordination/management needs.  Referral request for community care coordination: anticipate [facility] Transitions of Care Team follow up.   [facility] Care Management/Population Health does not replace or interfere with any arrangements made by the Inpatient Transition of Care team.   For questions contact:   Elliot Cousin, RN, BSN [facility] Liaison [place]   [facility]  Population Health Office Hours MTWF  8:00 am-6:00 pm Off on Thursday [phone] mobile [phone] [Office toll free line][facility] Office Hours are M-F 8:30 - 5 pm 24 hour nurse advise line [phone] Concierge  [email or web address]

## 2023-05-10 NOTE — Progress Notes (Signed)
Progress Note  Patient Name: Patricia Wu Date of Encounter: 05/10/2023  Primary Cardiologist: Marjo Bicker, MD  Subjective   No acute events overnight.  Some symptoms of SOB with walking.  Inpatient Medications    Scheduled Meds:  calcitRIOL  0.5 mcg Oral Daily   Chlorhexidine Gluconate Cloth  6 each Topical Q0600   metoprolol tartrate  75 mg Oral BID   sevelamer carbonate  800 mg Oral TID WC   Continuous Infusions:  PRN Meds: acetaminophen **OR** acetaminophen, alteplase, heparin, ondansetron **OR** ondansetron (ZOFRAN) IV, polyethylene glycol   Vital Signs    Vitals:   05/09/23 2013 05/10/23 0556 05/10/23 0848 05/10/23 0947  BP: (!) 145/93 (!) 142/90 127/79 131/86  Pulse:  84 60 (!) 102  Resp: 20 19    Temp: 97.6 F (36.4 C) 98.1 F (36.7 C)  98.4 F (36.9 C)  TempSrc: Oral Oral  Oral  SpO2: 100% 100%  100%  Weight:  102.3 kg      Intake/Output Summary (Last 24 hours) at 05/10/2023 0948 Last data filed at 05/10/2023 0900 Gross per 24 hour  Intake 480 ml  Output 1500 ml  Net -1020 ml   Filed Weights   05/09/23 1654 05/09/23 1915 05/10/23 0556  Weight: 103.8 kg 102.9 kg 102.3 kg    Telemetry     Personally reviewed, in atrial fibrillation, HR 100s  ECG    Not performed today  Physical Exam   GEN: No acute distress.   Neck: No JVD. Cardiac: RRR, no murmur, rub, or gallop.  Respiratory: Nonlabored. Clear to auscultation bilaterally. GI: Soft, nontender, bowel sounds present. MS: 2-3+ pitting edema; No deformity. Neuro:  Nonfocal. Psych: Alert and oriented x 3. Normal affect.  Labs    Chemistry Recent Labs  Lab 05/07/23 1844 05/08/23 0446 05/09/23 0501  NA 141 141 140  K 4.6 4.6 4.4  CL 109 111 109  CO2 18* 15* 18*  GLUCOSE 114* 77 105*  BUN 99* 99* 103*  CREATININE 11.44* 11.67* 11.83*  CALCIUM 8.2* 8.4* 8.4*  PROT 6.6  --   --   ALBUMIN 2.8*  --   --   AST 28  --   --   ALT 50*  --   --   ALKPHOS 320*  --   --    BILITOT 0.7  --   --   GFRNONAA 3* 3* 3*  ANIONGAP 14 15 13      Hematology Recent Labs  Lab 05/07/23 1844 05/08/23 0446 05/09/23 0501  WBC 8.5 8.4 7.6  RBC 4.15 3.84* 3.88  HGB 11.9* 11.0* 11.2*  HCT 39.6 35.9* 36.2  MCV 95.4 93.5 93.3  MCH 28.7 28.6 28.9  MCHC 30.1 30.6 30.9  RDW 16.5* 16.4* 16.1*  PLT 193 193 179    Cardiac Enzymes Recent Labs  Lab 05/07/23 1844  TROPONINIHS 20*    BNP Recent Labs  Lab 05/07/23 1844  BNP 3,927.0*     DDimerNo results for input(s): "DDIMER" in the last 168 hours.   Radiology    IR Fluoro Guide CV Line Right  Result Date: 05/09/2023 INDICATION: AKI requiring HD. EXAM: TUNNELED CENTRAL VENOUS HEMODIALYSIS CATHETER PLACEMENT WITH ULTRASOUND AND FLUOROSCOPIC GUIDANCE MEDICATIONS: Ancef 2 gm IV . The antibiotic was given in an appropriate time interval prior to skin puncture. 4 mg Zofran IV. ANESTHESIA/SEDATION: Moderate (conscious) sedation was employed during this procedure. A total of Versed 1 mg and Fentanyl 50 mcg was administered intravenously. Moderate Sedation Time:  10 minutes. The patient's level of consciousness and vital signs were monitored continuously by radiology nursing throughout the procedure under my direct supervision. FLUOROSCOPY TIME:  Fluoroscopic dose; 0 mGy COMPLICATIONS: None immediate. PROCEDURE: Informed written consent was obtained from the patient and/or patient's representative after a discussion of the risks, benefits, and alternatives to treatment. Questions regarding the procedure were encouraged and answered. The RIGHT neck and chest were prepped with chlorhexidine in a sterile fashion, and a sterile drape was applied covering the operative field. Maximum barrier sterile technique with sterile gowns and gloves were used for the procedure. A timeout was performed prior to the initiation of the procedure. After creating a small venotomy incision, a micropuncture kit was utilized to access the internal jugular  vein. Real-time ultrasound guidance was utilized for vascular access including the acquisition of a permanent ultrasound image documenting patency of the accessed vessel. The microwire was utilized to measure appropriate catheter length. A stiff Glidewire was advanced to the level of the IVC and the micropuncture sheath was exchanged for a peel-away sheath. A palindrome tunneled hemodialysis catheter measuring 19 cm from tip to cuff was tunneled in a retrograde fashion from the anterior chest wall to the venotomy incision. The catheter was then placed through the peel-away sheath with tips ultimately positioned within the superior aspect of the right atrium. Final catheter positioning was confirmed and documented with a spot radiographic image. The catheter aspirates and flushes normally. The catheter was flushed with appropriate volume heparin dwells. The catheter exit site was secured with a 2-0 Ethilon retention suture. The venotomy incision was closed with Dermabond. Dressings were applied. The patient tolerated the procedure well without immediate post procedural complication. IMPRESSION: Successful placement of 19 cm tip to cuff tunneled hemodialysis catheter via the RIGHT internal jugular vein The tip of the catheter is positioned within the proximal RIGHT atrium. The catheter is ready for immediate use. Roanna Banning, MD [facility] Radiology Electronically Signed   By: Roanna Banning M.D.   On: 05/09/2023 15:17     Assessment & Plan   Patient is a 67 year old F known to have HFmrEF (EF 40-45% by echo in 02/2023), persistent atrial fibrillation (diagnosed in 02/2023), HTN, HLD, asthma and Stage 5 CKD is currently admitted to hospitalist team for the management of acute systolic heart failure exacerbation likely secondary to progression of chronic kidney disease.  # Acute systolic heart failure exacerbation (LVEF 40 to 45% in 3/24) likely tachycardia induced  cardiomyopathy however ischemia will need to be ruled out due to risk factors -Currently on HD via tunneled dialysis catheter insertion since 05/09/2023 -Not on ACE/ARB/ARNI/MRA/SGLT2 inhibitors due to ESRD DD -Outpatient ischemia evaluation   # Persistent A-fib with RVR, HR 100s -Increase metoprolol tartrate from 50 mg to 75 mg twice daily -Coumadin was held for the tunneled dialysis catheter insertion.  Okay to resume when safe from the bleeding standpoint.   I have spent a total of 30 minutes with patient reviewing chart , telemetry, EKGs, labs and examining patient as well as establishing an assessment and plan that was discussed with the patient.  > 50% of time was spent in direct patient care.  '  Signed, Marjo Bicker, MD  05/10/2023, 9:48 AM

## 2023-05-10 NOTE — Progress Notes (Addendum)
Received patient in bed to unit.  Alert and oriented.  Informed consent signed and in chart.   TX duration:2.5 h.  Patient tolerated well.  Transported back to the room  Alert, without acute distress.  Hand-off given to patient's nurse.   Access used: Catheter Access issues: none  Total UF removed: 2 L Medication(s) given: none Post HD VS: 128/81 P 98 R 18 o2 sat 100 % in room air. Post HD weight: 100.3 kg   [facility]

## 2023-05-10 NOTE — Progress Notes (Signed)
[facility] Progress Note    Assessment/ Plan:   CKD5, now ESRD -followed by Dr. Arrie Aran outpatient -started HD given early uremic symptoms and hypervolemia. TDC placed by IR at [facility] 5/16 (will be transported back here). HD #1 5/16 -slow start protocol. HD#2 5/17 and #3 planned for Sat 5/18. Thereafter, can plan for treatment next Tues if still here (or depending on what schedule she gets as an outpatient) -appreciate SW/CM assistance for outpatient HD placement -plan for AVG placement when Afib is better controlled (anticipating that this will be happening as an outpatient) -Avoid nephrotoxic medications including NSAIDs and iodinated intravenous contrast exposure unless the latter is absolutely indicated.  Preferred narcotic agents for pain control are hydromorphone, fentanyl, and methadone. Morphine should not be used. Avoid Baclofen and avoid oral sodium phosphate and magnesium citrate based laxatives / bowel preps. Continue strict Input and Output monitoring. Will monitor the patient closely with you and intervene or adjust therapy as indicated by changes in clinical status/labs    Hypervolemia -s/p lasix 80mg  IV BID, stopped given that she is on HD now. Will UF as tolerated -consider repeat echo, will defer to primary/cardio -follow strict I/O, daily weights+ pre&post HD weights. EDW TBD   Afib w/ RVR -per primary, A/C on hold for now (will defer to primary on resuming this now that her [facility] has been placed). On metoprolol. Rate better   HTN -diuresis as above, UF as tolerated. BP acceptable now   Anemia of CKD -tranfuse prn for hgb <7. Hgb currently acceptable for CKD. Of note, receives retacrit 20,000 units outpatient (last dose 4/3). Iron panel acceptable, can resume ESA if/when indicated   Secondary hyperparathyroidism/CKD-MBD -PTH pending. PO4 10.7. CCa wnl. On calcitriol. Renvela started 5/16   Metabolic acidosis -d/c'ed nahco3 PO now that she's on HD.  Monitor for now  Patient won't be seen over the weekend but we will be available if needed, chart will be reviewed remotely. Please call/page covering nephrologist with any questions/concerns.  Anthony Sar, MD [facility]  Subjective:   Patient seen and examined bedside. S/p RIJ TDC with IR yesterday. Tolerated HD yesterday with net UF 1L. Patient denies any new issues this AM. She reports that she did fine with catheter placement and HD yesterday. Swelling is about the same. Post treatment weight 102.9kg.   Objective:   BP (!) 142/90 (BP Location: Left Wrist)   Pulse 84   Temp 98.1 F (36.7 C) (Oral)   Resp 19   Wt 102.3 kg   SpO2 100%   BMI 39.95 kg/m   Intake/Output Summary (Last 24 hours) at 05/10/2023 0836 Last data filed at 05/09/2023 1930 Gross per 24 hour  Intake 340 ml  Output 1500 ml  Net -1160 ml   Weight change:   Physical Exam: Gen: NAD CVS: irreg irreg Resp: CTA b/l, on room air Abd: obese, soft Ext: 2-3+ pitting edema b/l Les Neuro: awake, alert Dialysis access: RIJ TDC c/d/i  Imaging: IR Fluoro Guide CV Line Right  Result Date: 05/09/2023 INDICATION: AKI requiring HD. EXAM: TUNNELED CENTRAL VENOUS HEMODIALYSIS CATHETER PLACEMENT WITH ULTRASOUND AND FLUOROSCOPIC GUIDANCE MEDICATIONS: Ancef 2 gm IV . The antibiotic was given in an appropriate time interval prior to skin puncture. 4 mg Zofran IV. ANESTHESIA/SEDATION: Moderate (conscious) sedation was employed during this procedure. A total of Versed 1 mg and Fentanyl 50 mcg was administered intravenously. Moderate Sedation Time: 10 minutes. The patient's level of consciousness and vital signs were monitored continuously by  radiology nursing throughout the procedure under my direct supervision. FLUOROSCOPY TIME:  Fluoroscopic dose; 0 mGy COMPLICATIONS: None immediate. PROCEDURE: Informed written consent was obtained from the patient and/or patient's representative after a discussion of the risks,  benefits, and alternatives to treatment. Questions regarding the procedure were encouraged and answered. The RIGHT neck and chest were prepped with chlorhexidine in a sterile fashion, and a sterile drape was applied covering the operative field. Maximum barrier sterile technique with sterile gowns and gloves were used for the procedure. A timeout was performed prior to the initiation of the procedure. After creating a small venotomy incision, a micropuncture kit was utilized to access the internal jugular vein. Real-time ultrasound guidance was utilized for vascular access including the acquisition of a permanent ultrasound image documenting patency of the accessed vessel. The microwire was utilized to measure appropriate catheter length. A stiff Glidewire was advanced to the level of the IVC and the micropuncture sheath was exchanged for a peel-away sheath. A palindrome tunneled hemodialysis catheter measuring 19 cm from tip to cuff was tunneled in a retrograde fashion from the anterior chest wall to the venotomy incision. The catheter was then placed through the peel-away sheath with tips ultimately positioned within the superior aspect of the right atrium. Final catheter positioning was confirmed and documented with a spot radiographic image. The catheter aspirates and flushes normally. The catheter was flushed with appropriate volume heparin dwells. The catheter exit site was secured with a 2-0 Ethilon retention suture. The venotomy incision was closed with Dermabond. Dressings were applied. The patient tolerated the procedure well without immediate post procedural complication. IMPRESSION: Successful placement of 19 cm tip to cuff tunneled hemodialysis catheter via the RIGHT internal jugular vein The tip of the catheter is positioned within the proximal RIGHT atrium. The catheter is ready for immediate use. Roanna Banning, MD [facility] Radiology Electronically  Signed   By: Roanna Banning M.D.   On: 05/09/2023 15:17    Labs: BMET Recent Labs  Lab 05/07/23 1844 05/08/23 0446 05/09/23 0501  NA 141 141 140  K 4.6 4.6 4.4  CL 109 111 109  CO2 18* 15* 18*  GLUCOSE 114* 77 105*  BUN 99* 99* 103*  CREATININE 11.44* 11.67* 11.83*  CALCIUM 8.2* 8.4* 8.4*  PHOS  --   --  10.7*   CBC Recent Labs  Lab 05/07/23 1844 05/08/23 0446 05/09/23 0501  WBC 8.5 8.4 7.6  HGB 11.9* 11.0* 11.2*  HCT 39.6 35.9* 36.2  MCV 95.4 93.5 93.3  PLT 193 193 179    Medications:     calcitRIOL  0.5 mcg Oral Daily   Chlorhexidine Gluconate Cloth  6 each Topical Q0600   metoprolol tartrate  50 mg Oral BID   sevelamer carbonate  800 mg Oral TID WC      Anthony Sar, MD [facility] 05/10/2023, 8:36 AM

## 2023-05-10 NOTE — Progress Notes (Addendum)
ANTICOAGULATION CONSULT NOTE - Initial Consult  Pharmacy Consult for Warfarin Indication: atrial fibrillation  Allergies  Allergen Reactions   Ibuprofen Other (See Comments)    pcp said raises her BP    Vital Signs: Temp: 98.1 F (36.7 C) (05/17 1434) Temp Source: Oral (05/17 1434) BP: 126/108 (05/17 1434) Pulse Rate: 99 (05/17 1434)  Labs: Recent Labs    05/07/23 1844 05/08/23 0446 05/09/23 0501  HGB 11.9* 11.0* 11.2*  HCT 39.6 35.9* 36.2  PLT 193 193 179  LABPROT 19.1*  --   --   INR 1.6*  --   --   CREATININE 11.44* 11.67* 11.83*  TROPONINIHS 20*  --   --     Estimated Creatinine Clearance: 5.3 mL/min (A) (by C-G formula based on SCr of 11.83 mg/dL (H)).  Assessment: 13 yoF hx Afib warfarin held for tunnel dialysis catheter placement on 5/16 with successful initiating of hemodialysis. Ok to resume warfarin this evening. CBC fairly stable.  Home dose is 2.5 mg po daily -will provide a boosted dose today given her last dose was 5/13, and would be reasonable to discharge her on her previous dose.  Goal of Therapy:  INR 2-3 Monitor platelets by anticoagulation protocol: Yes   Plan:  Coumadin 4mg  po x1 INR daily  Caryl Asp, PharmD Clinical Pharmacist 05/10/2023 3:12 PM

## 2023-05-10 NOTE — Care Management Important Message (Signed)
Important Message  Patient Details  Name: Patricia Wu MRN: [id number] Date of Birth: [birth year]   Medicare Important Message Given:  Yes     Corey Harold 05/10/2023, 1:06 PM

## 2023-05-10 NOTE — Progress Notes (Signed)
Patient slept through the night no complaints of pain. This am patient was refusing blood draw, explained need for lab, blood draw attempted by lab with no success. Patient stated she wanted labs to be be done by Dialysis nurse. No labs obtained this am. Continued to monitor.

## 2023-05-10 NOTE — Progress Notes (Signed)
   05/10/23 1700  ReDS Vest / Clip  Station Marker B  Ruler Value 45  ReDS Value Range < 36  ReDS Actual Value 26

## 2023-05-11 DIAGNOSIS — I5043 Acute on chronic combined systolic (congestive) and diastolic (congestive) heart failure: Secondary | ICD-10-CM | POA: Diagnosis not present

## 2023-05-11 DIAGNOSIS — N186 End stage renal disease: Secondary | ICD-10-CM | POA: Diagnosis not present

## 2023-05-11 DIAGNOSIS — I4891 Unspecified atrial fibrillation: Secondary | ICD-10-CM | POA: Diagnosis not present

## 2023-05-11 DIAGNOSIS — Z992 Dependence on renal dialysis: Secondary | ICD-10-CM | POA: Diagnosis not present

## 2023-05-11 LAB — BASIC METABOLIC PANEL
Anion gap: 11 (ref 5–15)
BUN: 56 mg/dL — ABNORMAL HIGH (ref 8–23)
CO2: 23 mmol/L (ref 22–32)
Calcium: 8 mg/dL — ABNORMAL LOW (ref 8.9–10.3)
Chloride: 103 mmol/L (ref 98–111)
Creatinine, Ser: 8.45 mg/dL — ABNORMAL HIGH (ref 0.44–1.00)
GFR, Estimated: 5 mL/min — ABNORMAL LOW (ref >60)
Glucose, Bld: 84 mg/dL (ref 70–99)
Potassium: 3.8 mmol/L (ref 3.5–5.1)
Sodium: 137 mmol/L (ref 135–145)

## 2023-05-11 LAB — CBC
HCT: 35 % — ABNORMAL LOW (ref 36.0–46.0)
Hemoglobin: 10.8 g/dL — ABNORMAL LOW (ref 12.0–15.0)
MCH: 28.7 pg (ref 26.0–34.0)
MCHC: 30.9 g/dL (ref 30.0–36.0)
MCV: 93.1 fL (ref 80.0–100.0)
Platelets: 157 10*3/uL (ref 150–400)
RBC: 3.76 MIL/uL — ABNORMAL LOW (ref 3.87–5.11)
RDW: 15.8 % — ABNORMAL HIGH (ref 11.5–15.5)
WBC: 7.6 10*3/uL (ref 4.0–10.5)
nRBC: 0 % (ref 0.0–0.2)

## 2023-05-11 LAB — PROTIME-INR
INR: 1.2 (ref 0.8–1.2)
Prothrombin Time: 15.6 seconds — ABNORMAL HIGH (ref 11.4–15.2)

## 2023-05-11 MED ORDER — HEPARIN SODIUM (PORCINE) 1000 UNIT/ML IJ SOLN
INTRAMUSCULAR | Status: AC
  Filled 2023-05-11: qty 5

## 2023-05-11 MED ORDER — RENA-VITE PO TABS
1.0000 | ORAL_TABLET | Freq: Every day | ORAL | Status: DC
  Administered 2023-05-11 – 2023-05-13 (×3): 1 via ORAL
  Filled 2023-05-11 (×3): qty 1

## 2023-05-11 MED ORDER — WARFARIN SODIUM 2 MG PO TABS
3.0000 mg | ORAL_TABLET | Freq: Once | ORAL | Status: AC
  Administered 2023-05-11: 3 mg via ORAL
  Filled 2023-05-11: qty 1

## 2023-05-11 NOTE — Progress Notes (Signed)
  HEMODIALYSIS TREATMENT NOTE (HD#3):  Uneventful 3.25 hour treatment completed using RIJ tDC.  Cath dressing was changed; exit site is unremarkable. Goal met: 3.1 liters removed without interruption in UF.  All blood was returned.  Post-dialysis:  05/11/23 1615  Vital Signs  Temp 98 F (36.7 C)  Temp Source Oral  Pulse Rate 99  Pulse Rate Source Monitor  Resp 17  BP (!) 145/99  BP Location Left Arm  BP Method Automatic  Patient Position (if appropriate) Sitting  Oxygen Therapy  SpO2 99 %  O2 Device Room Air  Pain Assessment  Pain Score 0  Dialysis Weight  Weight 96.7 kg  Type of Weight Post-Dialysis  Post Treatment  Dialyzer Clearance Lightly streaked  Duration of HD Treatment -hour(s) 3.25 hour(s)  Hemodialysis Intake (mL) 0 mL  Liters Processed 50.5  Fluid Removed (mL) 3100 mL  Tolerated HD Treatment Yes  Post-Hemodialysis Comments Goal met  Education / Care Plan  Dialysis Education Provided Yes  Documented Education in Care Plan Yes  Outpatient Plan of Care Reviewed and on Chart Yes   Arman Filter, RN AP KDU

## 2023-05-11 NOTE — Progress Notes (Signed)
Initial Nutrition Assessment  DOCUMENTATION CODES:   Obesity unspecified  INTERVENTION:  - Renal diet with fluid restriction.  - Patient not interested in diet education. Added dialysis diet education to discharge instructions. - Add Rena-vit to support micronutrient needs.  - Monitor weight trends.   NUTRITION DIAGNOSIS:   Food and nutrition related knowledge deficit related to limited prior education as evidenced by per patient/family report.  GOAL:   Patient will meet greater than or equal to 90% of their needs  MONITOR:   PO intake, Weight trends, I & O's, Labs  REASON FOR ASSESSMENT:   Consult Assessment of nutrition requirement/status  ASSESSMENT:     67yo female with h/o stage 5 CKD and HTN who was sent to the ER on 5/14 from cardiology due to 23 pound weight gain.   Diagnosed with ESRD. Had [facility] placed and HD initiated.   Spoke with patient via bedside telephone.   She reports being unsure of her UBW but knows she has had weight gain over the past few weeks.  Per EMR, weight without significant changes over the last 6 months until the past 1 month when patient appears to have gained ~30#. Suspect fluid causing weight gain. Weight now trending down after initiation of HD.  Patient endorses typically eating at least two meals a day, lunch and dinner, and occasionally breakfast. Her daughter and husband help prepare meals.   Current appetite decreased as she reports getting a funny taste in her mouth. Thankfully, she is documented to have had 100% of meals the past 3 days.  Reports renal diet is going fairly well. Discussed nutritional recommendations now that starting HD but patient uninterested in diet education at this time. Agreeable to have diet education provided in AVS.   Medications reviewed and include: Renvela, Coumadin  Labs reviewed:  Creatinine 8.45   NUTRITION - FOCUSED PHYSICAL EXAM:  RD working remotely, unable to complete at this  time  Diet Order:   Diet Order             Diet renal with fluid restriction Fluid restriction: 1200 mL Fluid; Room service appropriate? Yes; Fluid consistency: Thin  Diet effective now                   EDUCATION NEEDS:  Not appropriate for education at this time  Skin:  Skin Assessment: Reviewed RN Assessment  Last BM:  5/15  Height:  Ht Readings from Last 1 Encounters:  05/07/23 5\' 3"  (1.6 m)   Weight:  Wt Readings from Last 1 Encounters:  05/11/23 100.1 kg   Ideal Body Weight:  52.27 kg  BMI:  Body mass index is 39.09 kg/m.  Estimated Nutritional Needs:  Kcal:  1950-2100 kcals Protein:  100-120 grams Fluid:     Shelle Iron RD, LDN For contact information, refer to [facility].

## 2023-05-11 NOTE — Progress Notes (Signed)
Progress Note   Patient: Patricia Wu WJX:[id number] DOB: 1956/12/06 DOA: 05/07/2023     4 DOS: the patient was seen and examined on 05/11/2023   Brief hospital course: 67yo female with h/o afib, stage 5 CKD, and HTN who was sent to the ER on 5/14 from cardiology due to 23 pound weight gain.  She was diagnosed with ESRD with plan for [facility] placement and HD initiation.  TDC was placed on 5/16.  HD #1-3 on 5/16-18.  Needs outpatient coordination, ?home soon.  Assessment and Plan: * ESRD needing dialysis (HCC) -Patient with known progressive CKD -Presented with progressively worsening creatinine as well as marked volume overload -Her symptoms appear to all be related to volume overload -Had temporary HD catheter placed for initiation of HD -HD on 5/16-18, currently on day 3 -Will need vascular access mapping for fistula - once afib is better controlled, per nephrology -Will need clipping -Management as per nephrology  -Resume oral sodium bicarbonate -Started on Renvela, calcitriol -Possible dc to home 5/19 if able to find an outpatient chair for HD  Class 2 obesity due to excess calories with body mass index (BMI) of 39.0 to 39.9 in adult -Body mass index is 39.17 kg/m..  -Weight loss should be encouraged -Outpatient PCP/bariatric medicine f/u encouraged   Hypervolemia associated with renal insufficiency -With 3+ pitting bilateral lower extremity edema, progressive chronic renal disease.  BNP elevated at 3927.   -Recent echo 02/2023 EF of 40 to 45%.  -No longer on Lasix, as she will need HD to effect significant change -Strict input output, daily weights, daily BMP  Atrial fibrillation, controlled (HCC) -Rate controlled on lopressor.   -On anticoagulation with warfarin prior to admission, INR 1.6. -Coumadin restarted, per pharmacy  Obesity (BMI 30-39.9) -Body mass index is 39.09 kg/m..  -Weight loss should be encouraged -Outpatient PCP/bariatric medicine/bariatric surgery f/u  encouraged     Subjective: She is feeling some better, continuing to have improving mobility with improvement in LE edema.  She is feeling fatigued and is noticing an aftertaste which leads to poor appetite.  Physical Exam: Vitals:   05/10/23 1220 05/10/23 1434 05/10/23 2045 05/11/23 0528  BP:  (!) 126/108 115/89 (!) 137/100  Pulse:  99 89 (!) 101  Resp:  20 20 17   Temp: 98.4 F (36.9 C) 98.1 F (36.7 C) 98.2 F (36.8 C) 98.3 F (36.8 C)  TempSrc: Oral Oral Oral Oral  SpO2:  100% 99% 95%  Weight:    100.1 kg   General:  Appears calm and comfortable and is in NAD Eyes:  EOMI, normal lids, iris ENT:  grossly normal hearing, lips & tongue, mmm Neck:  no LAD, masses or thyromegaly Cardiovascular:  Irregularly irregular without tachycardia, no m/r/g. 3+ LE edema, less taut than yesterday. R TDC in place. Respiratory:   CTA bilaterally with no wheezes/rales/rhonchi.  Normal respiratory effort. Abdomen:  soft, NT, ND Skin:  no rash or induration seen on limited exam Musculoskeletal:  grossly normal tone BUE/BLE, good ROM, no bony abnormality Psychiatric:  blunted mood and affect, speech fluent and appropriate, AOx3 Neurologic:  CN 2-12 grossly intact, moves all extremities in coordinated fashion   Radiological Exams on Admission: Independently reviewed - see discussion in A/P where applicable  IR Fluoro Guide CV Line Right  Result Date: 05/09/2023 INDICATION: AKI requiring HD. EXAM: TUNNELED CENTRAL VENOUS HEMODIALYSIS CATHETER PLACEMENT WITH ULTRASOUND AND FLUOROSCOPIC GUIDANCE MEDICATIONS: Ancef 2 gm IV . The antibiotic was given in an appropriate time interval  prior to skin puncture. 4 mg Zofran IV. ANESTHESIA/SEDATION: Moderate (conscious) sedation was employed during this procedure. A total of Versed 1 mg and Fentanyl 50 mcg was administered intravenously. Moderate Sedation Time: 10 minutes. The patient's level of consciousness and vital signs were monitored continuously by  radiology nursing throughout the procedure under my direct supervision. FLUOROSCOPY TIME:  Fluoroscopic dose; 0 mGy COMPLICATIONS: None immediate. PROCEDURE: Informed written consent was obtained from the patient and/or patient's representative after a discussion of the risks, benefits, and alternatives to treatment. Questions regarding the procedure were encouraged and answered. The RIGHT neck and chest were prepped with chlorhexidine in a sterile fashion, and a sterile drape was applied covering the operative field. Maximum barrier sterile technique with sterile gowns and gloves were used for the procedure. A timeout was performed prior to the initiation of the procedure. After creating a small venotomy incision, a micropuncture kit was utilized to access the internal jugular vein. Real-time ultrasound guidance was utilized for vascular access including the acquisition of a permanent ultrasound image documenting patency of the accessed vessel. The microwire was utilized to measure appropriate catheter length. A stiff Glidewire was advanced to the level of the IVC and the micropuncture sheath was exchanged for a peel-away sheath. A palindrome tunneled hemodialysis catheter measuring 19 cm from tip to cuff was tunneled in a retrograde fashion from the anterior chest wall to the venotomy incision. The catheter was then placed through the peel-away sheath with tips ultimately positioned within the superior aspect of the right atrium. Final catheter positioning was confirmed and documented with a spot radiographic image. The catheter aspirates and flushes normally. The catheter was flushed with appropriate volume heparin dwells. The catheter exit site was secured with a 2-0 Ethilon retention suture. The venotomy incision was closed with Dermabond. Dressings were applied. The patient tolerated the procedure well without immediate post procedural complication. IMPRESSION: Successful placement of 19 cm tip to cuff  tunneled hemodialysis catheter via the RIGHT internal jugular vein The tip of the catheter is positioned within the proximal RIGHT atrium. The catheter is ready for immediate use. Roanna Banning, MD [facility] Electronically Signed   By: Roanna Banning M.D.   On: 05/09/2023 15:17    EKG: last on 5/14   Pertinent labs:    pending   Family Communication: None present; I left a voice mail for her husband today  Disposition: Status is: Inpatient Remains inpatient appropriate because: new ESRD  Planned Discharge Destination: Home    Time spent: 35 minutes  Author: Jonah Blue, MD 05/11/2023 10:29 AM  For on call review [email or web address].

## 2023-05-11 NOTE — Assessment & Plan Note (Signed)
-  Body mass index is 39.09 kg/m..  -Weight loss should be encouraged -Outpatient PCP/bariatric medicine/bariatric surgery f/u encouraged

## 2023-05-11 NOTE — Progress Notes (Signed)
ANTICOAGULATION CONSULT NOTE - Initial Consult  Pharmacy Consult for Warfarin Indication: atrial fibrillation  Allergies  Allergen Reactions   Ibuprofen Other (See Comments)    pcp said raises her BP    Vital Signs: Temp: 97.9 F (36.6 C) (05/18 1225) Temp Source: Oral (05/18 1225) BP: 136/99 (05/18 1430) Pulse Rate: 103 (05/18 1225)  Labs: Recent Labs    05/09/23 0501 05/10/23 1639 05/11/23 0742  HGB 11.2* 11.8* 10.8*  HCT 36.2 38.4 35.0*  PLT 179 162 157  CREATININE 11.83* 8.12* 8.45*     Estimated Creatinine Clearance: 7.4 mL/min (A) (by C-G formula based on SCr of 8.45 mg/dL (H)).  Assessment: 66 yoF hx Afib to restart warfarin s/p tunnel dialysis catheter placement on 5/16 with successful initiating of hemodialysis. Patient refusing labs this morning, requesting they be drawn during dialysis  INR P  PTA dose: 2.5 mg po daily  Goal of Therapy:  INR 2-3 Monitor platelets by anticoagulation protocol: Yes   Plan:  Coumadin 3mg  po x1 F/u INR  Caryl Asp, PharmD Clinical Pharmacist 05/11/2023 3:03 PM

## 2023-05-11 NOTE — TOC Progression Note (Signed)
Transition of Care [facility]) - Progression Note    Patient Details  Name: Patricia Wu MRN: [id number] Date of Birth: 19-Dec-1956  [facility] CM/SW Contact  Catalina Gravel, [place] Phone Number: 05/11/2023, 3:07 PM  Clinical Narrative:    MD willing to consider DC Sunday if pt has HD scheduled Tuesday. Pt shared Handoff info regarding HBCore antibody pending with MD. CSW contacted [facility]-as well to be sure no scheduled HD for next week.   Pt not in system nor scheduled. DC next week pending further medical testing. TOC to follow.    Expected Discharge Plan: Home/Self Care Barriers to Discharge: Waiting for outpatient dialysis  Expected Discharge Plan and Services In-house Referral: Clinical Social Work     Living arrangements for the past 2 months: Single Family Home                                       Social Determinants of Health (SDOH) Interventions SDOH Screenings   Food Insecurity: No Food Insecurity (05/07/2023)  Housing: Low Risk  (05/07/2023)  Transportation Needs: No Transportation Needs (05/07/2023)  Utilities: Not At Risk (05/07/2023)  Alcohol Screen: Low Risk  (11/28/2022)  Depression (PHQ2-9): Medium Risk (04/03/2023)  Financial Resource Strain: Low Risk  (11/28/2022)  Physical Activity: Insufficiently Active (11/28/2022)  Social Connections: Socially Integrated (11/28/2022)  Stress: No Stress Concern Present (11/28/2022)  Tobacco Use: Low Risk  (05/10/2023)    Readmission Risk Interventions    05/08/2023    1:15 PM  Readmission Risk Prevention Plan  Transportation Screening Complete  Home Care Screening Complete  Medication Review (RN CM) Complete

## 2023-05-11 NOTE — Progress Notes (Signed)
   05/11/23 0800  ReDS Vest / Clip  Station Marker B  Ruler Value 45  ReDS Value Range < 36  ReDS Actual Value 26

## 2023-05-11 NOTE — Discharge Instructions (Signed)
Nutrition for Dialysis  You can enjoy many foods when you have chronic kidney disease. Limiting a few others can also help you be healthy. Follow these tips and those given by  your registered dietitian nutritionist (RDN).  Protein People on dialysis need a lot of protein. Choose 2 to 3 palm-sized portions  of protein foods each day. These include fresh lean beef, lamb, veal, wild  game, and "natural" chicken, fish, pork, seafood, and [place], or 2 to 3 eggs  or egg whites. Beans, edamame, lentils, nut butters, and tofu may be good  options in meatless meals. Limit salty processed meats such as bacon,  brats, deli meats, ham, hot dogs, and sausage.  Dairy Products Limit milk, ice cream, pudding, and yogurt to 4 to 8 oz ( to 1 cup) per  day or 1 slice of natural cheese (such as cheddar, mozzarella, or Swiss).  Or, instead of cow's milk, use unfortified rice, almond, or soy milk. Limit  processed cheeses, such as American cheese, Cheez Whiz, [place],  boxed macaroni and cheese, and other cheese spreads or sauces with  "phos" ingredients.  Breads, Grains, and Starches Choose whole grain breads, grains, and lower salt snacks. Limit processed  foods such as boxed mixes, biscuits, muffins, pancakes, waffles, instant  hot cereals, and ready-to-eat baked goods with "phos" ingredients.  Fluids  If you make little urine, you may need to limit how much you drink. Stay  within the amount recommended by your dialysis team. This includes all  beverages and ice, as well as the fluid in foods such as gelatin, ice cream, popsicles, and soup. Look for beverages without "phos" ingredients.  Fruits Enjoy 2 to 3 servings per day (a serving is  cup or 1 small fruit): Lower Potassium: Apples, Applesauce, Berries, Canned fruit or fruit cups, Clementine, Grapes, Kumquat, Lemon and lime, Mandarin orange, Pear, Pineapple, Plum, Tangerine, Watermelon Higher Potassium: Avocado, Banana, Dried fruit, Guava,  Jackfruit, Kiwi, Mango, Melons: cantaloupe or  Honeydew, Nectarine, Orange, Papaya, Peach, Persimmon, Plantain, Pomegranate, Pomelo  Vegetables Enjoy 2 to 3 servings per day (a serving is 1 cup leafy greens or  cup  fresh, cooked, or canned): Lower Potassium: Asparagus, Bitter melon, Broccoli, Cabbage, Carrots, Cauliflower, Celery, Corn, Cucumber, Eggplant, Green beans, Greens: collard, mustard, or turnip, Kale, Lettuce, Okra, Onion, Peppers, Radish, Spaghetti squash, Sweet or snap peas, Turnip Higher Potassium: Artichoke, Brussels sprouts, Cassava root, Celery root (celeriac), Cooked chard, Kohlrabi, Parsnips, Potato, Pumpkin, Rutabaga, Squash (acorn, butternut, hubbard, most winter varieties), Sweet potatoes or yams (batata), Taro root, Tomatoes or tomato sauce, Zucchini  Salt and Sodium Choose foods with less than 200 mg sodium per serving. Packaged meals  should have less than 600 mg per serving. Do not add salt to foods.  Instead, use herbs and spices such as garlic, onion or garlic powder, basil,  oregano, paprika, pepper, or thyme. You can also add flavor with lemon,  lime, vinegars, or a salt-free seasoning blend like Mrs. Dash.  Added Phosphorus Limit foods and drinks with added phosphorus (any ingredients with  "phos," such as calcium phosphate or phosphoric acid). Fast food,  convenience or gas station foods, restaurant meals, and other processed,  boxed, or prepared foods are often high in added phosphorus   Copyright  2023 Academy of Nutrition and Dietetics. This handout may be reproduced for education purposes

## 2023-05-12 DIAGNOSIS — Z992 Dependence on renal dialysis: Secondary | ICD-10-CM | POA: Diagnosis not present

## 2023-05-12 DIAGNOSIS — I5043 Acute on chronic combined systolic (congestive) and diastolic (congestive) heart failure: Secondary | ICD-10-CM | POA: Diagnosis not present

## 2023-05-12 DIAGNOSIS — N186 End stage renal disease: Secondary | ICD-10-CM | POA: Diagnosis not present

## 2023-05-12 DIAGNOSIS — I4891 Unspecified atrial fibrillation: Secondary | ICD-10-CM | POA: Diagnosis not present

## 2023-05-12 LAB — BASIC METABOLIC PANEL
Anion gap: 11 (ref 5–15)
BUN: 34 mg/dL — ABNORMAL HIGH (ref 8–23)
CO2: 23 mmol/L (ref 22–32)
Calcium: 8.2 mg/dL — ABNORMAL LOW (ref 8.9–10.3)
Chloride: 102 mmol/L (ref 98–111)
Creatinine, Ser: 6.02 mg/dL — ABNORMAL HIGH (ref 0.44–1.00)
GFR, Estimated: 7 mL/min — ABNORMAL LOW (ref >60)
Glucose, Bld: 87 mg/dL (ref 70–99)
Potassium: 3.7 mmol/L (ref 3.5–5.1)
Sodium: 136 mmol/L (ref 135–145)

## 2023-05-12 LAB — PROTIME-INR
INR: 1.3 — ABNORMAL HIGH (ref 0.8–1.2)
Prothrombin Time: 16.4 seconds — ABNORMAL HIGH (ref 11.4–15.2)

## 2023-05-12 LAB — CBC
HCT: 36.8 % (ref 36.0–46.0)
Hemoglobin: 11.2 g/dL — ABNORMAL LOW (ref 12.0–15.0)
MCH: 28.2 pg (ref 26.0–34.0)
MCHC: 30.4 g/dL (ref 30.0–36.0)
MCV: 92.7 fL (ref 80.0–100.0)
Platelets: 142 10*3/uL — ABNORMAL LOW (ref 150–400)
RBC: 3.97 MIL/uL (ref 3.87–5.11)
RDW: 15.2 % (ref 11.5–15.5)
WBC: 7.9 10*3/uL (ref 4.0–10.5)
nRBC: 0 % (ref 0.0–0.2)

## 2023-05-12 MED ORDER — WARFARIN SODIUM 2 MG PO TABS
4.0000 mg | ORAL_TABLET | Freq: Once | ORAL | Status: AC
  Administered 2023-05-12: 4 mg via ORAL
  Filled 2023-05-12: qty 2

## 2023-05-12 NOTE — Progress Notes (Signed)
ANTICOAGULATION CONSULT NOTE - Initial Consult  Pharmacy Consult for Warfarin Indication: atrial fibrillation  Allergies  Allergen Reactions   Ibuprofen Other (See Comments)    pcp said raises her BP    Vital Signs: Temp: 97.8 F (36.6 C) (05/19 0404) Temp Source: Oral (05/19 0404) BP: 149/86 (05/19 0404) Pulse Rate: 85 (05/19 0404)  Labs: Recent Labs    05/10/23 1639 05/11/23 0500 05/11/23 0742 05/12/23 0502  HGB 11.8*  --  10.8* 11.2*  HCT 38.4  --  35.0* 36.8  PLT 162  --  157 142*  LABPROT  --  15.6*  --  16.4*  INR  --  1.2  --  1.3*  CREATININE 8.12*  --  8.45* 6.02*     Estimated Creatinine Clearance: 10.2 mL/min (A) (by C-G formula based on SCr of 6.02 mg/dL (H)).  Assessment: 66 yoF hx Afib to restart warfarin s/p tunnel dialysis catheter placement on 5/16 with successful initiating of hemodialysis. INR subtherapeutic but trending up slowly. CBC stable  PTA dose: 2.5 mg po daily  Goal of Therapy:  INR 2-3 Monitor platelets by anticoagulation protocol: Yes   Plan:  Coumadin 4 mg po x1 F/u INR  Caryl Asp, PharmD Clinical Pharmacist 05/12/2023 7:58 AM

## 2023-05-12 NOTE — Progress Notes (Signed)
Progress Note   Patient: Patricia Wu [id number] DOB: Dec 30, 1955 DOA: 05/07/2023     5 DOS: the patient was seen and examined on 05/12/2023   Brief hospital course: 67yo female with h/o afib, stage 5 CKD, and HTN who was sent to the ER on 5/14 from cardiology due to 23 pound weight gain.  She was diagnosed with ESRD with plan for [facility] placement and HD initiation.  TDC was placed on 5/16.  HD #1-3 on 5/16-18.  Needs outpatient coordination, ?home soon.  Assessment and Plan: * ESRD needing dialysis (HCC) -Patient with known progressive CKD -Presented with progressively worsening creatinine as well as marked volume overload -Her symptoms appear to all be related to volume overload -Had temporary HD catheter placed for initiation of HD -HD on 5/16-18, has completed day 3 of HD -Will need vascular access mapping for fistula - once afib is better controlled, per nephrology -Will need clipping -Management as per nephrology  -Resume oral sodium bicarbonate -Started on Renvela, calcitriol -Possible dc to home 5/20 if able to find an outpatient chair for HD - awaiting Hep B core antibody level, which is pending and must be back before she can be assigned to a center and time  Class 2 obesity due to excess calories with body mass index (BMI) of 39.0 to 39.9 in adult -Body mass index is 39.17 kg/m..  -Weight loss should be encouraged -Outpatient PCP/bariatric medicine f/u encouraged   Hypervolemia associated with renal insufficiency -With 3+ pitting bilateral lower extremity edema, progressive chronic renal disease.  BNP elevated at 3927.   -Recent echo 02/2023 EF of 40 to 45%.  -No longer on Lasix, as she will need HD to effect significant change -Strict input output, daily weights, daily BMP  Atrial fibrillation, controlled (HCC) -Rate controlled on lopressor.   -On anticoagulation with warfarin prior to admission, INR 1.6. -Coumadin restarted, per pharmacy  Obesity (BMI  30-39.9) -Body mass index is 39.09 kg/m..  -Weight loss should be encouraged -Outpatient PCP/bariatric medicine/bariatric surgery f/u encouraged         Subjective: No new concerns, a little more interactive today.  Physical Exam: Vitals:   05/11/23 1615 05/11/23 2024 05/12/23 0404 05/12/23 0500  BP: (!) 145/99 (!) 127/100 (!) 149/86   Pulse: 99 83 85   Resp: 17 18 18    Temp: 98 F (36.7 C) 97.9 F (36.6 C) 97.8 F (36.6 C)   TempSrc: Oral Oral Oral   SpO2: 99% 93% 100%   Weight: 96.7 kg   96.7 kg   General:  Appears calm and comfortable and is in NAD Eyes:  EOMI, normal lids, iris ENT:  grossly normal hearing, lips & tongue, mmm Neck:  no LAD, masses or thyromegaly Cardiovascular:  Irregularly irregular without tachycardia, no m/r/g. 2-3+ LE edema, improving. R TDC in place. Respiratory:   CTA bilaterally with no wheezes/rales/rhonchi.  Normal respiratory effort. Abdomen:  soft, NT, ND Skin:  no rash or induration seen on limited exam Musculoskeletal:  grossly normal tone BUE/BLE, good ROM, no bony abnormality Psychiatric:  blunted but a little brighter mood and affect, speech fluent and appropriate, AOx3 Neurologic:  CN 2-12 grossly intact, moves all extremities in coordinated fashion   Radiological Exams on Admission: Independently reviewed - see discussion in A/P where applicable  No results found.  EKG: last on 5/14   Pertinent labs:    BUN 34/Creatinine 6.02/GFR 7 WBC 7.9 Hgb 11.2 INR 1.3   Family Communication: None present; I spoke with  her husband by telephone  Disposition: Status is: Inpatient Remains inpatient appropriate because: needs outpatient HD plan  Planned Discharge Destination: Home    Time spent: 35 minutes  Author: Jonah Blue, MD 05/12/2023 10:09 AM  For on call review [email or web address].

## 2023-05-12 NOTE — Progress Notes (Signed)
   05/12/23 0800  ReDS Vest / Clip  Station Marker B  Ruler Value 45  ReDS Value Range < 36  ReDS Actual Value 20

## 2023-05-13 DIAGNOSIS — I5023 Acute on chronic systolic (congestive) heart failure: Secondary | ICD-10-CM

## 2023-05-13 DIAGNOSIS — N186 End stage renal disease: Secondary | ICD-10-CM | POA: Diagnosis not present

## 2023-05-13 DIAGNOSIS — N179 Acute kidney failure, unspecified: Secondary | ICD-10-CM | POA: Diagnosis not present

## 2023-05-13 DIAGNOSIS — I120 Hypertensive chronic kidney disease with stage 5 chronic kidney disease or end stage renal disease: Secondary | ICD-10-CM | POA: Diagnosis not present

## 2023-05-13 DIAGNOSIS — N185 Chronic kidney disease, stage 5: Secondary | ICD-10-CM | POA: Diagnosis not present

## 2023-05-13 DIAGNOSIS — N2581 Secondary hyperparathyroidism of renal origin: Secondary | ICD-10-CM | POA: Diagnosis not present

## 2023-05-13 DIAGNOSIS — D649 Anemia, unspecified: Secondary | ICD-10-CM | POA: Diagnosis not present

## 2023-05-13 DIAGNOSIS — Z992 Dependence on renal dialysis: Secondary | ICD-10-CM

## 2023-05-13 DIAGNOSIS — E8779 Other fluid overload: Secondary | ICD-10-CM | POA: Diagnosis not present

## 2023-05-13 DIAGNOSIS — I4891 Unspecified atrial fibrillation: Secondary | ICD-10-CM | POA: Diagnosis not present

## 2023-05-13 LAB — CBC
HCT: 35.7 % — ABNORMAL LOW (ref 36.0–46.0)
Hemoglobin: 11 g/dL — ABNORMAL LOW (ref 12.0–15.0)
MCH: 28.5 pg (ref 26.0–34.0)
MCHC: 30.8 g/dL (ref 30.0–36.0)
MCV: 92.5 fL (ref 80.0–100.0)
Platelets: 152 10*3/uL (ref 150–400)
RBC: 3.86 MIL/uL — ABNORMAL LOW (ref 3.87–5.11)
RDW: 15.2 % (ref 11.5–15.5)
WBC: 7.6 10*3/uL (ref 4.0–10.5)
nRBC: 0 % (ref 0.0–0.2)

## 2023-05-13 LAB — PROTIME-INR
INR: 1.4 — ABNORMAL HIGH (ref 0.8–1.2)
Prothrombin Time: 17.2 seconds — ABNORMAL HIGH (ref 11.4–15.2)

## 2023-05-13 LAB — BASIC METABOLIC PANEL
Anion gap: 11 (ref 5–15)
BUN: 38 mg/dL — ABNORMAL HIGH (ref 8–23)
CO2: 22 mmol/L (ref 22–32)
Calcium: 8.4 mg/dL — ABNORMAL LOW (ref 8.9–10.3)
Chloride: 103 mmol/L (ref 98–111)
Creatinine, Ser: 7.24 mg/dL — ABNORMAL HIGH (ref 0.44–1.00)
GFR, Estimated: 6 mL/min — ABNORMAL LOW (ref >60)
Glucose, Bld: 95 mg/dL (ref 70–99)
Potassium: 3.7 mmol/L (ref 3.5–5.1)
Sodium: 136 mmol/L (ref 135–145)

## 2023-05-13 LAB — HEPATITIS B CORE ANTIBODY, TOTAL: Hep B Core Total Ab: NONREACTIVE

## 2023-05-13 MED ORDER — METOPROLOL TARTRATE 25 MG PO TABS
25.0000 mg | ORAL_TABLET | Freq: Once | ORAL | Status: AC
  Administered 2023-05-13: 25 mg via ORAL
  Filled 2023-05-13: qty 1

## 2023-05-13 MED ORDER — WARFARIN SODIUM 2 MG PO TABS
4.0000 mg | ORAL_TABLET | Freq: Once | ORAL | Status: AC
  Administered 2023-05-13: 4 mg via ORAL
  Filled 2023-05-13: qty 2

## 2023-05-13 MED ORDER — SEVELAMER CARBONATE 800 MG PO TABS
1600.0000 mg | ORAL_TABLET | Freq: Three times a day (TID) | ORAL | Status: DC
  Administered 2023-05-13 – 2023-05-14 (×5): 1600 mg via ORAL
  Filled 2023-05-13 (×5): qty 2

## 2023-05-13 MED ORDER — CHLORHEXIDINE GLUCONATE CLOTH 2 % EX PADS: 6.0000 | MEDICATED_PAD | Freq: Every day | CUTANEOUS | Status: DC

## 2023-05-13 MED ORDER — LOSARTAN POTASSIUM 25 MG PO TABS
12.5000 mg | ORAL_TABLET | Freq: Every day | ORAL | Status: DC
  Administered 2023-05-13 – 2023-05-14 (×2): 12.5 mg via ORAL
  Filled 2023-05-13 (×2): qty 1

## 2023-05-13 MED ORDER — METOPROLOL TARTRATE 50 MG PO TABS
100.0000 mg | ORAL_TABLET | Freq: Two times a day (BID) | ORAL | Status: DC
  Administered 2023-05-13 – 2023-05-14 (×2): 100 mg via ORAL
  Filled 2023-05-13 (×2): qty 2

## 2023-05-13 NOTE — Progress Notes (Signed)
Mobility Specialist Progress Note:    05/13/23 1018  Mobility  Activity Ambulated with assistance in hallway  Level of Assistance Standby assist, set-up cues, supervision of patient - no hands on  Assistive Device Front wheel walker  Distance Ambulated (ft) 200 ft  Range of Motion/Exercises Active;All extremities  Activity Response Tolerated well  Mobility Referral Yes  $Mobility charge 1 Mobility  Mobility Specialist Start Time (ACUTE ONLY) 1018  Mobility Specialist Stop Time (ACUTE ONLY) 1026  Mobility Specialist Time Calculation (min) (ACUTE ONLY) 8 min   Pt eager for mobility session. Tolerated well, asx throughout, SpO2 96% on RA throughout ambulation. Returned pt to room, daughter at bedside, all needs met.   Feliciana Rossetti Mobility Specialist Please contact via Special educational needs teacher or  Rehab office at [phone]

## 2023-05-13 NOTE — Progress Notes (Signed)
PROGRESS NOTE    Patricia Wu  [id number] DOB: 25-Sep-1956 DOA: 05/07/2023 PCP: Shade Flood, MD   Brief Narrative:  67yo female with h/o afib, stage 5 CKD, and HTN who was sent to the ER on 5/14 from cardiology due to 23 pound weight gain.  She was diagnosed with ESRD with plan for [facility] placement and HD initiation.  TDC was placed on 5/16.  HD #1-3 on 5/16-18.  Needs outpatient coordination, ?home soon.    Assessment & Plan:   Principal Problem:   ESRD needing dialysis (HCC) Active Problems:   Obesity (BMI 30-39.9)   Atrial fibrillation, controlled (HCC)   Hypervolemia associated with renal insufficiency   Class 2 obesity due to excess calories with body mass index (BMI) of 39.0 to 39.9 in adult  Assessment and Plan:  ESRD needing dialysis [facility]) -Patient with known progressive CKD -Presented with progressively worsening creatinine as well as marked volume overload -Her symptoms appear to all be related to volume overload -Had temporary HD catheter placed for initiation of HD -HD on 5/16-18, has completed day 3 of HD -Will need vascular access mapping for fistula - once afib is better controlled, per nephrology -Will need clipping -Management as per nephrology  -Resume oral sodium bicarbonate -Started on Renvela, calcitriol -Possible dc to home 5/20 if able to find an outpatient chair for HD - awaiting Hep B core antibody level, which is pending and must be back before she can be assigned to a center and time -Hep B studies have to be repeated at this point   Class 2 obesity due to excess calories with body mass index (BMI) of 39.0 to 39.9 in adult -Body mass index is 39.17 kg/m..  -Weight loss should be encouraged -Outpatient PCP/bariatric medicine f/u encouraged    Hypervolemia associated with renal insufficiency -With 3+ pitting bilateral lower extremity edema, progressive chronic renal disease.  BNP elevated at 3927.   -Recent echo 02/2023 EF of 40 to 45%.   -No longer on Lasix, as she will need HD to effect significant change -Strict input output, daily weights, daily BMP   Atrial fibrillation, elevated rates -Lopressor to 100 mg twice daily per cardiology today, continue to monitor -On anticoagulation with warfarin prior to admission, INR 1.6. -Coumadin restarted, per pharmacy   Obesity (BMI 30-39.9) -Body mass index is 39.09 kg/m..  -Weight loss should be encouraged -Outpatient PCP/bariatric medicine/bariatric surgery f/u encouraged   DVT prophylaxis: Coumadin Code Status: Full Family Communication: Daughter at bedside 5/20 Disposition Plan:  Status is: Inpatient Remains inpatient appropriate because: Need for inpatient hemodialysis   Consultants:  Nephrology Cardiology  Procedures:  None  Antimicrobials:  None   Subjective: Patient seen and evaluated today with no new acute complaints or concerns. No acute concerns or events noted overnight.  Objective: Vitals:   05/12/23 2016 05/12/23 2324 05/13/23 0504 05/13/23 0700  BP: (!) 164/110 (!) 153/136 134/83 (!) 120/91  Pulse: 92 100 61 (!) 110  Resp: 16  16 16   Temp: 98.1 F (36.7 C)  98.4 F (36.9 C) 98.2 F (36.8 C)  TempSrc: Oral  Oral Oral  SpO2: 100%  100%   Weight:   96.6 kg   Height:   5\' 3"  (1.6 m)     Intake/Output Summary (Last 24 hours) at 05/13/2023 1040 Last data filed at 05/12/2023 1700 Gross per 24 hour  Intake 480 ml  Output --  Net 480 ml   Filed Weights   05/11/23 1615 05/12/23 0500  05/13/23 0504  Weight: 96.7 kg 96.7 kg 96.6 kg    Examination:  General exam: Appears calm and comfortable  Respiratory system: Clear to auscultation. Respiratory effort normal. Cardiovascular system: S1 & S2 heard, RRR.  Gastrointestinal system: Abdomen is soft Central nervous system: Alert and awake Extremities: Bilateral lower extremity edema Skin: No significant lesions noted Psychiatry: Flat affect.    Data Reviewed: I have personally reviewed  following labs and imaging studies  CBC: Recent Labs  Lab 05/09/23 0501 05/10/23 1639 05/11/23 0742 05/12/23 0502 05/13/23 0611  WBC 7.6 7.6 7.6 7.9 7.6  HGB 11.2* 11.8* 10.8* 11.2* 11.0*  HCT 36.2 38.4 35.0* 36.8 35.7*  MCV 93.3 93.2 93.1 92.7 92.5  PLT 179 162 157 142* 152   Basic Metabolic Panel: Recent Labs  Lab 05/09/23 0501 05/10/23 1639 05/11/23 0742 05/12/23 0502 05/13/23 0611  NA 140 136 137 136 136  K 4.4 4.0 3.8 3.7 3.7  CL 109 101 103 102 103  CO2 18* 22 23 23 22   GLUCOSE 105* 115* 84 87 95  BUN 103* 56* 56* 34* 38*  CREATININE 11.83* 8.12* 8.45* 6.02* 7.24*  CALCIUM 8.4* 8.0* 8.0* 8.2* 8.4*  MG 2.2 1.8  --   --   --   PHOS 10.7*  --   --   --   --    GFR: Estimated Creatinine Clearance: 8.5 mL/min (A) (by C-G formula based on SCr of 7.24 mg/dL (H)). Liver Function Tests: Recent Labs  Lab 05/07/23 1844  AST 28  ALT 50*  ALKPHOS 320*  BILITOT 0.7  PROT 6.6  ALBUMIN 2.8*   No results for input(s): "LIPASE", "AMYLASE" in the last 168 hours. No results for input(s): "AMMONIA" in the last 168 hours. Coagulation Profile: Recent Labs  Lab 05/07/23 1844 05/11/23 0500 05/12/23 0502 05/13/23 0611  INR 1.6* 1.2 1.3* 1.4*   Cardiac Enzymes: No results for input(s): "CKTOTAL", "CKMB", "CKMBINDEX", "TROPONINI" in the last 168 hours. BNP (last 3 results) No results for input(s): "PROBNP" in the last 8760 hours. HbA1C: No results for input(s): "HGBA1C" in the last 72 hours. CBG: No results for input(s): "GLUCAP" in the last 168 hours. Lipid Profile: No results for input(s): "CHOL", "HDL", "LDLCALC", "TRIG", "CHOLHDL", "LDLDIRECT" in the last 72 hours. Thyroid Function Tests: No results for input(s): "TSH", "T4TOTAL", "FREET4", "T3FREE", "THYROIDAB" in the last 72 hours. Anemia Panel: No results for input(s): "VITAMINB12", "FOLATE", "FERRITIN", "TIBC", "IRON", "RETICCTPCT" in the last 72 hours. Sepsis Labs: No results for input(s): "PROCALCITON",  "LATICACIDVEN" in the last 168 hours.  No results found for this or any previous visit (from the past 240 hour(s)).       Radiology Studies: No results found.      Scheduled Meds:  calcitRIOL  0.5 mcg Oral Daily   Chlorhexidine Gluconate Cloth  6 each Topical Q0600   losartan  12.5 mg Oral Daily   metoprolol tartrate  100 mg Oral BID   multivitamin  1 tablet Oral QHS   sevelamer carbonate  1,600 mg Oral TID WC   warfarin  4 mg Oral ONCE-1600   Warfarin - Pharmacist Dosing Inpatient   Does not apply q1600     LOS: 6 days    Time spent: 35 minutes    Shaterria Sager Hoover Brunette, DO [facility]  If 7PM-7AM, please contact night-coverage [email or web address] 05/13/2023, 10:40 AM

## 2023-05-13 NOTE — Progress Notes (Signed)
[facility] Progress Note    Assessment/ Plan:   CKD5, now ESRD -followed by Dr. Arrie Aran outpatient -started HD given uremic symptoms and hypervolemia. TDC placed by IR at [facility] 5/16 - HD #1 5/16 -HD#2 5/17 #3 on 5/18. Thereafter, can plan for treatment next 5/21 if still here (or depending on what schedule she gets as an outpatient) -appreciate SW/CM assistance for outpatient HD placement-  if we get OP HD assignment today she could be discharged  -plan for [facility] placement when Afib is better controlled (anticipating as an outpatient)    Hypervolemia -s/p lasix 80mg  IV BID, stopped given that she is on HD now. Will UF as tolerated -follow strict I/O, daily weights+ pre&post HD weights. EDW TBD Albumin 2.8-  is likelly third spacing-  weight coming down with HD   Afib w/ RVR -per primary, A/C on hold for now (will defer to primary on resuming this now that her [facility] has been placed). On metoprolol. Rate better   HTN -diuresis as above, UF as tolerated. BP acceptable now only on lopressor   Anemia of CKD Hgb currently acceptable for CKD. Of note, receives retacrit 20,000 units outpatient (last dose 4/3). Iron panel acceptable, can resume ESA if/when indicated   Secondary hyperparathyroidism/CKD-MBD -PTH 464. PO4 10.7. CCa wnl. On calcitriol. Renvela started 5/16   Metabolic acidosis -d/c'ed nahco3 PO now that she's on HD. Monitor for now   Subjective:   Patient seen and examined bedside. Tolerating HD. Patient denies any new issues this AM. Swelling is slightly improved. Marland Kitchen Post treatment weight on 5/18  96.7   Objective:   BP (!) 120/91 (BP Location: Left Arm)   Pulse (!) 110   Temp 98.2 F (36.8 C) (Oral)   Resp 16   Ht 5\' 3"  (1.6 m)   Wt 96.6 kg   SpO2 100%   BMI 37.72 kg/m   Intake/Output Summary (Last 24 hours) at 05/13/2023 0800 Last data filed at 05/12/2023 1700 Gross per 24 hour  Intake 720 ml  Output --  Net 720 ml   Weight change: -3.2  kg  Physical Exam: Gen: NAD CVS: irreg irreg Resp: CTA b/l, on room air Abd: obese, soft Ext: 2+ pitting edema b/l Les Neuro: awake, alert Dialysis access: RIJ TDC c/d/i  Imaging: No results found.  Labs: BMET Recent Labs  Lab 05/07/23 1844 05/08/23 0446 05/09/23 0501 05/10/23 1639 05/11/23 0742 05/12/23 0502 05/13/23 0611  NA 141 141 140 136 137 136 136  K 4.6 4.6 4.4 4.0 3.8 3.7 3.7  CL 109 111 109 101 103 102 103  CO2 18* 15* 18* 22 23 23 22   GLUCOSE 114* 77 105* 115* 84 87 95  BUN 99* 99* 103* 56* 56* 34* 38*  CREATININE 11.44* 11.67* 11.83* 8.12* 8.45* 6.02* 7.24*  CALCIUM 8.2* 8.4* 8.4* 8.0* 8.0* 8.2* 8.4*  PHOS  --   --  10.7*  --   --   --   --    CBC Recent Labs  Lab 05/10/23 1639 05/11/23 0742 05/12/23 0502 05/13/23 0611  WBC 7.6 7.6 7.9 7.6  HGB 11.8* 10.8* 11.2* 11.0*  HCT 38.4 35.0* 36.8 35.7*  MCV 93.2 93.1 92.7 92.5  PLT 162 157 142* 152    Medications:     calcitRIOL  0.5 mcg Oral Daily   Chlorhexidine Gluconate Cloth  6 each Topical Q0600   metoprolol tartrate  75 mg Oral BID   multivitamin  1 tablet Oral QHS  sevelamer carbonate  800 mg Oral TID WC   warfarin  4 mg Oral ONCE-1600   Warfarin - Pharmacist Dosing Inpatient   Does not apply q1600     Cecille Aver  [facility] 05/13/2023, 8:00 AM

## 2023-05-13 NOTE — Progress Notes (Signed)
ANTICOAGULATION CONSULT NOTE - Initial Consult  Pharmacy Consult for Warfarin Indication: atrial fibrillation  Allergies  Allergen Reactions   Ibuprofen Other (See Comments)    pcp said raises her BP    Vital Signs: Temp: 98.2 F (36.8 C) (05/20 0700) Temp Source: Oral (05/20 0700) BP: 120/91 (05/20 0700) Pulse Rate: 110 (05/20 0700)  Labs: Recent Labs    05/11/23 0500 05/11/23 0742 05/12/23 0502 05/13/23 0611  HGB  --  10.8* 11.2* 11.0*  HCT  --  35.0* 36.8 35.7*  PLT  --  157 142* 152  LABPROT 15.6*  --  16.4* 17.2*  INR 1.2  --  1.3* 1.4*  CREATININE  --  8.45* 6.02* 7.24*     Estimated Creatinine Clearance: 8.5 mL/min (A) (by C-G formula based on SCr of 7.24 mg/dL (H)).  Assessment: 66 yoF hx Afib to restart warfarin s/p tunnel dialysis catheter placement on 5/16 with successful initiating of hemodialysis. INR subtherapeutic but trending up slowly. CBC stable  PTA dose: 2.5 mg po daily  Goal of Therapy:  INR 2-3 Monitor platelets by anticoagulation protocol: Yes   Plan:  Coumadin 4 mg po x1 Resume home dose at discharge  Caryl Asp, PharmD Clinical Pharmacist 05/13/2023 8:00 AM

## 2023-05-13 NOTE — Progress Notes (Signed)
Rounding Note    Patient Name: Patricia Wu Date of Encounter: 05/13/2023  [facility] Cardiologist: Marjo Bicker, MD   Subjective   No complaints  Inpatient Medications    Scheduled Meds:  calcitRIOL  0.5 mcg Oral Daily   Chlorhexidine Gluconate Cloth  6 each Topical Q0600   metoprolol tartrate  75 mg Oral BID   multivitamin  1 tablet Oral QHS   sevelamer carbonate  1,600 mg Oral TID WC   warfarin  4 mg Oral ONCE-1600   Warfarin - Pharmacist Dosing Inpatient   Does not apply q1600   Continuous Infusions:  PRN Meds: acetaminophen **OR** acetaminophen, alteplase, heparin, ondansetron **OR** ondansetron (ZOFRAN) IV, polyethylene glycol   Vital Signs    Vitals:   05/12/23 2016 05/12/23 2324 05/13/23 0504 05/13/23 0700  BP: (!) 164/110 (!) 153/136 134/83 (!) 120/91  Pulse: 92 100 61 (!) 110  Resp: 16  16 16   Temp: 98.1 F (36.7 C)  98.4 F (36.9 C) 98.2 F (36.8 C)  TempSrc: Oral  Oral Oral  SpO2: 100%  100%   Weight:   96.6 kg   Height:   5\' 3"  (1.6 m)     Intake/Output Summary (Last 24 hours) at 05/13/2023 0911 Last data filed at 05/12/2023 1700 Gross per 24 hour  Intake 480 ml  Output --  Net 480 ml      05/13/2023    5:04 AM 05/12/2023    5:00 AM 05/11/2023    4:15 PM  Last 3 Weights  Weight (lbs) 212 lb 15.4 oz 213 lb 3 oz 213 lb 3 oz  Weight (kg) 96.6 kg 96.7 kg 96.7 kg      Telemetry    Afib elevated rates - Personally Reviewed  ECG    N/a - Personally Reviewed  Physical Exam   GEN: No acute distress.   Neck: No JVD Cardiac: irreg, tachy Respiratory: Clear to auscultation bilaterally. GI: Soft, nontender, non-distended  MS: 2+ bilateral LE edema Neuro:  Nonfocal  Psych: Normal affect   Labs    High Sensitivity Troponin:   Recent Labs  Lab 05/07/23 1844  TROPONINIHS 20*     Chemistry Recent Labs  Lab 05/07/23 1844 05/08/23 0446 05/09/23 0501 05/10/23 1639 05/11/23 0742 05/12/23 0502 05/13/23 0611   NA 141   < > 140 136 137 136 136  K 4.6   < > 4.4 4.0 3.8 3.7 3.7  CL 109   < > 109 101 103 102 103  CO2 18*   < > 18* 22 23 23 22   GLUCOSE 114*   < > 105* 115* 84 87 95  BUN 99*   < > 103* 56* 56* 34* 38*  CREATININE 11.44*   < > 11.83* 8.12* 8.45* 6.02* 7.24*  CALCIUM 8.2*   < > 8.4* 8.0* 8.0* 8.2* 8.4*  MG  --   --  2.2 1.8  --   --   --   PROT 6.6  --   --   --   --   --   --   ALBUMIN 2.8*  --   --   --   --   --   --   AST 28  --   --   --   --   --   --   ALT 50*  --   --   --   --   --   --   ALKPHOS 320*  --   --   --   --   --   --  BILITOT 0.7  --   --   --   --   --   --   GFRNONAA 3*   < > 3* 5* 5* 7* 6*  ANIONGAP 14   < > 13 13 11 11 11    < > = values in this interval not displayed.    Lipids No results for input(s): "CHOL", "TRIG", "HDL", "LABVLDL", "LDLCALC", "CHOLHDL" in the last 168 hours.  Hematology Recent Labs  Lab 05/11/23 0742 05/12/23 0502 05/13/23 0611  WBC 7.6 7.9 7.6  RBC 3.76* 3.97 3.86*  HGB 10.8* 11.2* 11.0*  HCT 35.0* 36.8 35.7*  MCV 93.1 92.7 92.5  MCH 28.7 28.2 28.5  MCHC 30.9 30.4 30.8  RDW 15.8* 15.2 15.2  PLT 157 142* 152   Thyroid No results for input(s): "TSH", "FREET4" in the last 168 hours.  BNP Recent Labs  Lab 05/07/23 1844  BNP 3,927.0*    DDimer No results for input(s): "DDIMER" in the last 168 hours.   Radiology    No results found.  Cardiac Studies     Patient Profile     Patient is a 67 year old F known to have HFmrEF (EF 40-45% by echo in 02/2023), persistent atrial fibrillation (diagnosed in 02/2023), HTN, HLD, asthma and Stage 5 CKD is currently admitted to hospitalist team for the management of acute systolic heart failure exacerbation likely secondary to progression of chronic kidney disease.   Assessment & Plan    1.Acute on chronic HFrEF - 04/2018 echo LVE 55-60% - 02/2023 LVE 40-45% -CXR no acute process, BNP 3927 - presented volume overloaded in setting of progressing CKD, now ESRD started on HD  this admission  - volume removal per HD and neprhology - medical therapy with lopressor 75mg  bid, increasing to 100mg  bid. Consolidate to toprol once rates controlled.  - now committed to HD. Start losartan 12.5mg  daily, if tolerates ARB overtime could transition to entresto and possibly add MRA. SGLT2i would be contraindicated in ESRD.   - LV systolic dysfunction diagnosed same time as new diagnosis of afib with RVR, could be tachy mediated CM. Could repeat echo 3-6 months with rate control and medical therapy. No strong indication for urgent ischemic evaluation given mild dysfunction and likely alternative etiology.    2.Afib - diagnosed 02/2023. Was in sinus brady 03/26/23. Afib with RVR consistently this admit - DOACs were expensive, she is on coumadin - coumadin held for dialysis access placement - rates elevated, she is on lopressor 75mg  bid. Increase to 100mg  bid. If rate controlled on this dosing consolidate to toprol 200mg  in setting of HFrEF   3. ESRD - initiated HD this admission given uremic symptoms and hypervolemia.     For questions or updates, please contact [facility] Please consult [email or web address] for contact info under        Signed, Dina Rich, MD  05/13/2023, 9:11 AM

## 2023-05-13 NOTE — Progress Notes (Signed)
Attempted reds clip x3. Error message "low volume" all 3 times.

## 2023-05-13 NOTE — TOC Progression Note (Signed)
Transition of Care [facility]) - Progression Note    Patient Details  Name: Patricia Wu MRN: [id number] Date of Birth: 09-07-1956  [facility] CM/SW Contact  Karn Cassis, [place] Phone Number: 05/13/2023, 11:40 AM  Clinical Narrative:  [place] awaiting hep b core antibody. Per dialysis RN, will need to be redrawn as current ones are still pending. LCSW discussed with pt that referral was sent to [facility] as [facility] unable to take pt due to insurance. Pt agreeable.      Expected Discharge Plan: Home/Self Care Barriers to Discharge: Waiting for outpatient dialysis  Expected Discharge Plan and Services In-house Referral: Clinical Social Work     Living arrangements for the past 2 months: Single Family Home                                       Social Determinants of Health (SDOH) Interventions SDOH Screenings   Food Insecurity: No Food Insecurity (05/07/2023)  Housing: Low Risk  (05/07/2023)  Transportation Needs: No Transportation Needs (05/07/2023)  Utilities: Not At Risk (05/07/2023)  Alcohol Screen: Low Risk  (11/28/2022)  Depression (PHQ2-9): Medium Risk (04/03/2023)  Financial Resource Strain: Low Risk  (11/28/2022)  Physical Activity: Insufficiently Active (11/28/2022)  Social Connections: Socially Integrated (11/28/2022)  Stress: No Stress Concern Present (11/28/2022)  Tobacco Use: Low Risk  (05/10/2023)    Readmission Risk Interventions    05/08/2023    1:15 PM  Readmission Risk Prevention Plan  Transportation Screening Complete  Home Care Screening Complete  Medication Review (RN CM) Complete

## 2023-05-14 DIAGNOSIS — Z992 Dependence on renal dialysis: Secondary | ICD-10-CM | POA: Diagnosis not present

## 2023-05-14 DIAGNOSIS — N186 End stage renal disease: Secondary | ICD-10-CM | POA: Diagnosis not present

## 2023-05-14 DIAGNOSIS — I5023 Acute on chronic systolic (congestive) heart failure: Secondary | ICD-10-CM | POA: Diagnosis not present

## 2023-05-14 DIAGNOSIS — I4819 Other persistent atrial fibrillation: Secondary | ICD-10-CM | POA: Diagnosis not present

## 2023-05-14 LAB — PROTIME-INR
INR: 1.4 — ABNORMAL HIGH (ref 0.8–1.2)
Prothrombin Time: 17.1 seconds — ABNORMAL HIGH (ref 11.4–15.2)

## 2023-05-14 MED ORDER — METOPROLOL TARTRATE 25 MG PO TABS: 125.0000 mg | ORAL_TABLET | Freq: Two times a day (BID) | ORAL | 0 refills | Status: DC

## 2023-05-14 MED ORDER — WARFARIN SODIUM 5 MG PO TABS
5.0000 mg | ORAL_TABLET | Freq: Once | ORAL | Status: AC
  Administered 2023-05-14: 5 mg via ORAL
  Filled 2023-05-14: qty 1

## 2023-05-14 MED ORDER — METOPROLOL TARTRATE 50 MG PO TABS: 125.0000 mg | ORAL_TABLET | Freq: Two times a day (BID) | ORAL | Status: DC

## 2023-05-14 MED ORDER — RENA-VITE PO TABS: 1.0000 | ORAL_TABLET | Freq: Every day | ORAL | 0 refills | Status: AC

## 2023-05-14 MED ORDER — SEVELAMER CARBONATE 800 MG PO TABS: 1600.0000 mg | ORAL_TABLET | Freq: Three times a day (TID) | ORAL | 0 refills | Status: AC

## 2023-05-14 NOTE — Progress Notes (Signed)
[facility] Progress Note    Assessment/ Plan:   CKD5, now ESRD -followed by Dr. Arrie Aran outpatient -started HD given uremic symptoms and hypervolemia. TDC placed by IR at [facility] 5/16 - HD #1 5/16 -HD#2 5/17 #3 on 5/18.  planning for treatment next today -  then could be discharged-  if will be TTS next on Thursday - If MWF could probably wait til Friday  -appreciate SW/CM assistance for outpatient HD placement-  if we get OP HD assignment today she could be discharged  -plan for AVG placement when Afib is better controlled (anticipating as an outpatient)    Hypervolemia -s/p lasix 80mg  IV BID, stopped given that she is on HD now. UF as tolerated -follow strict I/O, daily weights+ pre&post HD weights. EDW TBD Albumin 2.8-  is likelly third spacing- but weight coming down with HD   Afib w/ RVR -per primary, A/C on hold for now (will defer to primary on resuming this now that her [facility] has been placed). On metoprolol. Rate better   HTN -diuresis as above, UF as tolerated. BP acceptable now only on lopressor   Anemia of CKD Hgb acceptable for CKD. Of note, receives retacrit 20,000 units outpatient (last dose 4/3). Iron panel acceptable, can resume ESA if/when indicated   Secondary hyperparathyroidism/CKD-MBD -PTH 464. PO4 10.7. CCa wnl. On calcitriol. Renvela started 5/16   Metabolic acidosis -d/c'ed nahco3 PO now that she's on HD.    Subjective:   Hep B core redraw is negative this AM-  still awaiting OP spot so seems like will need dialysis today pre discharge.  There was some talk that Dr. Wolfgang Phoenix was going to come and see the patient this AM ? I am confident will have OP spot established today    Objective:   BP 107/73   Pulse 94   Temp 98.1 F (36.7 C)   Resp 18   Ht 5\' 3"  (1.6 m)   Wt 95.4 kg   SpO2 99%   BMI 37.26 kg/m   Intake/Output Summary (Last 24 hours) at 05/14/2023 0749 Last data filed at 05/14/2023 0500 Gross per 24 hour  Intake 580 ml   Output --  Net 580 ml   Weight change: -1.2 kg  Physical Exam: Gen: NAD CVS: irreg irreg Resp: CTA b/l, on room air Abd: obese, soft Ext: 2+ pitting edema b/l Les Neuro: awake, alert Dialysis access: RIJ TDC c/d/i  Imaging: No results found.  Labs: BMET Recent Labs  Lab 05/07/23 1844 05/08/23 0446 05/09/23 0501 05/10/23 1639 05/11/23 0742 05/12/23 0502 05/13/23 0611  NA 141 141 140 136 137 136 136  K 4.6 4.6 4.4 4.0 3.8 3.7 3.7  CL 109 111 109 101 103 102 103  CO2 18* 15* 18* 22 23 23 22   GLUCOSE 114* 77 105* 115* 84 87 95  BUN 99* 99* 103* 56* 56* 34* 38*  CREATININE 11.44* 11.67* 11.83* 8.12* 8.45* 6.02* 7.24*  CALCIUM 8.2* 8.4* 8.4* 8.0* 8.0* 8.2* 8.4*  PHOS  --   --  10.7*  --   --   --   --    CBC Recent Labs  Lab 05/10/23 1639 05/11/23 0742 05/12/23 0502 05/13/23 0611  WBC 7.6 7.6 7.9 7.6  HGB 11.8* 10.8* 11.2* 11.0*  HCT 38.4 35.0* 36.8 35.7*  MCV 93.2 93.1 92.7 92.5  PLT 162 157 142* 152    Medications:     calcitRIOL  0.5 mcg Oral Daily   Chlorhexidine Gluconate Cloth  6 each Topical Q0600   losartan  12.5 mg Oral Daily   metoprolol tartrate  100 mg Oral BID   multivitamin  1 tablet Oral QHS   sevelamer carbonate  1,600 mg Oral TID WC   Warfarin - Pharmacist Dosing Inpatient   Does not apply q1600     Cecille Aver  [facility] 05/14/2023, 7:49 AM

## 2023-05-14 NOTE — TOC Progression Note (Signed)
Transition of Care [facility]) - Progression Note    Patient Details  Name: Patricia Wu MRN: [id number] Date of Birth: 1956-08-04  Transition of Care [facility]) CM/SW Contact  Karn Cassis, [place] Phone Number: 05/14/2023, 2:43 PM  Clinical Narrative: Pt's chair time at [facility] has been confirmed for MWF 10:00. Pt will need to arrive by 9:15 on 05/15/23 to complete paperwork. LCSW discussed above with daughter, Chase Picket and added to AVS.        Expected Discharge Plan: Home/Self Care Barriers to Discharge: Barriers Resolved  Expected Discharge Plan and Services In-house Referral: Clinical Social Work     Living arrangements for the past 2 months: Single Family Home                                       Social Determinants of Health (SDOH) Interventions SDOH Screenings   Food Insecurity: No Food Insecurity (05/07/2023)  Housing: Low Risk  (05/07/2023)  Transportation Needs: No Transportation Needs (05/07/2023)  Utilities: Not At Risk (05/07/2023)  Alcohol Screen: Low Risk  (11/28/2022)  Depression (PHQ2-9): Medium Risk (04/03/2023)  Financial Resource Strain: Low Risk  (11/28/2022)  Physical Activity: Insufficiently Active (11/28/2022)  Social Connections: Socially Integrated (11/28/2022)  Stress: No Stress Concern Present (11/28/2022)  Tobacco Use: Low Risk  (05/10/2023)    Readmission Risk Interventions    05/08/2023    1:15 PM  Readmission Risk Prevention Plan  Transportation Screening Complete  Home Care Screening Complete  Medication Review (RN CM) Complete

## 2023-05-14 NOTE — Progress Notes (Signed)
ANTICOAGULATION CONSULT NOTE  Pharmacy Consult for Warfarin Indication: atrial fibrillation  Allergies  Allergen Reactions   Ibuprofen Other (See Comments)    pcp said raises her BP    Vital Signs: Temp: 98 F (36.7 C) (05/21 0935) BP: 140/102 (05/21 1100) Pulse Rate: 90 (05/21 1100)  Labs: Recent Labs    05/12/23 0502 05/13/23 0611  HGB 11.2* 11.0*  HCT 36.8 35.7*  PLT 142* 152  LABPROT 16.4* 17.2*  INR 1.3* 1.4*  CREATININE 6.02* 7.24*     Estimated Creatinine Clearance: 8.4 mL/min (A) (by C-G formula based on SCr of 7.24 mg/dL (H)).  Assessment: Patricia Wu hx Afib to restart warfarin s/p tunnel dialysis catheter placement on 5/16 with successful initiating of hemodialysis. INR subtherapeutic at 1.4. No cbc done. No bleeding issues noted.   PTA dose: 2.5 mg po daily  Goal of Therapy:  INR 2-3 Monitor platelets by anticoagulation protocol: Yes   Plan:  Warfarin 5mg  tonight Daily INR for now  Sheppard Coil PharmD., BCPS Clinical Pharmacist 05/14/2023 11:44 AM

## 2023-05-14 NOTE — Care Management Important Message (Signed)
Important Message  Patient Details  Name: ZYVA ZINS MRN: [id number] Date of Birth: 16-Dec-1956   Medicare Important Message Given:  Yes     Corey Harold 05/14/2023, 9:57 AM

## 2023-05-14 NOTE — Progress Notes (Addendum)
Rounding Note    Patient Name: Patricia Wu Date of Encounter: 05/14/2023  [facility] Cardiologist: Marjo Bicker, MD   Subjective   Afib rates 100-120.The patient has dialysis today. Hold BP meds prior to HD. She denies chest pain or SOB. Some LLE on exam.   Inpatient Medications    Scheduled Meds:  calcitRIOL  0.5 mcg Oral Daily   Chlorhexidine Gluconate Cloth  6 each Topical Q0600   losartan  12.5 mg Oral Daily   metoprolol tartrate  100 mg Oral BID   multivitamin  1 tablet Oral QHS   sevelamer carbonate  1,600 mg Oral TID WC   Warfarin - Pharmacist Dosing Inpatient   Does not apply q1600   Continuous Infusions:  PRN Meds: acetaminophen **OR** acetaminophen, alteplase, heparin, ondansetron **OR** ondansetron (ZOFRAN) IV, polyethylene glycol   Vital Signs    Vitals:   05/13/23 2028 05/13/23 2122 05/14/23 0500 05/14/23 0525  BP: (!) 148/97   107/73  Pulse: (!) 59 70  94  Resp: 20   18  Temp: 98.2 F (36.8 C)   98.1 F (36.7 C)  TempSrc: Oral     SpO2: 100%   99%  Weight:   95.4 kg   Height:        Intake/Output Summary (Last 24 hours) at 05/14/2023 0814 Last data filed at 05/14/2023 0500 Gross per 24 hour  Intake 580 ml  Output --  Net 580 ml      05/14/2023    5:00 AM 05/13/2023    5:04 AM 05/12/2023    5:00 AM  Last 3 Weights  Weight (lbs) 210 lb 5.1 oz 212 lb 15.4 oz 213 lb 3 oz  Weight (kg) 95.4 kg 96.6 kg 96.7 kg      Telemetry    Afib HR around 100 - Personally Reviewed  ECG    No new - Personally Reviewed  Physical Exam   GEN: No acute distress.   Neck: No JVD Cardiac: Irreg IRreg, no murmurs, rubs, or gallops.  Respiratory: Clear to auscultation bilaterally. GI: Soft, nontender, non-distended  MS: 1+ lower leg edema; No deformity. Neuro:  Nonfocal  Psych: Normal affect   Labs    High Sensitivity Troponin:   Recent Labs  Lab 05/07/23 1844  TROPONINIHS 20*     Chemistry Recent Labs  Lab 05/07/23 1844  05/08/23 0446 05/09/23 0501 05/10/23 1639 05/11/23 0742 05/12/23 0502 05/13/23 0611  NA 141   < > 140 136 137 136 136  K 4.6   < > 4.4 4.0 3.8 3.7 3.7  CL 109   < > 109 101 103 102 103  CO2 18*   < > 18* 22 23 23 22   GLUCOSE 114*   < > 105* 115* 84 87 95  BUN 99*   < > 103* 56* 56* 34* 38*  CREATININE 11.44*   < > 11.83* 8.12* 8.45* 6.02* 7.24*  CALCIUM 8.2*   < > 8.4* 8.0* 8.0* 8.2* 8.4*  MG  --   --  2.2 1.8  --   --   --   PROT 6.6  --   --   --   --   --   --   ALBUMIN 2.8*  --   --   --   --   --   --   AST 28  --   --   --   --   --   --   ALT  50*  --   --   --   --   --   --   ALKPHOS 320*  --   --   --   --   --   --   BILITOT 0.7  --   --   --   --   --   --   GFRNONAA 3*   < > 3* 5* 5* 7* 6*  ANIONGAP 14   < > 13 13 11 11 11    < > = values in this interval not displayed.    Lipids No results for input(s): "CHOL", "TRIG", "HDL", "LABVLDL", "LDLCALC", "CHOLHDL" in the last 168 hours.  Hematology Recent Labs  Lab 05/11/23 0742 05/12/23 0502 05/13/23 0611  WBC 7.6 7.9 7.6  RBC 3.76* 3.97 3.86*  HGB 10.8* 11.2* 11.0*  HCT 35.0* 36.8 35.7*  MCV 93.1 92.7 92.5  MCH 28.7 28.2 28.5  MCHC 30.9 30.4 30.8  RDW 15.8* 15.2 15.2  PLT 157 142* 152   Thyroid No results for input(s): "TSH", "FREET4" in the last 168 hours.  BNP Recent Labs  Lab 05/07/23 1844  BNP 3,927.0*    DDimer No results for input(s): "DDIMER" in the last 168 hours.   Radiology    No results found.  Cardiac Studies   Echo 02/2023 1. Left ventricular ejection fraction, by estimation, is 40 to 45%. The  left ventricle has mildly decreased function. The left ventricle  demonstrates global hypokinesis. There is moderate left ventricular  hypertrophy. Left ventricular diastolic function   could not be evaluated.   2. Right ventricular systolic function is normal. The right ventricular  size is normal. There is normal pulmonary artery systolic pressure. The  estimated right ventricular systolic  pressure is 28.0 mmHg.   3. Right atrial size was moderately dilated.   4. The mitral valve is normal in structure. Mild mitral valve  regurgitation. No evidence of mitral stenosis.   5. The aortic valve is tricuspid. There is mild calcification of the  aortic valve. Aortic valve regurgitation is not visualized. No aortic  stenosis is present.   6. The inferior vena cava is normal in size with greater than 50%  respiratory variability, suggesting right atrial pressure of 3 mmHg.   Comparison(s): No prior Echocardiogram.   Patient Profile     67 y.o. female known to have HFmrEF (EF 40-45% by echo in 02/2023), persistent atrial fibrillation (diagnosed in 02/2023), HTN, HLD, asthma and Stage 5 CKD is currently admitted to hospitalist team for the management of acute systolic heart failure exacerbation likely secondary to progression of chronic kidney disease.   Assessment & Plan   Acute on chronic HFrEF - presented with volume overload in the setting of progressing CKD, now ESRD started this admission - CXR no acute process, BNP 3927 - Echo 04/2018 showed LVEF 55-60% - Echo 02/2023 showed LVEF 40-45% - volume status per HD and nephrology - continue lopressor and Losartan. BP soft at times - unclear etiology of HFrEF, could be tachy-mediated given Afib RVR diagnosis. Would repeat echo in 2-3 months to evaluate EF. If EF is still low may need ischemic evaluation  Persistent Afib - diagnosed 02/2023  - was in sinus brady 03/26/23.  - patient has been in Afib RVR consistently this admission - coumadin per pharmacy - lopressor increased to 100mg  BID - will discuss plan to restore NSR with MD, may need cardioversion  ESRD - HD initiated this admission  For questions or updates,  please contact [facility] Please consult [email or web address] for contact info under     Signed, Cadence David Stall, PA-C  05/14/2023, 8:14 AM     Patient seen and examined    I agree with findings as noted by  C Furth above  Pt currently in dialysis session  Breathing comfortably Neck  JV P appears normal Lungs are relatively clear  Ext with tr to 1+ LE edema      ATRIAL FIBRILLATIOn The presented  to hospital with atrial fib in 03/05/23  Placed on Eliquis and  b blocker Discharged  On 03/26/23 she was  in clinic she was in sinus bradycardia    Now she is  back in afib    Rates improved from admit  The pt will need better rate control or, if pursue rhythm control, an antiarrhythmic.   Amiodarone is probably only option given other medical problems  She has been subtherapeutic on coumadin  Would need 4 wks before considering   dosing to convert. I do not think TEE/DCCV would be beneficial as she will probably revert to afib  Dietrich Pates MD

## 2023-05-14 NOTE — Progress Notes (Signed)
Pt discharged via WC to POV. 

## 2023-05-14 NOTE — Discharge Summary (Signed)
Physician Discharge Summary  Patricia Wu ZOX:[id number] DOB: [birth year] DOA: 05/07/2023  PCP: Shade Flood, MD  Admit date: 05/07/2023  Discharge date: 05/14/2023  Admitted From:Home  Disposition:  Home  Recommendations for Outpatient Follow-up:  Follow up with PCP in 1-2 weeks Follow-up with nephrology outpatient with hemodialysis Follow-up with cardiology on 6/4 and continue on medications as prescribed below  Home Health: None  Equipment/Devices: None  Discharge Condition:Stable  CODE STATUS: Full  Diet recommendation: Renal diet  Brief/Interim Summary: 67yo female with h/o afib, stage 5 CKD, and HTN who was sent to the ER on 5/14 from cardiology due to 23 pound weight gain. She was diagnosed with ESRD and had temporary dialysis catheter placement on 5/16 and she was started on hemodialysis.  She was noted to have some atrial fibrillation with RVR and cardiology had adjusted her medications for better rate control.  She is currently tolerating hemodialysis well and outpatient dialysis has been arranged and she is in stable condition for discharge.  No other acute events or concerns otherwise noted.  Discharge Diagnoses:  Principal Problem:   ESRD needing dialysis (HCC) Active Problems:   Obesity (BMI 30-39.9)   Atrial fibrillation, controlled (HCC)   Hypervolemia associated with renal insufficiency   Class 2 obesity due to excess calories with body mass index (BMI) of 39.0 to 39.9 in adult  Principal discharge diagnosis: New ESRD and atrial fibrillation with RVR  Discharge Instructions  Discharge Instructions     Diet - low sodium heart healthy   Complete by: As directed    Increase activity slowly   Complete by: As directed    No wound care   Complete by: As directed       Allergies as of 05/14/2023       Reactions   Ibuprofen Other (See Comments)   pcp said raises her BP        Medication List     STOP taking these medications     furosemide 40 MG tablet Commonly known as: LASIX   sodium bicarbonate 650 MG tablet       TAKE these medications    acetaminophen 325 MG tablet Commonly known as: TYLENOL Take 325-650 mg by mouth every 6 (six) hours as needed (pain.).   albuterol 108 (90 Base) MCG/ACT inhaler Commonly known as: VENTOLIN HFA Inhale 1-2 puffs into the lungs every 6 (six) hours as needed for wheezing or shortness of breath.   atorvastatin 10 MG tablet Commonly known as: LIPITOR TAKE 1 TABLET(10 MG) BY MOUTH DAILY What changed:  how much to take how to take this when to take this additional instructions   calcitRIOL 0.25 MCG capsule Commonly known as: ROCALTROL Take 0.5 mcg by mouth daily.   fluticasone 50 MCG/ACT nasal spray Commonly known as: FLONASE Place 2 sprays into both nostrils daily.   metoprolol tartrate 25 MG tablet Commonly known as: LOPRESSOR Take 5 tablets (125 mg total) by mouth 2 (two) times daily. What changed:  medication strength how much to take   multivitamin Tabs tablet Take 1 tablet by mouth at bedtime.   sevelamer carbonate 800 MG tablet Commonly known as: RENVELA Take 2 tablets (1,600 mg total) by mouth 3 (three) times daily with meals.   warfarin 2.5 MG tablet Commonly known as: COUMADIN Take as directed. If you are unsure how to take this medication, talk to your nurse or doctor. Original instructions: Take 1 tablet daily or as directed by [facility]  Follow-up Information     Sharlene Dory, NP Follow up on 05/28/2023.   Specialty: Cardiology Why: [facility]iology [facility] Follow-up on 05/28/2023 at 2:30 PM. Contact information: [address] [place] [address] [phone]         Dialysis, [facility] Follow up.   Why: Monday, Wednesday, Friday chair time is 10:00. Please arrive by 9:15 on 05/15/23 for paperwork prior to treatment. Contact information: [address] [place] [facility] [address] [phone]          Shade Flood, MD. Schedule an appointment as soon as possible for a visit in 1 week(s).   Specialties: Family Medicine, Sports Medicine Contact information: [address] [place] [address] [phone]                Allergies  Allergen Reactions   Ibuprofen Other (See Comments)    pcp said raises her BP    Consultations: Cardiology Nephrology   Procedures/Studies: IR Fluoro Guide CV Line Right  Result Date: 05/09/2023 INDICATION: AKI requiring HD. EXAM: TUNNELED CENTRAL VENOUS HEMODIALYSIS CATHETER PLACEMENT WITH ULTRASOUND AND FLUOROSCOPIC GUIDANCE MEDICATIONS: Ancef 2 gm IV . The antibiotic was given in an appropriate time interval prior to skin puncture. 4 mg Zofran IV. ANESTHESIA/SEDATION: Moderate (conscious) sedation was employed during this procedure. A total of Versed 1 mg and Fentanyl 50 mcg was administered intravenously. Moderate Sedation Time: 10 minutes. The patient's level of consciousness and vital signs were monitored continuously by radiology nursing throughout the procedure under my direct supervision. FLUOROSCOPY TIME:  Fluoroscopic dose; 0 mGy COMPLICATIONS: None immediate. PROCEDURE: Informed written consent was obtained from the patient and/or patient's representative after a discussion of the risks, benefits, and alternatives to treatment. Questions regarding the procedure were encouraged and answered. The RIGHT neck and chest were prepped with chlorhexidine in a sterile fashion, and a sterile drape was applied covering the operative field. Maximum barrier sterile technique with sterile gowns and gloves were used for the procedure. A timeout was performed prior to the initiation of the procedure. After creating a small venotomy incision, a micropuncture kit was utilized to access the internal jugular vein. Real-time ultrasound guidance was utilized for vascular access including the acquisition of a permanent ultrasound image documenting patency of the  accessed vessel. The microwire was utilized to measure appropriate catheter length. A stiff Glidewire was advanced to the level of the IVC and the micropuncture sheath was exchanged for a peel-away sheath. A palindrome tunneled hemodialysis catheter measuring 19 cm from tip to cuff was tunneled in a retrograde fashion from the anterior chest wall to the venotomy incision. The catheter was then placed through the peel-away sheath with tips ultimately positioned within the superior aspect of the right atrium. Final catheter positioning was confirmed and documented with a spot radiographic image. The catheter aspirates and flushes normally. The catheter was flushed with appropriate volume heparin dwells. The catheter exit site was secured with a 2-0 Ethilon retention suture. The venotomy incision was closed with Dermabond. Dressings were applied. The patient tolerated the procedure well without immediate post procedural complication. IMPRESSION: Successful placement of 19 cm tip to cuff tunneled hemodialysis catheter via the RIGHT internal jugular vein The tip of the catheter is positioned within the proximal RIGHT atrium. The catheter is ready for immediate use. Roanna Banning, MD [facility] Radiology Electronically Signed   By: Roanna Banning M.D.   On: 05/09/2023 15:17   DG Chest Port 1 View  Result Date:  05/07/2023 CLINICAL DATA:  Shortness of breath. EXAM: PORTABLE CHEST 1 VIEW COMPARISON:  03/04/2023 FINDINGS: Stable moderate cardiomegaly and pulmonary interstitial prominence. No evidence of acute infiltrate or pleural effusion. IMPRESSION: Stable cardiomegaly and pulmonary interstitial prominence. No acute findings. Electronically Signed   By: Danae Orleans M.D.   On: 05/07/2023 18:53     Discharge Exam: Vitals:   05/14/23 1409 05/14/23 1411  BP: (!) 148/84   Pulse: (!) 59 94  Resp: 16   Temp: 97.8 F (36.6 C)   SpO2: 100%    Vitals:   05/14/23 1310  05/14/23 1315 05/14/23 1409 05/14/23 1411  BP: (!) 130/94  (!) 148/84   Pulse: 90  (!) 59 94  Resp: 20  16   Temp: 98 F (36.7 C)  97.8 F (36.6 C)   TempSrc: Oral  Oral   SpO2: 100%  100%   Weight:  92.4 kg    Height:        General: Pt is alert, awake, not in acute distress Cardiovascular: RRR, S1/S2 +, no rubs, no gallops Respiratory: CTA bilaterally, no wheezing, no rhonchi Abdominal: Soft, NT, ND, bowel sounds + Extremities: no edema, no cyanosis    The results of significant diagnostics from this hospitalization (including imaging, microbiology, ancillary and laboratory) are listed below for reference.     Microbiology: No results found for this or any previous visit (from the past 240 hour(s)).   Labs: BNP (last 3 results) Recent Labs    05/07/23 1844  BNP 3,927.0*   Basic Metabolic Panel: Recent Labs  Lab 05/09/23 0501 05/10/23 1639 05/11/23 0742 05/12/23 0502 05/13/23 0611  NA 140 136 137 136 136  K 4.4 4.0 3.8 3.7 3.7  CL 109 101 103 102 103  CO2 18* 22 23 23 22   GLUCOSE 105* 115* 84 87 95  BUN 103* 56* 56* 34* 38*  CREATININE 11.83* 8.12* 8.45* 6.02* 7.24*  CALCIUM 8.4* 8.0* 8.0* 8.2* 8.4*  MG 2.2 1.8  --   --   --   PHOS 10.7*  --   --   --   --    Liver Function Tests: Recent Labs  Lab 05/07/23 1844  AST 28  ALT 50*  ALKPHOS 320*  BILITOT 0.7  PROT 6.6  ALBUMIN 2.8*   No results for input(s): "LIPASE", "AMYLASE" in the last 168 hours. No results for input(s): "AMMONIA" in the last 168 hours. CBC: Recent Labs  Lab 05/09/23 0501 05/10/23 1639 05/11/23 0742 05/12/23 0502 05/13/23 0611  WBC 7.6 7.6 7.6 7.9 7.6  HGB 11.2* 11.8* 10.8* 11.2* 11.0*  HCT 36.2 38.4 35.0* 36.8 35.7*  MCV 93.3 93.2 93.1 92.7 92.5  PLT 179 162 157 142* 152   Cardiac Enzymes: No results for input(s): "CKTOTAL", "CKMB", "CKMBINDEX", "TROPONINI" in the last 168 hours. BNP: Invalid input(s): "POCBNP" CBG: No results for input(s): "GLUCAP" in the last  168 hours. D-Dimer No results for input(s): "DDIMER" in the last 72 hours. Hgb A1c No results for input(s): "HGBA1C" in the last 72 hours. Lipid Profile No results for input(s): "CHOL", "HDL", "LDLCALC", "TRIG", "CHOLHDL", "LDLDIRECT" in the last 72 hours. Thyroid function studies No results for input(s): "TSH", "T4TOTAL", "T3FREE", "THYROIDAB" in the last 72 hours.  Invalid input(s): "FREET3" Anemia work up No results for input(s): "VITAMINB12", "FOLATE", "FERRITIN", "TIBC", "IRON", "RETICCTPCT" in the last 72 hours. Urinalysis    Component Value Date/Time   COLORURINE YELLOW 08/02/2009 1702   APPEARANCEUR CLEAR 08/02/2009 1702   LABSPEC  1.010 08/02/2009 1702   PHURINE 7.0 08/02/2009 1702   GLUCOSEU NEGATIVE 08/02/2009 1702   HGBUR SMALL (A) 08/02/2009 1702   BILIRUBINUR NEGATIVE 08/02/2009 1702   KETONESUR NEGATIVE 08/02/2009 1702   PROTEINUR 30 (A) 08/02/2009 1702   UROBILINOGEN 0.2 08/02/2009 1702   NITRITE NEGATIVE 08/02/2009 1702   LEUKOCYTESUR NEGATIVE 08/02/2009 1702   Sepsis Labs Recent Labs  Lab 05/10/23 1639 05/11/23 0742 05/12/23 0502 05/13/23 0611  WBC 7.6 7.6 7.9 7.6   Microbiology No results found for this or any previous visit (from the past 240 hour(s)).   Time coordinating discharge: 35 minutes  SIGNED:   Erick Blinks, DO [facility] 05/14/2023, 4:19 PM  If 7PM-7AM, please contact night-coverage [email or web address]

## 2023-05-14 NOTE — Progress Notes (Signed)
Received patient in bed to unit.  Alert and oriented.  Informed consent signed and in chart.   TX duration:3.5 h. Pt goal met.  Patient tolerated well.  Transported back to the room  Alert, without acute distress.  Hand-off given to patient's nurse.   Access used: catheter. Access issues: none  Total UF removed: 3 L Medication(s) given: none Post HD VS: 130/94 P 98.R 20. O2 sat 100 % in room air. Post HD weight: 92.4 kg   [facility]

## 2023-05-15 ENCOUNTER — Telehealth: Payer: Self-pay

## 2023-05-15 DIAGNOSIS — Z992 Dependence on renal dialysis: Secondary | ICD-10-CM | POA: Diagnosis not present

## 2023-05-15 DIAGNOSIS — D509 Iron deficiency anemia, unspecified: Secondary | ICD-10-CM | POA: Diagnosis not present

## 2023-05-15 DIAGNOSIS — N186 End stage renal disease: Secondary | ICD-10-CM | POA: Diagnosis not present

## 2023-05-15 NOTE — Telephone Encounter (Signed)
-----   Message from Aurora Mask sent at 05/14/2023  4:00 PM EDT ----- Regarding: RE: hosp follow-up [place] has something scheduled for 6/4 ----- Message ----- From: Roseanne Reno, CMA Sent: 05/14/2023   3:57 PM EDT To: [facility] Scheduling Subject: RE: hosp follow-up                             Please schedule and send back to me for [facility] call  ----- Message ----- From: Marianne Sofia, PA-C Sent: 05/14/2023   3:54 PM EDT To: Lane [facility] Triage Subject: hosp follow-up                                 Pt needs hosp follow-up in 3-4 weeks

## 2023-05-15 NOTE — Telephone Encounter (Signed)
Attempted to contact patient with TOC call. Left a message for patient to call office

## 2023-05-16 LAB — HEPATITIS B CORE ANTIBODY, TOTAL

## 2023-05-16 NOTE — Telephone Encounter (Signed)
Patient contacted regarding discharge from [facility] on 05/14/2023.  Patient understands to follow up with provider Sharlene Dory, NP on 06/17/2023 at 2:30 pm at [facility] in [place]. Patient understands discharge instructions? Yes Patient understands medications and regiment? Yes Patient understands to bring all medications to this visit? Yes  Ask patient:  Are you enrolled in My Chart Yes

## 2023-05-16 NOTE — Telephone Encounter (Signed)
Attempted to contact patient with TOC call. Left a message for patient to call office    

## 2023-05-21 ENCOUNTER — Telehealth (HOSPITAL_COMMUNITY): Payer: Self-pay | Admitting: *Deleted

## 2023-05-21 NOTE — Telephone Encounter (Signed)
Received fax from [facility] requesting vessel mapping. Will give to [facility]. Pt had cancelled surgery 03/12/2023 for AVG placement.

## 2023-05-24 DIAGNOSIS — Z992 Dependence on renal dialysis: Secondary | ICD-10-CM | POA: Diagnosis not present

## 2023-05-24 DIAGNOSIS — N186 End stage renal disease: Secondary | ICD-10-CM | POA: Diagnosis not present

## 2023-05-24 NOTE — Telephone Encounter (Signed)
Surgery rescheduling for left arm AVGG is pending cardiac clearance. Patient has a follow up appt with cardiology on 05/28/23.

## 2023-05-27 DIAGNOSIS — N186 End stage renal disease: Secondary | ICD-10-CM | POA: Diagnosis not present

## 2023-05-27 DIAGNOSIS — D631 Anemia in chronic kidney disease: Secondary | ICD-10-CM | POA: Diagnosis not present

## 2023-05-27 DIAGNOSIS — Z992 Dependence on renal dialysis: Secondary | ICD-10-CM | POA: Diagnosis not present

## 2023-05-27 DIAGNOSIS — D509 Iron deficiency anemia, unspecified: Secondary | ICD-10-CM | POA: Diagnosis not present

## 2023-05-28 ENCOUNTER — Encounter: Payer: No Typology Code available for payment source | Attending: Nurse Practitioner | Admitting: Nurse Practitioner

## 2023-05-28 ENCOUNTER — Other Ambulatory Visit: Payer: Self-pay | Admitting: Nurse Practitioner

## 2023-05-28 ENCOUNTER — Telehealth: Payer: Self-pay | Admitting: Nurse Practitioner

## 2023-05-28 ENCOUNTER — Encounter: Payer: Self-pay | Admitting: Nurse Practitioner

## 2023-05-28 VITALS — BP 152/98 | HR 84 | Ht 61.0 in | Wt 197.2 lb

## 2023-05-28 DIAGNOSIS — I428 Other cardiomyopathies: Secondary | ICD-10-CM

## 2023-05-28 DIAGNOSIS — I1A0 Resistant hypertension: Secondary | ICD-10-CM | POA: Diagnosis not present

## 2023-05-28 DIAGNOSIS — I480 Paroxysmal atrial fibrillation: Secondary | ICD-10-CM

## 2023-05-28 DIAGNOSIS — I5022 Chronic systolic (congestive) heart failure: Secondary | ICD-10-CM

## 2023-05-28 DIAGNOSIS — N186 End stage renal disease: Secondary | ICD-10-CM | POA: Diagnosis not present

## 2023-05-28 DIAGNOSIS — I890 Lymphedema, not elsewhere classified: Secondary | ICD-10-CM | POA: Diagnosis not present

## 2023-05-28 DIAGNOSIS — Z9229 Personal history of other drug therapy: Secondary | ICD-10-CM

## 2023-05-28 MED ORDER — METOPROLOL TARTRATE 25 MG PO TABS: 125.0000 mg | ORAL_TABLET | Freq: Two times a day (BID) | ORAL | 6 refills | Status: DC

## 2023-05-28 NOTE — Telephone Encounter (Signed)
Pt c/o medication issue:  1. Name of Medication:  metoprolol tartrate (LOPRESSOR) 25 MG tablet   2. How are you currently taking this medication (dosage and times per day)? N/A  3. Are you having a reaction (difficulty breathing--STAT)? N/A  4. What is your medication issue? Pharmacy is requesting two separate prescriptions of 100 MG's and 25 MG's be sent in so she only has to take two a day instead of 5.

## 2023-05-28 NOTE — Patient Instructions (Addendum)
Medication Instructions:   Metoprolol refilled today  Continue all other current medications.   Labwork:  none  Testing/Procedures:  none  Follow-Up:  3-4 weeks   Any Other Special Instructions Will Be Listed Below (If Applicable).  BP log - bring to next visit [facility]   If you need a refill on your cardiac medications before your next appointment, please call your pharmacy.

## 2023-05-28 NOTE — Progress Notes (Unsigned)
Office Visit    Patient Name: Patricia Wu Date of Encounter: 05/28/2023  PCP:  Shade Flood, MD    [facility]  Cardiologist:  Marjo Bicker, MD  Advanced Practice Provider:  Sharlene Dory, NP Electrophysiologist:  None   Chief Complaint    Patricia Wu is a 67 y.o. female with a hx of HFmrEF, HTN, HLD, asthma, obesity, ESRD on HD, and A-fib, who presents today for follow-up.   Past Medical History    Past Medical History:  Diagnosis Date   CKD (chronic kidney disease)    Hypercholesteremia    Hypertension    Noncompliance with medication regimen    Past Surgical History:  Procedure Laterality Date   COLONOSCOPY WITH PROPOFOL N/A 10/17/2020   Procedure: COLONOSCOPY WITH PROPOFOL;  Surgeon: Lanelle Bal, DO;  [facility];  Service: Endoscopy;  Laterality: N/A;  1:00pm   IR FLUORO GUIDE CV LINE RIGHT  05/09/2023   IR US GUIDE VASC ACCESS RIGHT  05/10/2023   POLYPECTOMY  10/17/2020   Procedure: POLYPECTOMY;  Surgeon: Lanelle Bal, DO;  Location: [facility];  Service: Endoscopy;;    Allergies  Allergies  Allergen Reactions   Ibuprofen Other (See Comments)    pcp said raises her BP    History of Present Illness    Patricia Wu is a 67 y.o. female with a PMH as mentioned above.  Previous patient of Dr. Purvis Sheffield.  Last seen by him in 2019.   Admitted in March 2024 with A-fib with RVR.  Was found to be be in A-fib with RVR at nephrologist office.  Cardiology was consulted for treatment.  Became stable after 2 doses of IV diltiazem.  Eliquis was initiated.  Heart rate stabilized on metoprolol 50 mg twice daily.  Hospital course complicated by AKI on CKD stage IV.  Baseline creatinine around 3.5.  Per patient's report, she is being evaluated for AV graft.  Nephrology was consulted, was told to follow-up outpatient.  I last saw her for outpatient follow-up on 03/26/2023. She noticed leg swelling after  medication changes. Was requesting to switch to different anticoagulant, d/t price of Eliquis. Denied any palpitations. Denied any chest pain, shortness of breath, syncope, presyncope, dizziness, orthopnea, PND, acute bleeding, or claudication. Weight was up 18 lbs from 12/2022. Compliant with medications. Was seeing Nephrology. Amlodipine d/c due to leg edema. Pt set up to get established with [facility]. Preventice monitor arranged.   Received cardiac monitor alert via live telemetry with conduction with run of V. tach around 15 beats with atrial fibrillation/a flutter.  RN called patient and stated she did not feel any symptoms at that time, was asymptomatic -see telephone note dated April 17, 2023.  Today she presents for follow-up with her husband.  Continues to do about the same since I last saw her.  She has not noticed any chest pain.  Does feel fatigued but denies any shortness of breath.  Is symptomatic with palpitations or tachycardia.  States she has not been taking Lasix due to her kidney disease.  Weight is up over 15 pounds since I last saw her.  States she has not been seen by vascular surgery for pending left arm AV Gortex graft even though I routed my preoperative cardiovascular risk assessment note to VVS surgeon after last office visit.  States she has not heard about this upcoming surgery. Denies any chest pain, shortness of breath, palpitations, syncope, presyncope, dizziness,  orthopnea, PND, acute bleeding, or claudication.  EKG today reveals A-fib with RVR, 120 bpm, T wave abnormality noted.  Was sent to ED for further evaluation.  Recent hospital admission for weight gain, diagnosed with ESRD, had temporary dialysis, started on hemodialysis.  Noted to have some A-fib with RVR.  Today she presents for outpatient follow-up.  She states she is doing well. Does admit to recent elevated blood pressures, including at dialysis. Denies any chest pain, shortness of breath, palpitations,  syncope, presyncope, dizziness, orthopnea, PND, acute bleeding, or claudication. Weight is down  36 lbs since hospitalization. States her lower extremity edema is stable.   EKGs/Labs/Other Studies Reviewed:   The following studies were reviewed today:   EKG:  EKG is not ordered today.   Cardiac preventice monitor pending  Echo 02/2023:   1. Left ventricular ejection fraction, by estimation, is 40 to 45%. The  left ventricle has mildly decreased function. The left ventricle  demonstrates global hypokinesis. There is moderate left ventricular  hypertrophy. Left ventricular diastolic function   could not be evaluated.   2. Right ventricular systolic function is normal. The right ventricular  size is normal. There is normal pulmonary artery systolic pressure. The  estimated right ventricular systolic pressure is 28.0 mmHg.   3. Right atrial size was moderately dilated.   4. The mitral valve is normal in structure. Mild mitral valve  regurgitation. No evidence of mitral stenosis.   5. The aortic valve is tricuspid. There is mild calcification of the  aortic valve. Aortic valve regurgitation is not visualized. No aortic  stenosis is present.   6. The inferior vena cava is normal in size with greater than 50%  respiratory variability, suggesting right atrial pressure of 3 mmHg.   Comparison(s): No prior Echocardiogram.  Recent Labs: 03/04/2023: TSH 2.381 05/07/2023: ALT 50; B Natriuretic Peptide 3,927.0 05/10/2023: Magnesium 1.8 05/13/2023: BUN 38; Creatinine, Ser 7.24; Hemoglobin 11.0; Platelets 152; Potassium 3.7; Sodium 136  Recent Lipid Panel    Component Value Date/Time   CHOL 187 01/16/2023 1201   CHOL 190 02/08/2021 0950   TRIG 83.0 01/16/2023 1201   HDL 49.50 01/16/2023 1201   HDL 41 02/08/2021 0950   CHOLHDL 4 01/16/2023 1201   VLDL 16.6 01/16/2023 1201   LDLCALC 121 (H) 01/16/2023 1201   LDLCALC 128 (H) 02/08/2021 0950    Risk Assessment/Calculations:   CHA2DS2-VASc  Score = 4  This indicates a 4.8% annual risk of stroke. The patient's score is based upon: CHF History: 1 HTN History: 1 Diabetes History: 0 Stroke History: 0 Vascular Disease History: 0 Age Score: 1 Gender Score: 1   The 10-year ASCVD risk score (Arnett DK, et al., 2019) is: 13.5%   Values used to calculate the score:     Age: 76 years     Sex: Female     Is Non-Hispanic African American: Yes     Diabetic: No     Tobacco smoker: No     Systolic Blood Pressure: 152 mmHg     Is BP treated: Yes     HDL Cholesterol: 49.5 mg/dL     Total Cholesterol: 187 mg/dL  Home Medications   Current Meds  Medication Sig   acetaminophen (TYLENOL) 325 MG tablet Take 325-650 mg by mouth every 6 (six) hours as needed (pain.).    albuterol (VENTOLIN HFA) 108 (90 Base) MCG/ACT inhaler Inhale 1-2 puffs into the lungs every 6 (six) hours as needed for wheezing or shortness of breath.  atorvastatin (LIPITOR) 10 MG tablet TAKE 1 TABLET(10 MG) BY MOUTH DAILY (Patient taking differently: Take 10 mg by mouth daily.)   calcitRIOL (ROCALTROL) 0.25 MCG capsule Take 0.5 mcg by mouth daily.   multivitamin (RENA-VIT) TABS tablet Take 1 tablet by mouth at bedtime.   sevelamer carbonate (RENVELA) 800 MG tablet Take 2 tablets (1,600 mg total) by mouth 3 (three) times daily with meals.   warfarin (COUMADIN) 2.5 MG tablet Take 1 tablet daily or as directed by Coumadin Clinic   metoprolol tartrate (LOPRESSOR) 25 MG tablet Take 5 tablets (125 mg total) by mouth 2 (two) times daily.    Review of Systems    All other systems reviewed and are otherwise negative except as noted above.  Physical Exam    VS:  BP (!) 152/98 (BP Location: Left Arm, Cuff Size: Normal)   Pulse 84   Ht 5\' 1"  (1.549 m)   Wt 197 lb 3.2 oz (89.4 kg)   SpO2 96%   BMI 37.26 kg/m  , BMI Body mass index is 37.26 kg/m.  Wt Readings from Last 3 Encounters:  05/28/23 197 lb 3.2 oz (89.4 kg)  05/14/23 203 lb 11.3 oz (92.4 kg)  05/07/23 233  lb 3.2 oz (105.8 kg)    Repeat BP: 155/100  GEN: Obese, 67 y.o. female in no acute distress. HEENT: normal. Neck: Supple, no JVD, carotid bruits, or masses. Cardiac: S1/S2, irregular rhythm and regular rate, no murmurs, rubs, or gallops. No clubbing, cyanosis. Nonpitting/dependent edema along BLE, Radials2+ /PT 1+ and equal bilaterally. Hemodialysis port located on patient's right upper anterior chest.  Respiratory:  Respirations regular and unlabored, clear to auscultation bilaterally. MS: No deformity or atrophy. Skin: Warm and dry, no rash. Neuro:  Strength and sensation are intact. Psych: Normal affect.  Assessment & Plan   HFmrEF, NICM, lymphedema Stage C, NYHA class II symptoms. Echo 02/2023 revealed EF 40-45%, most likely d/t tachy-mediated from A-fib with RVR. Lasix previously d/c and held d/t AKI on CKD. GDMT limited d/t kidney disease. No medication changes at this time.  Recommended low salt, heart healthy diet encouraged. Recommended compression stockings for leg edema/leg elevation. If no improvement by next visit, consider referral to lymphedema clinic.  ED precautions discussed. Consider HF clinic referral in future if medications cannot be further titrated, update limited Echo in 1 month.    PAF, long term anticoagulation Denies any tachycardia or palpitations. Noted to have some A-fib with RVR during recent hospitalization. Dr. Tenny Craw who was consulted stated, "The pt will need better rate control or, if pursue rhythm control, an antiarrhythmic.   Amiodarone is probably only option given other medical problems. She has been subtherapeutic on coumadin  Would need 4 wks before considering dosing to convert. I do not think TEE/DCCV would be beneficial as she will probably revert to afib. Heart rate well controlled. Preventice cardiac monitor results are not available.  Continue metoprolol and follow-up at coumadin clinic as scheduled, may need to consider amiodarone dosing in future if  needed.    ESRD on HD  Since last OV, she has been started on HD - MWF scheduled. Lasix has been d/c.   Avoid nephrotoxic agents.  Encourage adequate hydration.  No medication changes at this time.  Continue follow-up with nephrology and PCP.   Resistant HTN BP elevated today. Continue Lopressor. GDMT limited at this time d/t her ESRD. Follow-up with Nephrology and appreciate recs. Given BP log and salty six and discussed to monitor BP at  home at least 2 hours after medications and sitting for 5-10 minutes.  If no improvement by follow-up, plan to start hydralazine, refer to advanced hypertension clinic /consult clinical pharmD.   Disposition: Follow up in 3-4 week(s) with Vishnu P Mallipeddi, MD or [facility].  Signed, Sharlene Dory, NP 05/30/2023, 11:15 AM [facility]

## 2023-05-28 NOTE — Telephone Encounter (Signed)
Already sent to pharmacy 

## 2023-06-03 DIAGNOSIS — Z992 Dependence on renal dialysis: Secondary | ICD-10-CM | POA: Diagnosis not present

## 2023-06-03 DIAGNOSIS — N186 End stage renal disease: Secondary | ICD-10-CM | POA: Diagnosis not present

## 2023-06-05 ENCOUNTER — Other Ambulatory Visit: Payer: Self-pay

## 2023-06-05 DIAGNOSIS — N184 Chronic kidney disease, stage 4 (severe): Secondary | ICD-10-CM

## 2023-06-14 ENCOUNTER — Encounter (HOSPITAL_COMMUNITY)
Admission: RE | Admit: 2023-06-14 | Discharge: 2023-06-14 | Disposition: A | Discharge status: Home / Self Care | Payer: No Typology Code available for payment source | Source: Ambulatory Visit | Attending: Vascular Surgery | Admitting: Vascular Surgery

## 2023-06-14 DIAGNOSIS — N186 End stage renal disease: Secondary | ICD-10-CM

## 2023-06-18 ENCOUNTER — Encounter (HOSPITAL_COMMUNITY): Payer: Self-pay | Admitting: Vascular Surgery

## 2023-06-18 ENCOUNTER — Other Ambulatory Visit: Payer: Self-pay

## 2023-06-18 ENCOUNTER — Encounter (HOSPITAL_COMMUNITY)
Admission: RE | Disposition: A | Discharge status: Home / Self Care | Payer: Self-pay | Source: Home / Self Care | Attending: Vascular Surgery

## 2023-06-18 ENCOUNTER — Ambulatory Visit (HOSPITAL_BASED_OUTPATIENT_CLINIC_OR_DEPARTMENT_OTHER): Payer: No Typology Code available for payment source | Admitting: Anesthesiology

## 2023-06-18 ENCOUNTER — Ambulatory Visit (HOSPITAL_COMMUNITY)
Admission: RE | Admit: 2023-06-18 | Discharge: 2023-06-18 | Disposition: A | Discharge status: Home / Self Care | Payer: No Typology Code available for payment source | Attending: Vascular Surgery | Admitting: Vascular Surgery

## 2023-06-18 ENCOUNTER — Ambulatory Visit (HOSPITAL_COMMUNITY): Payer: No Typology Code available for payment source | Admitting: Anesthesiology

## 2023-06-18 DIAGNOSIS — D631 Anemia in chronic kidney disease: Secondary | ICD-10-CM | POA: Diagnosis not present

## 2023-06-18 DIAGNOSIS — Z992 Dependence on renal dialysis: Secondary | ICD-10-CM | POA: Insufficient documentation

## 2023-06-18 DIAGNOSIS — N186 End stage renal disease: Secondary | ICD-10-CM | POA: Insufficient documentation

## 2023-06-18 DIAGNOSIS — I509 Heart failure, unspecified: Secondary | ICD-10-CM | POA: Insufficient documentation

## 2023-06-18 DIAGNOSIS — I132 Hypertensive heart and chronic kidney disease with heart failure and with stage 5 chronic kidney disease, or end stage renal disease: Secondary | ICD-10-CM | POA: Diagnosis not present

## 2023-06-18 DIAGNOSIS — N185 Chronic kidney disease, stage 5: Secondary | ICD-10-CM | POA: Diagnosis not present

## 2023-06-18 HISTORY — PX: AV FISTULA PLACEMENT: SHX1204

## 2023-06-18 LAB — HEMOGLOBIN AND HEMATOCRIT, BLOOD
HCT: 39.8 % (ref 36.0–46.0)
Hemoglobin: 12.1 g/dL (ref 12.0–15.0)

## 2023-06-18 LAB — BASIC METABOLIC PANEL
Anion gap: 12 (ref 5–15)
BUN: 27 mg/dL — ABNORMAL HIGH (ref 8–23)
CO2: 24 mmol/L (ref 22–32)
Calcium: 9.3 mg/dL (ref 8.9–10.3)
Chloride: 103 mmol/L (ref 98–111)
Creatinine, Ser: 5.51 mg/dL — ABNORMAL HIGH (ref 0.44–1.00)
GFR, Estimated: 8 mL/min — ABNORMAL LOW (ref >60)
Glucose, Bld: 106 mg/dL — ABNORMAL HIGH (ref 70–99)
Potassium: 3.5 mmol/L (ref 3.5–5.1)
Sodium: 139 mmol/L (ref 135–145)

## 2023-06-18 SURGERY — INSERTION OF ARTERIOVENOUS (AV) GORE-TEX GRAFT ARM
Anesthesia: General | Site: Arm Upper | Laterality: Left

## 2023-06-18 MED ORDER — OXYCODONE HCL 5 MG/5ML PO SOLN: 5.0000 mg | Freq: Once | ORAL | Status: DC | PRN

## 2023-06-18 MED ORDER — CHLORHEXIDINE GLUCONATE 4 % EX SOLN: 60.0000 mL | Freq: Once | CUTANEOUS | Status: DC

## 2023-06-18 MED ORDER — 0.9 % SODIUM CHLORIDE (POUR BTL) OPTIME
TOPICAL | Status: DC | PRN
  Administered 2023-06-18: 1000 mL

## 2023-06-18 MED ORDER — CEFAZOLIN SODIUM-DEXTROSE 2-4 GM/100ML-% IV SOLN
2.0000 g | INTRAVENOUS | Status: AC
  Administered 2023-06-18: 2 g via INTRAVENOUS

## 2023-06-18 MED ORDER — CEFAZOLIN SODIUM-DEXTROSE 2-4 GM/100ML-% IV SOLN
INTRAVENOUS | Status: AC
  Filled 2023-06-18: qty 100

## 2023-06-18 MED ORDER — ONDANSETRON HCL 4 MG/2ML IJ SOLN: 4.0000 mg | Freq: Once | INTRAMUSCULAR | Status: DC | PRN

## 2023-06-18 MED ORDER — PROPOFOL 500 MG/50ML IV EMUL
INTRAVENOUS | Status: AC
  Filled 2023-06-18: qty 50

## 2023-06-18 MED ORDER — OXYCODONE HCL 5 MG PO TABS: 5.0000 mg | ORAL_TABLET | Freq: Once | ORAL | Status: DC | PRN

## 2023-06-18 MED ORDER — OXYCODONE-ACETAMINOPHEN 5-325 MG PO TABS: 1.0000 | ORAL_TABLET | Freq: Four times a day (QID) | ORAL | 0 refills | Status: DC | PRN

## 2023-06-18 MED ORDER — HEPARIN SODIUM (PORCINE) 1000 UNIT/ML IJ SOLN
INTRAMUSCULAR | Status: AC
  Filled 2023-06-18: qty 6

## 2023-06-18 MED ORDER — CHLORHEXIDINE GLUCONATE 0.12 % MT SOLN
OROMUCOSAL | Status: AC
  Administered 2023-06-18: 15 mL
  Filled 2023-06-18: qty 15

## 2023-06-18 MED ORDER — SODIUM CHLORIDE 0.9 % IV SOLN: INTRAVENOUS | Status: DC

## 2023-06-18 MED ORDER — LIDOCAINE-EPINEPHRINE 0.5 %-1:200000 IJ SOLN
INTRAMUSCULAR | Status: AC
  Filled 2023-06-18: qty 50

## 2023-06-18 MED ORDER — PHENYLEPHRINE HCL-NACL 20-0.9 MG/250ML-% IV SOLN
INTRAVENOUS | Status: AC
  Filled 2023-06-18: qty 250

## 2023-06-18 MED ORDER — METOPROLOL TARTRATE 5 MG/5ML IV SOLN
INTRAVENOUS | Status: DC | PRN
  Administered 2023-06-18 (×2): 1 mg via INTRAVENOUS
  Administered 2023-06-18: 2 mg via INTRAVENOUS

## 2023-06-18 MED ORDER — FENTANYL CITRATE (PF) 100 MCG/2ML IJ SOLN
INTRAMUSCULAR | Status: DC | PRN
  Administered 2023-06-18: 50 ug via INTRAVENOUS
  Administered 2023-06-18: 25 ug via INTRAVENOUS

## 2023-06-18 MED ORDER — LIDOCAINE-EPINEPHRINE 0.5 %-1:200000 IJ SOLN
INTRAMUSCULAR | Status: DC | PRN
  Administered 2023-06-18: 9 mL

## 2023-06-18 MED ORDER — LIDOCAINE HCL 1 % IJ SOLN
INTRAMUSCULAR | Status: DC | PRN
  Administered 2023-06-18: 60 mg via INTRADERMAL

## 2023-06-18 MED ORDER — PHENYLEPHRINE HCL-NACL 20-0.9 MG/250ML-% IV SOLN
INTRAVENOUS | Status: DC | PRN
  Administered 2023-06-18: 30 ug/min via INTRAVENOUS

## 2023-06-18 MED ORDER — PHENYLEPHRINE HCL (PRESSORS) 10 MG/ML IV SOLN
INTRAVENOUS | Status: DC | PRN
  Administered 2023-06-18: 80 ug via INTRAVENOUS
  Administered 2023-06-18 (×3): 100 ug via INTRAVENOUS

## 2023-06-18 MED ORDER — METOPROLOL TARTRATE 5 MG/5ML IV SOLN
5.0000 mg | INTRAVENOUS | Status: DC | PRN
  Administered 2023-06-18: 5 mg via INTRAVENOUS

## 2023-06-18 MED ORDER — HEPARIN 6000 UNIT IRRIGATION SOLUTION
Status: DC | PRN
  Administered 2023-06-18: 1

## 2023-06-18 MED ORDER — FENTANYL CITRATE (PF) 100 MCG/2ML IJ SOLN
INTRAMUSCULAR | Status: AC
  Filled 2023-06-18: qty 2

## 2023-06-18 MED ORDER — FENTANYL CITRATE PF 50 MCG/ML IJ SOSY: 25.0000 ug | PREFILLED_SYRINGE | INTRAMUSCULAR | Status: DC | PRN

## 2023-06-18 MED ORDER — METOPROLOL TARTRATE 5 MG/5ML IV SOLN
INTRAVENOUS | Status: AC
  Filled 2023-06-18: qty 5

## 2023-06-18 MED ORDER — PROPOFOL 10 MG/ML IV BOLUS
INTRAVENOUS | Status: DC | PRN
  Administered 2023-06-18: 30 mg via INTRAVENOUS
  Administered 2023-06-18: 120 mg via INTRAVENOUS

## 2023-06-18 MED ORDER — LIDOCAINE HCL (PF) 2 % IJ SOLN
INTRAMUSCULAR | Status: AC
  Filled 2023-06-18: qty 10

## 2023-06-18 SURGICAL SUPPLY — 39 items
ADH SKN CLS APL DERMABOND .7 (GAUZE/BANDAGES/DRESSINGS) ×1
ARMBAND PINK RESTRICT EXTREMIT (MISCELLANEOUS) ×1 IMPLANT
BAG HAMPER (MISCELLANEOUS) ×1 IMPLANT
CANNULA VESSEL 3MM 2 BLNT TIP (CANNULA) ×1 IMPLANT
CLIP LIGATING EXTRA MED SLVR (CLIP) ×1 IMPLANT
CLIP LIGATING EXTRA SM BLUE (MISCELLANEOUS) ×1 IMPLANT
COVER LIGHT HANDLE STERIS (MISCELLANEOUS) ×2 IMPLANT
COVER MAYO STAND XLG (MISCELLANEOUS) ×1 IMPLANT
DECANTER SPIKE VIAL GLASS SM (MISCELLANEOUS) ×1 IMPLANT
DERMABOND ADVANCED .7 DNX12 (GAUZE/BANDAGES/DRESSINGS) ×1 IMPLANT
ELECT REM PT RETURN 9FT ADLT (ELECTROSURGICAL) ×1
ELECTRODE REM PT RTRN 9FT ADLT (ELECTROSURGICAL) ×1 IMPLANT
GAUZE SPONGE 4X4 12PLY STRL (GAUZE/BANDAGES/DRESSINGS) ×2 IMPLANT
GLOVE BIOGEL PI IND STRL 7.0 (GLOVE) ×2 IMPLANT
GLOVE SURG MICRO LTX SZ7.5 (GLOVE) ×1 IMPLANT
GOWN STRL REUS W/TWL LRG LVL3 (GOWN DISPOSABLE) ×3 IMPLANT
IV NS 500ML (IV SOLUTION) ×2
IV NS 500ML BAXH (IV SOLUTION) ×2 IMPLANT
KIT BLADEGUARD II DBL (SET/KITS/TRAYS/PACK) ×1 IMPLANT
KIT TURNOVER KIT A (KITS) ×1 IMPLANT
MANIFOLD NEPTUNE II (INSTRUMENTS) ×1 IMPLANT
MARKER SKIN DUAL TIP RULER LAB (MISCELLANEOUS) ×2 IMPLANT
NDL HYPO 18GX1.5 BLUNT FILL (NEEDLE) ×1 IMPLANT
NEEDLE HYPO 18GX1.5 BLUNT FILL (NEEDLE) ×1 IMPLANT
NS IRRIG 1000ML POUR BTL (IV SOLUTION) ×1 IMPLANT
PACK CV ACCESS (CUSTOM PROCEDURE TRAY) ×1 IMPLANT
PAD ARMBOARD 7.5X6 YLW CONV (MISCELLANEOUS) ×1 IMPLANT
SET BASIN LINEN APH (SET/KITS/TRAYS/PACK) ×1 IMPLANT
SOL PREP POV-IOD 4OZ 10% (MISCELLANEOUS) ×1 IMPLANT
SOL PREP PROV IODINE SCRUB 4OZ (MISCELLANEOUS) ×1 IMPLANT
SUT PROLENE 6 0 CC (SUTURE) ×1 IMPLANT
SUT SILK 2 0 FSL 18 (SUTURE) ×1 IMPLANT
SUT VIC AB 3-0 SH 27 (SUTURE) ×1
SUT VIC AB 3-0 SH 27X BRD (SUTURE) ×1 IMPLANT
SYR 10ML LL (SYRINGE) ×1 IMPLANT
SYR 50ML LL SCALE MARK (SYRINGE) ×1 IMPLANT
SYR CONTROL 10ML LL (SYRINGE) ×1 IMPLANT
SYR TOOMEY 50ML (SYRINGE) IMPLANT
UNDERPAD 30X36 HEAVY ABSORB (UNDERPADS AND DIAPERS) ×1 IMPLANT

## 2023-06-18 NOTE — Anesthesia Procedure Notes (Signed)
Procedure Name: LMA Insertion Date/Time: 06/18/2023 9:39 AM  Performed by: Moshe Salisbury, CRNAPre-anesthesia Checklist: Patient identified, Patient being monitored, Emergency Drugs available, Timeout performed and Suction available Patient Re-evaluated:Patient Re-evaluated prior to induction Oxygen Delivery Method: Circle System Utilized Preoxygenation: Pre-oxygenation with 100% oxygen Induction Type: IV induction Ventilation: Mask ventilation without difficulty LMA: LMA inserted LMA Size: 3.0 Number of attempts: 1 Placement Confirmation: positive ETCO2 and breath sounds checked- equal and bilateral

## 2023-06-18 NOTE — Anesthesia Preprocedure Evaluation (Signed)
Anesthesia Evaluation  Patient identified by MRN, date of birth, ID band Patient awake    Reviewed: Allergy & Precautions, H&P , NPO status , Patient's Chart, lab work & pertinent test results, reviewed documented beta blocker date and time   Airway Mallampati: II  TM Distance: >3 FB Neck ROM: full    Dental no notable dental hx.    Pulmonary neg pulmonary ROS, asthma    Pulmonary exam normal breath sounds clear to auscultation       Cardiovascular Exercise Tolerance: Good hypertension, +CHF  negative cardio ROS  Rhythm:regular Rate:Normal     Neuro/Psych negative neurological ROS  negative psych ROS   GI/Hepatic negative GI ROS, Neg liver ROS,,,  Endo/Other  negative endocrine ROS    Renal/GU ESRF and DialysisRenal diseasenegative Renal ROS  negative genitourinary   Musculoskeletal   Abdominal   Peds  Hematology negative hematology ROS (+) Blood dyscrasia, anemia   Anesthesia Other Findings   Reproductive/Obstetrics negative OB ROS                             Anesthesia Physical Anesthesia Plan  ASA: 3  Anesthesia Plan: General   Post-op Pain Management:    Induction:   PONV Risk Score and Plan: Ondansetron  Airway Management Planned:   Additional Equipment:   Intra-op Plan:   Post-operative Plan:   Informed Consent: I have reviewed the patients History and Physical, chart, labs and discussed the procedure including the risks, benefits and alternatives for the proposed anesthesia with the patient or authorized representative who has indicated his/her understanding and acceptance.     Dental Advisory Given  Plan Discussed with: CRNA  Anesthesia Plan Comments:        Anesthesia Quick Evaluation

## 2023-06-18 NOTE — H&P (Signed)
Larina Earthly, MD Physician Specialty: Vascular Surgery   Progress Notes    Signed   Encounter Date: 09/26/2022   Signed                                            [facility] [place]   Patient name: Patricia Wu            MRN: [id number]        DOB: 1956/11/14        Sex: female   REASON FOR CONSULT: Discuss access for hemodialysis   HPI: MAZAL EBEY is a 67 y.o. female, who is here today for discussion of access for hemodialysis.  She is stage IV.  She does not have any uremic symptoms.  She is here today with her husband.  She is also being evaluated for potential transplant.  She is right-handed.  She is not on any anticoagulation.  She does not have a pacemaker       Past Medical History:  Diagnosis Date   CKD (chronic kidney disease)     Hypercholesteremia     Hypertension     Noncompliance with medication regimen             Family History  Problem Relation Age of Onset   Liver cancer Neg Hx     Colon cancer Neg Hx        SOCIAL HISTORY: Social History         Socioeconomic History   Marital status: Married      Spouse name: Not on file   Number of children: 2   Years of education: Not on file   Highest education level: Not on file  Occupational History   Not on file  Tobacco Use   Smoking status: Never   Smokeless tobacco: Never  Vaping Use   Vaping Use: Never used  Substance and Sexual Activity   Alcohol use: No   Drug use: No   Sexual activity: Yes  Other Topics Concern   Not on file  Social History Narrative   Not on file    Social Determinants of Health    Financial Resource Strain: Not on file  Food Insecurity: Not on file  Transportation Needs: Not on file  Physical Activity: Not on file  Stress: Not on file  Social Connections: Not on file  Intimate Partner Violence: Not on file           Allergies  Allergen Reactions   Ibuprofen Other (See Comments)      pcp said raises her BP             Current Outpatient Medications  Medication Sig Dispense Refill   acetaminophen (TYLENOL) 325 MG tablet Take 325-650 mg by mouth every 6 (six) hours as needed (pain.).        amLODipine (NORVASC) 10 MG tablet Take 1 tablet (10 mg total) by mouth daily. 90 tablet 2   atorvastatin (LIPITOR) 10 MG tablet Take 1 tablet (10 mg total) by mouth daily. 90 tablet 2   Cholecalciferol (VITAMIN D) 125 MCG (5000 UT) CAPS Take by mouth.       furosemide (LASIX) 20 MG tablet Take 0.5 tablets (10 mg total) by mouth daily as needed. For 2-3 days of needed for leg swelling. (Patient not taking: Reported  on 09/26/2022) 15 tablet 1    No current facility-administered medications for this visit.      REVIEW OF SYSTEMS:  [X]  denotes positive finding, [ ]  denotes negative finding Cardiac   Comments:  Chest pain or chest pressure:      Shortness of breath upon exertion:      Short of breath when lying flat:      Irregular heart rhythm:             Vascular      Pain in calf, thigh, or hip brought on by ambulation:      Pain in feet at night that wakes you up from your sleep:       Blood clot in your veins:      Leg swelling:              Pulmonary      Oxygen at home:      Productive cough:       Wheezing:              Neurologic      Sudden weakness in arms or legs:       Sudden numbness in arms or legs:       Sudden onset of difficulty speaking or slurred speech:      Temporary loss of vision in one eye:       Problems with dizziness:              Gastrointestinal      Blood in stool:       Vomited blood:              Genitourinary      Burning when urinating:       Blood in urine:             Psychiatric      Major depression:              Hematologic      Bleeding problems:      Problems with blood clotting too easily:             Skin      Rashes or ulcers:             Constitutional      Fever or chills:          PHYSICAL EXAM:    Vitals:    09/26/22 1100  BP: (!)  170/83  Pulse: 74  Temp: 98.1 F (36.7 C)  SpO2: 100%  Weight: 195 lb (88.5 kg)  Height: 5\' 2"  (1.575 m)      GENERAL: The patient is a well-nourished female, in no acute distress. The vital signs are documented above. CARDIOVASCULAR: 2+ radial pulses bilaterally.  Small surface veins bilaterally PULMONARY: There is good air exchange  MUSCULOSKELETAL: There are no major deformities or cyanosis. NEUROLOGIC: No focal weakness or paresthesias are detected. SKIN: There are no ulcers or rashes noted. PSYCHIATRIC: The patient has a normal affect.   DATA:  I imaged her arm veins bilaterally with SonoSite ultrasound.  She has extremely small cephalic and basilic veins bilaterally   MEDICAL ISSUES: Had a long discussion with the patient and her husband regarding access options.  I discussed tunneled hemodialysis catheter for acute access.  Also discussed AV graft and AV fistula.  She is not a fistula candidate due to extremely small surface veins bilaterally.  That her best first option for hemodialysis would be left arm  AV Gore-Tex graft.  Explained that we would defer this until she is approaching need for hemodialysis.  I will follow-up with Dr. Arrie Aran regarding timing.  Explained this will be done as an outpatient at [facility].     Larina Earthly, MD [facility] [phone] Pager [phone]   Note: Portions of this report may have been transcribed using voice recognition software.  Every effort has been made to ensure accuracy; however, inadvertent computerized transcription errors may still be present.          Electronically signed by Larina Earthly, MD at 09/26/2022 12:14 PM     Addendum:  The patient has been re-examined and re-evaluated.  The patient's history and physical has been reviewed and is unchanged.    Evann Koelzer Kerkman is a 67 y.o. female is being admitted with CKD IV. All the risks, benefits and other  treatment options have been discussed with the patient. The patient has consented to proceed with Procedure(s): INSERTION OF LEFT ARM ARTERIOVENOUS (AV) GORE-TEX GRAFT as a surgical intervention.  Parag Dorton 06/18/2023 8:50 AM Vascular and Vein Surgery

## 2023-06-18 NOTE — Transfer of Care (Signed)
Immediate Anesthesia Transfer of Care Note  Patient: Patricia Wu  Procedure(s) Performed: INSERTION OF LEFT ARM ARTERIOVENOUS (AV) FISTULA (Left: Arm Upper)  Patient Location: PACU  Anesthesia Type:General  Level of Consciousness: awake  Airway & Oxygen Therapy: Patient Spontanous Breathing  Post-op Assessment: Report given to RN  Post vital signs: Reviewed and stable  Last Vitals:  Vitals Value Taken Time  BP 150/93 06/18/23 1115  Temp 36.6 C 06/18/23 1100  Pulse 109 06/18/23 1115  Resp 74 06/18/23 1115  SpO2 96 % 06/18/23 1115    Last Pain:  Vitals:   06/18/23 1100  PainSc: Asleep         Complications: No notable events documented.

## 2023-06-18 NOTE — Discharge Instructions (Addendum)
[facility]  Discharge Instructions  AV Fistula or Graft Surgery for Dialysis Access  Please refer to the following instructions for your post-procedure care. Your surgeon or physician assistant will discuss any changes with you.  Activity  You may drive the day following your surgery, if you are comfortable and no longer taking prescription pain medication. Resume full activity as the soreness in your incision resolves.  Bathing/Showering  You may shower after you go home. Keep your incision dry for 48 hours. Do not soak in a bathtub, hot tub, or swim until the incision heals completely. You may not shower if you have a hemodialysis catheter.  Incision Care  Clean your incision with mild soap and water after 48 hours. Pat the area dry with a clean towel. You do not need a bandage unless otherwise instructed. Do not apply any ointments or creams to your incision. You may have skin glue on your incision. Do not peel it off. It will come off on its own in about one week. Your arm may swell a bit after surgery. To reduce swelling use pillows to elevate your arm so it is above your heart. Your doctor will tell you if you need to lightly wrap your arm with an ACE bandage.  Diet  Resume your normal diet. There are not special food restrictions following this procedure. In order to heal from your surgery, it is CRITICAL to get adequate nutrition. Your body requires vitamins, minerals, and protein. Vegetables are the best source of vitamins and minerals. Vegetables also provide the perfect balance of protein. Processed food has little nutritional value, so try to avoid this.  Medications  Resume taking all of your medications. If your incision is causing pain, you may take over-the counter pain relievers such as acetaminophen (Tylenol). If you were prescribed a stronger pain medication, please be aware these medications can cause nausea and constipation. Prevent  nausea by taking the medication with a snack or meal. Avoid constipation by drinking plenty of fluids and eating foods with high amount of fiber, such as fruits, vegetables, and grains.  Do not take Tylenol if you are taking prescription pain medications.  Follow up Your surgeon may want to see you in the office following your access surgery. If so, this will be arranged at the time of your surgery.  Please call us immediately for any of the following conditions:  Increased pain, redness, drainage (pus) from your incision site Fever of 101 degrees or higher Severe or worsening pain at your incision site Hand pain or numbness.  Reduce your risk of vascular disease:  Stop smoking. If you would like help, call [facility] at 1-800-QUIT-NOW ((434)870-5196) or [place] at 707-495-8891  Manage your cholesterol Maintain a desired weight Control your diabetes Keep your blood pressure down  Dialysis  It will take several weeks to several months for your new dialysis access to be ready for use. Your surgeon will determine when it is okay to use it. Your nephrologist will continue to direct your dialysis. You can continue to use your Permcath until your new access is ready for use.   06/18/2023 Patricia Wu [id number] [birth year]  Surgeon(s): Early, Kristen Loader, MD  Procedure(s): INSERTION OF LEFT ARM ARTERIOVENOUS (AV) FISTULA   May stick graft immediately   May stick graft on designated area only:    Do not stick fistula for 12 weeks    If you have any questions, please call the office at  [phone].  

## 2023-06-18 NOTE — Op Note (Signed)
    OPERATIVE REPORT  DATE OF SURGERY: 06/18/2023  PATIENT: Patricia Wu, 67 y.o. female MRN: [id number]  DOB: Jan 09, 1956  PRE-OPERATIVE DIAGNOSIS: End-stage renal disease  POST-OPERATIVE DIAGNOSIS:  Same  PROCEDURE: Left brachiocephalic AV fistula creation  SURGEON:  Gretta Began, M.D.  PHYSICIAN ASSISTANT: Rebeca Alert, RNFA  The assistant was needed for exposure and to expedite the case  ANESTHESIA: LMA  EBL: per anesthesia record  Total I/O In: 100 [IV Piggyback:100] Out: -   BLOOD ADMINISTERED: none  DRAINS: none  SPECIMEN: none  COUNTS CORRECT:  YES  PATIENT DISPOSITION:  PACU - hemodynamically stable  PROCEDURE DETAILS: The patient was taken the operating placed supine position where the area of the left arm was prepped and draped in usual sterile fashion.  SonoSite ultrasound was used to visualize the veins.  The patient did have a adequate cephalic vein at the antecubital space and proximally for fistula attempt.  Using local anesthesia in addition to LMA, the area at the antecubital space was anesthetized.  Incision was made carried down to isolate the cephalic vein which was of good caliber.  Tributary branches were ligated and divided.  The brachial artery was exposed through the same incision and was also of adequate caliber.  The vein was ligated distally and divided and was brought into approximation to the brachial artery.  The artery was occluded proximally and distally and was opened with an 11 blade and sent longitudinally with Potts scissors.  A small arteriotomy was made to reduce risk of steal.  The vein was spatulated and sewn end-to-side with a running 6-0 Prolene suture.  Clamps were removed and excellent thrill was noted.  The patient did maintain a left radial pulse.  The wound was closed with 3-0 Vicryl in the subcutaneous and subcuticular tissue.  Dermabond was applied and the patient was transferred to the recovery room in stable  condition   Larina Earthly, M.D., [facility] 06/18/2023 11:03 AM  Note: Portions of this report may have been transcribed using voice recognition software.  Every effort has been made to ensure accuracy; however, inadvertent computerized transcription errors may still be present.

## 2023-06-20 ENCOUNTER — Encounter (HOSPITAL_COMMUNITY): Payer: Self-pay | Admitting: Vascular Surgery

## 2023-06-20 NOTE — Anesthesia Postprocedure Evaluation (Signed)
Anesthesia Post Note  Patient: Patricia Wu  Procedure(s) Performed: INSERTION OF LEFT ARM ARTERIOVENOUS (AV) FISTULA (Left: Arm Upper)  Patient location during evaluation: Phase II Anesthesia Type: General Level of consciousness: awake Pain management: pain level controlled Vital Signs Assessment: post-procedure vital signs reviewed and stable Respiratory status: spontaneous breathing and respiratory function stable Cardiovascular status: blood pressure returned to baseline and stable Postop Assessment: no headache and no apparent nausea or vomiting Anesthetic complications: no Comments: Late entry   No notable events documented.   Last Vitals:  Vitals:   06/18/23 1155 06/18/23 1204  BP: (!) 160/97 (!) 148/88  Pulse: 100 (!) 115  Resp:    Temp:  36.6 C  SpO2: 98% 95%    Last Pain:  Vitals:   06/18/23 1204  TempSrc: Oral  PainSc: 0-No pain                 Windell Norfolk

## 2023-06-23 DIAGNOSIS — N186 End stage renal disease: Secondary | ICD-10-CM | POA: Diagnosis not present

## 2023-06-23 DIAGNOSIS — Z992 Dependence on renal dialysis: Secondary | ICD-10-CM | POA: Diagnosis not present

## 2023-06-24 DIAGNOSIS — D509 Iron deficiency anemia, unspecified: Secondary | ICD-10-CM | POA: Diagnosis not present

## 2023-06-24 DIAGNOSIS — E559 Vitamin D deficiency, unspecified: Secondary | ICD-10-CM | POA: Diagnosis not present

## 2023-06-24 DIAGNOSIS — D631 Anemia in chronic kidney disease: Secondary | ICD-10-CM | POA: Diagnosis not present

## 2023-06-24 DIAGNOSIS — Z992 Dependence on renal dialysis: Secondary | ICD-10-CM | POA: Diagnosis not present

## 2023-06-24 DIAGNOSIS — N186 End stage renal disease: Secondary | ICD-10-CM | POA: Diagnosis not present

## 2023-06-25 ENCOUNTER — Telehealth: Payer: Self-pay | Admitting: Family Medicine

## 2023-06-25 ENCOUNTER — Encounter: Payer: No Typology Code available for payment source | Attending: Nurse Practitioner | Admitting: Nurse Practitioner

## 2023-06-25 DIAGNOSIS — I428 Other cardiomyopathies: Secondary | ICD-10-CM | POA: Insufficient documentation

## 2023-06-25 DIAGNOSIS — I890 Lymphedema, not elsewhere classified: Secondary | ICD-10-CM | POA: Insufficient documentation

## 2023-06-25 DIAGNOSIS — Z9229 Personal history of other drug therapy: Secondary | ICD-10-CM | POA: Insufficient documentation

## 2023-06-25 DIAGNOSIS — I1A0 Resistant hypertension: Secondary | ICD-10-CM | POA: Insufficient documentation

## 2023-06-25 DIAGNOSIS — N186 End stage renal disease: Secondary | ICD-10-CM | POA: Insufficient documentation

## 2023-06-25 DIAGNOSIS — I5022 Chronic systolic (congestive) heart failure: Secondary | ICD-10-CM | POA: Insufficient documentation

## 2023-06-25 DIAGNOSIS — I480 Paroxysmal atrial fibrillation: Secondary | ICD-10-CM | POA: Insufficient documentation

## 2023-06-25 NOTE — Progress Notes (Deleted)
  Cardiology Office Note:  .   Date:  06/25/2023  ID:  Patricia Wu, DOB March 10, 1956, MRN [id number] PCP: Shade Flood, MD  [facility] Providers Cardiologist:  Marjo Bicker, MD Cardiology APP:  Sharlene Dory, NP { Click to update primary MD,subspecialty MD or APP then REFRESH:1}   History of Present Illness: .   Patricia Wu is a 67 y.o. female with a PMH of HFmrEF, HTN, HLD, asthma, obesity, ESRD on HD, and A-fib, who presents today for follow-up.   Hospitalized 04/2023 for weight gain, diagnosed with ESRD, had temporary dialysis, started on HD. Hospital course noted by some A-fib with RVR.  Last saw patient on May 28, 2023. Pt noted elevated BP's. Weight was down. Left arm AV fistula inserted by Dr. Arbie Cookey on June 18, 2023.   Today she presents for follow-up. She states...    Update limited Echo?    ROS: ***  Studies Reviewed: .        *** Risk Assessment/Calculations:   {Does this patient have ATRIAL FIBRILLATION?:[phone]} No BP recorded.  {Refresh Note OR Click here to enter BP  :1}***       Physical Exam:   VS:  There were no vitals taken for this visit.   Wt Readings from Last 3 Encounters:  05/28/23 197 lb 3.2 oz (89.4 kg)  05/14/23 203 lb 11.3 oz (92.4 kg)  05/07/23 233 lb 3.2 oz (105.8 kg)    GEN: Well nourished, well developed in no acute distress NECK: No JVD; No carotid bruits CARDIAC: ***RRR, no murmurs, rubs, gallops RESPIRATORY:  Clear to auscultation without rales, wheezing or rhonchi  ABDOMEN: Soft, non-tender, non-distended EXTREMITIES:  No edema; No deformity   ASSESSMENT AND PLAN: .   ***    {Are you ordering a CV Procedure (e.g. stress test, cath, DCCV, TEE, etc)?   Press F2        :[id number]}  Dispo: ***  Signed, Sharlene Dory, NP

## 2023-06-25 NOTE — Telephone Encounter (Signed)
Last office visit 04/03/2023 Upcoming appt 07/03/2023  Per hx on this RX does not look like Dr.Greene mgnt this. Please advise

## 2023-06-25 NOTE — Telephone Encounter (Signed)
Called patient to discuss prescriber she could not tell me the name on the bottle I called the pharmacy and they told me this is Rx by Dr Henri Medal and I did call patient back and advise of this so she can follow up with them

## 2023-06-25 NOTE — Telephone Encounter (Signed)
Encourage patient to contact the pharmacy for refills or they can request refills through [facility]  WHAT PHARMACY WOULD THEY LIKE THIS SENT TO:  [id number] - [place], [place] - [address] AT [address]    MEDICATION NAME & DOSE: calcitRIOL (ROCALTROL) 0.25 MCG capsule   NOTES/COMMENTS FROM PATIENT:      Front office please notify patient: It takes 48-72 hours to process rx refill requests Ask patient to call pharmacy to ensure rx is ready before heading there.

## 2023-07-01 DIAGNOSIS — Z992 Dependence on renal dialysis: Secondary | ICD-10-CM | POA: Diagnosis not present

## 2023-07-01 DIAGNOSIS — N186 End stage renal disease: Secondary | ICD-10-CM | POA: Diagnosis not present

## 2023-07-03 ENCOUNTER — Ambulatory Visit: Payer: No Typology Code available for payment source | Admitting: Family Medicine

## 2023-07-05 ENCOUNTER — Other Ambulatory Visit: Payer: Self-pay | Admitting: *Deleted

## 2023-07-05 DIAGNOSIS — N184 Chronic kidney disease, stage 4 (severe): Secondary | ICD-10-CM

## 2023-07-10 ENCOUNTER — Other Ambulatory Visit: Payer: Self-pay

## 2023-07-17 ENCOUNTER — Ambulatory Visit: Payer: No Typology Code available for payment source | Admitting: Family Medicine

## 2023-07-18 ENCOUNTER — Ambulatory Visit (INDEPENDENT_AMBULATORY_CARE_PROVIDER_SITE_OTHER): Payer: No Typology Code available for payment source | Admitting: Family Medicine

## 2023-07-18 ENCOUNTER — Encounter: Payer: Self-pay | Admitting: Family Medicine

## 2023-07-18 VITALS — BP 132/70 | HR 92 | Temp 97.8°F | Ht 61.0 in | Wt 186.4 lb

## 2023-07-18 DIAGNOSIS — N186 End stage renal disease: Secondary | ICD-10-CM

## 2023-07-18 DIAGNOSIS — J4520 Mild intermittent asthma, uncomplicated: Secondary | ICD-10-CM | POA: Diagnosis not present

## 2023-07-18 DIAGNOSIS — J309 Allergic rhinitis, unspecified: Secondary | ICD-10-CM | POA: Diagnosis not present

## 2023-07-18 DIAGNOSIS — Z992 Dependence on renal dialysis: Secondary | ICD-10-CM | POA: Diagnosis not present

## 2023-07-18 DIAGNOSIS — E785 Hyperlipidemia, unspecified: Secondary | ICD-10-CM

## 2023-07-18 MED ORDER — ALBUTEROL SULFATE HFA 108 (90 BASE) MCG/ACT IN AERS
1.0000 | INHALATION_SPRAY | Freq: Four times a day (QID) | RESPIRATORY_TRACT | 0 refills | Status: DC | PRN
Start: 2023-07-18 — End: 2024-07-31

## 2023-07-18 MED ORDER — ATORVASTATIN CALCIUM 10 MG PO TABS: ORAL_TABLET | ORAL | 2 refills | Status: DC

## 2023-07-18 NOTE — Progress Notes (Signed)
Subjective:  Patient ID: Patricia Wu, female    DOB: 22-Sep-1956  Age: 67 y.o. MRN: [id number]  CC:  Chief Complaint  Patient presents with   Medical Management of Chronic Issues    HPI Patricia Wu presents for   Hx of Stage IV chronic kidney disease -  Left arm AV fistula placed by Dr. Arbie Cookey 06/18/2023. On hemodialysis MWF past few weeks. Fluid in legs is better.  Followed by nephrology, Dr. Kathrene Bongo.   Atrial fibrillation Discussed in April.  On beta-blocker for rate control,  anticoagulation - now on Coumadin.  Followed by cardiology. No new bleeding, palpitations.   Hyperlipidemia: Treated with Lipitor 10 mg daily - no new myalgias/side effects.  Lab Results  Component Value Date   CHOL 187 01/16/2023   HDL 49.50 01/16/2023   LDLCALC 121 (H) 01/16/2023   TRIG 83.0 01/16/2023   CHOLHDL 4 01/16/2023   Lab Results  Component Value Date   ALT 50 (H) 05/07/2023   AST 28 05/07/2023   GGT 247 (H) 10/11/2020   ALKPHOS 320 (H) 05/07/2023   BILITOT 0.7 05/07/2023   Allergic rhinitis with bronchospasm Has albuterol as needed.  RTC precautions discussed previously if frequent need for albuterol.  Flonase for allergies. No recent need for flonase or albuterol - doing well.    History Patient Active Problem List   Diagnosis Date Noted   Class 2 obesity due to excess calories with body mass index (BMI) of 39.0 to 39.9 in adult 05/10/2023   Acute on chronic combined systolic and diastolic CHF (congestive heart failure) (HCC) 05/09/2023   Hypervolemia associated with renal insufficiency 05/07/2023   ESRD needing dialysis (HCC) 05/07/2023   History of Coumadin therapy 03/26/2023   Atrial fibrillation, controlled (HCC) 03/04/2023   Asthma, chronic 03/04/2023   Chronic kidney disease, stage IV (severe) (HCC) 02/18/2023   Anemia of chronic renal failure 02/18/2023   Acute-on-chronic kidney injury (HCC) 02/15/2022   Hyperlipidemia 02/15/2022   Obesity (BMI 30-39.9)  02/15/2022   Colon cancer screening 09/21/2020   Elevated alkaline phosphatase level 09/21/2020   Essential hypertension 02/18/2018   Elevated brain natriuretic peptide (BNP) level 02/18/2018   CKD (chronic kidney disease) stage 3, GFR 30-59 ml/min (HCC) 02/18/2018   Past Medical History:  Diagnosis Date   CKD (chronic kidney disease)    Hypercholesteremia    Hypertension    Noncompliance with medication regimen    Past Surgical History:  Procedure Laterality Date   AV FISTULA PLACEMENT Left 06/18/2023   Procedure: INSERTION OF LEFT ARM ARTERIOVENOUS (AV) FISTULA;  Surgeon: Larina Earthly, MD;  Location: [facility];  Service: Vascular;  Laterality: Left;   COLONOSCOPY WITH PROPOFOL N/A 10/17/2020   Procedure: COLONOSCOPY WITH PROPOFOL;  Surgeon: Lanelle Bal, DO;  Location: [facility];  Service: Endoscopy;  Laterality: N/A;  1:00pm   IR FLUORO GUIDE CV LINE RIGHT  05/09/2023   IR US GUIDE VASC ACCESS RIGHT  05/10/2023   POLYPECTOMY  10/17/2020   Procedure: POLYPECTOMY;  Surgeon: Lanelle Bal, DO;  Location: [facility];  Service: Endoscopy;;   Allergies  Allergen Reactions   Ibuprofen Other (See Comments)    pcp said raises her BP   Prior to Admission medications   Medication Sig Start Date End Date Taking? Authorizing Provider  acetaminophen (TYLENOL) 325 MG tablet Take 325-650 mg by mouth every 6 (six) hours as needed (pain.).    Yes [provider]  albuterol (VENTOLIN HFA) 108 (90  Base) MCG/ACT inhaler Inhale 1-2 puffs into the lungs every 6 (six) hours as needed for wheezing or shortness of breath. 01/04/23  Yes Shade Flood, MD  atorvastatin (LIPITOR) 10 MG tablet TAKE 1 TABLET(10 MG) BY MOUTH DAILY Patient taking differently: Take 10 mg by mouth daily. 01/16/23  Yes Shade Flood, MD  calcitRIOL (ROCALTROL) 0.25 MCG capsule Take 0.5 mcg by mouth daily.   Yes [provider]  furosemide (LASIX) 40 MG tablet Take 40 mg by mouth daily. 07/12/23   Yes [provider]  metoprolol tartrate (LOPRESSOR) 100 MG tablet TAKE 1 TABLET BY MOUTH TWICE DAILY, TAKE WITH 25 MG TABLET TWICE DAILY TO EQUAL 125 MG TWICE DAILY 05/28/23  Yes Sharlene Dory, NP  metoprolol tartrate (LOPRESSOR) 25 MG tablet TAKE 1 TABLET BY MOUTH TWICE DAILY. TAKE WITH 100MG  TWICE DAILY TO EQUAL 125MG  TWICE DAILY 05/28/23  Yes Sharlene Dory, NP  oxyCODONE-acetaminophen (PERCOCET) 5-325 MG tablet Take 1 tablet by mouth every 6 (six) hours as needed for severe pain. 06/18/23  Yes Early, Kristen Loader, MD  warfarin (COUMADIN) 2.5 MG tablet Take 1 tablet daily or as directed by [facility] Clinic 04/04/23  Yes Mallipeddi, Orion Modest, MD   Social History   Socioeconomic History   Marital status: Married    Spouse name: Not on file   Number of children: 2   Years of education: Not on file   Highest education level: Not on file  Occupational History   Not on file  Tobacco Use   Smoking status: Never    Passive exposure: Never   Smokeless tobacco: Never  Vaping Use   Vaping status: Never Used  Substance and Sexual Activity   Alcohol use: No   Drug use: No   Sexual activity: Yes  Other Topics Concern   Not on file  Social History Narrative   Not on file   Social Determinants of Health   Financial Resource Strain: Low Risk  (11/28/2022)   Overall Financial Resource Strain (CARDIA)    Difficulty of Paying Living Expenses: Not hard at all  Food Insecurity: No Food Insecurity (05/07/2023)   Hunger Vital Sign    Worried About Running Out of Food in the Last Year: Never true    Ran Out of Food in the Last Year: Never true  Transportation Needs: No Transportation Needs (05/07/2023)   PRAPARE - Administrator, Civil Service (Medical): No    Lack of Transportation (Non-Medical): No  Physical Activity: Insufficiently Active (11/28/2022)   Exercise Vital Sign    Days of Exercise per Week: 3 days    Minutes of Exercise per Session: 30 min  Stress: No Stress Concern  Present (11/28/2022)   [employer] of Occupational Health - Occupational Stress Questionnaire    Feeling of Stress : Not at all  Social Connections: Socially Integrated (11/28/2022)   Social Connection and Isolation Panel [NHANES]    Frequency of Communication with Friends and Family: Three times a week    Frequency of Social Gatherings with Friends and Family: Once a week    Attends Religious Services: More than 4 times per year    Active Member of [employer] or Organizations: Yes    Attends Engineer, structural: More than 4 times per year    Marital Status: Married  Catering manager Violence: Not At Risk (05/07/2023)   Humiliation, Afraid, Rape, and Kick questionnaire    Fear of Current or Ex-Partner: No    Emotionally Abused:  No    Physically Abused: No    Sexually Abused: No    Review of Systems Per HPI  Objective:   Vitals:   07/18/23 1343  BP: 132/70  Pulse: 92  Temp: 97.8 F (36.6 C)  TempSrc: Temporal  SpO2: 100%  Weight: 186 lb 6.4 oz (84.6 kg)  Height: 5\' 1"  (1.549 m)     Physical Exam Vitals reviewed.  Constitutional:      Appearance: Normal appearance. She is well-developed.  HENT:     Head: Normocephalic and atraumatic.  Eyes:     Conjunctiva/sclera: Conjunctivae normal.     Pupils: Pupils are equal, round, and reactive to light.  Neck:     Vascular: No carotid bruit.  Cardiovascular:     Rate and Rhythm: Normal rate and regular rhythm.     Heart sounds: Normal heart sounds.  Pulmonary:     Effort: Pulmonary effort is normal.     Breath sounds: Normal breath sounds.  Abdominal:     Palpations: Abdomen is soft. There is no pulsatile mass.     Tenderness: There is no abdominal tenderness.  Musculoskeletal:     Right lower leg: No edema.     Left lower leg: No edema.  Skin:    General: Skin is warm and dry.  Neurological:     Mental Status: She is alert and oriented to person, place, and time.  Psychiatric:        Mood and Affect: Mood  normal.        Behavior: Behavior normal.        Assessment & Plan:  Patricia Wu is a 67 y.o. female . ESRD on hemodialysis (HCC)  -3 times weekly hemodialysis, continue follow-up with nephrologist as planned.  Hyperlipidemia, unspecified hyperlipidemia type - Plan: Hepatic function panel, Lipid panel, atorvastatin (LIPITOR) 10 MG tablet  -Tolerating Lipitor, check updated labs and adjust plan accordingly  Allergic rhinitis, unspecified seasonality, unspecified trigger Mild intermittent chronic asthma without complication - Plan: albuterol (VENTOLIN HFA) 108 (90 Base) MCG/ACT inhaler  -allergies stable - flonase as needed, asthma stable without recent need for albuterol.  Option to use if needed for flare of symptoms with RTC precautions if frequent or increased need.  Meds ordered this encounter  Medications   albuterol (VENTOLIN HFA) 108 (90 Base) MCG/ACT inhaler    Sig: Inhale 1-2 puffs into the lungs every 6 (six) hours as needed for wheezing or shortness of breath.    Dispense:  8 g    Refill:  0   atorvastatin (LIPITOR) 10 MG tablet    Sig: TAKE 1 TABLET(10 MG) BY MOUTH DAILY    Dispense:  90 tablet    Refill:  2   Patient Instructions  No change in meds for me at this time.  Keep follow-up with specialist as planned.  You should be obtaining routine lab work with your nephrologist but I will check liver tests and cholesterol levels today.  If any concerns I will let you know.  Follow-up in 6 months.  Flonase as needed for allergies, albuterol if you do have any breakthrough wheeze or asthma symptoms but if you require that more than 2 times per week, let me know.  Take care!    Signed,   Patricia Staggers, MD [facility], [facility] 07/18/23 2:41 PM

## 2023-07-18 NOTE — Patient Instructions (Signed)
No change in meds for me at this time.  Keep follow-up with specialist as planned.  You should be obtaining routine lab work with your nephrologist but I will check liver tests and cholesterol levels today.  If any concerns I will let you know.  Follow-up in 6 months.  Flonase as needed for allergies, albuterol if you do have any breakthrough wheeze or asthma symptoms but if you require that more than 2 times per week, let me know.  Take care!

## 2023-07-19 LAB — HEPATIC FUNCTION PANEL
Alkaline Phosphatase: 285 U/L — ABNORMAL HIGH (ref 39–117)
Bilirubin, Direct: 0.2 mg/dL (ref 0.0–0.3)

## 2023-07-24 DIAGNOSIS — N186 End stage renal disease: Secondary | ICD-10-CM | POA: Diagnosis not present

## 2023-07-24 DIAGNOSIS — Z992 Dependence on renal dialysis: Secondary | ICD-10-CM | POA: Diagnosis not present

## 2023-07-25 ENCOUNTER — Ambulatory Visit (HOSPITAL_COMMUNITY)
Admission: RE | Admit: 2023-07-25 | Discharge: 2023-07-25 | Disposition: A | Discharge status: Home / Self Care | Payer: No Typology Code available for payment source | Source: Ambulatory Visit | Attending: Vascular Surgery | Admitting: Vascular Surgery

## 2023-07-25 ENCOUNTER — Encounter: Payer: Self-pay | Admitting: Physician Assistant

## 2023-07-25 ENCOUNTER — Ambulatory Visit (INDEPENDENT_AMBULATORY_CARE_PROVIDER_SITE_OTHER): Payer: No Typology Code available for payment source | Admitting: Physician Assistant

## 2023-07-25 ENCOUNTER — Telehealth: Payer: Self-pay

## 2023-07-25 VITALS — BP 121/83 | HR 91 | Temp 97.6°F | Resp 16 | Ht 61.0 in | Wt 202.0 lb

## 2023-07-25 DIAGNOSIS — N186 End stage renal disease: Secondary | ICD-10-CM

## 2023-07-25 DIAGNOSIS — N184 Chronic kidney disease, stage 4 (severe): Secondary | ICD-10-CM | POA: Diagnosis not present

## 2023-07-25 DIAGNOSIS — Z992 Dependence on renal dialysis: Secondary | ICD-10-CM

## 2023-07-25 NOTE — Progress Notes (Signed)
POST OPERATIVE OFFICE NOTE    CC:  F/u for surgery  HPI:  Patricia Wu is a 67 y.o. female who is s/p creation of left brachiocephalic fistula creation on 06/18/2023 by Dr. Arbie Cookey.  She gets dialysis on Mondays, Wednesdays, and Fridays via right IJ TDC.  She returns today for a postop visit.  She denies any issues with her incision site such as drainage, erythema, or pain.  She denies any symptoms of steal such as left hand pain, weakness, or excessive coldness.   Allergies  Allergen Reactions   Ibuprofen Other (See Comments)    pcp said raises her BP    Current Outpatient Medications  Medication Sig Dispense Refill   acetaminophen (TYLENOL) 325 MG tablet Take 325-650 mg by mouth every 6 (six) hours as needed (pain.).      albuterol (VENTOLIN HFA) 108 (90 Base) MCG/ACT inhaler Inhale 1-2 puffs into the lungs every 6 (six) hours as needed for wheezing or shortness of breath. 8 g 0   atorvastatin (LIPITOR) 10 MG tablet TAKE 1 TABLET(10 MG) BY MOUTH DAILY 90 tablet 2   calcitRIOL (ROCALTROL) 0.25 MCG capsule Take 0.5 mcg by mouth daily.     furosemide (LASIX) 40 MG tablet Take 40 mg by mouth daily.     metoprolol tartrate (LOPRESSOR) 100 MG tablet TAKE 1 TABLET BY MOUTH TWICE DAILY, TAKE WITH 25 MG TABLET TWICE DAILY TO EQUAL 125 MG TWICE DAILY 180 tablet 1   metoprolol tartrate (LOPRESSOR) 25 MG tablet TAKE 1 TABLET BY MOUTH TWICE DAILY. TAKE WITH 100MG  TWICE DAILY TO EQUAL 125MG  TWICE DAILY 180 tablet 1   oxyCODONE-acetaminophen (PERCOCET) 5-325 MG tablet Take 1 tablet by mouth every 6 (six) hours as needed for severe pain. 8 tablet 0   warfarin (COUMADIN) 2.5 MG tablet Take 1 tablet daily or as directed by Coumadin Clinic 45 tablet 3   No current facility-administered medications for this visit.     ROS:  See HPI  Physical Exam:   Incision: Left upper arm incision well-healed without signs of infection Extremities: Left brachiocephalic fistula with good thrill in the [facility] fossa,  then becomes pulsatile throughout the rest of the upper arm.  Easily palpable left radial pulse Neuro: Intact motor and sensation of left upper extremity  Dialysis Duplex (07/25/2023) AVF                 PSV (cm/s)Flow Vol (mL/min)Comments  +--------------------+----------+-----------------+--------+  Native artery inflow   138           701                 +--------------------+----------+-----------------+--------+  AVF Anastomosis        473                               +--------------------+----------+-----------------+--------+     +------------+---------+------------+----------+---------------------------  ----+  OUTFLOW VEIN   PSV     Diameter  Depth (cm)           Describe                            (cm/s)      (cm)                                                +------------+---------+------------+----------+---------------------------  ----+  Shoulder      207       0.60       0.78     competing branch  measuring                                                            0.14cm                +------------+---------+------------+----------+---------------------------  ----+  Prox UA        188       0.52       0.54     competing branch  measuring                                                            0.20cm                +------------+---------+------------+----------+---------------------------  ----+  Mid UA         111       0.60       0.52                                     +------------+---------+------------+----------+---------------------------  ----+  Dist UA        132       0.59       0.41                                     +------------+---------+------------+----------+---------------------------  ----+  AC Fossa       845       0.44       0.57              stenotic           Assessment/Plan:  This is a 67 y.o. female who is s/p: Left brachiocephalic fistula creation on  12/26/7251  -She has been doing well since surgery.  She denies any issues with her incision such as erythema, drainage, or fevers.  On exam of her left upper arm incision is well-healed -She denies any symptoms of steal syndrome such as left hand pain, excessive coldness, or weakness. She has an easily palpable left radial pulse -Duplex demonstrates a patent left brachiocephalic fistula that is close to maturing. Flow volume of 701 ml/min.  The fistula is not quite mature enough in size yet.  There is also a very tight area of stenosis in the [facility] fossa with a PSV of 845 cm/s.  On exam the fistula is very pulsatile proximal to the area of stenosis in the [facility] fossa.  She would benefit from balloon angioplasty to this area to reduce the stenosis. -We will schedule the patient for left upper extremity fistulogram with possible balloon angioplasty in the next 2 to 3 weeks on a Tuesday or Thursday.  She may have to hold her warfarin before the procedure.   Loel Dubonnet, PA-C [facility] [phone]   Clinic  MD:  Edilia Bo

## 2023-07-25 NOTE — Telephone Encounter (Signed)
Attempted to reach pt to schedule surgery, called pt and daughter, no answer, lf vm on both.

## 2023-07-26 ENCOUNTER — Other Ambulatory Visit: Payer: Self-pay | Admitting: *Deleted

## 2023-07-26 ENCOUNTER — Encounter: Payer: Self-pay | Admitting: *Deleted

## 2023-07-26 DIAGNOSIS — N2581 Secondary hyperparathyroidism of renal origin: Secondary | ICD-10-CM | POA: Diagnosis not present

## 2023-07-26 DIAGNOSIS — D509 Iron deficiency anemia, unspecified: Secondary | ICD-10-CM | POA: Diagnosis not present

## 2023-07-26 DIAGNOSIS — N186 End stage renal disease: Secondary | ICD-10-CM | POA: Diagnosis not present

## 2023-07-26 DIAGNOSIS — D631 Anemia in chronic kidney disease: Secondary | ICD-10-CM | POA: Diagnosis not present

## 2023-07-26 DIAGNOSIS — E559 Vitamin D deficiency, unspecified: Secondary | ICD-10-CM | POA: Diagnosis not present

## 2023-07-26 DIAGNOSIS — Z992 Dependence on renal dialysis: Secondary | ICD-10-CM | POA: Diagnosis not present

## 2023-07-26 MED ORDER — SODIUM CHLORIDE 0.9% FLUSH
3.0000 mL | INTRAVENOUS | Status: DC | PRN
Start: 2023-07-26 — End: 2024-07-31

## 2023-07-26 MED ORDER — SODIUM CHLORIDE 0.9% FLUSH: 3.0000 mL | Freq: Two times a day (BID) | INTRAVENOUS | Status: DC

## 2023-07-26 MED ORDER — SODIUM CHLORIDE 0.9 % IV SOLN: 250.0000 mL | INTRAVENOUS | Status: DC | PRN

## 2023-08-05 ENCOUNTER — Encounter: Payer: No Typology Code available for payment source | Attending: Nurse Practitioner | Admitting: Nurse Practitioner

## 2023-08-05 ENCOUNTER — Encounter: Payer: Self-pay | Admitting: Nurse Practitioner

## 2023-08-05 VITALS — BP 132/80 | HR 93 | Ht 61.0 in | Wt 184.6 lb

## 2023-08-05 DIAGNOSIS — I1A0 Resistant hypertension: Secondary | ICD-10-CM | POA: Diagnosis not present

## 2023-08-05 DIAGNOSIS — I890 Lymphedema, not elsewhere classified: Secondary | ICD-10-CM | POA: Diagnosis not present

## 2023-08-05 DIAGNOSIS — I428 Other cardiomyopathies: Secondary | ICD-10-CM

## 2023-08-05 DIAGNOSIS — N186 End stage renal disease: Secondary | ICD-10-CM | POA: Diagnosis not present

## 2023-08-05 DIAGNOSIS — I480 Paroxysmal atrial fibrillation: Secondary | ICD-10-CM

## 2023-08-05 DIAGNOSIS — Z01810 Encounter for preprocedural cardiovascular examination: Secondary | ICD-10-CM | POA: Diagnosis not present

## 2023-08-05 DIAGNOSIS — I5022 Chronic systolic (congestive) heart failure: Secondary | ICD-10-CM

## 2023-08-05 MED ORDER — FUROSEMIDE 40 MG PO TABS: 40.0000 mg | ORAL_TABLET | ORAL | 2 refills | Status: DC | PRN

## 2023-08-05 NOTE — Patient Instructions (Addendum)
Medication Instructions:   Change your Lasix to as needed only  Continue all other medications.     Labwork:  none  Testing/Procedures:  none  Follow-Up:  3 months   Any Other Special Instructions Will Be Listed Below (If Applicable). Weight log  BP log Heart failure info   If you need a refill on your cardiac medications before your next appointment, please call your pharmacy.

## 2023-08-05 NOTE — Progress Notes (Signed)
Office Visit    Patient Name: Patricia Wu Date of Encounter: 08/05/2023 PCP:  Shade Flood, MD [facility]  Cardiologist:  Marjo Bicker, MD  Advanced Practice Provider:  Sharlene Dory, NP Electrophysiologist:  None   Chief Complaint and HPI    Patricia Wu is a 67 y.o. female with a hx of HFmrEF, HTN, HLD, asthma, obesity, ESRD on HD, and A-fib, who presents today for follow-up.   History of Present Illness    Patricia Wu is a 67 y.o. female with a PMH as mentioned above.  Previous patient of Dr. Purvis Sheffield.   Admitted 02/2023 with A-fib with RVR.  Was found to be be in A-fib with RVR at nephrologist office.  Cardiology was consulted for treatment.  Became stable after 2 doses of IV diltiazem.  Eliquis was initiated.  Heart rate stabilized on metoprolol 50 mg twice daily.  Hospital course complicated by AKI on CKD stage IV.  Baseline creatinine around 3.5.  Per patient's report, she is being evaluated for AV graft.  Nephrology was consulted, was told to follow-up outpatient.  Received cardiac monitor alert via live telemetry with conduction with run of V. tach around 15 beats with atrial fibrillation/a flutter.  RN called patient and stated she did not feel any symptoms at that time, was asymptomatic -see telephone note dated April 17, 2023.  Recent hospital admission 04/2023 for weight gain, diagnosed with ESRD, had temporary dialysis, started on hemodialysis.  Noted to have some A-fib with RVR.   I last saw her for outpatient follow-up on May 28, 2023. Was doing well. Admitted to elevated BP. Denied any chest pain, shortness of breath, palpitations, syncope, presyncope, dizziness, orthopnea, PND, acute bleeding, or claudication.   Today she presents for follow-up with her daughter. Weight is down from earlier this month. She has lost 18 lbs. Notes feeling fatigued. Denies any chest pain, shortness of breath, palpitations, syncope,  presyncope, dizziness, orthopnea, PND, swelling or significant weight changes, acute bleeding, or claudication. LE swelling has improved. She is pending A/V fistulagram of left arm later this month with [facility].   EKGs/Labs/Other Studies Reviewed:   The following studies were reviewed today:   EKG:  EKG is not ordered today.   Cardiac preventice 06/2023:   62% A-fib burden ranging from 80-158 bpm with an average HR 105 bpm.  Normal sinus rhythm 38% of the time with HR ranging from 53 to 158 bpm with an average HR 92 bpm.   10 runs of NSVT, longest lasting 15 beats.   No AV block or pauses. <1% PVC burden.   Patient triggered events correlated with atrial fibrillation.  Echo 02/2023:   1. Left ventricular ejection fraction, by estimation, is 40 to 45%. The  left ventricle has mildly decreased function. The left ventricle  demonstrates global hypokinesis. There is moderate left ventricular  hypertrophy. Left ventricular diastolic function   could not be evaluated.   2. Right ventricular systolic function is normal. The right ventricular  size is normal. There is normal pulmonary artery systolic pressure. The  estimated right ventricular systolic pressure is 28.0 mmHg.   3. Right atrial size was moderately dilated.   4. The mitral valve is normal in structure. Mild mitral valve  regurgitation. No evidence of mitral stenosis.   5. The aortic valve is tricuspid. There is mild calcification of the  aortic valve. Aortic valve regurgitation is not visualized. No aortic  stenosis is present.  6. The inferior vena cava is normal in size with greater than 50%  respiratory variability, suggesting right atrial pressure of 3 mmHg.   Comparison(s): No prior Echocardiogram.  Risk Assessment/Calculations:   CHA2DS2-VASc Score = 4  This indicates a 4.8% annual risk of stroke. The patient's score is based upon: CHF History: 1 HTN History: 1 Diabetes History: 0 Stroke History: 0 Vascular Disease  History: 0 Age Score: 1 Gender Score: 1   The 10-year ASCVD risk score (Arnett DK, et al., 2019) is: 7.4%   Values used to calculate the score:     Age: 72 years     Sex: Female     Is Non-Hispanic African American: Yes     Diabetic: No     Tobacco smoker: No     Systolic Blood Pressure: 132 mmHg     Is BP treated: Yes     HDL Cholesterol: 47.9 mg/dL     Total Cholesterol: 135 mg/dL  Review of Systems    All other systems reviewed and are otherwise negative except as noted above.  Physical Exam    VS:  BP 132/80   Pulse 93   Ht 5\' 1"  (1.549 m)   Wt 184 lb 9.6 oz (83.7 kg)   SpO2 98%   BMI 34.88 kg/m  , BMI Body mass index is 34.88 kg/m.  Wt Readings from Last 3 Encounters:  08/05/23 184 lb 9.6 oz (83.7 kg)  07/25/23 202 lb (91.6 kg)  07/18/23 186 lb 6.4 oz (84.6 kg)    GEN: Obese, 67 y.o. female in no acute distress. HEENT: normal. Neck: Supple, no JVD, carotid bruits, or masses. Cardiac: S1/S2, irregular rhythm and regular rate, no murmurs, rubs, or gallops. No clubbing, cyanosis. Nonpitting edema along BLE, Radials2+ /PT 1+ and equal bilaterally. Hemodialysis port located on patient's right upper anterior chest.  Respiratory:  Respirations regular and unlabored, clear to auscultation bilaterally. MS: No deformity or atrophy. Skin: Warm and dry, no rash. Neuro:  Strength and sensation are intact. Psych: Normal affect.  Assessment & Plan   HFmrEF, NICM, lymphedema Stage C, NYHA class II symptoms. Echo 02/2023 revealed EF 40-45%, most likely d/t tachy-mediated from A-fib with RVR. Lasix previously d/c and held d/t AKI on CKD. GDMT limited d/t kidney disease. Instructed to take Lasix 40 mg daily PRN for weight gain, shortness of breath, or leg edema. No medication changes at this time.  Recommended low salt, heart healthy diet encouraged. Significant improvement in lymphedema with dialysis.  ED precautions discussed. Consider updating limited Echo at next OV.    PAF,  long term anticoagulation Denies any tachycardia or palpitations. Noted to have some A-fib with RVR during recent hospitalization. Dr. Tenny Craw who was consulted stated, "The pt will need better rate control or, if pursue rhythm control, an antiarrhythmic.   Amiodarone is probably only option given other medical problems. She has been subtherapeutic on coumadin  Would need 4 wks before considering dosing to convert. I do not think TEE/DCCV would be beneficial as she will probably revert to afib. Heart rate well controlled. Preventice cardiac monitor revealed predominantly A-fib with episodes of A-flutter, pt triggered events correlated to A-fib. Asymptomatic at this time, HR controlled with A-fib. Continue metoprolol and follow-up at coumadin clinic as scheduled, may need to consider amiodarone dosing in future if needed.    ESRD on HD  Continues on HD - MWF scheduled. Avoid nephrotoxic agents.  Encourage adequate hydration.  Will make Lasix PRN at this time  as mentioned above. No medication changes at this time.  Continue follow-up with nephrology and PCP.   Resistant HTN BP stable. Goal SBP < 140. Continue Lopressor. GDMT limited at this time d/t her ESRD. Follow-up with Nephrology and appreciate recs. Given BP log and salty six and discussed to monitor BP at home at least 2 hours after medications and sitting for 5-10 minutes.  If no improvement by follow-up, plan to start hydralazine, refer to advanced hypertension clinic /consult clinical pharmD.  5. Pre-operative cardiovascular risk assessment Ms. Didonato's perioperative risk of a major cardiac event is 6.6% according to the Revised Cardiac Risk Index (RCRI).  Therefore, she is at high risk for perioperative complications.   Her functional capacity is good at   METs according to the Duke Activity Status Index (DASI). Recommendations: According to ACC/AHA guidelines, no further cardiovascular testing needed.  The patient may proceed to surgery at  acceptable risk.   Antiplatelet and/or Anticoagulation Recommendations: Will route note to clinical pharm D to comment on Coumadin and then will route to requesting party and let patient know.   Disposition: Follow up in 3 months with Vishnu P Mallipeddi, MD or APP.  Signed, Sharlene Dory, NP 08/05/2023, 2:52 PM [facility]

## 2023-08-05 NOTE — Addendum Note (Signed)
Addended by: Leonides Schanz C on: 08/05/2023 04:05 PM   Modules accepted: Orders

## 2023-08-08 NOTE — Progress Notes (Signed)
Patient with diagnosis of atrial fibrillation on warfarin for anticoagulation.    Procedure: fistulagram Date of procedure: 08/15/23   CHA2DS2-VASc Score = 4  This indicates a 4.8% annual risk of stroke. The patient's score is based upon: CHF History: 1 HTN History: 1 Diabetes History: 0 Stroke History: 0 Vascular Disease History: 0 Age Score: 1 Gender Score: 1   CrCl 13 Platelet count 152  Per office protocol, patient can hold warfarin for 5 days prior to procedure.   Patient will not need bridging with Lovenox (enoxaparin) around procedure.  Has been monitored by Vashti Hey RN at [facility] office.  Last seen several months ago.    **This guidance is not considered finalized until pre-operative APP has relayed final recommendations.**

## 2023-08-09 ENCOUNTER — Encounter: Payer: Self-pay | Admitting: *Deleted

## 2023-08-09 NOTE — Progress Notes (Signed)
Attempted to reach patient but mailbox was full.  Will send message via mychart.

## 2023-08-12 DIAGNOSIS — N186 End stage renal disease: Secondary | ICD-10-CM | POA: Diagnosis not present

## 2023-08-12 DIAGNOSIS — Z992 Dependence on renal dialysis: Secondary | ICD-10-CM | POA: Diagnosis not present

## 2023-08-15 ENCOUNTER — Ambulatory Visit (HOSPITAL_COMMUNITY)
Admission: RE | Admit: 2023-08-15 | Discharge: 2023-08-15 | Disposition: A | Discharge status: Home / Self Care | Payer: No Typology Code available for payment source | Attending: Vascular Surgery | Admitting: Vascular Surgery

## 2023-08-15 ENCOUNTER — Encounter (HOSPITAL_COMMUNITY)
Admission: RE | Disposition: A | Discharge status: Home / Self Care | Payer: Self-pay | Source: Home / Self Care | Attending: Vascular Surgery

## 2023-08-15 DIAGNOSIS — Z992 Dependence on renal dialysis: Secondary | ICD-10-CM | POA: Insufficient documentation

## 2023-08-15 DIAGNOSIS — T82898A Other specified complication of vascular prosthetic devices, implants and grafts, initial encounter: Secondary | ICD-10-CM | POA: Diagnosis not present

## 2023-08-15 DIAGNOSIS — N186 End stage renal disease: Secondary | ICD-10-CM | POA: Diagnosis not present

## 2023-08-15 HISTORY — PX: A/V FISTULAGRAM: CATH118298

## 2023-08-15 LAB — POCT I-STAT, CHEM 8
BUN: 36 mg/dL — ABNORMAL HIGH (ref 8–23)
Calcium, Ion: 1.06 mmol/L — ABNORMAL LOW (ref 1.15–1.40)
Chloride: 100 mmol/L (ref 98–111)
Creatinine, Ser: 5.3 mg/dL — ABNORMAL HIGH (ref 0.44–1.00)
Glucose, Bld: 98 mg/dL (ref 70–99)
HCT: 41 % (ref 36.0–46.0)
Hemoglobin: 13.9 g/dL (ref 12.0–15.0)
Potassium: 3.5 mmol/L (ref 3.5–5.1)
Sodium: 140 mmol/L (ref 135–145)
TCO2: 26 mmol/L (ref 22–32)

## 2023-08-15 LAB — PROTIME-INR
INR: 1.1 (ref 0.8–1.2)
Prothrombin Time: 14.5 seconds (ref 11.4–15.2)

## 2023-08-15 SURGERY — A/V FISTULAGRAM
Anesthesia: LOCAL | Laterality: Left

## 2023-08-15 MED ORDER — LIDOCAINE HCL (PF) 1 % IJ SOLN
INTRAMUSCULAR | Status: AC
  Filled 2023-08-15: qty 30

## 2023-08-15 MED ORDER — LIDOCAINE HCL (PF) 1 % IJ SOLN
INTRAMUSCULAR | Status: DC | PRN
  Administered 2023-08-15: 2 mL

## 2023-08-15 MED ORDER — IODIXANOL 320 MG/ML IV SOLN
INTRAVENOUS | Status: DC | PRN
  Administered 2023-08-15: 40 mL

## 2023-08-15 MED ORDER — LABETALOL HCL 5 MG/ML IV SOLN: 10.0000 mg | INTRAVENOUS | Status: DC | PRN

## 2023-08-15 MED ORDER — HEPARIN SODIUM (PORCINE) 1000 UNIT/ML IJ SOLN
INTRAMUSCULAR | Status: AC
  Filled 2023-08-15: qty 10

## 2023-08-15 MED ORDER — HEPARIN SODIUM (PORCINE) 1000 UNIT/ML IJ SOLN
INTRAMUSCULAR | Status: DC | PRN
  Administered 2023-08-15: 5000 [IU] via INTRAVENOUS

## 2023-08-15 MED ORDER — SODIUM CHLORIDE 0.9% FLUSH: 3.0000 mL | Freq: Two times a day (BID) | INTRAVENOUS | Status: DC

## 2023-08-15 MED ORDER — SODIUM CHLORIDE 0.9% FLUSH: 3.0000 mL | INTRAVENOUS | Status: DC | PRN

## 2023-08-15 MED ORDER — ACETAMINOPHEN 325 MG PO TABS: 650.0000 mg | ORAL_TABLET | ORAL | Status: DC | PRN

## 2023-08-15 MED ORDER — HYDRALAZINE HCL 20 MG/ML IJ SOLN: 5.0000 mg | INTRAMUSCULAR | Status: DC | PRN

## 2023-08-15 MED ORDER — HEPARIN (PORCINE) IN NACL 1000-0.9 UT/500ML-% IV SOLN
INTRAVENOUS | Status: DC | PRN
  Administered 2023-08-15: 500 mL

## 2023-08-15 MED ORDER — SODIUM CHLORIDE 0.9 % IV SOLN: 250.0000 mL | INTRAVENOUS | Status: DC | PRN

## 2023-08-15 MED ORDER — ONDANSETRON HCL 4 MG/2ML IJ SOLN: 4.0000 mg | Freq: Four times a day (QID) | INTRAMUSCULAR | Status: DC | PRN

## 2023-08-15 SURGICAL SUPPLY — 11 items
CATH ANGIO 5F BER2 65CM (CATHETERS) IMPLANT
COVER DOME SNAP 22 D (MISCELLANEOUS) ×1 IMPLANT
DEVICE RAD COMP TR BAND LRG (VASCULAR PRODUCTS) IMPLANT
GLIDESHEATH SLEND SS 6F .021 (SHEATH) IMPLANT
KIT MICROPUNCTURE NIT STIFF (SHEATH) IMPLANT
KIT SYRINGE INJ CVI SPIKEX1 (MISCELLANEOUS) IMPLANT
PACK CARDIAC CATHETERIZATION (CUSTOM PROCEDURE TRAY) IMPLANT
SET ATX-X65L (MISCELLANEOUS) IMPLANT
SHEATH PROBE COVER 6X72 (BAG) ×1 IMPLANT
STOPCOCK MORSE 400PSI 3WAY (MISCELLANEOUS) ×1 IMPLANT
WIRE STARTER BENTSON 035X150 (WIRE) IMPLANT

## 2023-08-15 NOTE — Discharge Instructions (Signed)

## 2023-08-15 NOTE — Op Note (Signed)
    OPERATIVE NOTE  DATE: August 15, 2023  PROCEDURE: left radial artery cannulation under ultrasound guidance left arm fistulogram including central venogram  PRE-OPERATIVE DIAGNOSIS: Concern for stenosis left AC fossa in brachiocephalic AV fistula  POST-OPERATIVE DIAGNOSIS: same as above   SURGEON: Cephus Shelling, MD  ANESTHESIA: local  ESTIMATED BLOOD LOSS: 5 cc  FINDING(S): Ultrasound-guided access left radial artery.  This was given concern for stenosis at the AVF anastomosis so this could be adequately treated.  Fistulogram showed widely patent patent brachiocephalic anastomosis.  The left cephalic vein fistula in the upper arm is widely patent.  No central stenosis.  No intervention performed.  Discussed Patricia Wu can use the fistula at the end of September after 3 months of maturation.  SPECIMEN(S):  None  CONTRAST: 40 mL  INDICATIONS: Patricia Wu is a 67 y.o. female who is s/p left brachiocephalic AV fistula created on 06/18/2023 by Dr. Arbie Cookey.  There is concern for flow-limiting stenosis in the fistula adjacent to the arterial anastomosis at the [facility] fossa based on recent duplex in the office.  The patient is scheduled for left arm fistulogram.  The patient is aware the risks include but are not limited to: bleeding, infection, thrombosis of the cannulated access, and possible anaphylactic reaction to the contrast.  The patient is aware of the risks of the procedure and elects to proceed forward.  DESCRIPTION: After full informed written consent was obtained, the patient was brought back to the angiography suite and placed supine upon the angiography table.  The patient was connected to monitoring equipment.  The left arm was prepped and draped in the standard fashion for a left arm fistulogram.  Under ultrasound guidance, left radial artery was evaluated, it was patent, an image was saved.  This was accessed with a micro access needle and placed a microwire and then a  slender sheath.  I then advanced a Bentson wire and a BER 2 catheter into the brachial artery.  The patient was given 5000 units of IV heparin.  We then did hand injections to evaluate the fistula anastomosis to the brachial artery as well as the fistula left upper arm and the central veins.  No obvious stenosis was identified and no intervention performed.  A slender sheath was then removed after we decided there was no mechanical issue with the fistula.  TR band was applied at the wrist after the sheath was removed. Taken to holding in stable condition.  COMPLICATIONS: None  CONDITION: Stable  Cephus Shelling, MD [facility] Office: [phone]  Cephus Shelling   08/15/2023 8:22 AM

## 2023-08-15 NOTE — H&P (Signed)
History and Physical Interval Note:  08/15/2023 8:00 AM  Patricia Wu  has presented today for surgery, with the diagnosis of End stage renal.  The various methods of treatment have been discussed with the patient and family. After consideration of risks, benefits and other options for treatment, the patient has consented to  Procedure(s): A/V Fistulagram (Left) as a surgical intervention.  The patient's history has been reviewed, patient examined, no change in status, stable for surgery.  I have reviewed the patient's chart and labs.  Questions were answered to the patient's satisfaction.    Left arm fistulogram  Cephus Shelling   POST OPERATIVE OFFICE NOTE       CC:  F/u for surgery   HPI:  Patricia Wu is a 67 y.o. female who is s/p creation of left brachiocephalic fistula creation on 06/18/2023 by Dr. Arbie Cookey.  She gets dialysis on Mondays, Wednesdays, and Fridays via right IJ TDC.   She returns today for a postop visit.  She denies any issues with her incision site such as drainage, erythema, or pain.  She denies any symptoms of steal such as left hand pain, weakness, or excessive coldness.     Allergies       Allergies  Allergen Reactions   Ibuprofen Other (See Comments)      pcp said raises her BP              Current Outpatient Medications  Medication Sig Dispense Refill   acetaminophen (TYLENOL) 325 MG tablet Take 325-650 mg by mouth every 6 (six) hours as needed (pain.).        albuterol (VENTOLIN HFA) 108 (90 Base) MCG/ACT inhaler Inhale 1-2 puffs into the lungs every 6 (six) hours as needed for wheezing or shortness of breath. 8 g 0   atorvastatin (LIPITOR) 10 MG tablet TAKE 1 TABLET(10 MG) BY MOUTH DAILY 90 tablet 2   calcitRIOL (ROCALTROL) 0.25 MCG capsule Take 0.5 mcg by mouth daily.       furosemide (LASIX) 40 MG tablet Take 40 mg by mouth daily.       metoprolol tartrate (LOPRESSOR) 100 MG tablet TAKE 1 TABLET BY MOUTH TWICE DAILY, TAKE WITH 25 MG TABLET  TWICE DAILY TO EQUAL 125 MG TWICE DAILY 180 tablet 1   metoprolol tartrate (LOPRESSOR) 25 MG tablet TAKE 1 TABLET BY MOUTH TWICE DAILY. TAKE WITH 100MG  TWICE DAILY TO EQUAL 125MG  TWICE DAILY 180 tablet 1   oxyCODONE-acetaminophen (PERCOCET) 5-325 MG tablet Take 1 tablet by mouth every 6 (six) hours as needed for severe pain. 8 tablet 0   warfarin (COUMADIN) 2.5 MG tablet Take 1 tablet daily or as directed by Coumadin Clinic 45 tablet 3      No current facility-administered medications for this visit.         ROS:  See HPI   Physical Exam:     Incision: Left upper arm incision well-healed without signs of infection Extremities: Left brachiocephalic fistula with good thrill in the [facility] fossa, then becomes pulsatile throughout the rest of the upper arm.  Easily palpable left radial pulse Neuro: Intact motor and sensation of left upper extremity   Dialysis Duplex (07/25/2023) AVF                 PSV (cm/s)Flow Vol (mL/min)Comments  +--------------------+----------+-----------------+--------+  Native artery inflow   138           701                 +--------------------+----------+-----------------+--------+  AVF Anastomosis        473                               +--------------------+----------+-----------------+--------+     +------------+---------+------------+----------+---------------------------  ----+  OUTFLOW VEIN   PSV     Diameter  Depth (cm)           Describe                            (cm/s)      (cm)                                                +------------+---------+------------+----------+---------------------------  ----+  Shoulder      207       0.60       0.78     competing branch  measuring                                                            0.14cm                +------------+---------+------------+----------+---------------------------  ----+  Prox UA        188       0.52       0.54     competing branch   measuring                                                            0.20cm                +------------+---------+------------+----------+---------------------------  ----+  Mid UA         111       0.60       0.52                                     +------------+---------+------------+----------+---------------------------  ----+  Dist UA        132       0.59       0.41                                     +------------+---------+------------+----------+---------------------------  ----+  AC Fossa       845       0.44       0.57              stenotic            Assessment/Plan:  This is a 67 y.o. female who is s/p: Left brachiocephalic fistula creation on 06/18/2023   -She has been doing well since surgery.  She denies any issues with her incision such as erythema, drainage, or fevers.  On exam of her  left upper arm incision is well-healed -She denies any symptoms of steal syndrome such as left hand pain, excessive coldness, or weakness. She has an easily palpable left radial pulse -Duplex demonstrates a patent left brachiocephalic fistula that is close to maturing. Flow volume of 701 ml/min.  The fistula is not quite mature enough in size yet.  There is also a very tight area of stenosis in the [facility] fossa with a PSV of 845 cm/s.  On exam the fistula is very pulsatile proximal to the area of stenosis in the [facility] fossa.  She would benefit from balloon angioplasty to this area to reduce the stenosis. -We will schedule the patient for left upper extremity fistulogram with possible balloon angioplasty in the next 2 to 3 weeks on a Tuesday or Thursday.  She may have to hold her warfarin before the procedure.     Loel Dubonnet, PA-C [facility] [phone]

## 2023-08-15 NOTE — Progress Notes (Signed)
TR band removed at 1030. Left radial site level 0, clean, dry and intact.

## 2023-08-16 ENCOUNTER — Encounter (HOSPITAL_COMMUNITY): Payer: Self-pay | Admitting: Vascular Surgery

## 2023-08-24 DIAGNOSIS — Z992 Dependence on renal dialysis: Secondary | ICD-10-CM | POA: Diagnosis not present

## 2023-08-24 DIAGNOSIS — N186 End stage renal disease: Secondary | ICD-10-CM | POA: Diagnosis not present

## 2023-08-26 DIAGNOSIS — N2581 Secondary hyperparathyroidism of renal origin: Secondary | ICD-10-CM | POA: Diagnosis not present

## 2023-08-26 DIAGNOSIS — Z992 Dependence on renal dialysis: Secondary | ICD-10-CM | POA: Diagnosis not present

## 2023-08-26 DIAGNOSIS — N186 End stage renal disease: Secondary | ICD-10-CM | POA: Diagnosis not present

## 2023-08-26 DIAGNOSIS — D631 Anemia in chronic kidney disease: Secondary | ICD-10-CM | POA: Diagnosis not present

## 2023-08-26 DIAGNOSIS — D509 Iron deficiency anemia, unspecified: Secondary | ICD-10-CM | POA: Diagnosis not present

## 2023-09-02 DIAGNOSIS — N186 End stage renal disease: Secondary | ICD-10-CM | POA: Diagnosis not present

## 2023-09-02 DIAGNOSIS — Z992 Dependence on renal dialysis: Secondary | ICD-10-CM | POA: Diagnosis not present

## 2023-09-23 DIAGNOSIS — Z992 Dependence on renal dialysis: Secondary | ICD-10-CM | POA: Diagnosis not present

## 2023-09-23 DIAGNOSIS — N186 End stage renal disease: Secondary | ICD-10-CM | POA: Diagnosis not present

## 2023-09-25 DIAGNOSIS — Z23 Encounter for immunization: Secondary | ICD-10-CM | POA: Diagnosis not present

## 2023-09-25 DIAGNOSIS — N186 End stage renal disease: Secondary | ICD-10-CM | POA: Diagnosis not present

## 2023-09-25 DIAGNOSIS — Z992 Dependence on renal dialysis: Secondary | ICD-10-CM | POA: Diagnosis not present

## 2023-09-25 DIAGNOSIS — D631 Anemia in chronic kidney disease: Secondary | ICD-10-CM | POA: Diagnosis not present

## 2023-09-25 DIAGNOSIS — D509 Iron deficiency anemia, unspecified: Secondary | ICD-10-CM | POA: Diagnosis not present

## 2023-09-25 DIAGNOSIS — N2581 Secondary hyperparathyroidism of renal origin: Secondary | ICD-10-CM | POA: Diagnosis not present

## 2023-09-28 ENCOUNTER — Other Ambulatory Visit: Payer: Self-pay | Admitting: Family Medicine

## 2023-09-28 DIAGNOSIS — I10 Essential (primary) hypertension: Secondary | ICD-10-CM

## 2023-10-11 DIAGNOSIS — Z992 Dependence on renal dialysis: Secondary | ICD-10-CM | POA: Diagnosis not present

## 2023-10-11 DIAGNOSIS — N186 End stage renal disease: Secondary | ICD-10-CM | POA: Diagnosis not present

## 2023-10-15 ENCOUNTER — Other Ambulatory Visit: Payer: Self-pay | Admitting: Internal Medicine

## 2023-10-24 DIAGNOSIS — Z992 Dependence on renal dialysis: Secondary | ICD-10-CM | POA: Diagnosis not present

## 2023-10-24 DIAGNOSIS — N186 End stage renal disease: Secondary | ICD-10-CM | POA: Diagnosis not present

## 2023-10-25 DIAGNOSIS — D631 Anemia in chronic kidney disease: Secondary | ICD-10-CM | POA: Diagnosis not present

## 2023-10-25 DIAGNOSIS — N2581 Secondary hyperparathyroidism of renal origin: Secondary | ICD-10-CM | POA: Diagnosis not present

## 2023-10-25 DIAGNOSIS — D509 Iron deficiency anemia, unspecified: Secondary | ICD-10-CM | POA: Diagnosis not present

## 2023-10-25 DIAGNOSIS — N186 End stage renal disease: Secondary | ICD-10-CM | POA: Diagnosis not present

## 2023-10-25 DIAGNOSIS — Z992 Dependence on renal dialysis: Secondary | ICD-10-CM | POA: Diagnosis not present

## 2023-11-05 ENCOUNTER — Other Ambulatory Visit: Payer: Self-pay | Admitting: Internal Medicine

## 2023-11-05 ENCOUNTER — Ambulatory Visit (INDEPENDENT_AMBULATORY_CARE_PROVIDER_SITE_OTHER): Payer: No Typology Code available for payment source

## 2023-11-05 ENCOUNTER — Ambulatory Visit (INDEPENDENT_AMBULATORY_CARE_PROVIDER_SITE_OTHER): Payer: No Typology Code available for payment source | Admitting: Physician Assistant

## 2023-11-05 ENCOUNTER — Other Ambulatory Visit: Payer: Self-pay | Admitting: Physician Assistant

## 2023-11-05 ENCOUNTER — Encounter: Payer: Self-pay | Admitting: Physician Assistant

## 2023-11-05 VITALS — BP 138/80 | HR 66 | Ht 60.0 in | Wt 179.6 lb

## 2023-11-05 DIAGNOSIS — I770 Arteriovenous fistula, acquired: Secondary | ICD-10-CM

## 2023-11-05 DIAGNOSIS — N186 End stage renal disease: Secondary | ICD-10-CM | POA: Diagnosis not present

## 2023-11-05 DIAGNOSIS — Z992 Dependence on renal dialysis: Secondary | ICD-10-CM | POA: Diagnosis not present

## 2023-11-05 NOTE — Progress Notes (Signed)
Office Note     CC:  follow up Requesting Provider:  Juel Burrow, NP  HPI: Patricia Wu is a 67 y.o. (1956-09-05) female who presents for reevaluation of left arm AV fistula.  Brachiocephalic fistula was created by Dr. Arbie Cookey on 06/18/2023.  She experienced an infiltration event recently and has been dialyzing via right IJ TDC while hematoma in the left arm is resolving.  She states the "knot" in the left arm has drastically improved.  She has been dialyzing without issue from right IJ TDC.  She dialyzes in [place] on Monday Wednesday Friday.  She is accompanied today by her daughter.   Past Medical History:  Diagnosis Date   CKD (chronic kidney disease)    Hypercholesteremia    Hypertension    Noncompliance with medication regimen     Past Surgical History:  Procedure Laterality Date   A/V FISTULAGRAM Left 08/15/2023   Procedure: A/V Fistulagram;  Surgeon: Cephus Shelling, MD;  Location: [facility] INVASIVE CV LAB;  Service: Cardiovascular;  Laterality: Left;   AV FISTULA PLACEMENT Left 06/18/2023   Procedure: INSERTION OF LEFT ARM ARTERIOVENOUS (AV) FISTULA;  Surgeon: Larina Earthly, MD;  Location: [facility] ORS;  Service: Vascular;  Laterality: Left;   COLONOSCOPY WITH PROPOFOL N/A 10/17/2020   Procedure: COLONOSCOPY WITH PROPOFOL;  Surgeon: Lanelle Bal, DO;  Location: [facility] ENDO SU[facility];  Service: Endoscopy;  Laterality: N/A;  1:00pm   IR FLUORO GUIDE CV LINE RIGHT  05/09/2023   IR US GUIDE VASC ACCESS RIGHT  05/10/2023   POLYPECTOMY  10/17/2020   Procedure: POLYPECTOMY;  Surgeon: Lanelle Bal, DO;  Location: [facility];  Service: Endoscopy;;    Social History   Socioeconomic History   Marital status: Married    Spouse name: Not on file   Number of children: 2   Years of education: Not on file   Highest education level: Not on file  Occupational History   Not on file  Tobacco Use   Smoking status: Never    Passive exposure: Never   Smokeless tobacco: Never   Vaping Use   Vaping status: Never Used  Substance and Sexual Activity   Alcohol use: No   Drug use: No   Sexual activity: Yes  Other Topics Concern   Not on file  Social History Narrative   Not on file   Social Determinants of Health   Financial Resource Strain: Low Risk  (11/28/2022)   Overall Financial Resource Strain (CARDIA)    Difficulty of Paying Living Expenses: Not hard at all  Food Insecurity: No Food Insecurity (05/07/2023)   Hunger Vital Sign    Worried About Running Out of Food in the Last Year: Never true    Ran Out of Food in the Last Year: Never true  Transportation Needs: No Transportation Needs (05/07/2023)   PRAPARE - Administrator, Civil Service (Medical): No    Lack of Transportation (Non-Medical): No  Physical Activity: Insufficiently Active (11/28/2022)   Exercise Vital Sign    Days of Exercise per Week: 3 days    Minutes of Exercise per Session: 30 min  Stress: No Stress Concern Present (11/28/2022)   [employer] of Occupational Health - Occupational Stress Questionnaire    Feeling of Stress : Not at all  Social Connections: Socially Integrated (11/28/2022)   Social Connection and Isolation Panel [NHANES]    Frequency of Communication with Friends and Family: Three times a week    Frequency of  Social Gatherings with Friends and Family: Once a week    Attends Religious Services: More than 4 times per year    Active Member of [employer] or Organizations: Yes    Attends Engineer, structural: More than 4 times per year    Marital Status: Married  Catering manager Violence: Not At Risk (05/07/2023)   Humiliation, Afraid, Rape, and Kick questionnaire    Fear of Current or Ex-Partner: No    Emotionally Abused: No    Physically Abused: No    Sexually Abused: No    Family History  Problem Relation Age of Onset   Liver cancer Neg Hx    Colon cancer Neg Hx     Current Outpatient Medications  Medication Sig Dispense Refill    acetaminophen (TYLENOL) 325 MG tablet Take 325-650 mg by mouth every 6 (six) hours as needed (pain.).      albuterol (VENTOLIN HFA) 108 (90 Base) MCG/ACT inhaler Inhale 1-2 puffs into the lungs every 6 (six) hours as needed for wheezing or shortness of breath. 8 g 0   atorvastatin (LIPITOR) 10 MG tablet TAKE 1 TABLET(10 MG) BY MOUTH DAILY 90 tablet 2   B Complex-C-Folic Acid (RENA-VITE RX) 1 MG TABS Take 1 tablet by mouth daily.     calcitRIOL (ROCALTROL) 0.25 MCG capsule Take 0.5 mcg by mouth daily.     calcium acetate (PHOSLO) 667 MG capsule Take 1,334 mg by mouth 3 (three) times daily.     furosemide (LASIX) 40 MG tablet Take 1 tablet (40 mg total) by mouth as needed for edema (swelling). 30 tablet 2   irbesartan (AVAPRO) 150 MG tablet SMARTSIG:1 Tablet(s) By Mouth Every Evening     metoprolol tartrate (LOPRESSOR) 100 MG tablet TAKE 1 TABLET BY MOUTH TWICE DAILY, TAKE WITH 25 MG TABLET TWICE DAILY TO EQUAL 125 MG TWICE DAILY 180 tablet 1   metoprolol tartrate (LOPRESSOR) 25 MG tablet TAKE 1 TABLET BY MOUTH TWICE DAILY. TAKE WITH 100MG  TWICE DAILY TO EQUAL 125MG  TWICE DAILY 180 tablet 1   oxyCODONE-acetaminophen (PERCOCET) 5-325 MG tablet Take 1 tablet by mouth every 6 (six) hours as needed for severe pain. 8 tablet 0   warfarin (COUMADIN) 2.5 MG tablet TAKE 1 TABLET BY MOUTH DAILY AS DIRECTED BY COUMADIN CLINIC. STOP ELIQUIS 15 tablet 0   Current Facility-Administered Medications  Medication Dose Route Frequency Provider Last Rate Last Admin   0.9 %  sodium chloride infusion  250 mL Intravenous PRN Cephus Shelling, MD       sodium chloride flush (NS) 0.9 % injection 3 mL  3 mL Intravenous Q12H Cephus Shelling, MD       sodium chloride flush (NS) 0.9 % injection 3 mL  3 mL Intravenous PRN Cephus Shelling, MD        Allergies  Allergen Reactions   Ibuprofen Other (See Comments)    pcp said raises her BP     REVIEW OF SYSTEMS:   [X]  denotes positive finding, [ ]  denotes  negative finding Cardiac  Comments:  Chest pain or chest pressure:    Shortness of breath upon exertion:    Short of breath when lying flat:    Irregular heart rhythm:        Vascular    Pain in calf, thigh, or hip brought on by ambulation:    Pain in feet at night that wakes you up from your sleep:     Blood clot in your veins:  Leg swelling:         Pulmonary    Oxygen at home:    Productive cough:     Wheezing:         Neurologic    Sudden weakness in arms or legs:     Sudden numbness in arms or legs:     Sudden onset of difficulty speaking or slurred speech:    Temporary loss of vision in one eye:     Problems with dizziness:         Gastrointestinal    Blood in stool:     Vomited blood:         Genitourinary    Burning when urinating:     Blood in urine:        Psychiatric    Major depression:         Hematologic    Bleeding problems:    Problems with blood clotting too easily:        Skin    Rashes or ulcers:        Constitutional    Fever or chills:      PHYSICAL EXAMINATION:  Vitals:   11/05/23 1123  BP: 138/80  Pulse: 66  Weight: 179 lb 9.6 oz (81.5 kg)  Height: 5' (1.524 m)    General:  WDWN in NAD; vital signs documented above Gait: Not observed HENT: WNL, normocephalic Pulmonary: normal non-labored breathing , without Rales, rhonchi,  wheezing Cardiac: regular HR Abdomen: soft, NT, no masses Skin: without rashes Vascular Exam/Pulses: Palpable left radial pulse; palpable thrill throughout the upper arm Extremities: Still some bruising near the [facility] fossa but no palpable firm hematoma Musculoskeletal: no muscle wasting or atrophy  Neurologic: A&O X 3 Psychiatric:  The pt has Normal affect.   Non-Invasive Vascular Imaging:   Left arm fistula duplex demonstrates a widely patent fistula without areas of hemodynamically significant stenosis; excellent flow volume; fistula greater than 6 mm in diameter and is less than 6 mm from the surface  of the skin in the distal and middle upper arm.  She does have a sidebranch in the distal upper arm however this does not significantly impact the flow volume.    ASSESSMENT/PLAN:: 67 y.o. female here for follow up for reevaluation of left brachiocephalic fistula  Ms. Modest Dubicki is a 67 year old female with left brachiocephalic fistula.  She experienced an infiltration event recently and has been dialyzing via right IJ TDC.  Hematoma seems to have resolved.  Fistula duplex demonstrates a widely patent fistula without any areas of hemodynamically significant stenosis.  There is a large sidebranch however this does not impact the flow volume in a significant way.  No indication for sidebranch ligation.  Based on physical exam as well as duplex findings we will continue to cannulate left arm fistula starting tomorrow 11/13.  TDC can be removed when nephrology is comfortable with the performance of the fistula.  She can follow-up on an as-needed basis.   Emilie Rutter, PA-C [facility] [phone]  Clinic MD:   Randie Heinz on call

## 2023-11-05 NOTE — Telephone Encounter (Signed)
Refill request for warfarin:  Pt is past due for INR appt.  Message sent to schedulers to make appt.  Refill approved for 15 tablets only.

## 2023-11-06 DIAGNOSIS — N186 End stage renal disease: Secondary | ICD-10-CM | POA: Diagnosis not present

## 2023-11-06 DIAGNOSIS — Z992 Dependence on renal dialysis: Secondary | ICD-10-CM | POA: Diagnosis not present

## 2023-11-23 DIAGNOSIS — N186 End stage renal disease: Secondary | ICD-10-CM | POA: Diagnosis not present

## 2023-11-23 DIAGNOSIS — Z992 Dependence on renal dialysis: Secondary | ICD-10-CM | POA: Diagnosis not present

## 2023-11-25 DIAGNOSIS — N2581 Secondary hyperparathyroidism of renal origin: Secondary | ICD-10-CM | POA: Diagnosis not present

## 2023-11-25 DIAGNOSIS — N186 End stage renal disease: Secondary | ICD-10-CM | POA: Diagnosis not present

## 2023-11-25 DIAGNOSIS — Z992 Dependence on renal dialysis: Secondary | ICD-10-CM | POA: Diagnosis not present

## 2023-11-25 DIAGNOSIS — D631 Anemia in chronic kidney disease: Secondary | ICD-10-CM | POA: Diagnosis not present

## 2023-11-25 DIAGNOSIS — D509 Iron deficiency anemia, unspecified: Secondary | ICD-10-CM | POA: Diagnosis not present

## 2023-11-26 ENCOUNTER — Other Ambulatory Visit: Payer: Self-pay | Admitting: Internal Medicine

## 2023-12-02 DIAGNOSIS — Z992 Dependence on renal dialysis: Secondary | ICD-10-CM | POA: Diagnosis not present

## 2023-12-02 DIAGNOSIS — N186 End stage renal disease: Secondary | ICD-10-CM | POA: Diagnosis not present

## 2023-12-03 ENCOUNTER — Ambulatory Visit (INDEPENDENT_AMBULATORY_CARE_PROVIDER_SITE_OTHER): Payer: No Typology Code available for payment source | Admitting: *Deleted

## 2023-12-03 DIAGNOSIS — Z0000 Encounter for general adult medical examination without abnormal findings: Secondary | ICD-10-CM

## 2023-12-03 NOTE — Progress Notes (Signed)
Subjective:   Patricia Wu is a 67 y.o. female who presents for Medicare Annual (Subsequent) preventive examination.  Visit Complete: Virtual I connected with  Patricia Wu on 12/03/23 by a audio enabled telemedicine application and verified that I am speaking with the correct person using two identifiers.  Patient Location: Home  Provider Location: Home Office  I discussed the limitations of evaluation and management by telemedicine. The patient expressed understanding and agreed to proceed.  Vital Signs: Because this visit was a virtual/telehealth visit, some criteria may be missing or patient reported. Any vitals not documented were not able to be obtained and vitals that have been documented are patient reported.   Cardiac Risk Factors include: advanced age (>52men, >35 women);hypertension     Objective:    There were no vitals filed for this visit. There is no height or weight on file to calculate BMI.     12/03/2023   10:15 AM 08/15/2023    6:12 AM 06/18/2023    7:43 AM 05/07/2023   11:45 PM 05/07/2023    4:54 PM 03/04/2023    2:36 PM 11/28/2022   12:09 PM  Advanced Directives  Does Patient Have a Medical Advance Directive? No No No  No No No  Would patient like information on creating a medical advance directive? No - Patient declined  No - Patient declined No - Patient declined  No - Patient declined No - Patient declined    Current Medications (verified) Outpatient Encounter Medications as of 12/03/2023  Medication Sig   acetaminophen (TYLENOL) 325 MG tablet Take 325-650 mg by mouth every 6 (six) hours as needed (pain.).    albuterol (VENTOLIN HFA) 108 (90 Base) MCG/ACT inhaler Inhale 1-2 puffs into the lungs every 6 (six) hours as needed for wheezing or shortness of breath.   atorvastatin (LIPITOR) 10 MG tablet TAKE 1 TABLET(10 MG) BY MOUTH DAILY   B Complex-C-Folic Acid (RENA-VITE RX) 1 MG TABS Take 1 tablet by mouth daily.   calcitRIOL (ROCALTROL) 0.25 MCG  capsule Take 0.5 mcg by mouth daily.   calcium acetate (PHOSLO) 667 MG capsule Take 1,334 mg by mouth 3 (three) times daily.   furosemide (LASIX) 40 MG tablet Take 1 tablet (40 mg total) by mouth as needed for edema (swelling).   irbesartan (AVAPRO) 150 MG tablet SMARTSIG:1 Tablet(s) By Mouth Every Evening   metoprolol tartrate (LOPRESSOR) 100 MG tablet TAKE 1 TABLET BY MOUTH TWICE DAILY, TAKE WITH 25 MG TABLET TWICE DAILY TO EQUAL 125 MG TWICE DAILY   metoprolol tartrate (LOPRESSOR) 25 MG tablet TAKE 1 TABLET BY MOUTH TWICE DAILY. TAKE WITH 100MG  TWICE DAILY TO EQUAL 125MG  TWICE DAILY   oxyCODONE-acetaminophen (PERCOCET) 5-325 MG tablet Take 1 tablet by mouth every 6 (six) hours as needed for severe pain.   warfarin (COUMADIN) 2.5 MG tablet TAKE 1 TABLET BY MOUTH DAILY AS DIRECTED BY COUMADIN CLINIC. STOP ELIQUIS   Facility-Administered Encounter Medications as of 12/03/2023  Medication   0.9 %  sodium chloride infusion   sodium chloride flush (NS) 0.9 % injection 3 mL   sodium chloride flush (NS) 0.9 % injection 3 mL    Allergies (verified) Ibuprofen   History: Past Medical History:  Diagnosis Date   CKD (chronic kidney disease)    Hypercholesteremia    Hypertension    Noncompliance with medication regimen    Past Surgical History:  Procedure Laterality Date   A/V FISTULAGRAM Left 08/15/2023   Procedure: A/V Fistulagram;  Surgeon: Chestine Spore,  Canary Brim, MD;  Location: [facility] INVASIVE CV LAB;  Service: Cardiovascular;  Laterality: Left;   AV FISTULA PLACEMENT Left 06/18/2023   Procedure: INSERTION OF LEFT ARM ARTERIOVENOUS (AV) FISTULA;  Surgeon: Larina Earthly, MD;  Location: [facility] ORS;  Service: Vascular;  Laterality: Left;   COLONOSCOPY WITH PROPOFOL N/A 10/17/2020   Procedure: COLONOSCOPY WITH PROPOFOL;  Surgeon: Lanelle Bal, DO;  Location: [facility];  Service: Endoscopy;  Laterality: N/A;  1:00pm   IR FLUORO GUIDE CV LINE RIGHT  05/09/2023   IR US GUIDE VASC ACCESS RIGHT   05/10/2023   POLYPECTOMY  10/17/2020   Procedure: POLYPECTOMY;  Surgeon: Lanelle Bal, DO;  Location: [facility];  Service: Endoscopy;;   Family History  Problem Relation Age of Onset   Liver cancer Neg Hx    Colon cancer Neg Hx    Social History   Socioeconomic History   Marital status: Married    Spouse name: Not on file   Number of children: 2   Years of education: Not on file   Highest education level: Not on file  Occupational History   Not on file  Tobacco Use   Smoking status: Never    Passive exposure: Never   Smokeless tobacco: Never  Vaping Use   Vaping status: Never Used  Substance and Sexual Activity   Alcohol use: No   Drug use: No   Sexual activity: Yes  Other Topics Concern   Not on file  Social History Narrative   Not on file   Social Determinants of Health   Financial Resource Strain: Low Risk  (12/03/2023)   Overall Financial Resource Strain (CARDIA)    Difficulty of Paying Living Expenses: Not hard at all  Food Insecurity: No Food Insecurity (12/03/2023)   Hunger Vital Sign    Worried About Running Out of Food in the Last Year: Never true    Ran Out of Food in the Last Year: Never true  Transportation Needs: No Transportation Needs (12/03/2023)   PRAPARE - Administrator, Civil Service (Medical): No    Lack of Transportation (Non-Medical): No  Physical Activity: Insufficiently Active (12/03/2023)   Exercise Vital Sign    Days of Exercise per Week: 2 days    Minutes of Exercise per Session: 30 min  Stress: No Stress Concern Present (12/03/2023)   [employer] of Occupational Health - Occupational Stress Questionnaire    Feeling of Stress : Not at all  Social Connections: Moderately Integrated (12/03/2023)   Social Connection and Isolation Panel [NHANES]    Frequency of Communication with Friends and Family: Three times a week    Frequency of Social Gatherings with Friends and Family: Three times a week    Attends  Religious Services: More than 4 times per year    Active Member of Clubs or Organizations: No    Attends Banker Meetings: Never    Marital Status: Married    Tobacco Counseling Counseling given: Not Answered   Clinical Intake:  Pre-visit preparation completed: Yes  Pain : No/denies pain     Diabetes: No  How often do you need to have someone help you when you read instructions, pamphlets, or other written materials from your doctor or pharmacy?: 1 - Never  Interpreter Needed?: No  Information entered by :: Remi Haggard LPN   Activities of Daily Living    12/03/2023   10:16 AM 05/07/2023   11:00 PM  In your present state  of health, do you have any difficulty performing the following activities:  Hearing? 0 0  Vision? 0 0  Difficulty concentrating or making decisions? 0 0  Walking or climbing stairs? 1 0  Dressing or bathing? 0 0  Doing errands, shopping? 0 0  Preparing Food and eating ? N   Using the Toilet? N   Do you have problems with loss of bowel control? N   Managing your Medications? N   Managing your Finances? N   Housekeeping or managing your Housekeeping? N     Patient Care Team: Shade Flood, MD as PCP - General (Family Medicine) Marjo Bicker, MD as PCP - Cardiology (Cardiology) Lanelle Bal, DO as Consulting Physician (Internal Medicine) Sharlene Dory, NP as Nurse Practitioner (Cardiology)  Indicate any recent Medical Services you may have received from other than [facility] providers in the past year (date may be approximate).     Assessment:   This is a routine wellness examination for Patricia Wu.  Hearing/Vision screen Hearing Screening - Comments:: No trouble hearing Vision Screening - Comments:: Not up to date   Goals Addressed             This Visit's Progress    Patient Stated   Not on track    Hoping hip will be feeling better      Patient Stated       Feeling better        Depression Screen     12/03/2023   10:19 AM 07/18/2023    1:40 PM 04/03/2023   11:33 AM 01/16/2023   10:57 AM 11/28/2022   11:48 AM 07/11/2022   10:35 AM 04/04/2022   11:01 AM  PHQ 2/9 Scores  PHQ - 2 Score 1 2 2  0 0 0 0  PHQ- 9 Score 3 4 8  0 0  1    Fall Risk    12/03/2023   10:14 AM 07/18/2023    1:40 PM 04/03/2023   11:33 AM 01/16/2023   10:57 AM 11/28/2022   11:43 AM  Fall Risk   Falls in the past year? 0 1 0 0 0  Number falls in past yr: 0 0 0 0 0  Injury with Fall? 0 0 0 0 0  Risk for fall due to :  No Fall Risks No Fall Risks No Fall Risks   Follow up Falls evaluation completed;Education provided;Falls prevention discussed Falls evaluation completed  Falls evaluation completed Falls evaluation completed;Education provided;Falls prevention discussed    MEDICARE RISK AT HOME: Medicare Risk at Home Any stairs in or around the home?: No If so, are there any without handrails?: No Home free of loose throw rugs in walkways, pet beds, electrical cords, etc?: Yes Adequate lighting in your home to reduce risk of falls?: Yes Life alert?: No Use of a cane, walker or w/c?: No Grab bars in the bathroom?: No Shower chair or bench in shower?: No Elevated toilet seat or a handicapped toilet?: No  TIMED UP AND GO:  Was the test performed?  No    Cognitive Function:        12/03/2023   10:18 AM 11/28/2022   12:09 PM  6CIT Screen  What Year? 0 points 0 points  What month? 0 points 0 points  What time? 0 points 0 points  Count back from 20 2 points 0 points  Months in reverse 0 points 0 points  Repeat phrase 4 points 0 points  Total Score 6 points 0 points  Immunizations Immunization History  Administered Date(s) Administered   Influenza,inj,Quad PF,6+ Mos 02/01/2018   PNEUMOCOCCAL CONJUGATE-20 02/14/2022   Tdap 02/01/2018   Zoster Recombinant(Shingrix) 10/26/2021   Zoster, Live 01/07/2017    TDAP status: Up to date  Flu Vaccine status: Up to date  Pneumococcal vaccine status: Up to  date  Covid-19 vaccine status: Information provided on how to obtain vaccines.   Qualifies for Shingles Vaccine? Yes   Zostavax completed No   Shingrix Completed?: No.    Education has been provided regarding the importance of this vaccine. Patient has been advised to call insurance company to determine out of pocket expense if they have not yet received this vaccine. Advised may also receive vaccine at local pharmacy or Health Dept. Verbalized acceptance and understanding.  Screening Tests Health Maintenance  Topic Date Due   COVID-19 Vaccine (2 - 2023-24 season) 12/19/2023 (Originally 08/25/2023)   Zoster Vaccines- Shingrix (2 of 2) 03/02/2024 (Originally 12/21/2021)   MAMMOGRAM  12/28/2023   Medicare Annual Wellness (AWV)  12/02/2024   DTaP/Tdap/Td (2 - Td or Tdap) 02/02/2028   Colonoscopy  10/17/2030   Pneumonia Vaccine 38+ Years old  Completed   DEXA SCAN  Completed   Hepatitis C Screening  Completed   HPV VACCINES  Aged Out   INFLUENZA VACCINE  Discontinued    Health Maintenance  There are no preventive care reminders to display for this patient.   Colorectal cancer screening: Type of screening: Colonoscopy. Completed 2021. Repeat every 10 years  Mammogram status: Completed  . Repeat every year  Bone Density every 2 years  Lung Cancer Screening: (Low Dose CT Chest recommended if Age 73-80 years, 20 pack-year currently smoking OR have quit w/in 15years.) does not qualify.   Lung Cancer Screening Referral:   Additional Screening:  Hepatitis C Screening: does not qualify; Completed   Vision Screening: Recommended annual ophthalmology exams for early detection of glaucoma and other disorders of the eye. Is the patient up to date with their annual eye exam?  No  Who is the provider or what is the name of the office in which the patient attends annual eye exams? Education provided If pt is not established with a provider, would they like to be referred to a provider to  establish care? No .   Dental Screening: Recommended annual dental exams for proper oral hygiene  Community Resource Referral / Chronic Care Management: CRR required this visit?  No   CCM required this visit?  No     Plan:     I have personally reviewed and noted the following in the patient's chart:   Medical and social history Use of alcohol, tobacco or illicit drugs  Current medications and supplements including opioid prescriptions. Patient is currently taking opioid prescriptions. Information provided to patient regarding non-opioid alternatives. Patient advised to discuss non-opioid treatment plan with their provider. Functional ability and status Nutritional status Physical activity Advanced directives List of other physicians Hospitalizations, surgeries, and ER visits in previous 12 months Vitals Screenings to include cognitive, depression, and falls Referrals and appointments  In addition, I have reviewed and discussed with patient certain preventive protocols, quality metrics, and best practice recommendations. A written personalized care plan for preventive services as well as general preventive health recommendations were provided to patient.     Remi Haggard, LPN   29/56/2130   After Visit Summary: (MyChart) Due to this being a telephonic visit, the after visit summary with patients personalized plan was offered to patient  via MyChart   Nurse Notes:

## 2023-12-03 NOTE — Patient Instructions (Signed)
Patricia Wu , Thank you for taking time to come for your Medicare Wellness Visit. I appreciate your ongoing commitment to your health goals. Please review the following plan we discussed and let me know if I can assist you in the future.   Screening recommendations/referrals: Colonoscopy: up to date Mammogram: up to date Bone Density: up to date Recommended yearly ophthalmology/optometry visit for glaucoma screening and checkup Recommended yearly dental visit for hygiene and checkup  Vaccinations: Influenza vaccine: up to date Pneumococcal vaccine: up to date Tdap vaccine: up to date Shingles vaccine: Education provided    Advanced directives: Education provided    Preventive Care 65 Years and Older, Female Preventive care refers to lifestyle choices and visits with your health care provider that can promote health and wellness. What does preventive care include? A yearly physical exam. This is also called an annual well check. Dental exams once or twice a year. Routine eye exams. Ask your health care provider how often you should have your eyes checked. Personal lifestyle choices, including: Daily care of your teeth and gums. Regular physical activity. Eating a healthy diet. Avoiding tobacco and drug use. Limiting alcohol use. Practicing safe sex. Taking low-dose aspirin every day. Taking vitamin and mineral supplements as recommended by your health care provider. What happens during an annual well check? The services and screenings done by your health care provider during your annual well check will depend on your age, overall health, lifestyle risk factors, and family history of disease. Counseling  Your health care provider may ask you questions about your: Alcohol use. Tobacco use. Drug use. Emotional well-being. Home and relationship well-being. Sexual activity. Eating habits. History of falls. Memory and ability to understand (cognition). Work and work  Astronomer. Reproductive health. Screening  You may have the following tests or measurements: Height, weight, and BMI. Blood pressure. Lipid and cholesterol levels. These may be checked every 5 years, or more frequently if you are over 75 years old. Skin check. Lung cancer screening. You may have this screening every year starting at age 63 if you have a 30-pack-year history of smoking and currently smoke or have quit within the past 15 years. Fecal occult blood test (FOBT) of the stool. You may have this test every year starting at age 25. Flexible sigmoidoscopy or colonoscopy. You may have a sigmoidoscopy every 5 years or a colonoscopy every 10 years starting at age 4. Hepatitis C blood test. Hepatitis B blood test. Sexually transmitted disease (STD) testing. Diabetes screening. This is done by checking your blood sugar (glucose) after you have not eaten for a while (fasting). You may have this done every 1-3 years. Bone density scan. This is done to screen for osteoporosis. You may have this done starting at age 43. Mammogram. This may be done every 1-2 years. Talk to your health care provider about how often you should have regular mammograms. Talk with your health care provider about your test results, treatment options, and if necessary, the need for more tests. Vaccines  Your health care provider may recommend certain vaccines, such as: Influenza vaccine. This is recommended every year. Tetanus, diphtheria, and acellular pertussis (Tdap, Td) vaccine. You may need a Td booster every 10 years. Zoster vaccine. You may need this after age 67. Pneumococcal 13-valent conjugate (PCV13) vaccine. One dose is recommended after age 33. Pneumococcal polysaccharide (PPSV23) vaccine. One dose is recommended after age 60. Talk to your health care provider about which screenings and vaccines you need and how often  you need them. This information is not intended to replace advice given to you by  your health care provider. Make sure you discuss any questions you have with your health care provider. Document Released: 01/06/2016 Document Revised: 08/29/2016 Document Reviewed: 10/11/2015 Elsevier Interactive Patient Education  2017 [employer].  Fall Prevention in the Home Falls can cause injuries. They can happen to people of all ages. There are many things you can do to make your home safe and to help prevent falls. What can I do on the outside of my home? Regularly fix the edges of walkways and driveways and fix any cracks. Remove anything that might make you trip as you walk through a door, such as a raised step or threshold. Trim any bushes or trees on the path to your home. Use bright outdoor lighting. Clear any walking paths of anything that might make someone trip, such as rocks or tools. Regularly check to see if handrails are loose or broken. Make sure that both sides of any steps have handrails. Any raised decks and porches should have guardrails on the edges. Have any leaves, snow, or ice cleared regularly. Use sand or salt on walking paths during winter. Clean up any spills in your garage right away. This includes oil or grease spills. What can I do in the bathroom? Use night lights. Install grab bars by the toilet and in the tub and shower. Do not use towel bars as grab bars. Use non-skid mats or decals in the tub or shower. If you need to sit down in the shower, use a plastic, non-slip stool. Keep the floor dry. Clean up any water that spills on the floor as soon as it happens. Remove soap buildup in the tub or shower regularly. Attach bath mats securely with double-sided non-slip rug tape. Do not have throw rugs and other things on the floor that can make you trip. What can I do in the bedroom? Use night lights. Make sure that you have a light by your bed that is easy to reach. Do not use any sheets or blankets that are too big for your bed. They should not hang  down onto the floor. Have a firm chair that has side arms. You can use this for support while you get dressed. Do not have throw rugs and other things on the floor that can make you trip. What can I do in the kitchen? Clean up any spills right away. Avoid walking on wet floors. Keep items that you use a lot in easy-to-reach places. If you need to reach something above you, use a strong step stool that has a grab bar. Keep electrical cords out of the way. Do not use floor polish or wax that makes floors slippery. If you must use wax, use non-skid floor wax. Do not have throw rugs and other things on the floor that can make you trip. What can I do with my stairs? Do not leave any items on the stairs. Make sure that there are handrails on both sides of the stairs and use them. Fix handrails that are broken or loose. Make sure that handrails are as long as the stairways. Check any carpeting to make sure that it is firmly attached to the stairs. Fix any carpet that is loose or worn. Avoid having throw rugs at the top or bottom of the stairs. If you do have throw rugs, attach them to the floor with carpet tape. Make sure that you have a light  switch at the top of the stairs and the bottom of the stairs. If you do not have them, ask someone to add them for you. What else can I do to help prevent falls? Wear shoes that: Do not have high heels. Have rubber bottoms. Are comfortable and fit you well. Are closed at the toe. Do not wear sandals. If you use a stepladder: Make sure that it is fully opened. Do not climb a closed stepladder. Make sure that both sides of the stepladder are locked into place. Ask someone to hold it for you, if possible. Clearly mark and make sure that you can see: Any grab bars or handrails. First and last steps. Where the edge of each step is. Use tools that help you move around (mobility aids) if they are needed. These  include: Canes. Walkers. Scooters. Crutches. Turn on the lights when you go into a dark area. Replace any light bulbs as soon as they burn out. Set up your furniture so you have a clear path. Avoid moving your furniture around. If any of your floors are uneven, fix them. If there are any pets around you, be aware of where they are. Review your medicines with your doctor. Some medicines can make you feel dizzy. This can increase your chance of falling. Ask your doctor what other things that you can do to help prevent falls. This information is not intended to replace advice given to you by your health care provider. Make sure you discuss any questions you have with your health care provider. Document Released: 10/06/2009 Document Revised: 05/17/2016 Document Reviewed: 01/14/2015 Elsevier Interactive Patient Education  2017 [employer].

## 2023-12-12 ENCOUNTER — Other Ambulatory Visit: Payer: Self-pay | Admitting: Internal Medicine

## 2023-12-12 NOTE — Telephone Encounter (Signed)
Called pt.  She has appt to see Sharlene Dory NP and get INR check on 01/02/24.  Warfarin refill approved x 1.

## 2023-12-21 ENCOUNTER — Other Ambulatory Visit: Payer: Self-pay | Admitting: Nurse Practitioner

## 2023-12-24 DIAGNOSIS — Z992 Dependence on renal dialysis: Secondary | ICD-10-CM | POA: Diagnosis not present

## 2023-12-24 DIAGNOSIS — N186 End stage renal disease: Secondary | ICD-10-CM | POA: Diagnosis not present

## 2023-12-25 DIAGNOSIS — N186 End stage renal disease: Secondary | ICD-10-CM | POA: Diagnosis not present

## 2023-12-25 DIAGNOSIS — D631 Anemia in chronic kidney disease: Secondary | ICD-10-CM | POA: Diagnosis not present

## 2023-12-25 DIAGNOSIS — D509 Iron deficiency anemia, unspecified: Secondary | ICD-10-CM | POA: Diagnosis not present

## 2023-12-25 DIAGNOSIS — Z992 Dependence on renal dialysis: Secondary | ICD-10-CM | POA: Diagnosis not present

## 2024-01-02 ENCOUNTER — Ambulatory Visit (INDEPENDENT_AMBULATORY_CARE_PROVIDER_SITE_OTHER): Payer: No Typology Code available for payment source | Admitting: *Deleted

## 2024-01-02 ENCOUNTER — Encounter: Payer: Self-pay | Admitting: Nurse Practitioner

## 2024-01-02 ENCOUNTER — Ambulatory Visit: Payer: No Typology Code available for payment source | Attending: Nurse Practitioner | Admitting: Nurse Practitioner

## 2024-01-02 VITALS — BP 120/62 | HR 94 | Ht 62.0 in | Wt 178.0 lb

## 2024-01-02 DIAGNOSIS — I4891 Unspecified atrial fibrillation: Secondary | ICD-10-CM

## 2024-01-02 DIAGNOSIS — I5022 Chronic systolic (congestive) heart failure: Secondary | ICD-10-CM | POA: Diagnosis not present

## 2024-01-02 DIAGNOSIS — N186 End stage renal disease: Secondary | ICD-10-CM

## 2024-01-02 DIAGNOSIS — Z5181 Encounter for therapeutic drug level monitoring: Secondary | ICD-10-CM

## 2024-01-02 DIAGNOSIS — I428 Other cardiomyopathies: Secondary | ICD-10-CM

## 2024-01-02 DIAGNOSIS — I890 Lymphedema, not elsewhere classified: Secondary | ICD-10-CM | POA: Diagnosis not present

## 2024-01-02 DIAGNOSIS — I10 Essential (primary) hypertension: Secondary | ICD-10-CM

## 2024-01-02 DIAGNOSIS — I480 Paroxysmal atrial fibrillation: Secondary | ICD-10-CM

## 2024-01-02 LAB — POCT INR: INR: 1.2 — AB (ref 2.0–3.0)

## 2024-01-02 MED ORDER — WARFARIN SODIUM 2.5 MG PO TABS: ORAL_TABLET | ORAL | 0 refills | Status: DC

## 2024-01-02 MED ORDER — IRBESARTAN 150 MG PO TABS: 150.0000 mg | ORAL_TABLET | Freq: Every day | ORAL | 6 refills | Status: DC

## 2024-01-02 MED ORDER — METOPROLOL TARTRATE 100 MG PO TABS: 100.0000 mg | ORAL_TABLET | Freq: Two times a day (BID) | ORAL | 1 refills | Status: DC

## 2024-01-02 MED ORDER — METOPROLOL TARTRATE 25 MG PO TABS: 25.0000 mg | ORAL_TABLET | Freq: Two times a day (BID) | ORAL | 1 refills | Status: DC

## 2024-01-02 NOTE — Patient Instructions (Addendum)
 Medication Instructions:  Your physician recommends that you continue on your current medications as directed. Please refer to the Current Medication list given to you today.  Labwork: None   Testing/Procedures: None   Follow-Up: Your physician recommends that you schedule a follow-up appointment in: 4-6 month  Any Other Special Instructions Will Be Listed Below (If Applicable).  If you need a refill on your cardiac medications before your next appointment, please call your pharmacy.

## 2024-01-02 NOTE — Progress Notes (Signed)
 Office Visit    Patient Name: Patricia Wu Date of Encounter: 01/02/2024 PCP:  Levora Reyes SAUNDERS, MD [facility]  Cardiologist:  Diannah SHAUNNA Maywood, MD  Advanced Practice Provider:  Miriam Norris, NP Electrophysiologist:  None   Chief Complaint and HPI    Patricia Wu is a 68 y.o. female with a hx of HFmrEF, HTN, HLD, asthma, obesity, ESRD on HD, and A-fib, who presents today for follow-up.   History of Present Illness    Patricia Wu is a 68 y.o. female with a PMH as mentioned above.  Previous patient of Dr. Charls.   Admitted 02/2023 with A-fib with RVR.  Was found to be be in A-fib with RVR at nephrologist office.  Cardiology was consulted for treatment.  Became stable after 2 doses of IV diltiazem .  Eliquis  was initiated.  Heart rate stabilized on metoprolol  50 mg twice daily.  Hospital course complicated by AKI on CKD stage IV.  Baseline creatinine around 3.5.  Per patient's report, she is being evaluated for AV graft.  Nephrology was consulted, was told to follow-up outpatient.  Received cardiac monitor alert via live telemetry with conduction with run of V. tach around 15 beats with atrial fibrillation/a flutter.  RN called patient and stated she did not feel any symptoms at that time, was asymptomatic -see telephone note dated April 17, 2023.  Recent hospital admission 04/2023 for weight gain, diagnosed with ESRD, had temporary dialysis, started on hemodialysis.  Noted to have some A-fib with RVR.   I last saw her for outpatient follow-up on August 05, 2023. Was doing well, but noted feeling fatigued. LE swelling improved. Was pending A/V fistulagram of left arm later that month with [facility].   Today she presents for follow-up. Continues to do well. Denies any acute cardiac complaints or concerns. Denies any chest pain, shortness of breath, palpitations, syncope, presyncope, dizziness, orthopnea, PND, swelling or significant weight changes, acute  bleeding, or claudication. Pt states she still has her hemodialysis port. Tolerating HD well.   EKGs/Labs/Other Studies Reviewed:   The following studies were reviewed today:   EKG:  EKG is not ordered today.   Cardiac preventice 06/2023:   62% A-fib burden ranging from 80-158 bpm with an average HR 105 bpm.  Normal sinus rhythm 38% of the time with HR ranging from 53 to 158 bpm with an average HR 92 bpm.   10 runs of NSVT, longest lasting 15 beats.   No AV block or pauses. <1% PVC burden.   Patient triggered events correlated with atrial fibrillation.  Echo 02/2023:   1. Left ventricular ejection fraction, by estimation, is 40 to 45%. The  left ventricle has mildly decreased function. The left ventricle  demonstrates global hypokinesis. There is moderate left ventricular  hypertrophy. Left ventricular diastolic function   could not be evaluated.   2. Right ventricular systolic function is normal. The right ventricular  size is normal. There is normal pulmonary artery systolic pressure. The  estimated right ventricular systolic pressure is 28.0 mmHg.   3. Right atrial size was moderately dilated.   4. The mitral valve is normal in structure. Mild mitral valve  regurgitation. No evidence of mitral stenosis.   5. The aortic valve is tricuspid. There is mild calcification of the  aortic valve. Aortic valve regurgitation is not visualized. No aortic  stenosis is present.   6. The inferior vena cava is normal in size with greater than 50%  respiratory variability, suggesting right atrial pressure of 3 mmHg.   Comparison(s): No prior Echocardiogram.  Risk Assessment/Calculations:   CHA2DS2-VASc Score = 4  This indicates a 4.8% annual risk of stroke. The patient's score is based upon: CHF History: 1 HTN History: 1 Diabetes History: 0 Stroke History: 0 Vascular Disease History: 0 Age Score: 1 Gender Score: 1   The 10-year ASCVD risk score (Arnett DK, et al., 2019) is: 6.3%    Values used to calculate the score:     Age: 39 years     Sex: Female     Is Non-Hispanic African American: Yes     Diabetic: No     Tobacco smoker: No     Systolic Blood Pressure: 120 mmHg     Is BP treated: Yes     HDL Cholesterol: 47.9 mg/dL     Total Cholesterol: 135 mg/dL  Review of Systems    All other systems reviewed and are otherwise negative except as noted above.  Physical Exam    VS:  BP 120/62 (Cuff Size: Normal)   Pulse 94   Ht 5' 2 (1.575 m)   Wt 178 lb (80.7 kg)   SpO2 97%   BMI 32.56 kg/m  , BMI Body mass index is 32.56 kg/m.  Wt Readings from Last 3 Encounters:  01/02/24 178 lb (80.7 kg)  11/05/23 179 lb 9.6 oz (81.5 kg)  11/05/23 179 lb 9.6 oz (81.5 kg)    GEN: Obese, 68 y.o. female in no acute distress. HEENT: normal. Neck: Supple, no JVD, carotid bruits, or masses. Cardiac: S1/S2, irregular rhythm and regular rate, no murmurs, rubs, or gallops. No clubbing, cyanosis. Nonpitting edema along BLE, Radials2+ /PT 1+ and equal bilaterally. Hemodialysis port located on patient's right upper anterior chest.  Respiratory:  Respirations regular and unlabored, clear to auscultation bilaterally. MS: No deformity or atrophy. Skin: Warm and dry, no rash. Neuro:  Strength and sensation are intact. Psych: Normal affect.  Assessment & Plan   HFmrEF, NICM, lymphedema Stage C, NYHA class I-II symptoms. Echo 02/2023 revealed EF 40-45%, most likely d/t tachy-mediated from A-fib with RVR. Lasix  previously d/c and held d/t AKI on CKD. GDMT limited d/t kidney disease. No medication changes at this time.  Recommended low salt, heart healthy diet encouraged. Significant improvement in lymphedema with dialysis.  ED precautions discussed. Will consider updating limited Echo at next OV.    PAF, long term anticoagulation Denies any tachycardia or palpitations. HR controlled with A-fib. Continue metoprolol  and follow-up at coumadin  clinic as scheduled,    ESRD on HD  Continues  on HD - MWF scheduled. Avoid nephrotoxic agents.  Encourage adequate hydration.  No medication changes at this time.  Continue follow-up with nephrology and PCP.   HTN BP stable. Goal SBP < 140. Continue Lopressor . GDMT limited at this time d/t her ESRD. Follow-up with Nephrology and appreciate recs. Given BP log and salty six and discussed to monitor BP at home at least 2 hours after medications and sitting for 5-10 minutes.     Disposition: Will provide refills per her request. Follow up in 4-6 months with Vishnu P Mallipeddi, MD or APP.  Signed, Almarie Crate, NP 01/02/2024, 12:45 PM [facility]

## 2024-01-02 NOTE — Patient Instructions (Signed)
 Take warfarin 3 tablets tonight, 2 tablets tomorrow night then continue 1 tablet daily except 2 tablets on Tuesdays, Thursdays and Saturdays Recheck INR in 2 wks

## 2024-01-06 ENCOUNTER — Telehealth: Payer: Self-pay

## 2024-01-06 DIAGNOSIS — N186 End stage renal disease: Secondary | ICD-10-CM | POA: Diagnosis not present

## 2024-01-06 DIAGNOSIS — Z992 Dependence on renal dialysis: Secondary | ICD-10-CM | POA: Diagnosis not present

## 2024-01-06 NOTE — Telephone Encounter (Signed)
 Left message to call back patient needs to be scheduled for a nurse visit in 1-2 weeks for EKG.

## 2024-01-06 NOTE — Telephone Encounter (Signed)
-----   Message from Vishnu P Mallipeddi sent at 01/03/2024 12:25 PM EST ----- Please bring her back for EKG in 1-2 weeks. If she continues to be in Afib, she will benefit from TEE guided DCCV due to CDM with LVEF 45%. If EKG shows NSR, she needs EP referral for PVI. However after DCCV, she should not have any interruption in [facility] for 4 weeks. Any vascular procedures for graft or AV fistula will need to be postponed for 4 weeks after DCCV. ----- Message ----- From: Miriam Norris, NP Sent: 01/02/2024  12:50 PM EST To: Patricia JONELLE Pines, MD; Patricia SHAUNNA Maywood, MD  Doing well. Still has HD port to left anterior chest. Will f/u in 4-6 months.   Thank you!   Best,  Norris Miriam, NP

## 2024-01-09 ENCOUNTER — Telehealth (HOSPITAL_COMMUNITY): Payer: Self-pay | Admitting: *Deleted

## 2024-01-09 NOTE — Telephone Encounter (Signed)
Spoke with Denyse Amass at [facility] regarding faxed request for dialysis catheter removal. Informed him this was placed by IR so they would need to be the ones to remove. He expressed understanding.

## 2024-01-14 ENCOUNTER — Other Ambulatory Visit (HOSPITAL_COMMUNITY): Payer: Self-pay | Admitting: Nurse Practitioner

## 2024-01-14 DIAGNOSIS — N186 End stage renal disease: Secondary | ICD-10-CM

## 2024-01-16 ENCOUNTER — Ambulatory Visit: Payer: No Typology Code available for payment source | Attending: Internal Medicine | Admitting: *Deleted

## 2024-01-16 DIAGNOSIS — Z5181 Encounter for therapeutic drug level monitoring: Secondary | ICD-10-CM | POA: Diagnosis not present

## 2024-01-16 DIAGNOSIS — I4891 Unspecified atrial fibrillation: Secondary | ICD-10-CM | POA: Diagnosis not present

## 2024-01-16 LAB — POCT INR: INR: 1.2 — AB (ref 2.0–3.0)

## 2024-01-16 MED ORDER — WARFARIN SODIUM 2.5 MG PO TABS: ORAL_TABLET | ORAL | 3 refills | Status: DC

## 2024-01-16 NOTE — Patient Instructions (Signed)
Increase warfarin to 2 tablets daily except 1 tablet on Tuesdays and Saturdays Recheck 1 wk

## 2024-01-22 ENCOUNTER — Ambulatory Visit (INDEPENDENT_AMBULATORY_CARE_PROVIDER_SITE_OTHER): Payer: No Typology Code available for payment source | Admitting: Family Medicine

## 2024-01-22 VITALS — BP 122/68 | HR 83 | Temp 97.9°F | Ht 62.0 in | Wt 181.0 lb

## 2024-01-22 DIAGNOSIS — W57XXXA Bitten or stung by nonvenomous insect and other nonvenomous arthropods, initial encounter: Secondary | ICD-10-CM | POA: Diagnosis not present

## 2024-01-22 DIAGNOSIS — Z1231 Encounter for screening mammogram for malignant neoplasm of breast: Secondary | ICD-10-CM | POA: Diagnosis not present

## 2024-01-22 DIAGNOSIS — I4891 Unspecified atrial fibrillation: Secondary | ICD-10-CM | POA: Diagnosis not present

## 2024-01-22 DIAGNOSIS — Z992 Dependence on renal dialysis: Secondary | ICD-10-CM | POA: Diagnosis not present

## 2024-01-22 DIAGNOSIS — S80861A Insect bite (nonvenomous), right lower leg, initial encounter: Secondary | ICD-10-CM | POA: Diagnosis not present

## 2024-01-22 DIAGNOSIS — J4520 Mild intermittent asthma, uncomplicated: Secondary | ICD-10-CM | POA: Diagnosis not present

## 2024-01-22 DIAGNOSIS — E785 Hyperlipidemia, unspecified: Secondary | ICD-10-CM | POA: Diagnosis not present

## 2024-01-22 DIAGNOSIS — N186 End stage renal disease: Secondary | ICD-10-CM

## 2024-01-22 DIAGNOSIS — J309 Allergic rhinitis, unspecified: Secondary | ICD-10-CM | POA: Diagnosis not present

## 2024-01-22 MED ORDER — TRIAMCINOLONE ACETONIDE 0.1 % EX CREA: 1.0000 | TOPICAL_CREAM | Freq: Two times a day (BID) | CUTANEOUS | 0 refills | Status: DC

## 2024-01-22 NOTE — Progress Notes (Signed)
Subjective:  Patient ID: Patricia Wu, female    DOB: 1956/02/05  Age: 68 y.o. MRN: [id number]  CC:  Chief Complaint  Patient presents with   Medical Management of Chronic Issues    Patient is well,    Rash    Pt notes red spot on her Rt ankle and has been there about 1 week, no itching just  a slight tingle to the area, no blisters noted at this time      HPI Patricia Wu presents for   Rash of ankles Noticed past week - both sides. Burning sensation. Nurse at dialysis applied some cream on it. No other areas of rash.  Has tried cortisone 10 at home - 2 times per day - some relief.  Feels better now. Does not remember being bit by anything. Does hang clothes on line outside - not sure if something may have bit her then. No other areas on body.   Stage IV chronic kidney disease on hemodialysis Followed by nephrology, hemodialysis Monday Wednesday Friday. Dialysis earlier today. Typical fatigue after HD.   Atrial fibrillation/HFrEF Followed by cardiology, on a beta-blocker for rate control and Coumadin for anticoagulation.no CP/palpitations.  Appt with cardiology 1/9. NYHA calss I-II symptoms. EF 40-45% in 2024. Lasix d/c previously d/t AKI on CKD. Possible repeat echo next visit, no med changes.   BP Readings from Last 3 Encounters:  01/22/24 122/68  01/02/24 120/62  11/05/23 138/80   Hyperlipidemia: Lipitor 10 mg daily without new myalgias or side effects. Lab Results  Component Value Date   CHOL 135 07/18/2023   HDL 47.90 07/18/2023   LDLCALC 76 07/18/2023   TRIG 56.0 07/18/2023   CHOLHDL 3 07/18/2023   Lab Results  Component Value Date   ALT 26 07/18/2023   AST 22 07/18/2023   GGT 247 (H) 10/11/2020   ALKPHOS 285 (H) 07/18/2023   BILITOT 0.7 07/18/2023   Allergic rhinitis with bronchospasm: Albuterol if needed.  Flonase for allergies as needed. Neither needed recently.   HM: Agrees to mammogram referral  History Patient Active Problem List    Diagnosis Date Noted   Class 2 obesity due to excess calories with body mass index (BMI) of 39.0 to 39.9 in adult 05/10/2023   Acute on chronic combined systolic and diastolic CHF (congestive heart failure) (HCC) 05/09/2023   Hypervolemia associated with renal insufficiency 05/07/2023   ESRD needing dialysis (HCC) 05/07/2023   History of Coumadin therapy 03/26/2023   Atrial fibrillation, controlled (HCC) 03/04/2023   Asthma, chronic 03/04/2023   Chronic kidney disease, stage IV (severe) (HCC) 02/18/2023   Anemia of chronic renal failure 02/18/2023   Acute-on-chronic kidney injury (HCC) 02/15/2022   Hyperlipidemia 02/15/2022   Obesity (BMI 30-39.9) 02/15/2022   Colon cancer screening 09/21/2020   Elevated alkaline phosphatase level 09/21/2020   Essential hypertension 02/18/2018   Elevated brain natriuretic peptide (BNP) level 02/18/2018   CKD (chronic kidney disease) stage 3, GFR 30-59 ml/min (HCC) 02/18/2018   Past Medical History:  Diagnosis Date   CKD (chronic kidney disease)    Hypercholesteremia    Hypertension    Noncompliance with medication regimen    Past Surgical History:  Procedure Laterality Date   A/V FISTULAGRAM Left 08/15/2023   Procedure: A/V Fistulagram;  Surgeon: Cephus Shelling, MD;  [facility];  Service: Cardiovascular;  Laterality: Left;   AV FISTULA PLACEMENT Left 06/18/2023   Procedure: INSERTION OF LEFT ARM ARTERIOVENOUS (AV) FISTULA;  Surgeon: Gretta Began  F, MD;  Location: [facility];  Service: Vascular;  Laterality: Left;   COLONOSCOPY WITH PROPOFOL N/A 10/17/2020   Procedure: COLONOSCOPY WITH PROPOFOL;  Surgeon: Lanelle Bal, DO;  Location: [facility];  Service: Endoscopy;  Laterality: N/A;  1:00pm   IR FLUORO GUIDE CV LINE RIGHT  05/09/2023   IR US GUIDE VASC ACCESS RIGHT  05/10/2023   POLYPECTOMY  10/17/2020   Procedure: POLYPECTOMY;  Surgeon: Lanelle Bal, DO;  Location: [facility];  Service: Endoscopy;;   Allergies   Allergen Reactions   Ibuprofen Other (See Comments)    pcp said raises her BP   Prior to Admission medications   Medication Sig Start Date End Date Taking? Authorizing Provider  acetaminophen (TYLENOL) 325 MG tablet Take 325-650 mg by mouth every 6 (six) hours as needed (pain.).    Yes [provider]  albuterol (VENTOLIN HFA) 108 (90 Base) MCG/ACT inhaler Inhale 1-2 puffs into the lungs every 6 (six) hours as needed for wheezing or shortness of breath. 07/18/23  Yes Shade Flood, MD  atorvastatin (LIPITOR) 10 MG tablet TAKE 1 TABLET(10 MG) BY MOUTH DAILY 07/18/23  Yes Shade Flood, MD  B Complex-C-Folic Acid (RENA-VITE RX) 1 MG TABS Take 1 tablet by mouth daily. 08/29/23  Yes [provider]  calcitRIOL (ROCALTROL) 0.25 MCG capsule Take 0.5 mcg by mouth daily.   Yes [provider]  calcium acetate (PHOSLO) 667 MG capsule Take 1,334 mg by mouth 3 (three) times daily. 09/25/23  Yes [provider]  furosemide (LASIX) 40 MG tablet Take 1 tablet (40 mg total) by mouth as needed for edema (swelling). 08/05/23  Yes Sharlene Dory, NP  irbesartan (AVAPRO) 150 MG tablet Take 1 tablet (150 mg total) by mouth daily. 01/02/24  Yes Sharlene Dory, NP  metoprolol tartrate (LOPRESSOR) 100 MG tablet Take 1 tablet (100 mg total) by mouth 2 (two) times daily. 01/02/24  Yes Sharlene Dory, NP  metoprolol tartrate (LOPRESSOR) 25 MG tablet Take 1 tablet (25 mg total) by mouth 2 (two) times daily. 01/02/24  Yes Sharlene Dory, NP  oxyCODONE-acetaminophen (PERCOCET) 5-325 MG tablet Take 1 tablet by mouth every 6 (six) hours as needed for severe pain. 06/18/23  Yes Early, Kristen Loader, MD  warfarin (COUMADIN) 2.5 MG tablet TAKE 1 TO 2 TABLETS BY MOUTH DAILY AS DIRECTED BY COUMADIN CLINIC STOP ELIQUIS 01/16/24  Yes Mallipeddi, Vishnu P, MD   Social History   Socioeconomic History   Marital status: Married    Spouse name: Not on file   Number of children: 2   Years of education: Not  on file   Highest education level: Not on file  Occupational History   Not on file  Tobacco Use   Smoking status: Never    Passive exposure: Never   Smokeless tobacco: Never  Vaping Use   Vaping status: Never Used  Substance and Sexual Activity   Alcohol use: No   Drug use: No   Sexual activity: Yes  Other Topics Concern   Not on file  Social History Narrative   Not on file   Social Drivers of Health   Financial Resource Strain: Low Risk  (12/03/2023)   Overall Financial Resource Strain (CARDIA)    Difficulty of Paying Living Expenses: Not hard at all  Food Insecurity: No Food Insecurity (12/03/2023)   Hunger Vital Sign    Worried About Running Out of Food in the Last Year: Never true    Ran Out  of Food in the Last Year: Never true  Transportation Needs: No Transportation Needs (12/03/2023)   PRAPARE - Administrator, Civil Service (Medical): No    Lack of Transportation (Non-Medical): No  Physical Activity: Insufficiently Active (12/03/2023)   Exercise Vital Sign    Days of Exercise per Week: 2 days    Minutes of Exercise per Session: 30 min  Stress: No Stress Concern Present (12/03/2023)   [employer] of Occupational Health - Occupational Stress Questionnaire    Feeling of Stress : Not at all  Social Connections: Moderately Integrated (12/03/2023)   Social Connection and Isolation Panel [NHANES]    Frequency of Communication with Friends and Family: Three times a week    Frequency of Social Gatherings with Friends and Family: Three times a week    Attends Religious Services: More than 4 times per year    Active Member of Clubs or Organizations: No    Attends Banker Meetings: Never    Marital Status: Married  Catering manager Violence: Not At Risk (12/03/2023)   Humiliation, Afraid, Rape, and Kick questionnaire    Fear of Current or Ex-Partner: No    Emotionally Abused: No    Physically Abused: No    Sexually Abused: No    Review  of Systems  Per HPI Objective:   Vitals:   01/22/24 1453  BP: 122/68  Pulse: 83  Temp: 97.9 F (36.6 C)  TempSrc: Temporal  SpO2: 98%  Weight: 181 lb (82.1 kg)  Height: 5\' 2"  (1.575 m)     Physical Exam Constitutional:      Appearance: She is well-developed.  HENT:     Head: Normocephalic and atraumatic.     Right Ear: External ear normal.     Left Ear: External ear normal.  Eyes:     Conjunctiva/sclera: Conjunctivae normal.     Pupils: Pupils are equal, round, and reactive to light.  Neck:     Thyroid: No thyromegaly.  Cardiovascular:     Rate and Rhythm: Normal rate and regular rhythm.     Heart sounds: Normal heart sounds. No murmur heard. Pulmonary:     Effort: Pulmonary effort is normal. No respiratory distress.     Breath sounds: Normal breath sounds. No wheezing.  Abdominal:     General: Bowel sounds are normal.     Palpations: Abdomen is soft.     Tenderness: There is no abdominal tenderness.  Musculoskeletal:        General: No tenderness. Normal range of motion.     Cervical back: Normal range of motion and neck supple.     Comments: Trace to 1+ bilateral lower extremity edema ankles, but few rounded, indurated areas with discomfort on palpation, slight warmth without vascular streaks.  Calves nontender, negative Homans. Few on each side - see photos.   Lymphadenopathy:     Cervical: No cervical adenopathy.  Skin:    General: Skin is warm and dry.     Findings: Rash (Multiple slight indurated rounded lesions on lower legs bilaterally, see photos) present.  Neurological:     Mental Status: She is alert and oriented to person, place, and time.  Psychiatric:        Behavior: Behavior normal.        Thought Content: Thought content normal.           Assessment & Plan:  Patricia Wu is a 68 y.o. female . ESRD on hemodialysis (HCC)  -Managed by  nephrology with ongoing follow-up, hemodialysis.  Continue same.  Hyperlipidemia, unspecified  hyperlipidemia type - Plan: Lipid panel  -Tolerating current regimen, check labs and adjust plan accordingly.  Allergic rhinitis, unspecified seasonality, unspecified trigger Mild intermittent chronic asthma without complication  -Well-controlled without recent need for albuterol or Flonase recently but does have if needed for flare of symptoms.  Atrial fibrillation with RVR (HCC)  -Tolerating current med regimen, follow-up with cardiology as planned.  Insect bite of lower leg, unspecified laterality, initial encounter - Plan: triamcinolone cream (KENALOG) 0.1 %  -Multiple rounded areas, suspected insect bites.  Topical treatment with triamcinolone as needed with RTC precautions.  Encounter for screening mammogram for malignant neoplasm of breast - Plan: MM Digital Screening   Meds ordered this encounter  Medications   triamcinolone cream (KENALOG) 0.1 %    Sig: Apply 1 Application topically 2 (two) times daily.    Dispense:  30 g    Refill:  0   Patient Instructions  I agree that the areas on your lower legs could be insect bites.  Okay to use the triamcinolone steroid cream I prescribed today up to twice per day but if those areas are not improving into next week or any worsening symptoms please be seen sooner. I will check cholesterol levels today, no med changes at this time.  Keep follow-up with specialists as planned and take care!    Signed,   Meredith Staggers, MD [facility], [facility] 01/22/24 3:57 PM

## 2024-01-22 NOTE — Patient Instructions (Signed)
I agree that the areas on your lower legs could be insect bites.  Okay to use the triamcinolone steroid cream I prescribed today up to twice per day but if those areas are not improving into next week or any worsening symptoms please be seen sooner. I will check cholesterol levels today, no med changes at this time.  Keep follow-up with specialists as planned and take care!

## 2024-01-23 ENCOUNTER — Ambulatory Visit: Payer: No Typology Code available for payment source | Attending: Internal Medicine | Admitting: *Deleted

## 2024-01-23 DIAGNOSIS — Z5181 Encounter for therapeutic drug level monitoring: Secondary | ICD-10-CM

## 2024-01-23 DIAGNOSIS — I4891 Unspecified atrial fibrillation: Secondary | ICD-10-CM

## 2024-01-23 LAB — LIPID PANEL
Cholesterol: 165 mg/dL (ref 0–200)
HDL: 60.3 mg/dL (ref >39.00)
LDL Cholesterol: 91 mg/dL (ref 0–99)
NonHDL: 104.34
Total CHOL/HDL Ratio: 3
Triglycerides: 66 mg/dL (ref 0.0–149.0)
VLDL: 13.2 mg/dL (ref 0.0–40.0)

## 2024-01-23 LAB — POCT INR: INR: 1.3 — AB (ref 2.0–3.0)

## 2024-01-23 NOTE — Patient Instructions (Signed)
Take warfarin 3 tablets tonight then increase dose to 2 tablets daily,. Recheck 1 wk

## 2024-01-24 ENCOUNTER — Encounter: Payer: Self-pay | Admitting: Family Medicine

## 2024-01-24 DIAGNOSIS — N186 End stage renal disease: Secondary | ICD-10-CM | POA: Diagnosis not present

## 2024-01-24 DIAGNOSIS — Z992 Dependence on renal dialysis: Secondary | ICD-10-CM | POA: Diagnosis not present

## 2024-01-25 DIAGNOSIS — D631 Anemia in chronic kidney disease: Secondary | ICD-10-CM | POA: Diagnosis not present

## 2024-01-25 DIAGNOSIS — N186 End stage renal disease: Secondary | ICD-10-CM | POA: Diagnosis not present

## 2024-01-25 DIAGNOSIS — N2581 Secondary hyperparathyroidism of renal origin: Secondary | ICD-10-CM | POA: Diagnosis not present

## 2024-01-25 DIAGNOSIS — D509 Iron deficiency anemia, unspecified: Secondary | ICD-10-CM | POA: Diagnosis not present

## 2024-01-25 DIAGNOSIS — Z992 Dependence on renal dialysis: Secondary | ICD-10-CM | POA: Diagnosis not present

## 2024-01-28 ENCOUNTER — Ambulatory Visit (HOSPITAL_COMMUNITY)
Admission: RE | Admit: 2024-01-28 | Discharge: 2024-01-28 | Disposition: A | Discharge status: Home / Self Care | Payer: No Typology Code available for payment source | Source: Ambulatory Visit | Attending: Nurse Practitioner | Admitting: Nurse Practitioner

## 2024-01-28 DIAGNOSIS — N186 End stage renal disease: Secondary | ICD-10-CM | POA: Insufficient documentation

## 2024-01-28 DIAGNOSIS — N289 Disorder of kidney and ureter, unspecified: Secondary | ICD-10-CM | POA: Diagnosis not present

## 2024-01-28 DIAGNOSIS — Z4901 Encounter for fitting and adjustment of extracorporeal dialysis catheter: Secondary | ICD-10-CM | POA: Insufficient documentation

## 2024-01-28 HISTORY — PX: IR REMOVAL TUN CV CATH W/O FL: IMG2289

## 2024-01-28 MED ORDER — LIDOCAINE HCL (PF) 1 % IJ SOLN: 10.0000 mL | Freq: Once | INTRAMUSCULAR | Status: DC

## 2024-01-28 MED ORDER — LIDOCAINE HCL 1 % IJ SOLN
INTRAMUSCULAR | Status: AC
  Filled 2024-01-28: qty 20

## 2024-01-28 NOTE — Procedures (Signed)
PROCEDURE SUMMARY:  Successful removal of tunneled hemodialysis catheter.  Patient tolerated well.  EBL < 5 mL  See full dictation in Imaging for details.  Hameed Kolar S Adryanna Friedt PA-C 01/28/2024 9:24 AM

## 2024-02-03 DIAGNOSIS — Z992 Dependence on renal dialysis: Secondary | ICD-10-CM | POA: Diagnosis not present

## 2024-02-03 DIAGNOSIS — N186 End stage renal disease: Secondary | ICD-10-CM | POA: Diagnosis not present

## 2024-02-04 ENCOUNTER — Telehealth: Payer: Self-pay

## 2024-02-04 NOTE — Telephone Encounter (Signed)
Patient was in the grocery store when I had called, she will either check MyChart later or wait for [place] to call again

## 2024-02-04 NOTE — Telephone Encounter (Signed)
-----   Message from Shade Flood sent at 01/30/2024  1:14 PM EST ----- Results sent by MyChart, but appears patient has not yet reviewed those results.  Please call and make sure they have either seen note or discuss result note.  Thanks.

## 2024-02-05 NOTE — Telephone Encounter (Signed)
Pt has been notified.

## 2024-02-06 ENCOUNTER — Ambulatory Visit: Payer: No Typology Code available for payment source | Attending: Internal Medicine | Admitting: *Deleted

## 2024-02-06 DIAGNOSIS — I4891 Unspecified atrial fibrillation: Secondary | ICD-10-CM | POA: Diagnosis not present

## 2024-02-06 DIAGNOSIS — Z5181 Encounter for therapeutic drug level monitoring: Secondary | ICD-10-CM

## 2024-02-06 DIAGNOSIS — Z9229 Personal history of other drug therapy: Secondary | ICD-10-CM

## 2024-02-06 LAB — POCT INR: INR: 1.4 — AB (ref 2.0–3.0)

## 2024-02-06 NOTE — Patient Instructions (Signed)
Take warfarin 3 tablets tonight then increase dose to 2 tablets daily except 3 tablets on Mondays, Wednesdays and Fridays Recheck 2 wk

## 2024-02-20 ENCOUNTER — Ambulatory Visit: Payer: No Typology Code available for payment source | Attending: Internal Medicine | Admitting: *Deleted

## 2024-02-20 DIAGNOSIS — Z5181 Encounter for therapeutic drug level monitoring: Secondary | ICD-10-CM

## 2024-02-20 DIAGNOSIS — I4891 Unspecified atrial fibrillation: Secondary | ICD-10-CM

## 2024-02-20 LAB — POCT INR: INR: 2.6 (ref 2.0–3.0)

## 2024-02-20 MED ORDER — WARFARIN SODIUM 2.5 MG PO TABS: ORAL_TABLET | ORAL | 3 refills | Status: DC

## 2024-02-20 NOTE — Patient Instructions (Signed)
 Continue warfarin 2 tablets daily except 3 tablets on Mondays, Wednesdays and Fridays Recheck 3 wks

## 2024-02-21 DIAGNOSIS — N186 End stage renal disease: Secondary | ICD-10-CM | POA: Diagnosis not present

## 2024-02-21 DIAGNOSIS — Z992 Dependence on renal dialysis: Secondary | ICD-10-CM | POA: Diagnosis not present

## 2024-02-22 DIAGNOSIS — N186 End stage renal disease: Secondary | ICD-10-CM | POA: Diagnosis not present

## 2024-02-22 DIAGNOSIS — D509 Iron deficiency anemia, unspecified: Secondary | ICD-10-CM | POA: Diagnosis not present

## 2024-02-22 DIAGNOSIS — D631 Anemia in chronic kidney disease: Secondary | ICD-10-CM | POA: Diagnosis not present

## 2024-02-22 DIAGNOSIS — N2581 Secondary hyperparathyroidism of renal origin: Secondary | ICD-10-CM | POA: Diagnosis not present

## 2024-02-22 DIAGNOSIS — Z992 Dependence on renal dialysis: Secondary | ICD-10-CM | POA: Diagnosis not present

## 2024-03-02 DIAGNOSIS — N186 End stage renal disease: Secondary | ICD-10-CM | POA: Diagnosis not present

## 2024-03-02 DIAGNOSIS — Z992 Dependence on renal dialysis: Secondary | ICD-10-CM | POA: Diagnosis not present

## 2024-03-09 ENCOUNTER — Encounter (HOSPITAL_COMMUNITY): Payer: Self-pay | Admitting: Family Medicine

## 2024-03-10 ENCOUNTER — Ambulatory Visit: Payer: No Typology Code available for payment source | Attending: Internal Medicine | Admitting: *Deleted

## 2024-03-10 DIAGNOSIS — I4891 Unspecified atrial fibrillation: Secondary | ICD-10-CM

## 2024-03-10 DIAGNOSIS — Z5181 Encounter for therapeutic drug level monitoring: Secondary | ICD-10-CM

## 2024-03-10 LAB — POCT INR: INR: 2 (ref 2.0–3.0)

## 2024-03-10 NOTE — Patient Instructions (Signed)
 Continue warfarin 2 tablets daily except 3 tablets on Mondays, Wednesdays and Fridays Recheck 4 wks

## 2024-03-11 ENCOUNTER — Encounter (HOSPITAL_COMMUNITY): Payer: Self-pay | Admitting: Family Medicine

## 2024-03-23 DIAGNOSIS — N186 End stage renal disease: Secondary | ICD-10-CM | POA: Diagnosis not present

## 2024-03-23 DIAGNOSIS — Z992 Dependence on renal dialysis: Secondary | ICD-10-CM | POA: Diagnosis not present

## 2024-03-25 DIAGNOSIS — N186 End stage renal disease: Secondary | ICD-10-CM | POA: Diagnosis not present

## 2024-03-25 DIAGNOSIS — D631 Anemia in chronic kidney disease: Secondary | ICD-10-CM | POA: Diagnosis not present

## 2024-03-25 DIAGNOSIS — D509 Iron deficiency anemia, unspecified: Secondary | ICD-10-CM | POA: Diagnosis not present

## 2024-03-25 DIAGNOSIS — Z992 Dependence on renal dialysis: Secondary | ICD-10-CM | POA: Diagnosis not present

## 2024-03-25 DIAGNOSIS — N2581 Secondary hyperparathyroidism of renal origin: Secondary | ICD-10-CM | POA: Diagnosis not present

## 2024-03-26 ENCOUNTER — Ambulatory Visit (HOSPITAL_COMMUNITY)
Admission: RE | Admit: 2024-03-26 | Discharge: 2024-03-26 | Disposition: A | Discharge status: Home / Self Care | Payer: No Typology Code available for payment source | Source: Ambulatory Visit | Attending: Family Medicine | Admitting: Family Medicine

## 2024-03-26 DIAGNOSIS — Z1231 Encounter for screening mammogram for malignant neoplasm of breast: Secondary | ICD-10-CM | POA: Insufficient documentation

## 2024-04-07 ENCOUNTER — Ambulatory Visit: Attending: Internal Medicine | Admitting: *Deleted

## 2024-04-07 DIAGNOSIS — I4891 Unspecified atrial fibrillation: Secondary | ICD-10-CM

## 2024-04-07 DIAGNOSIS — Z5181 Encounter for therapeutic drug level monitoring: Secondary | ICD-10-CM

## 2024-04-07 LAB — POCT INR: INR: 3.4 — AB (ref 2.0–3.0)

## 2024-04-07 NOTE — Patient Instructions (Signed)
 Hold warfarin tonight then resume 2 tablets daily except 3 tablets on Mondays, Wednesdays and Fridays Recheck 3 wks

## 2024-04-22 DIAGNOSIS — N186 End stage renal disease: Secondary | ICD-10-CM | POA: Diagnosis not present

## 2024-04-22 DIAGNOSIS — Z992 Dependence on renal dialysis: Secondary | ICD-10-CM | POA: Diagnosis not present

## 2024-04-23 DIAGNOSIS — D509 Iron deficiency anemia, unspecified: Secondary | ICD-10-CM | POA: Diagnosis not present

## 2024-04-23 DIAGNOSIS — D631 Anemia in chronic kidney disease: Secondary | ICD-10-CM | POA: Diagnosis not present

## 2024-04-23 DIAGNOSIS — N2581 Secondary hyperparathyroidism of renal origin: Secondary | ICD-10-CM | POA: Diagnosis not present

## 2024-04-23 DIAGNOSIS — Z992 Dependence on renal dialysis: Secondary | ICD-10-CM | POA: Diagnosis not present

## 2024-04-23 DIAGNOSIS — N186 End stage renal disease: Secondary | ICD-10-CM | POA: Diagnosis not present

## 2024-04-24 ENCOUNTER — Other Ambulatory Visit (HOSPITAL_COMMUNITY): Payer: Self-pay

## 2024-04-24 ENCOUNTER — Telehealth: Payer: Self-pay

## 2024-04-24 DIAGNOSIS — N186 End stage renal disease: Secondary | ICD-10-CM | POA: Diagnosis not present

## 2024-04-24 DIAGNOSIS — Z992 Dependence on renal dialysis: Secondary | ICD-10-CM | POA: Diagnosis not present

## 2024-04-24 NOTE — Telephone Encounter (Signed)
 Pharmacy Patient Advocate Encounter  Insurance verification completed.   The patient is insured through [employer]   Ran test claim for ELIQUIS . Currently a quantity of 60 is a 30 day supply and the co-pay is $453.50 . The current 30 day co-pay is, $453.50.  No PA needed at this time.  -DEDUCTIBLE REMAINING  This test claim was processed through [facility]- copay amounts may vary at other pharmacies due to pharmacy/plan contracts, or as the patient moves through the different stages of their insurance plan.

## 2024-04-28 ENCOUNTER — Ambulatory Visit: Attending: Internal Medicine | Admitting: *Deleted

## 2024-04-28 DIAGNOSIS — Z5181 Encounter for therapeutic drug level monitoring: Secondary | ICD-10-CM | POA: Diagnosis not present

## 2024-04-28 DIAGNOSIS — I4891 Unspecified atrial fibrillation: Secondary | ICD-10-CM | POA: Diagnosis not present

## 2024-04-28 LAB — POCT INR: INR: 2.2 (ref 2.0–3.0)

## 2024-04-28 NOTE — Patient Instructions (Signed)
 Continue warfarin 2 tablets daily except 3 tablets on Mondays, Wednesdays and Fridays Recheck 4 wks

## 2024-05-11 DIAGNOSIS — N186 End stage renal disease: Secondary | ICD-10-CM | POA: Diagnosis not present

## 2024-05-11 DIAGNOSIS — E1122 Type 2 diabetes mellitus with diabetic chronic kidney disease: Secondary | ICD-10-CM | POA: Diagnosis not present

## 2024-05-11 DIAGNOSIS — Z794 Long term (current) use of insulin: Secondary | ICD-10-CM | POA: Diagnosis not present

## 2024-05-11 DIAGNOSIS — Z992 Dependence on renal dialysis: Secondary | ICD-10-CM | POA: Diagnosis not present

## 2024-05-13 ENCOUNTER — Other Ambulatory Visit: Payer: Self-pay

## 2024-05-13 ENCOUNTER — Ambulatory Visit (HOSPITAL_COMMUNITY): Attending: Nurse Practitioner | Admitting: Physical Therapy

## 2024-05-13 DIAGNOSIS — S81801S Unspecified open wound, right lower leg, sequela: Secondary | ICD-10-CM | POA: Insufficient documentation

## 2024-05-13 DIAGNOSIS — M79661 Pain in right lower leg: Secondary | ICD-10-CM | POA: Insufficient documentation

## 2024-05-13 NOTE — Therapy (Signed)
 OUTPATIENT PHYSICAL THERAPY Wound  EVALUATION   Patient Name: ELYSSE Wu MRN: [id number] DOB:[birth year], 68 y.o., female Today's Date: 05/13/2024   PCP: Caro Christmas REFERRING PROVIDER: Hyler, Bethene Brought, NP  END OF SESSION:  PT End of Session - 05/13/24 1625     Visit Number 1    Number of Visits 8    Date for PT Re-Evaluation 06/12/24    Authorization Type [employer]    Progress Note Due on Visit 8    PT Start Time 1545    PT Stop Time 1616    PT Time Calculation (min) 31 min    Equipment Utilized During Treatment Gait belt    Activity Tolerance Patient tolerated treatment well    Behavior During Therapy WFL for tasks assessed/performed             Past Medical History:  Diagnosis Date   CKD (chronic kidney disease)    Hypercholesteremia    Hypertension    Noncompliance with medication regimen    Past Surgical History:  Procedure Laterality Date   A/V FISTULAGRAM Left 08/15/2023   Procedure: A/V Fistulagram;  Surgeon: Young Hensen, MD;  Location: [facility];  Service: Cardiovascular;  Laterality: Left;   AV FISTULA PLACEMENT Left 06/18/2023   Procedure: INSERTION OF LEFT ARM ARTERIOVENOUS (AV) FISTULA;  Surgeon: Mayo Speck, MD;  Location: [facility];  Service: Vascular;  Laterality: Left;   COLONOSCOPY WITH PROPOFOL  N/A 10/17/2020   Procedure: COLONOSCOPY WITH PROPOFOL ;  Surgeon: Vinetta Greening, DO;  Location: [facility];  Service: Endoscopy;  Laterality: N/A;  1:00pm   IR FLUORO GUIDE CV LINE RIGHT  05/09/2023   IR REMOVAL TUN CV CATH W/O FL  01/28/2024   IR US  GUIDE VASC ACCESS RIGHT  05/10/2023   POLYPECTOMY  10/17/2020   Procedure: POLYPECTOMY;  Surgeon: Vinetta Greening, DO;  [facility]: [facility];  Service: Endoscopy;;   Patient Active Problem List   Diagnosis Date Noted   Class 2 obesity due to excess calories with body mass index (BMI) of 39.0 to 39.9 in adult 05/10/2023   Acute on chronic combined systolic and diastolic  CHF (congestive heart failure) (HCC) 05/09/2023   Hypervolemia associated with renal insufficiency 05/07/2023   ESRD needing dialysis (HCC) 05/07/2023   History of Coumadin  therapy 03/26/2023   Atrial fibrillation, controlled (HCC) 03/04/2023   Asthma, chronic 03/04/2023   Chronic kidney disease, stage IV (severe) (HCC) 02/18/2023   Anemia of chronic renal failure 02/18/2023   Acute-on-chronic kidney injury (HCC) 02/15/2022   Hyperlipidemia 02/15/2022   Obesity (BMI 30-39.9) 02/15/2022   Colon cancer screening 09/21/2020   Elevated alkaline phosphatase level 09/21/2020   Essential hypertension 02/18/2018   Elevated brain natriuretic peptide (BNP) level 02/18/2018   CKD (chronic kidney disease) stage 3, GFR 30-59 ml/min (HCC) 02/18/2018    ONSET DATE: 03/13/24  REFERRING DIAG: 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  She was given anti inflamation medication and antibiotics.    Patient and Family Stated Goals less pain and wound to heal.    Date of Onset 03/13/24    Prior Treatments self care    Pain Scale 0-10    Pain Score 8  Pain Type Acute pain    Pain Location Ankle    Pain Orientation Right;Medial    Pain Descriptors / Indicators Discomfort;Aching    Pain Onset Unable to tell    Patients Stated Pain Goal 0    Pain Intervention(s) Emotional support    Evaluation and Treatment Procedures Explained to Patient/Family Yes    Evaluation and Treatment Procedures agreed to    Wound Properties Date First Assessed: 05/13/24 Time First Assessed: 1550 Wound Type: Other (Comment) Location: Leg Location Orientation: Right;Medial Wound Description (Comments): black necrotic Present on Admission: Yes   Wound Image Images linked: 1     Dressing Type None    Dressing Changed New    Dressing Status None    Dressing Change Frequency PRN    Site / Wound Assessment Black;Dry    % Wound base Red or Granulating 0%    % Wound base Black/Eschar 100%    Peri-wound Assessment Black    Wound Length (cm) 2.8 cm    Wound Width (cm) 3.5 cm    Wound Depth (cm) --   unknown   Wound Surface Area (cm^2) 9.8 cm^2    Drainage Amount None    Treatment Cleansed;Debridement (Selective)    Selective Debridement (non-excisional) - Location wound base    Selective Debridement (non-excisional) - Tools Used Forceps;Scalpel    Selective Debridement (non-excisional) - Tissue Removed necrotic    Wound Therapy - Clinical Statement see below    Wound Therapy - Functional Problem List difficult to bath, dress    Factors Delaying/Impairing Wound Healing Infection - systemic/local;Polypharmacy;Other (comment)   on dialysis due to renal failure   Hydrotherapy Plan Debridement;Dressing change;Patient/family education;Other (comment)    Wound Therapy - Frequency 2X / week   fro 4 week s   Wound Therapy - Current Recommendations PT    Wound Plan debridement and dressing change as needed    Dressing  medihoney, 4x4, gauze and netting               PATIENT EDUCATION: Education details: keep dressing dry do not remove unless necessary  Person educated: Patient and Child(ren) Education method: Explanation Education comprehension: verbalized understanding   HOME EXERCISE PROGRAM: None    GOALS: Goals reviewed with patient? No  SHORT TERM GOALS: Target date: 05/27/24  Pt wound to be 80% granulated  Baseline: Goal status: INITIAL  2.  PT pain to be decreased to no greater than a 5/10 Baseline:  Goal status: INITIAL    LONG TERM GOALS: Target date: 06/11/23  Pt wound to be healed  Baseline:  Goal status: INITIAL  2.  Pt pain to be no greater than a 0 in her Rt LE  Baseline:  Goal status: INITIAL  3.  PT to have no difficulties  washing or dressing due to wound  Baseline:  Goal status: INITIAL    ASSESSMENT:  CLINICAL IMPRESSION: Patient is a 68 y.o. female who was seen today for physical therapy evaluation and treatment for Rt LE non healing wound.  The wound is painful to the touch and exhibits no granulating tissue at this time.  Ms. Gehret will benefit from skilled PT to debride the necrotic tissue to allow healing and decrease pain of her Right leg wound. .    OBJECTIVE IMPAIRMENTS: pain and decreased skin integrity .   ACTIVITY LIMITATIONS: bathing and dressing   PERSONAL FACTORS: Fitness, Time since onset of injury/illness/exacerbation, and 1 comorbidity: CKI on dialysis are also affecting patient's functional outcome.  REHAB POTENTIAL: Fair    CLINICAL DECISION MAKING: Evolving/moderate complexity  EVALUATION COMPLEXITY: Moderate  PLAN: PT FREQUENCY: 2x/week  PT DURATION: 4 weeks  PLANNED INTERVENTIONS: 97535- Self Care and 16109- Wound care (first 20 sq cm)  PLAN FOR NEXT SESSION: place warm compress on area in hopes of improving ability to debride as pt is very sensitive to debridement.   Leodis Rainwater, PT CLT [phone]  05/13/2024, 4:41 PM

## 2024-05-15 ENCOUNTER — Ambulatory Visit (HOSPITAL_COMMUNITY): Admitting: Physical Therapy

## 2024-05-20 ENCOUNTER — Ambulatory Visit (HOSPITAL_COMMUNITY): Admitting: Physical Therapy

## 2024-05-20 DIAGNOSIS — S81801S Unspecified open wound, right lower leg, sequela: Secondary | ICD-10-CM | POA: Diagnosis not present

## 2024-05-20 DIAGNOSIS — M79661 Pain in right lower leg: Secondary | ICD-10-CM

## 2024-05-20 NOTE — Therapy (Addendum)
 OUTPATIENT PHYSICAL THERAPY Wound Treatment Patient Name: Patricia Wu MRN: [id number] DOB:08/28/1956, 68 y.o., female Today's Date: 05/20/2024   PCP: Patricia Wu REFERRING PROVIDER: Hyler, Bethene Brought, NP  END OF SESSION:  PT End of Session - 05/20/24 1511     Visit Number 2    Number of Visits 8    Date for PT Re-Evaluation 06/12/24    Authorization Type [employer]    Progress Note Due on Visit 8    PT Start Time 1445    PT Stop Time 1511    PT Time Calculation (min) 26 min    Equipment Utilized During Treatment Gait belt    Activity Tolerance Patient tolerated treatment well    Behavior During Therapy WFL for tasks assessed/performed             Past Medical History:  Diagnosis Date   CKD (chronic kidney disease)    Hypercholesteremia    Hypertension    Noncompliance with medication regimen    Past Surgical History:  Procedure Laterality Date   A/V FISTULAGRAM Left 08/15/2023   Procedure: A/V Fistulagram;  Surgeon: Patricia Hensen, MD;  Location: [facility];  Service: Cardiovascular;  Laterality: Left;   AV FISTULA PLACEMENT Left 06/18/2023   Procedure: INSERTION OF LEFT ARM ARTERIOVENOUS (AV) FISTULA;  Surgeon: Patricia Speck, MD;  Location: [facility];  Service: Vascular;  Laterality: Left;   COLONOSCOPY WITH PROPOFOL  N/A 10/17/2020   Procedure: COLONOSCOPY WITH PROPOFOL ;  Surgeon: Patricia Greening, DO;  Location: [facility];  Service: Endoscopy;  Laterality: N/A;  1:00pm   IR FLUORO GUIDE CV LINE RIGHT  05/09/2023   IR REMOVAL TUN CV CATH W/O FL  01/28/2024   IR US  GUIDE VASC ACCESS RIGHT  05/10/2023   POLYPECTOMY  10/17/2020   Procedure: POLYPECTOMY;  Surgeon: Patricia Greening, DO;  Location: [facility];  Service: Endoscopy;;   Patient Active Problem List   Diagnosis Date Noted   Class 2 obesity due to excess calories with body mass index (BMI) of 39.0 to 39.9 in adult 05/10/2023   Acute on chronic combined systolic and diastolic CHF  (congestive heart failure) (HCC) 05/09/2023   Hypervolemia associated with renal insufficiency 05/07/2023   ESRD needing dialysis (HCC) 05/07/2023   History of Coumadin  therapy 03/26/2023   Atrial fibrillation, controlled (HCC) 03/04/2023   Asthma, chronic 03/04/2023   Chronic kidney disease, stage IV (severe) (HCC) 02/18/2023   Anemia of chronic renal failure 02/18/2023   Acute-on-chronic kidney injury (HCC) 02/15/2022   Hyperlipidemia 02/15/2022   Obesity (BMI 30-39.9) 02/15/2022   Colon cancer screening 09/21/2020   Elevated alkaline phosphatase level 09/21/2020   Essential hypertension 02/18/2018   Elevated brain natriuretic peptide (BNP) level 02/18/2018   CKD (chronic kidney disease) stage 3, GFR 30-59 ml/min (HCC) 02/18/2018    ONSET DATE: 03/13/24  REFERRING DIAG: 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    Date of Onset 03/13/24    Prior Treatments self care    Pain Scale 0-10    Pain Score 7    with debridement   Pain Type Acute pain  Pain Location Ankle    Pain Orientation Right;Medial    Pain Intervention(s) Emotional support    Evaluation and Treatment Procedures Explained to Patient/Family Yes    Evaluation and Treatment Procedures agreed to    Wound Properties Date First Assessed: 05/13/24 Time First Assessed: 1550 Wound Type: Other (Comment) Location: Leg Location Orientation: Right;Medial Wound Description (Comments): black necrotic Present on Admission: Yes   Dressing Type None;Gauze (Comment)    Dressing Changed Changed    Dressing Status New drainage    Dressing Change Frequency PRN    Site / Wound Assessment  Black;Dry;Painful;Yellow;Red    % Wound base Red or Granulating 10%    % Wound base Yellow/Fibrinous Exudate 90%   thick which eschar   Peri-wound Assessment --   darkened skin   Drainage Amount Moderate    Treatment Cleansed;Debridement (Selective)    Selective Debridement (non-excisional) - Location wound base    Selective Debridement (non-excisional) - Tools Used Forceps;Scalpel    Selective Debridement (non-excisional) - Tissue Removed eschar    Wound Therapy - Clinical Statement see below    Wound Therapy - Functional Problem List difficult to bath, dress    Factors Delaying/Impairing Wound Healing Infection - systemic/local;Polypharmacy;Other (comment)   on dialysis due to renal failure   Hydrotherapy Plan Debridement;Dressing change;Patient/family education;Other (comment)    Wound Therapy - Frequency 2X / week   fro 4 week s   Wound Therapy - Current Recommendations PT    Wound Plan debridement and dressing change as needed    Dressing  medihoney, 4x4, gauze and netting               PATIENT EDUCATION: Education details: keep dressing dry do not remove unless necessary  Person educated: Patient and Child(ren) Education method: Explanation Education comprehension: verbalized understanding   HOME EXERCISE PROGRAM: None    GOALS: Goals reviewed with patient? No  SHORT TERM GOALS: Target date: 05/27/24  Pt wound to be 80% granulated  Baseline: Goal status: IN PROGRESS  2.  PT pain to be decreased to no greater than a 5/10 Baseline:  Goal status: IN PROGRESS    LONG TERM GOALS: Target date: 06/11/23  Pt wound to be healed  Baseline:  Goal status: IN PROGRESS  2.  Pt pain to be no greater than a 0 in her Rt LE  Baseline:  Goal status: IN PROGRESS  3.  PT to have no difficulties washing or dressing due to wound  Baseline:  Goal status: IN PROGRESS    ASSESSMENT:  CLINICAL IMPRESSION: Patient is a 68 y.o. female who was seen today for physical  therapy  treatment for Rt LE non healing wound.  The wound is painful to superficial touch which is limiting our ability to debride the area.  Ms. Patricia Wu will continue to  benefit from skilled PT to debride the necrotic tissue to allow healing and decrease pain of her Right leg wound. .    OBJECTIVE IMPAIRMENTS: pain and decreased skin integrity .   ACTIVITY LIMITATIONS: bathing and dressing   PERSONAL FACTORS: Fitness, Time since onset of injury/illness/exacerbation, and 1 comorbidity: CKI on dialysis are also affecting patient's functional outcome.   REHAB POTENTIAL: Fair    CLINICAL DECISION MAKING: Evolving/moderate complexity  EVALUATION COMPLEXITY: Moderate  PLAN: PT FREQUENCY: 2x/week  PT DURATION: 4 weeks  PLANNED INTERVENTIONS: 97535- Self Care and 78295- Wound care (first 20 sq cm)  PLAN FOR NEXT SESSION: continue with debridement and dressing change   Adah Acron  Lenise Quince, PT CLT [phone]  05/20/2024, 3:15 PM

## 2024-05-22 ENCOUNTER — Ambulatory Visit (HOSPITAL_COMMUNITY): Admitting: Physical Therapy

## 2024-05-22 DIAGNOSIS — M79661 Pain in right lower leg: Secondary | ICD-10-CM

## 2024-05-22 DIAGNOSIS — S81801S Unspecified open wound, right lower leg, sequela: Secondary | ICD-10-CM | POA: Diagnosis not present

## 2024-05-22 NOTE — Therapy (Addendum)
 OUTPATIENT PHYSICAL THERAPY Wound Treatment   Patient Name: Patricia Wu MRN: [id number] DOB:[birth year], 68 y.o., female Today's Date: 05/22/2024   PCP: Caro Christmas REFERRING PROVIDER: Hyler, Bethene Brought, NP  END OF SESSION:  PT End of Session - 05/22/24 1611     Visit Number 3    Number of Visits 8    Date for PT Re-Evaluation 06/12/24    Authorization Type [employer]    Progress Note Due on Visit 8    PT Start Time 1455    PT Stop Time 1520    PT Time Calculation (min) 25 min    Activity Tolerance Patient tolerated treatment well    Behavior During Therapy WFL for tasks assessed/performed             Past Medical History:  Diagnosis Date   CKD (chronic kidney disease)    Hypercholesteremia    Hypertension    Noncompliance with medication regimen    Past Surgical History:  Procedure Laterality Date   A/V FISTULAGRAM Left 08/15/2023   Procedure: A/V Fistulagram;  Surgeon: Young Hensen, MD;  Location: [facility];  Service: Cardiovascular;  Laterality: Left;   AV FISTULA PLACEMENT Left 06/18/2023   Procedure: INSERTION OF LEFT ARM ARTERIOVENOUS (AV) FISTULA;  Surgeon: Mayo Speck, MD;  Location: [facility];  Service: Vascular;  Laterality: Left;   COLONOSCOPY WITH PROPOFOL  N/A 10/17/2020   Procedure: COLONOSCOPY WITH PROPOFOL ;  Surgeon: Vinetta Greening, DO;  Location: [facility];  Service: Endoscopy;  Laterality: N/A;  1:00pm   IR FLUORO GUIDE CV LINE RIGHT  05/09/2023   IR REMOVAL TUN CV CATH W/O FL  01/28/2024   IR US  GUIDE VASC ACCESS RIGHT  05/10/2023   POLYPECTOMY  10/17/2020   Procedure: POLYPECTOMY;  Surgeon: Vinetta Greening, DO;  Location: [facility];  Service: Endoscopy;;   Patient Active Problem List   Diagnosis Date Noted   Class 2 obesity due to excess calories with body mass index (BMI) of 39.0 to 39.9 in adult 05/10/2023   Acute on chronic combined systolic and diastolic CHF (congestive heart failure) (HCC) 05/09/2023    Hypervolemia associated with renal insufficiency 05/07/2023   ESRD needing dialysis (HCC) 05/07/2023   History of Coumadin  therapy 03/26/2023   Atrial fibrillation, controlled (HCC) 03/04/2023   Asthma, chronic 03/04/2023   Chronic kidney disease, stage IV (severe) (HCC) 02/18/2023   Anemia of chronic renal failure 02/18/2023   Acute-on-chronic kidney injury (HCC) 02/15/2022   Hyperlipidemia 02/15/2022   Obesity (BMI 30-39.9) 02/15/2022   Colon cancer screening 09/21/2020   Elevated alkaline phosphatase level 09/21/2020   Essential hypertension 02/18/2018   Elevated brain natriuretic peptide (BNP) level 02/18/2018   CKD (chronic kidney disease) stage 3, GFR 30-59 ml/min (HCC) 02/18/2018    ONSET DATE: 03/13/24  REFERRING DIAG: 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    Patient and Family Stated Goals less pain and wound to heal.    Date of Onset 03/13/24    Prior Treatments self care    Pain Scale 0-10    Pain Score 6    with debridement   Pain Type Acute pain    Pain Intervention(s) Emotional support    Evaluation  and Treatment Procedures Explained to Patient/Family Yes    Evaluation and Treatment Procedures agreed to    Wound Properties Date First Assessed: 05/13/24 Time First Assessed: 1550 Wound Type: Other (Comment) Location: Leg Location Orientation: Right;Medial Wound Description (Comments): black necrotic Present on Admission: Yes   Dressing Type Gauze (Comment)    Dressing Changed Changed    Dressing Status New drainage    Dressing Change Frequency PRN    Site / Wound Assessment Black;Dry;Painful;Yellow;Red    % Wound base Red or Granulating 20%    % Wound base Yellow/Fibrinous Exudate 80%    Peri-wound Assessment --   darkened  skin   Drainage Amount Minimal    Treatment Cleansed;Debridement (Selective)    Selective Debridement (non-excisional) - Location wound base    Selective Debridement (non-excisional) - Tools Used Forceps;Scalpel    Selective Debridement (non-excisional) - Tissue Removed eschar    Wound Therapy - Clinical Statement see below    Wound Therapy - Functional Problem List difficult to bath, dress    Factors Delaying/Impairing Wound Healing Infection - systemic/local;Polypharmacy;Other (comment)   on dialysis due to renal failure   Hydrotherapy Plan Debridement;Dressing change;Patient/family education;Other (comment)    Wound Therapy - Frequency 2X / week   fro 4 week s   Wound Therapy - Current Recommendations PT    Wound Plan debridement and dressing change as needed    Dressing  medihoney, 4x4, gauze and netting               PATIENT EDUCATION: Education details: keep dressing dry do not remove unless necessary  Person educated: Patient and Child(ren) Education method: Explanation Education comprehension: verbalized understanding   HOME EXERCISE PROGRAM: None    GOALS: Goals reviewed with patient? No  SHORT TERM GOALS: Target date: 05/27/24  Pt wound to be 80% granulated  Baseline: Goal status: IN PROGRESS  2.  PT pain to be decreased to no greater than a 5/10 Baseline:  Goal status: IN PROGRESS    LONG TERM GOALS: Target date: 06/11/23  Pt wound to be healed  Baseline:  Goal status: IN PROGRESS  2.  Pt pain to be no greater than a 0 in her Rt LE  Baseline:  Goal status: IN PROGRESS  3.  PT to have no difficulties washing or dressing due to wound  Baseline:  Goal status: IN PROGRESS    ASSESSMENT:  CLINICAL IMPRESSION: Patient is a 68 y.o. female who was seen today for physical therapy  treatment for Rt LE non healing wound.  The wound is painful to superficial touch which is limiting our ability to debride the area.  The pt has  [name] will continue  to  benefit from skilled PT to debride the necrotic tissue to allow healing and decrease pain of her Right leg wound. Pt does have swelling in her LE, however, due to complaint that her leg is more painful at night in bed the therapist is not donning any compression.     OBJECTIVE IMPAIRMENTS: pain and decreased skin integrity .   ACTIVITY LIMITATIONS: bathing and dressing   PERSONAL FACTORS: Fitness, Time since onset of injury/illness/exacerbation, and 1 comorbidity: CKI on dialysis are also affecting patient's functional outcome.   REHAB POTENTIAL: Fair    CLINICAL DECISION MAKING: Evolving/moderate complexity  EVALUATION COMPLEXITY: Moderate  PLAN: PT FREQUENCY: 2x/week  PT DURATION: 4 weeks  PLANNED INTERVENTIONS: 97535- Self Care and 16109- Wound care (first 20 sq cm)  PLAN FOR NEXT SESSION: Measure  and photo next visit.  continue with debridement and dressing change   Leodis Rainwater, PT CLT [phone]  05/22/2024, 4:19 PM

## 2024-05-23 DIAGNOSIS — N186 End stage renal disease: Secondary | ICD-10-CM | POA: Diagnosis not present

## 2024-05-23 DIAGNOSIS — Z992 Dependence on renal dialysis: Secondary | ICD-10-CM | POA: Diagnosis not present

## 2024-05-24 DIAGNOSIS — D631 Anemia in chronic kidney disease: Secondary | ICD-10-CM | POA: Diagnosis not present

## 2024-05-24 DIAGNOSIS — D509 Iron deficiency anemia, unspecified: Secondary | ICD-10-CM | POA: Diagnosis not present

## 2024-05-24 DIAGNOSIS — N186 End stage renal disease: Secondary | ICD-10-CM | POA: Diagnosis not present

## 2024-05-24 DIAGNOSIS — Z992 Dependence on renal dialysis: Secondary | ICD-10-CM | POA: Diagnosis not present

## 2024-05-26 ENCOUNTER — Ambulatory Visit (HOSPITAL_COMMUNITY): Attending: Nurse Practitioner | Admitting: Physical Therapy

## 2024-05-26 ENCOUNTER — Encounter

## 2024-05-26 DIAGNOSIS — S81801S Unspecified open wound, right lower leg, sequela: Secondary | ICD-10-CM | POA: Diagnosis present

## 2024-05-26 DIAGNOSIS — M79661 Pain in right lower leg: Secondary | ICD-10-CM | POA: Diagnosis present

## 2024-05-26 NOTE — Therapy (Signed)
 OUTPATIENT PHYSICAL THERAPY Wound  EVALUATION   Patient Name: Patricia Wu MRN: [id number] DOB:04/06/1956, 68 y.o., female Today's Date: 05/26/2024   PCP: Caro Christmas REFERRING PROVIDER: Hyler, Bethene Brought, NP  END OF SESSION:  PT End of Session - 05/26/24 1516     Visit Number 4    Number of Visits 8    Date for PT Re-Evaluation 06/12/24    Authorization Type [employer]    Progress Note Due on Visit 8    PT Start Time 1449    PT Stop Time 1513    PT Time Calculation (min) 24 min    Activity Tolerance Patient limited by pain    Behavior During Therapy [facility] for tasks assessed/performed             Past Medical History:  Diagnosis Date   CKD (chronic kidney disease)    Hypercholesteremia    Hypertension    Noncompliance with medication regimen    Past Surgical History:  Procedure Laterality Date   A/V FISTULAGRAM Left 08/15/2023   Procedure: A/V Fistulagram;  Surgeon: Young Hensen, MD;  Location: [facility];  Service: Cardiovascular;  Laterality: Left;   AV FISTULA PLACEMENT Left 06/18/2023   Procedure: INSERTION OF LEFT ARM ARTERIOVENOUS (AV) FISTULA;  Surgeon: Mayo Speck, MD;  Location: [facility];  Service: Vascular;  Laterality: Left;   COLONOSCOPY WITH PROPOFOL  N/A 10/17/2020   Procedure: COLONOSCOPY WITH PROPOFOL ;  Surgeon: Vinetta Greening, DO;  Location[facility];  Service: Endoscopy;  Laterality: N/A;  1:00pm   IR FLUORO GUIDE CV LINE RIGHT  05/09/2023   IR REMOVAL TUN CV CATH W/O FL  01/28/2024   IR US  GUIDE VASC ACCESS RIGHT  05/10/2023   POLYPECTOMY  10/17/2020   Procedure: POLYPECTOMY;  Surgeon: Vinetta Greening, DO;  Location: [facility];  Service: Endoscopy;;   Patient Active Problem List   Diagnosis Date Noted   Class 2 obesity due to excess calories with body mass index (BMI) of 39.0 to 39.9 in adult 05/10/2023   Acute on chronic combined systolic and diastolic CHF (congestive heart failure) (HCC) 05/09/2023    Hypervolemia associated with renal insufficiency 05/07/2023   ESRD needing dialysis (HCC) 05/07/2023   History of Coumadin  therapy 03/26/2023   Atrial fibrillation, controlled (HCC) 03/04/2023   Asthma, chronic 03/04/2023   Chronic kidney disease, stage IV (severe) (HCC) 02/18/2023   Anemia of chronic renal failure 02/18/2023   Acute-on-chronic kidney injury (HCC) 02/15/2022   Hyperlipidemia 02/15/2022   Obesity (BMI 30-39.9) 02/15/2022   Colon cancer screening 09/21/2020   Elevated alkaline phosphatase level 09/21/2020   Essential hypertension 02/18/2018   Elevated brain natriuretic peptide (BNP) level 02/18/2018   CKD (chronic kidney disease) stage 3, GFR 30-59 ml/min (HCC) 02/18/2018    ONSET DATE: 03/13/24  REFERRING DIAG: 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    Patient and Family Stated Goals Wound to heal    Date of Onset 03/13/24    Prior Treatments self care    Pain Scale 0-10    Pain Score 5     Pain Type Acute pain    Pain Intervention(s) Emotional support    Evaluation and Treatment Procedures Explained to Patient/Family Yes  Evaluation and Treatment Procedures agreed to    Wound Properties Date First Assessed: 05/13/24 Time First Assessed: 1550 Wound Type: Other (Comment) Location: Leg Location Orientation: Right;Medial Wound Description (Comments): black necrotic Present on Admission: Yes   Wound Image Images linked: 1    Dressing Type Gauze (Comment)    Dressing Changed Changed    Dressing Status New drainage    Dressing Change Frequency PRN    Site / Wound Assessment Black;Dry;Painful;Yellow;Red    % Wound base Red or Granulating 20%    % Wound base Yellow/Fibrinous Exudate 80%    Peri-wound Assessment --   darkened skin    Wound Length (cm) 2.5 cm    Wound Width (cm) 3.3 cm    Wound Surface Area (cm^2) 8.25 cm^2    Drainage Amount Minimal    Treatment Debridement (Selective);Cleansed    Selective Debridement (non-excisional) - Location wound base    Selective Debridement (non-excisional) - Tools Used Forceps;Scalpel    Selective Debridement (non-excisional) - Tissue Removed eschar    Wound Therapy - Clinical Statement see below    Wound Therapy - Functional Problem List difficult to bath, dress    Factors Delaying/Impairing Wound Healing Infection - systemic/local;Polypharmacy;Other (comment)   on dialysis due to renal failure   Hydrotherapy Plan Debridement;Dressing change;Patient/family education;Other (comment)    Wound Therapy - Frequency 2X / week   fro 4 week s   Wound Therapy - Current Recommendations PT    Wound Plan debridement and dressing change as needed    Dressing  medihoney, 4x4, gauze and netting               PATIENT EDUCATION: Education details: keep dressing dry do not remove unless necessary  Person educated: Patient and Child(ren) Education method: Explanation Education comprehension: verbalized understanding   HOME EXERCISE PROGRAM: None    GOALS: Goals reviewed with patient? No  SHORT TERM GOALS: Target date: 05/27/24  Pt wound to be 80% granulated  Baseline: Goal status: IN PROGRESS  2.  PT pain to be decreased to no greater than a 5/10 Baseline:  Goal status: IN PROGRESS    LONG TERM GOALS: Target date: 06/11/23  Pt wound to be healed  Baseline:  Goal status: IN PROGRESS  2.  Pt pain to be no greater than a 0 in her Rt LE  Baseline:  Goal status: IN PROGRESS  3.  PT to have no difficulties washing or dressing due to wound  Baseline:  Goal status: IN PROGRESS    ASSESSMENT:  CLINICAL IMPRESSION: Patient is a 68 y.o. female who was seen today for physical therapy  treatment for Rt LE non healing wound.  The wound is painful to debridement  which limits treatment.  Therapist is able to debride a small amount of necrotic tissue each visit.    The pt has  Ms. Bejarano will continue to  benefit from skilled PT to debride the necrotic tissue to allow healing and decrease pain of her Right leg wound. Pt does have swelling in her LE, however, due to complaint that her leg is more painful at night in bed the therapist is not donning any compression.     OBJECTIVE IMPAIRMENTS: pain and decreased skin integrity .   ACTIVITY LIMITATIONS: bathing and dressing   PERSONAL FACTORS: Fitness, Time since onset of injury/illness/exacerbation, and 1 comorbidity: CKI on dialysis are also affecting patient's functional outcome.   REHAB POTENTIAL: Fair    CLINICAL DECISION MAKING: Evolving/moderate complexity  EVALUATION  COMPLEXITY: Moderate  PLAN: PT FREQUENCY: 2x/week  PT DURATION: 4 weeks  PLANNED INTERVENTIONS: 97535- Self Care and 29562- Wound care (first 20 sq cm)  PLAN FOR NEXT SESSION:  continue with debridement and dressing change   Leodis Rainwater, PT CLT [phone]  05/26/2024, 3:20 PM

## 2024-05-27 ENCOUNTER — Ambulatory Visit: Attending: Internal Medicine | Admitting: *Deleted

## 2024-05-27 DIAGNOSIS — Z5181 Encounter for therapeutic drug level monitoring: Secondary | ICD-10-CM | POA: Diagnosis not present

## 2024-05-27 DIAGNOSIS — I4891 Unspecified atrial fibrillation: Secondary | ICD-10-CM | POA: Diagnosis not present

## 2024-05-27 LAB — POCT INR: INR: 3.2 — AB (ref 2.0–3.0)

## 2024-05-27 NOTE — Patient Instructions (Signed)
 Take warfarin 1 tablet tonight then resume 2 tablets daily except 3 tablets on Mondays, Wednesdays and Fridays Recheck 4 wks

## 2024-05-28 ENCOUNTER — Ambulatory Visit (HOSPITAL_COMMUNITY): Admitting: Physical Therapy

## 2024-05-28 DIAGNOSIS — M79661 Pain in right lower leg: Secondary | ICD-10-CM

## 2024-05-28 DIAGNOSIS — S81801S Unspecified open wound, right lower leg, sequela: Secondary | ICD-10-CM | POA: Diagnosis not present

## 2024-05-28 NOTE — Therapy (Addendum)
 OUTPATIENT PHYSICAL THERAPY Wound Treatment Patient Name: Patricia Wu MRN: [id number] DOB:04-May-1956, 68 y.o., female Today's Date: 05/28/2024   PCP: Caro Christmas REFERRING PROVIDER: Hyler, Bethene Brought, NP  END OF SESSION:  PT End of Session - 05/28/24 1522     Visit Number 5    Number of Visits 8    Date for PT Re-Evaluation 06/12/24    Authorization Type [employer]    Progress Note Due on Visit 8    PT Start Time 1451    PT Stop Time 1518    PT Time Calculation (min) 27 min    Activity Tolerance Patient limited by pain    Behavior During Therapy [facility] for tasks assessed/performed             Past Medical History:  Diagnosis Date   CKD (chronic kidney disease)    Hypercholesteremia    Hypertension    Noncompliance with medication regimen    Past Surgical History:  Procedure Laterality Date   A/V FISTULAGRAM Left 08/15/2023   Procedure: A/V Fistulagram;  Surgeon: Young Hensen, MD;  [facility]: [facility];  Service: Cardiovascular;  Laterality: Left;   AV FISTULA PLACEMENT Left 06/18/2023   Procedure: INSERTION OF LEFT ARM ARTERIOVENOUS (AV) FISTULA;  Surgeon: Mayo Speck, MD;  Location: [facility];  Service: Vascular;  Laterality: Left;   COLONOSCOPY WITH PROPOFOL  N/A 10/17/2020   Procedure: COLONOSCOPY WITH PROPOFOL ;  Surgeon: Vinetta Greening, DO;  Location: [facility];  Service: Endoscopy;  Laterality: N/A;  1:00pm   IR FLUORO GUIDE CV LINE RIGHT  05/09/2023   IR REMOVAL TUN CV CATH W/O FL  01/28/2024   IR US  GUIDE VASC ACCESS RIGHT  05/10/2023   POLYPECTOMY  10/17/2020   Procedure: POLYPECTOMY;  Surgeon: Vinetta Greening, DO;  [facility]: [facility];  Service: Endoscopy;;   Patient Active Problem List   Diagnosis Date Noted   Class 2 obesity due to excess calories with body mass index (BMI) of 39.0 to 39.9 in adult 05/10/2023   Acute on chronic combined systolic and diastolic CHF (congestive heart failure) (HCC) 05/09/2023   Hypervolemia  associated with renal insufficiency 05/07/2023   ESRD needing dialysis (HCC) 05/07/2023   History of Coumadin  therapy 03/26/2023   Atrial fibrillation, controlled (HCC) 03/04/2023   Asthma, chronic 03/04/2023   Chronic kidney disease, stage IV (severe) (HCC) 02/18/2023   Anemia of chronic renal failure 02/18/2023   Acute-on-chronic kidney injury (HCC) 02/15/2022   Hyperlipidemia 02/15/2022   Obesity (BMI 30-39.9) 02/15/2022   Colon cancer screening 09/21/2020   Elevated alkaline phosphatase level 09/21/2020   Essential hypertension 02/18/2018   Elevated brain natriuretic peptide (BNP) level 02/18/2018   CKD (chronic kidney disease) stage 3, GFR 30-59 ml/min (HCC) 02/18/2018    ONSET DATE: 03/13/24  REFERRING DIAG: 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    Patient and Family Stated Goals Wound to heal    Date of Onset 03/13/24    Prior Treatments self care    Pain Scale 0-10    Pain Score 5     Pain Type Acute pain    Pain Intervention(s) Emotional support    Evaluation and Treatment Procedures Explained to Patient/Family Yes  Evaluation and Treatment Procedures agreed to    Wound Properties Date First Assessed: 05/13/24 Time First Assessed: 1550 Wound Type: Other (Comment) Location: Leg Location Orientation: Right;Medial Wound Description (Comments): black necrotic Present on Admission: Yes   Wound Image Images linked: 1    Dressing Type Gauze (Comment)    Dressing Changed Changed    Dressing Status New drainage    Dressing Change Frequency PRN    Site / Wound Assessment Black;Dry;Painful;Yellow;Red    % Wound base Red or Granulating 20%    % Wound base Yellow/Fibrinous Exudate 80%    Peri-wound Assessment --   darkened skin   Wound  Length (cm) 2.5 cm    Wound Width (cm) 3.3 cm    Wound Surface Area (cm^2) 8.25 cm^2    Drainage Amount Minimal    Treatment Debridement (Selective);Cleansed    Selective Debridement (non-excisional) - Location wound base    Selective Debridement (non-excisional) - Tools Used Forceps;Scalpel    Selective Debridement (non-excisional) - Tissue Removed eschar    Wound Therapy - Clinical Statement see below    Wound Therapy - Functional Problem List difficult to bath, dress    Factors Delaying/Impairing Wound Healing Infection - systemic/local;Polypharmacy;Other (comment)   on dialysis due to renal failure   Hydrotherapy Plan Debridement;Dressing change;Patient/family education;Other (comment)    Wound Therapy - Frequency 2X / week   fro 4 week s   Wound Therapy - Current Recommendations PT    Wound Plan debridement and dressing change as needed    Dressing  medihoney, 4x4, gauze and netting               PATIENT EDUCATION: Education details: keep dressing dry do not remove unless necessary  Person educated: Patient and Child(ren) Education method: Explanation Education comprehension: verbalized understanding   HOME EXERCISE PROGRAM: None    GOALS: Goals reviewed with patient? No  SHORT TERM GOALS: Target date: 05/27/24  Pt wound to be 80% granulated  Baseline: Goal status: IN PROGRESS  2.  PT pain to be decreased to no greater than a 5/10 Baseline:  Goal status: IN PROGRESS    LONG TERM GOALS: Target date: 06/11/23  Pt wound to be healed  Baseline:  Goal status: IN PROGRESS  2.  Pt pain to be no greater than a 0 in her Rt LE  Baseline:  Goal status: IN PROGRESS  3.  PT to have no difficulties washing or dressing due to wound  Baseline:  Goal status: IN PROGRESS    ASSESSMENT:  CLINICAL IMPRESSION: Patient is a 68 y.o. female who was seen today for physical therapy  treatment for Rt LE non healing wound.  The wound continues to be painful to debridement  which limits treatment.  Therapist is able to debride a small amount of necrotic tissue each visit.      Ms. Nevitt will continue to  benefit from skilled PT to debride the necrotic tissue to allow healing and decrease pain of the wound . OBJECTIVE IMPAIRMENTS: pain and decreased skin integrity .   ACTIVITY LIMITATIONS: bathing and dressing   PERSONAL FACTORS: Fitness, Time since onset of injury/illness/exacerbation, and 1 comorbidity: CKI on dialysis are also affecting patient's functional outcome.   REHAB POTENTIAL: Fair    CLINICAL DECISION MAKING: Evolving/moderate complexity  EVALUATION COMPLEXITY: Moderate  PLAN: PT FREQUENCY: 2x/week  PT DURATION: 4 weeks  PLANNED INTERVENTIONS: 97535- Self Care and 40981- Wound care (first 20 sq cm)  PLAN FOR NEXT SESSION:  continue  with debridement and dressing change   Leodis Rainwater, PT CLT [phone]  05/28/2024, 3:26 PM

## 2024-06-01 DIAGNOSIS — Z992 Dependence on renal dialysis: Secondary | ICD-10-CM | POA: Diagnosis not present

## 2024-06-01 DIAGNOSIS — N186 End stage renal disease: Secondary | ICD-10-CM | POA: Diagnosis not present

## 2024-06-02 ENCOUNTER — Ambulatory Visit (HOSPITAL_COMMUNITY): Admitting: Physical Therapy

## 2024-06-02 DIAGNOSIS — M79661 Pain in right lower leg: Secondary | ICD-10-CM

## 2024-06-02 DIAGNOSIS — S81801S Unspecified open wound, right lower leg, sequela: Secondary | ICD-10-CM | POA: Diagnosis not present

## 2024-06-02 NOTE — Progress Notes (Signed)
   06/02/24 0001  Subjective Assessment  Subjective Pt states that she continues to have decreased pain.  Patient and Family Stated Goals Pt states pain is only with debridement now.  Date of Onset 03/13/24  Prior Treatments self care  Pain Assessment  Pain Scale 0-10  Pain Score 4  Pain Type Acute pain  Pain Intervention(s) Emotional support  Evaluation and Treatment  Evaluation and Treatment Procedures Explained to Patient/Family Yes  Evaluation and Treatment Procedures agreed to  Wound / Incision (Open or Dehisced) 05/13/24 Other (Comment) Leg Right;Medial black necrotic  Date First Assessed/Time First Assessed: 05/13/24 1550   Wound Type: Other (Comment)  Location: Leg  Location Orientation: Right;Medial  Wound Description (Comments): black necrotic  Present on Admission: Yes  Dressing Type Gauze (Comment)  Dressing Changed Changed  Dressing Status New drainage  Dressing Change Frequency PRN  Site / Wound Assessment Dry;Yellow;Red  % Wound base Red or Granulating 50%  % Wound base Yellow/Fibrinous Exudate 50%  Peri-wound Assessment  (darkened skin)  Wound Length (cm) 2.5 cm  Wound Width (cm) 2.8 cm  Wound Surface Area (cm^2) 7 cm^2  Drainage Amount Minimal  Drainage Description Purulent  Treatment Cleansed;Debridement (Selective)  Selective Debridement (non-excisional)  Selective Debridement (non-excisional) - Location wound base  Selective Debridement (non-excisional) - Tools Used Forceps;Scalpel;Scissors  Selective Debridement (non-excisional) - Tissue Removed eschar  Wound Therapy - Assess/Plan/Recommendations  Wound Therapy - Clinical Statement see below  Wound Therapy - Functional Problem List difficult to bath, dress  Factors Delaying/Impairing Wound Healing Infection - systemic/local;Polypharmacy;Other (comment) (on dialysis due to renal failure)  Hydrotherapy Plan Debridement;Dressing change;Patient/family education;Other (comment)  Wound Therapy - Frequency 2X /  week (fro 4 week s)  Wound Therapy - Current Recommendations PT  Wound Plan debridement and dressing change as needed  Wound Therapy  Dressing  medihoney, 4x4, gauze and netting

## 2024-06-02 NOTE — Therapy (Signed)
 OUTPATIENT PHYSICAL THERAPY Wound Treatment Patient Name: Patricia Wu MRN: [id number] DOB:21-May-1956, 68 y.o., female Today's Date: 06/02/2024   PCP: Patricia Wu REFERRING PROVIDER: Hyler, Bethene Brought, NP  END OF SESSION:  PT End of Session - 06/02/24 1514     Visit Number 6    Number of Visits 8    Date for PT Re-Evaluation 06/12/24    Authorization Type [employer]    Progress Note Due on Visit 8    PT Start Time 1448    PT Stop Time 1514    PT Time Calculation (min) 26 min    Activity Tolerance Patient limited by pain    Behavior During Therapy [facility] for tasks assessed/performed             Past Medical History:  Diagnosis Date   CKD (chronic kidney disease)    Hypercholesteremia    Hypertension    Noncompliance with medication regimen    Past Surgical History:  Procedure Laterality Date   A/V FISTULAGRAM Left 08/15/2023   Procedure: A/V Fistulagram;  Surgeon: Patricia Hensen, MD;  [facility]: [facility];  Service: Cardiovascular;  Laterality: Left;   AV FISTULA PLACEMENT Left 06/18/2023   Procedure: INSERTION OF LEFT ARM ARTERIOVENOUS (AV) FISTULA;  Surgeon: Patricia Speck, MD;  Location: [facility];  Service: Vascular;  Laterality: Left;   COLONOSCOPY WITH PROPOFOL  N/A 10/17/2020   Procedure: COLONOSCOPY WITH PROPOFOL ;  Surgeon: Patricia Greening, DO;  [facility];  Service: Endoscopy;  Laterality: N/A;  1:00pm   IR FLUORO GUIDE CV LINE RIGHT  05/09/2023   IR REMOVAL TUN CV CATH W/O FL  01/28/2024   IR US  GUIDE VASC ACCESS RIGHT  05/10/2023   POLYPECTOMY  10/17/2020   Procedure: POLYPECTOMY;  Surgeon: Patricia Greening, DO;  [facility];  Service: Endoscopy;;   Patient Active Problem List   Diagnosis Date Noted   Class 2 obesity due to excess calories with body mass index (BMI) of 39.0 to 39.9 in adult 05/10/2023   Acute on chronic combined systolic and diastolic CHF (congestive heart failure) (HCC) 05/09/2023   Hypervolemia  associated with renal insufficiency 05/07/2023   ESRD needing dialysis (HCC) 05/07/2023   History of Coumadin  therapy 03/26/2023   Atrial fibrillation, controlled (HCC) 03/04/2023   Asthma, chronic 03/04/2023   Chronic kidney disease, stage IV (severe) (HCC) 02/18/2023   Anemia of chronic renal failure 02/18/2023   Acute-on-chronic kidney injury (HCC) 02/15/2022   Hyperlipidemia 02/15/2022   Obesity (BMI 30-39.9) 02/15/2022   Colon cancer screening 09/21/2020   Elevated alkaline phosphatase level 09/21/2020   Essential hypertension 02/18/2018   Elevated brain natriuretic peptide (BNP) level 02/18/2018   CKD (chronic kidney disease) stage 3, GFR 30-59 ml/min (HCC) 02/18/2018    ONSET DATE: 03/13/24  REFERRING DIAG: 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  Patient and Family Stated Goals Pt states pain is only with debridement now.  Date of Onset 03/13/24  Prior Treatments self care  Pain Assessment  Pain Scale 0-10  Pain Score 4  Pain Type Acute pain  Pain Intervention(s) Emotional support  Evaluation and Treatment  Evaluation and Treatment Procedures Explained to Patient/Family Yes  Evaluation and Treatment Procedures agreed to  Wound /  Incision (Open or Dehisced) 05/13/24 Other (Comment) Leg Right;Medial black necrotic  Date First Assessed/Time First Assessed: 05/13/24 1550   Wound Type: Other (Comment)  Location: Leg  Location Orientation: Right;Medial  Wound Description (Comments): black necrotic  Present on Admission: Yes  Dressing Type Gauze (Comment)  Dressing Changed Changed  Dressing Status New drainage  Dressing Change Frequency PRN  Site / Wound Assessment Dry;Yellow;Red  % Wound base Red or Granulating 50%  % Wound  base Yellow/Fibrinous Exudate 50%  Peri-wound Assessment  (darkened skin)  Wound Length (cm) 2.5 cm  Wound Width (cm) 2.8 cm  Wound Surface Area (cm^2) 7 cm^2  Drainage Amount Minimal  Drainage Description Purulent  Treatment Cleansed;Debridement (Selective)  Selective Debridement (non-excisional)  Selective Debridement (non-excisional) - Location wound base  Selective Debridement (non-excisional) - Tools Used Forceps;Scalpel;Scissors  Selective Debridement (non-excisional) - Tissue Removed eschar  Wound Therapy - Assess/Plan/Recommendations  Wound Therapy - Clinical Statement see below  Wound Therapy - Functional Problem List difficult to bath, dress  Factors Delaying/Impairing Wound Healing Infection - systemic/local;Polypharmacy;Other (comment) (on dialysis due to renal failure)  Hydrotherapy Plan Debridement;Dressing change;Patient/family education;Other (comment)  Wound Therapy - Frequency 2X / week (fro 4 week s)  Wound Therapy - Current Recommendations PT  Wound Plan debridement and dressing change as needed  Wound Therapy  Dressing  medihoney, 4x4, gauze and netting          PATIENT EDUCATION: Education details: keep dressing dry do not remove unless necessary  Person educated: Patient and Child(ren) Education method: Explanation Education comprehension: verbalized understanding   HOME EXERCISE PROGRAM: None    GOALS: Goals reviewed with patient? No  SHORT TERM GOALS: Target date: 05/27/24  Pt wound to be 80% granulated  Baseline: Goal status: IN PROGRESS  2.  PT pain to be decreased to no greater than a 5/10 Baseline:  Goal status: MET    LONG TERM GOALS: Target date: 06/11/23  Pt wound to be healed  Baseline:  Goal status: IN PROGRESS  2.  Pt pain to be no greater than a 0 in her Rt LE  Baseline:  Goal status: IN PROGRESS  3.  PT to have no difficulties washing or dressing due to wound  Baseline:  Goal status: IN  PROGRESS    ASSESSMENT:  CLINICAL IMPRESSION: Patient's wound continues to improve in granulation.  PT tolerance to debridement has improved as pain has decreased.  [name] will continue to  benefit from skilled PT to debride the necrotic tissue to allow healing and decrease pain of the wound . OBJECTIVE IMPAIRMENTS: pain and decreased skin integrity .   ACTIVITY LIMITATIONS: bathing and dressing   PERSONAL FACTORS: Fitness, Time since onset of injury/illness/exacerbation, and 1 comorbidity: CKI on dialysis are also affecting patient's functional outcome.   REHAB POTENTIAL: Fair    CLINICAL DECISION MAKING: Evolving/moderate complexity  EVALUATION COMPLEXITY: Moderate  PLAN: PT FREQUENCY: 2x/week  PT DURATION: 4 weeks  PLANNED INTERVENTIONS: 97535- Self Care and 04540- Wound care (first 20 sq cm)  PLAN FOR NEXT SESSION:  continue with debridement and dressing change   Leodis Rainwater, PT CLT [phone]  06/02/2024, 3:25 PM

## 2024-06-04 ENCOUNTER — Ambulatory Visit: Payer: No Typology Code available for payment source | Admitting: Internal Medicine

## 2024-06-04 ENCOUNTER — Other Ambulatory Visit: Payer: Self-pay | Admitting: Family Medicine

## 2024-06-04 ENCOUNTER — Ambulatory Visit (HOSPITAL_COMMUNITY): Admitting: Physical Therapy

## 2024-06-04 DIAGNOSIS — M79661 Pain in right lower leg: Secondary | ICD-10-CM

## 2024-06-04 DIAGNOSIS — E785 Hyperlipidemia, unspecified: Secondary | ICD-10-CM

## 2024-06-04 DIAGNOSIS — S81801S Unspecified open wound, right lower leg, sequela: Secondary | ICD-10-CM | POA: Diagnosis not present

## 2024-06-04 NOTE — Therapy (Signed)
 OUTPATIENT PHYSICAL THERAPY Wound Treatment Patient Name: Patricia Wu MRN: [id number] DOB:[birth year], 68 y.o., female Today's Date: 06/04/2024   PCP: Patricia Wu REFERRING PROVIDER: Hyler, Bethene Brought, NP  END OF SESSION:  PT End of Session - 06/04/24 1516     Visit Number 7    Number of Visits 8    Date for PT Re-Evaluation 06/12/24    Authorization Type [employer]    Progress Note Due on Visit 8    PT Start Time 1448    PT Stop Time 1513    PT Time Calculation (min) 25 min    Activity Tolerance Patient limited by pain    Behavior During Therapy [facility] for tasks assessed/performed          Past Medical History:  Diagnosis Date   CKD (chronic kidney disease)    Hypercholesteremia    Hypertension    Noncompliance with medication regimen    Past Surgical History:  Procedure Laterality Date   A/V FISTULAGRAM Left 08/15/2023   Procedure: A/V Fistulagram;  Surgeon: Patricia Hensen, MD;  Location: [facility] [place] [facility];  Service: Cardiovascular;  Laterality: Left;   AV FISTULA PLACEMENT Left 06/18/2023   Procedure: INSERTION OF LEFT ARM ARTERIOVENOUS (AV) FISTULA;  Surgeon: Patricia Speck, MD;  Location: [facility];  Service: Vascular;  Laterality: Left;   COLONOSCOPY WITH PROPOFOL  N/A 10/17/2020   Procedure: COLONOSCOPY WITH PROPOFOL ;  Surgeon: Patricia Greening, DO;  [facility];  Service: Endoscopy;  Laterality: N/A;  1:00pm   IR FLUORO GUIDE CV LINE RIGHT  05/09/2023   IR REMOVAL TUN CV CATH W/O FL  01/28/2024   IR US  GUIDE VASC ACCESS RIGHT  05/10/2023   POLYPECTOMY  10/17/2020   Procedure: POLYPECTOMY;  Surgeon: Patricia Greening, DO;  [facility];  Service: Endoscopy;;   Patient Active Problem List   Diagnosis Date Noted   Class 2 obesity due to excess calories with body mass index (BMI) of 39.0 to 39.9 in adult 05/10/2023   Acute on chronic combined systolic and diastolic CHF (congestive heart failure) (HCC) 05/09/2023   Hypervolemia  associated with renal insufficiency 05/07/2023   ESRD needing dialysis (HCC) 05/07/2023   History of Coumadin  therapy 03/26/2023   Atrial fibrillation, controlled (HCC) 03/04/2023   Asthma, chronic 03/04/2023   Chronic kidney disease, stage IV (severe) (HCC) 02/18/2023   Anemia of chronic renal failure 02/18/2023   Acute-on-chronic kidney injury (HCC) 02/15/2022   Hyperlipidemia 02/15/2022   Obesity (BMI 30-39.9) 02/15/2022   Colon cancer screening 09/21/2020   Elevated alkaline phosphatase level 09/21/2020   Essential hypertension 02/18/2018   Elevated brain natriuretic peptide (BNP) level 02/18/2018   CKD (chronic kidney disease) stage 3, GFR 30-59 ml/min (HCC) 02/18/2018    ONSET DATE: 03/13/24  REFERRING DIAG: 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  Patient and Family Stated Goals Pt states pain is only with debridement now.  Date of Onset 03/13/24  Prior Treatments self care  Pain Assessment  Pain Scale 0-10  Pain Score 4  Pain Intervention(s) Emotional support  Evaluation and Treatment  Evaluation and Treatment Procedures Explained to Patient/Family Yes  Evaluation and Treatment Procedures agreed to  Wound / Incision (Open or Dehisced) 05/13/24 Other (Comment) Leg Right;Medial black  necrotic  Date First Assessed/Time First Assessed: 05/13/24 1550   Wound Type: Other (Comment)  Location: Leg  Location Orientation: Right;Medial  Wound Description (Comments): black necrotic  Present on Admission: Yes  Wound Image   Dressing Type Gauze (Comment)  Dressing Changed Changed  Dressing Status New drainage  Dressing Change Frequency PRN  Site / Wound Assessment Yellow;Red  % Wound base Red or Granulating 60%  % Wound base  Yellow/Fibrinous Exudate 40%  Peri-wound Assessment  (darkened skin)  Wound Length (cm) 2.5 cm  Wound Width (cm) 3 cm  Wound Surface Area (cm^2) 7.5 cm^2  Drainage Amount Minimal  Drainage Description Purulent  Treatment Cleansed;Debridement (Selective)  Selective Debridement (non-excisional)  Selective Debridement (non-excisional) - Location wound base  Selective Debridement (non-excisional) - Tools Used Forceps;Scalpel;Scissors  Selective Debridement (non-excisional) - Tissue Removed eschar  Wound Therapy - Assess/Plan/Recommendations  Wound Therapy - Clinical Statement see below  Wound Therapy - Functional Problem List difficult to bath, dress  Factors Delaying/Impairing Wound Healing Infection - systemic/local;Polypharmacy;Other (comment) (on dialysis due to renal failure)  Hydrotherapy Plan Debridement;Dressing change;Patient/family education;Other (comment)  Wound Therapy - Frequency 2X / week (fro 4 week s)  Wound Therapy - Current Recommendations PT  Wound Plan debridement and dressing change as needed  Wound Therapy  Dressing  medihoney, 4x4, gauze and netting     PATIENT EDUCATION: Education details: keep dressing dry do not remove unless necessary  Person educated: Patient and Child(ren) Education method: Explanation Education comprehension: verbalized understanding   HOME EXERCISE PROGRAM: None    GOALS: Goals reviewed with patient? No  SHORT TERM GOALS: Target date: 05/27/24  Pt wound to be 80% granulated  Baseline: Goal status: IN PROGRESS  2.  PT pain to be decreased to no greater than a 5/10 Baseline:  Goal status: MET    LONG TERM GOALS: Target date: 06/11/23  Pt wound to be healed  Baseline:  Goal status: IN PROGRESS  2.  Pt pain to be no greater than a 0 in her Rt LE  Baseline:  Goal status: IN PROGRESS  3.  PT to have no difficulties washing or dressing due to wound  Baseline:  Goal status: IN PROGRESS    ASSESSMENT:  CLINICAL  IMPRESSION: Patient's wound continues to improve in granulation.  PT tolerance to debridement has improved as pain has decreased.  [name] will continue to  benefit from skilled PT to debride the necrotic tissue to allow healing and decrease pain of the wound . OBJECTIVE IMPAIRMENTS: pain and decreased skin integrity .   ACTIVITY LIMITATIONS: bathing and dressing   PERSONAL FACTORS: Fitness, Time since onset of injury/illness/exacerbation, and 1 comorbidity: CKI on dialysis are also affecting patient's functional outcome.   REHAB POTENTIAL: Fair    CLINICAL DECISION MAKING: Evolving/moderate complexity  EVALUATION COMPLEXITY: Moderate  PLAN: PT FREQUENCY: 2x/week  PT DURATION: 4 weeks  PLANNED INTERVENTIONS: 97535- Self Care and 16109- Wound care (first 20 sq cm)  PLAN FOR NEXT SESSION:  continue with debridement and dressing change   Leodis Rainwater, PT CLT [phone]  06/04/2024, 3:21 PM

## 2024-06-09 ENCOUNTER — Ambulatory Visit (HOSPITAL_COMMUNITY): Admitting: Physical Therapy

## 2024-06-09 DIAGNOSIS — M79661 Pain in right lower leg: Secondary | ICD-10-CM

## 2024-06-09 DIAGNOSIS — S81801S Unspecified open wound, right lower leg, sequela: Secondary | ICD-10-CM | POA: Diagnosis not present

## 2024-06-09 NOTE — Therapy (Addendum)
 OUTPATIENT PHYSICAL THERAPY Wound Treatment Progress Note Reporting Period 05/13/24 to 06/09/24  See note below for Objective Data and Assessment of Progress/Goals.     Patient Name: Patricia Wu MRN: [id number] DOB:08-Aug-1956, 68 y.o., female Today's Date: 06/09/2024   PCP: Caro Christmas REFERRING PROVIDER: Hyler, Bethene Brought, NP  END OF SESSION:  PT End of Session - 06/09/24 1558     Visit Number 8    Number of Visits 16    Date for PT Re-Evaluation 07/08/24    Authorization Type [employer]    Progress Note Due on Visit 16    PT Start Time 1445    PT Stop Time 1510    PT Time Calculation (min) 25 min    Activity Tolerance Patient limited by pain    Behavior During Therapy [facility] for tasks assessed/performed           Past Medical History:  Diagnosis Date   CKD (chronic kidney disease)    Hypercholesteremia    Hypertension    Noncompliance with medication regimen    Past Surgical History:  Procedure Laterality Date   A/V FISTULAGRAM Left 08/15/2023   Procedure: A/V Fistulagram;  Surgeon: Young Hensen, MD;  Location: [facility];  Service: Cardiovascular;  Laterality: Left;   AV FISTULA PLACEMENT Left 06/18/2023   Procedure: INSERTION OF LEFT ARM ARTERIOVENOUS (AV) FISTULA;  Surgeon: Mayo Speck, MD;  Location: [facility] OR[facility];  Service: Vascular;  Laterality: Left;   COLONOSCOPY WITH PROPOFOL  N/A 10/17/2020   Procedure: COLONOSCOPY WITH PROPOFOL ;  Surgeon: Vinetta Greening, DO;  Location: [facility];  Service: Endoscopy;  Laterality: N/A;  1:00pm   IR FLUORO GUIDE CV LINE RIGHT  05/09/2023   IR REMOVAL TUN CV CATH W/O FL  01/28/2024   IR US  GUIDE VASC ACCESS RIGHT  05/10/2023   POLYPECTOMY  10/17/2020   Procedure: POLYPECTOMY;  Surgeon: Vinetta Greening, DO;  Location: [facility];  Service: Endoscopy;;   Patient Active Problem List   Diagnosis Date Noted   Class 2 obesity due to excess calories with body mass index (BMI) of 39.0 to 39.9 in adult  05/10/2023   Acute on chronic combined systolic and diastolic CHF (congestive heart failure) (HCC) 05/09/2023   Hypervolemia associated with renal insufficiency 05/07/2023   ESRD needing dialysis (HCC) 05/07/2023   History of Coumadin  therapy 03/26/2023   Atrial fibrillation, controlled (HCC) 03/04/2023   Asthma, chronic 03/04/2023   Chronic kidney disease, stage IV (severe) (HCC) 02/18/2023   Anemia of chronic renal failure 02/18/2023   Acute-on-chronic kidney injury (HCC) 02/15/2022   Hyperlipidemia 02/15/2022   Obesity (BMI 30-39.9) 02/15/2022   Colon cancer screening 09/21/2020   Elevated alkaline phosphatase level 09/21/2020   Essential hypertension 02/18/2018   Elevated brain natriuretic peptide (BNP) level 02/18/2018   CKD (chronic kidney disease) stage 3, GFR 30-59 ml/min (HCC) 02/18/2018    ONSET DATE: 03/13/24  REFERRING DIAG: 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    Date of Onset 03/13/24    Prior Treatments self care    Pain Scale 0-10    Pain Score 4  was 9   Pain Type  Acute pain    Pain Onset With Activity    Evaluation and Treatment Procedures Explained to Patient/Family Yes    Evaluation and Treatment Procedures agreed to    Wound Properties Date First Assessed: 05/13/24 Time First Assessed: 1550 Location: Leg Location Orientation: Right;Medial Wound Description (Comments): black necrotic DO NOT USE: Wound Type: Other (Comment) DO NOT USE:  Present on Admission: Yes   Wound Image Images linked: 1    Dressing Type Gauze (Comment)    Dressing Changed Changed    Dressing Status New drainage    Dressing Change Frequency PRN    Site / Wound Assessment  Yellow;Red    % Wound base Red or Granulating 75% was 0   % Wound base Yellow/Fibrinous Exudate 30%  was 100 % black eschar with unknown depth   Peri-wound Assessment Intact   darkened skin   Wound Length (cm) 2.3 cm was 2,8   Wound Width (cm) 2.8 cm was 3.5   Wound Surface Area (cm^2) 6.44 cm^2    Drainage Amount Minimal    Drainage Description Purulent    Treatment Cleansed;Debridement (Selective)    Selective Debridement (non-excisional) - Location wound base    Selective Debridement (non-excisional) - Tools Used Scalpel    Selective Debridement (non-excisional) - Tissue Removed eschar    Wound Therapy - Clinical Statement see below    Wound Therapy - Functional Problem List difficult to bath, dress    Factors Delaying/Impairing Wound Healing Infection - systemic/local;Polypharmacy;Other (comment)   on dialysis due to renal failure   Hydrotherapy Plan Debridement;Dressing change;Patient/family education;Other (comment)    Wound Therapy - Frequency 2X / week   fro 4 week s   Wound Therapy - Current Recommendations PT    Wound Plan debridement and dressing change as needed    Dressing  vaseline to perimeter, medihoney on 2X2, 4x4, gauze and netting             PATIENT EDUCATION: Education details: keep dressing dry do not remove unless necessary  Person educated: Patient and Child(ren) Education method: Explanation Education comprehension: verbalized understanding   HOME EXERCISE PROGRAM: None    GOALS: Goals reviewed with patient? No  SHORT TERM GOALS: Target date: 05/27/24  Pt wound to be 80% granulated  Baseline: Goal status: IN PROGRESS  2.  PT pain to be decreased to no greater than a 5/10 Baseline:  Goal status: MET    LONG TERM GOALS: Target date: 06/11/23  Pt wound to be healed  Baseline:  Goal status: IN PROGRESS  2.  Pt pain to be no greater than a 0 in her Rt LE  Baseline:  Goal status: IN PROGRESS  3.  PT to have no difficulties washing or  dressing due to wound  Baseline:  Goal status: IN PROGRESS    ASSESSMENT:  CLINICAL IMPRESSION: Pt seems to have improving tolerance to debridement.  Increased granulation overall today following removal of more devitalized tissue.  Continued with medihoney and gauze.  Will need continued wound care for skilled debridement and appropriate dressing to promote healing environment.    OBJECTIVE IMPAIRMENTS: pain and decreased skin integrity .    ACTIVITY LIMITATIONS: bathing and dressing   PERSONAL FACTORS: Fitness, Time since onset of injury/illness/exacerbation, and 1 comorbidity: CKI on dialysis are also affecting patient's functional outcome.   REHAB POTENTIAL: Fair    CLINICAL DECISION MAKING: Evolving/moderate complexity  EVALUATION COMPLEXITY: Moderate  PLAN: PT FREQUENCY: 2x/week  PT DURATION: 4 weeks (06/09/24:  continue for  four more weeks)  PLANNED INTERVENTIONS: 97535- Self Care and 16109- Wound care (first 20 sq cm)  PLAN FOR NEXT SESSION:  continue with debridement and dressing change, 2X week for 4 more weeks. Weekly measurements and photographs.    Lorenso Romance, PTA/CLT [facility] Ph: [phone] 06/09/2024, 4:03 PM Leodis Rainwater, PT CLT [phone]

## 2024-06-10 NOTE — Addendum Note (Signed)
 Addended by: Adrienne Alberts on: 06/10/2024 09:11 AM   Modules accepted: Orders

## 2024-06-11 ENCOUNTER — Ambulatory Visit (HOSPITAL_COMMUNITY): Admitting: Physical Therapy

## 2024-06-16 ENCOUNTER — Ambulatory Visit (HOSPITAL_COMMUNITY): Admitting: Physical Therapy

## 2024-06-16 DIAGNOSIS — M79661 Pain in right lower leg: Secondary | ICD-10-CM

## 2024-06-16 DIAGNOSIS — S81801S Unspecified open wound, right lower leg, sequela: Secondary | ICD-10-CM

## 2024-06-16 NOTE — Therapy (Signed)
 OUTPATIENT PHYSICAL THERAPY Wound Treatment    Patient Name: Patricia Wu MRN: [id number] DOB:1956/11/24, 68 y.o., female Today's Date: 06/16/2024   PCP: Reyes Pines REFERRING PROVIDER: Hyler, Vernell DEL, NP  END OF SESSION:  PT End of Session - 06/16/24 1612     Visit Number 9    Number of Visits 16    Date for PT Re-Evaluation 07/08/24    Authorization Type [employer]    Progress Note Due on Visit 16    PT Start Time 1458    PT Stop Time 1530    PT Time Calculation (min) 32 min    Equipment Utilized During Treatment Gait belt    Activity Tolerance Patient limited by pain    Behavior During Therapy WFL for tasks assessed/performed           Past Medical History:  Diagnosis Date   CKD (chronic kidney disease)    Hypercholesteremia    Hypertension    Noncompliance with medication regimen    Past Surgical History:  Procedure Laterality Date   A/V FISTULAGRAM Left 08/15/2023   Procedure: A/V Fistulagram;  Surgeon: Gretta Lonni PARAS, MD;  Location: [facility] INVASIVE CV LAB;  Service: Cardiovascular;  Laterality: Left;   AV FISTULA PLACEMENT Left 06/18/2023   Procedure: INSERTION OF LEFT ARM ARTERIOVENOUS (AV) FISTULA;  Surgeon: Oris Krystal FALCON, MD;  Location: [facility];  Service: Vascular;  Laterality: Left;   COLONOSCOPY WITH PROPOFOL  N/A 10/17/2020   Procedure: COLONOSCOPY WITH PROPOFOL ;  Surgeon: Cindie Carlin POUR, DO;  Location[facility];  Service: Endoscopy;  Laterality: N/A;  1:00pm   IR FLUORO GUIDE CV LINE RIGHT  05/09/2023   IR REMOVAL TUN CV CATH W/O FL  01/28/2024   IR US  GUIDE VASC ACCESS RIGHT  05/10/2023   POLYPECTOMY  10/17/2020   Procedure: POLYPECTOMY;  Surgeon: Cindie Carlin POUR, DO;  [facility];  Service: Endoscopy;;   Patient Active Problem List   Diagnosis Date Noted   Class 2 obesity due to excess calories with body mass index (BMI) of 39.0 to 39.9 in adult 05/10/2023   Acute on chronic combined systolic and diastolic CHF  (congestive heart failure) (HCC) 05/09/2023   Hypervolemia associated with renal insufficiency 05/07/2023   ESRD needing dialysis (HCC) 05/07/2023   History of Coumadin  therapy 03/26/2023   Atrial fibrillation, controlled (HCC) 03/04/2023   Asthma, chronic 03/04/2023   Chronic kidney disease, stage IV (severe) (HCC) 02/18/2023   Anemia of chronic renal failure 02/18/2023   Acute-on-chronic kidney injury (HCC) 02/15/2022   Hyperlipidemia 02/15/2022   Obesity (BMI 30-39.9) 02/15/2022   Colon cancer screening 09/21/2020   Elevated alkaline phosphatase level 09/21/2020   Essential hypertension 02/18/2018   Elevated brain natriuretic peptide (BNP) level 02/18/2018   CKD (chronic kidney disease) stage 3, GFR 30-59 ml/min (HCC) 02/18/2018    ONSET DATE: 03/13/24  REFERRING DIAG: 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  Date of Onset 03/13/24  Prior Treatments self care  Pain Assessment  Pain Scale 0-10  Pain Score 7 (with debridement)  Pain Type Acute pain  Pain Onset With Activity  Evaluation and Treatment  Evaluation and Treatment Procedures Explained to Patient/Family Yes  Evaluation and Treatment Procedures agreed to  Wound / Incision (Open or Dehisced) 05/13/24 Other (Comment) Leg Right;Medial black necrotic  Date First Assessed/Time First Assessed: 05/13/24 1550   Wound Type: Other (Comment)  Location: Leg  Location Orientation: Right;Medial  Wound Description (Comments): black necrotic  Present on Admission: Yes  Wound Image   Dressing Type Gauze (Comment)  Dressing Changed Changed  Dressing Status New drainage  Dressing Change Frequency PRN  Site / Wound  Assessment Yellow;Red  % Wound base Red or Granulating 70%  % Wound base Yellow/Fibrinous Exudate 30%  Peri-wound Assessment Intact (darkened skin)  Wound Length (cm)  (pt now has two seperate wounds the superior wound is 1.8 x 3; the inferior wound is .8 x .3)  Drainage Amount Minimal  Drainage Description Purulent  Treatment Cleansed;Debridement (Selective)  Selective Debridement (non-excisional)  Selective Debridement (non-excisional) - Location wound base  Selective Debridement (non-excisional) - Tools Used Forceps;Scissors  Selective Debridement (non-excisional) - Tissue Removed eschar  Wound Therapy - Assess/Plan/Recommendations  Wound Therapy - Clinical Statement see below  Wound Therapy - Functional Problem List difficult to bath, dress  Factors Delaying/Impairing Wound Healing Infection - systemic/local;Polypharmacy;Other (comment) (on dialysis due to renal failure)  Hydrotherapy Plan Debridement;Dressing change;Patient/family education;Other (comment)  Wound Therapy - Frequency 2X / week (fro 4 week s)  Wound Therapy - Current Recommendations PT  Wound Plan debridement and dressing change as needed  Wound Therapy  Dressing  vaseline to perimeter, medihoney on 2X2, 4x4, gauze and netting      PATIENT EDUCATION: Education details: keep dressing dry do not remove unless necessary  Person educated: Patient and Child(ren) Education method: Explanation Education comprehension: verbalized understanding   HOME EXERCISE PROGRAM: None    GOALS: Goals reviewed with patient? No  SHORT TERM GOALS: Target date: 05/27/24  Pt wound to be 80% granulated  Baseline: Goal status: IN PROGRESS  2.  PT pain to be decreased to no greater than a 5/10 Baseline:  Goal status: MET    LONG TERM GOALS: Target date: 06/11/23  Pt wound to be healed  Baseline:  Goal status: IN PROGRESS  2.  Pt pain to be no greater than a 0 in her Rt LE  Baseline:  Goal status: IN PROGRESS  3.   PT to have no difficulties washing or dressing due to wound  Baseline:  Goal status: IN PROGRESS    ASSESSMENT:  CLINICAL IMPRESSION: PT did not come to last treatment due to illness.  Pt has not changed dressing which has a foul odor.  Therapist explained that if pt is unable to come to treatment she should change the dressing on her own.  Pt had poor tolerance to debridement today.     Continued with medihoney and gauze.  Will need continued wound care for skilled debridement and appropriate dressing to promote healing environment.    OBJECTIVE IMPAIRMENTS: pain and decreased skin integrity .    ACTIVITY LIMITATIONS: bathing and dressing   PERSONAL FACTORS: Fitness, Time since onset of injury/illness/exacerbation, and 1 comorbidity: CKI on dialysis are also affecting patient's functional outcome.   REHAB POTENTIAL: Fair    CLINICAL DECISION MAKING: Evolving/moderate complexity  EVALUATION COMPLEXITY: Moderate  PLAN: PT FREQUENCY: 2x/week  PT DURATION: 4 weeks (06/09/24:  continue for four more weeks)  PLANNED INTERVENTIONS: 97535- Self Care and 02402- Wound care (first 20 sq cm)  PLAN FOR NEXT SESSION:  continue with debridement and dressing change Weekly measurements and photographs.   [facility]  Rehabilitation [facility] Ph: [phone] 06/16/2024, 4:23 PM Montie Metro, PT CLT [phone]

## 2024-06-18 ENCOUNTER — Ambulatory Visit (HOSPITAL_COMMUNITY): Admitting: Physical Therapy

## 2024-06-18 DIAGNOSIS — S81801S Unspecified open wound, right lower leg, sequela: Secondary | ICD-10-CM

## 2024-06-18 DIAGNOSIS — M79661 Pain in right lower leg: Secondary | ICD-10-CM

## 2024-06-18 NOTE — Therapy (Signed)
 OUTPATIENT PHYSICAL THERAPY Wound Treatment    Patient Name: Patricia Wu MRN: [id number] DOB:04-11-1956, 68 y.o., female Today's Date: 06/18/2024   PCP: Reyes Pines REFERRING PROVIDER: Hyler, Vernell DEL, NP  END OF SESSION:  PT End of Session - 06/18/24 1516     Visit Number 10 progress note completed at visit 8   Number of Visits 16    Date for PT Re-Evaluation 07/08/24    Authorization Type [employer]    Progress Note Due on Visit 16    PT Start Time 1448    PT Stop Time 1512    PT Time Calculation (min) 24 min    Equipment Utilized During Treatment Gait belt    Activity Tolerance Patient limited by pain    Behavior During Therapy WFL for tasks assessed/performed           Past Medical History:  Diagnosis Date   CKD (chronic kidney disease)    Hypercholesteremia    Hypertension    Noncompliance with medication regimen    Past Surgical History:  Procedure Laterality Date   A/V FISTULAGRAM Left 08/15/2023   Procedure: A/V Fistulagram;  Surgeon: Gretta Lonni PARAS, MD;  Location: [facility] INVASIVE CV LAB;  Service: Cardiovascular;  Laterality: Left;   AV FISTULA PLACEMENT Left 06/18/2023   Procedure: INSERTION OF LEFT ARM ARTERIOVENOUS (AV) FISTULA;  Surgeon: Oris Krystal FALCON, MD;  Location: [facility];  Service: Vascular;  Laterality: Left;   COLONOSCOPY WITH PROPOFOL  N/A 10/17/2020   Procedure: COLONOSCOPY WITH PROPOFOL ;  Surgeon: Cindie Carlin POUR, DO;  [facility];  Service: Endoscopy;  Laterality: N/A;  1:00pm   IR FLUORO GUIDE CV LINE RIGHT  05/09/2023   IR REMOVAL TUN CV CATH W/O FL  01/28/2024   IR US  GUIDE VASC ACCESS RIGHT  05/10/2023   POLYPECTOMY  10/17/2020   Procedure: POLYPECTOMY;  Surgeon: Cindie Carlin POUR, DO;  [facility];  Service: Endoscopy;;   Patient Active Problem List   Diagnosis Date Noted   Class 2 obesity due to excess calories with body mass index (BMI) of 39.0 to 39.9 in adult 05/10/2023   Acute on chronic combined  systolic and diastolic CHF (congestive heart failure) (HCC) 05/09/2023   Hypervolemia associated with renal insufficiency 05/07/2023   ESRD needing dialysis (HCC) 05/07/2023   History of Coumadin  therapy 03/26/2023   Atrial fibrillation, controlled (HCC) 03/04/2023   Asthma, chronic 03/04/2023   Chronic kidney disease, stage IV (severe) (HCC) 02/18/2023   Anemia of chronic renal failure 02/18/2023   Acute-on-chronic kidney injury (HCC) 02/15/2022   Hyperlipidemia 02/15/2022   Obesity (BMI 30-39.9) 02/15/2022   Colon cancer screening 09/21/2020   Elevated alkaline phosphatase level 09/21/2020   Essential hypertension 02/18/2018   Elevated brain natriuretic peptide (BNP) level 02/18/2018   CKD (chronic kidney disease) stage 3, GFR 30-59 ml/min (HCC) 02/18/2018    ONSET DATE: 03/13/24  REFERRING DIAG: 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  Patient and Family Stated Goals wound to heal, less pain  Date of Onset 03/13/24  Prior Treatments self care  Pain Assessment  Pain Scale 0-10  Pain Score 7  Pain Type Acute pain  Pain Onset Other (Comment) (during debridement)  Pain Intervention(s) Emotional support  Evaluation and  Treatment  Evaluation and Treatment Procedures Explained to Patient/Family Yes  Evaluation and Treatment Procedures agreed to  Wound / Incision (Open or Dehisced) 05/13/24 Other (Comment) Leg Right;Medial black necrotic  Date First Assessed/Time First Assessed: 05/13/24 1550   Wound Type: Other (Comment)  Location: Leg  Location Orientation: Right;Medial  Wound Description (Comments): black necrotic  Present on Admission: Yes  Dressing Type Gauze (Comment)  Dressing Changed Changed  Dressing Status New  drainage  Dressing Change Frequency PRN  Site / Wound Assessment Red;Yellow  % Wound base Red or Granulating 70%  % Wound base Yellow/Fibrinous Exudate 30%  Peri-wound Assessment Edema (fiberous)  Wound Length (cm)  (two seperate 1)1.6x 2.8 ; 2) .5 x .3:   eval was 2.8 in length.)  Wound Width (cm)  (two seperate 1)1.6x 2.8 ; 2) .5 x .3  eval was 3.5 in length)  Wound Depth (cm)  (pockets that are .2 cm deep eval was black eschar with unknown depth.)  Drainage Amount Minimal  Drainage Description Purulent  Selective Debridement (non-excisional)  Selective Debridement (non-excisional) - Location wound base  Selective Debridement (non-excisional) - Tools Used Forceps;Scalpel;Scissors  Selective Debridement (non-excisional) - Tissue Removed slough  Wound Therapy - Assess/Plan/Recommendations  Wound Therapy - Clinical Statement see below  Wound Therapy - Functional Problem List difficult to bath, dress  Factors Delaying/Impairing Wound Healing Infection - systemic/local;Polypharmacy;Other (comment) (on dialysis due to renal failure)  Hydrotherapy Plan Debridement;Dressing change;Patient/family education;Other (comment)  Wound Therapy - Frequency 2X / week (fro 4 week s)  Wound Therapy - Current Recommendations PT  Wound Plan debridement and dressing change as needed  Wound Therapy  Dressing  vaseline to perimeter, medihoney on 2X2, 4x4, gauze and netting  Manual Therapy to edges of wound.      PATIENT EDUCATION: Education details: keep dressing dry do not remove unless necessary  Person educated: Patient and Child(ren) Education method: Explanation Education comprehension: verbalized understanding   HOME EXERCISE PROGRAM: None    GOALS: Goals reviewed with patient? No  SHORT TERM GOALS: Target date: 05/27/24  Pt wound to be 80% granulated  Baseline: Goal status: IN PROGRESS  2.  PT pain to be decreased to no greater than a 5/10 Baseline:  Goal status:  MET    LONG TERM GOALS: Target date: 06/11/23  Pt wound to be healed  Baseline:  Goal status: IN PROGRESS  2.  Pt pain to be no greater than a 0 in her Rt LE  Baseline:  Goal status: IN PROGRESS  3.  PT to have no difficulties washing or dressing due to wound  Baseline:  Goal status: IN PROGRESS    ASSESSMENT:  CLINICAL IMPRESSION: PT wound continues to improve in granulation and approximation.    Pt continues to have  poor tolerance to debridement.     Pt continues to need  wound care for skilled debridement and appropriate dressing to promote healing environment.    OBJECTIVE IMPAIRMENTS: pain and decreased skin integrity .    ACTIVITY LIMITATIONS: bathing and dressing   PERSONAL FACTORS: Fitness, Time since onset of injury/illness/exacerbation, and 1 comorbidity: CKI on dialysis are also affecting patient's functional outcome.   REHAB POTENTIAL: Fair    CLINICAL DECISION MAKING: Evolving/moderate complexity  EVALUATION COMPLEXITY: Moderate  PLAN: PT FREQUENCY: 2x/week  PT DURATION: 4 weeks (06/09/24:  continue for four more weeks)  PLANNED INTERVENTIONS: 97535- Self Care and 02402- Wound care (first 20 sq cm)  PLAN FOR NEXT SESSION:  continue with debridement and  dressing change Weekly measurements and photographs.   [facility] Ph: [phone] 06/18/2024, 3:26 PM Montie Metro, PT CLT [phone]

## 2024-06-22 DIAGNOSIS — N186 End stage renal disease: Secondary | ICD-10-CM | POA: Diagnosis not present

## 2024-06-22 DIAGNOSIS — Z992 Dependence on renal dialysis: Secondary | ICD-10-CM | POA: Diagnosis not present

## 2024-06-23 DIAGNOSIS — D631 Anemia in chronic kidney disease: Secondary | ICD-10-CM | POA: Diagnosis not present

## 2024-06-23 DIAGNOSIS — D509 Iron deficiency anemia, unspecified: Secondary | ICD-10-CM | POA: Diagnosis not present

## 2024-06-23 DIAGNOSIS — N186 End stage renal disease: Secondary | ICD-10-CM | POA: Diagnosis not present

## 2024-06-23 DIAGNOSIS — Z992 Dependence on renal dialysis: Secondary | ICD-10-CM | POA: Diagnosis not present

## 2024-06-25 ENCOUNTER — Ambulatory Visit (HOSPITAL_COMMUNITY): Attending: Nurse Practitioner | Admitting: Physical Therapy

## 2024-06-25 DIAGNOSIS — M79661 Pain in right lower leg: Secondary | ICD-10-CM | POA: Insufficient documentation

## 2024-06-25 DIAGNOSIS — S81801S Unspecified open wound, right lower leg, sequela: Secondary | ICD-10-CM | POA: Insufficient documentation

## 2024-06-25 NOTE — Therapy (Signed)
 OUTPATIENT PHYSICAL THERAPY Wound Treatment    Patient Name: Patricia Wu MRN: [id number] DOB:[birth year], 68 y.o., female Today's Date: 06/25/2024   PCP: Re[facility] Pines REFERRING PROVIDER: Hyler, Vernell DEL, NP  END OF SESSION: END OF SESSION:   PT End of Session - 06/25/24 1617     Visit Number 11    Number of Visits 16    Date for PT Re-Evaluation 07/08/24    Authorization Type [employer]    Progress Note Due on Visit 16    PT Start Time 1549    PT Stop Time 1614    PT Time Calculation (min) 25 min    Equipment Utilized During Treatment Gait belt    Activity Tolerance Patient limited by pain    Behavior During Therapy WFL for tasks assessed/performed             Past Medical History:  Diagnosis Date   CKD (chronic kidney disease)    Hypercholesteremia    Hypertension    Noncompliance with medication regimen    Past Surgical History:  Procedure Laterality Date   A/V FISTULAGRAM Left 08/15/2023   Procedure: A/V Fistulagram;  Surgeon: Gretta Lonni PARAS, MD;  Location: [facility] INVASIVE CV LAB;  Service: Cardiovascular;  Laterality: Left;   AV FISTULA PLACEMENT Left 06/18/2023   Procedure: INSERTION OF LEFT ARM ARTERIOVENOUS (AV) FISTULA;  Surgeon: Oris Krystal FALCON, MD;  Location: [facility] ORS;  Service: Vascular;  Laterality: Left;   COLONOSCOPY WITH PROPOFOL  N/A 10/17/2020   Procedure: COLONOSCOPY WITH PROPOFOL ;  Surgeon: Cindie Carlin POUR, DO;  Location: [facility] END[facility];  Service: Endoscopy;  Laterality: N/A;  1:00pm   IR FLUORO GUIDE CV LINE RIGHT  05/09/2023   IR REMOVAL TUN CV CATH W/O FL  01/28/2024   IR US  GUIDE VASC ACCESS RIGHT  05/10/2023   POLYPECTOMY  10/17/2020   Procedure: POLYPECTOMY;  Surgeon: Cindie Carlin POUR, DO;  Location: [facility];  Service: Endoscopy;;   Patient Active Problem List   Diagnosis Date Noted   Class 2 obesity due to excess calories with body mass index (BMI) of 39.0 to 39.9 in adult 05/10/2023   Acute on chronic combined systolic and  diastolic CHF (congestive heart failure) (HCC) 05/09/2023   Hypervolemia associated with renal insufficiency 05/07/2023   ESRD needing dialysis (HCC) 05/07/2023   History of Coumadin  therapy 03/26/2023   Atrial fibrillation, controlled (HCC) 03/04/2023   Asthma, chronic 03/04/2023   Chronic kidney disease, stage IV (severe) (HCC) 02/18/2023   Anemia of chronic renal failure 02/18/2023   Acute-on-chronic kidney injury (HCC) 02/15/2022   Hyperlipidemia 02/15/2022   Obesity (BMI 30-39.9) 02/15/2022   Colon cancer screening 09/21/2020   Elevated alkaline phosphatase level 09/21/2020   Essential hypertension 02/18/2018   Elevated brain natriuretic peptide (BNP) level 02/18/2018   CKD (chronic kidney disease) stage 3, GFR 30-59 ml/min (HCC) 02/18/2018    ONSET DATE: 03/13/24  REFERRING DIAG: 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  Patient and Family Stated Goals wound to heal, less pain  Date of Onset 03/13/24  Prior Treatments self care  Pain Assessment  Pain Scale 0-10  Pain Score 5  Pain Type Acute pain  Pain Onset  (with debridement)  Pain Intervention(s) Emotional support  Evaluation and  Treatment  Evaluation and Treatment Procedures Explained to Patient/Family Yes  Evaluation and Treatment Procedures agreed to  Wound / Incision (Open or Dehisced) 05/13/24 Other (Comment) Leg Right;Medial black necrotic  Date First Assessed/Time First Assessed: 05/13/24 1550   Wound Type: Other (Comment)  Location: Leg  Location Orientation: Right;Medial  Wound Description (Comments): black necrotic  Present on Admission: Yes  Wound Image   Dressing Type Gauze (Comment)  Dressing Changed Changed  Dressing Status New drainage   Dressing Change Frequency PRN  Site / Wound Assessment Red;Yellow  % Wound base Red or Granulating 80%  % Wound base Yellow/Fibrinous Exudate 20%  Peri-wound Assessment Edema;Other (Comment) (skin is black and perimeter around the wound is fibrous)  Wound Length (cm) 2 cm  Wound Width (cm) 1.5 cm  Wound Depth (cm)  (multiple pockets that are .2-.3 cm deep.)  Wound Surface Area (cm^2) 3 cm^2  Drainage Amount Minimal  Drainage Description Purulent  Treatment Cleansed;Debridement (Selective)  Selective Debridement (non-excisional)  Selective Debridement (non-excisional) - Location wound base  Selective Debridement (non-excisional) - Tools Used Forceps;Scalpel;Scissors  Selective Debridement (non-excisional) - Tissue Removed slough  Wound Therapy - Assess/Plan/Recommendations  Wound Therapy - Clinical Statement see below  Wound Therapy - Functional Problem List difficult to bath, dress  Factors Delaying/Impairing Wound Healing Infection - systemic/local;Polypharmacy;Other (comment) (on dialysis due to renal failure)  Hydrotherapy Plan Debridement;Dressing change;Patient/family education;Other (comment)  Wound Therapy - Frequency 2X / week (fro 4 week s)  Wound Therapy - Current Recommendations PT  Wound Plan debridement and dressing change as needed  Wound Therapy  Dressing  vaseline to perimeter, medihoney on 2X2, 4x4, gauze and netting  Manual Therapy to edges of wound.   PATIENT EDUCATION: Education details: keep dressing dry do not remove unless necessary  Person educated: Patient and Child(ren) Education method: Explanation Education comprehension: verbalized understanding   HOME EXERCISE PROGRAM: None    GOALS: Goals reviewed with patient? No  SHORT TERM GOALS: Target date: 05/27/24  Pt wound to be 80% granulated  Baseline: Goal status: IN PROGRESS  2.  PT pain to be decreased to no greater than a 5/10 Baseline:  Goal status: MET    LONG TERM GOALS: Target  date: 06/11/23  Pt wound to be healed  Baseline:  Goal status: IN PROGRESS  2.  Pt pain to be no greater than a 0 in her Rt LE  Baseline:  Goal status: IN PROGRESS  3.  PT to have no difficulties washing or dressing due to wound  Baseline:  Goal status: IN PROGRESS    ASSESSMENT:  CLINICAL IMPRESSION: PT wound continues to improve in granulation and approximation.    Pt continues to have  poor tolerance to debridement.     Pt continues to need  wound care for skilled debridement and appropriate dressing to promote healing environment.    OBJECTIVE IMPAIRMENTS: pain and decreased skin integrity .    ACTIVITY LIMITATIONS: bathing and dressing   PERSONAL FACTORS: Fitness, Time since onset of injury/illness/exacerbation, and 1 comorbidity: CKI on dialysis are also affecting patient's functional outcome.   REHAB POTENTIAL: Fair    CLINICAL DECISION MAKING: Evolving/moderate complexity  EVALUATION COMPLEXITY: Moderate  PLAN: PT FREQUENCY: 2x/week  PT DURATION: 4 weeks (06/09/24:  continue for four more weeks)  PLANNED INTERVENTIONS: 97535- Self Care and 02402- Wound care (first 20 sq cm)  PLAN FOR NEXT SESSION:  continue with debridement and dressing change Weekly measurements and photographs.   [facility] Ph: [phone] 06/25/2024, 4:27 PM Montie Metro, PT CLT [phone]

## 2024-06-29 ENCOUNTER — Ambulatory Visit

## 2024-06-30 ENCOUNTER — Ambulatory Visit: Attending: Internal Medicine | Admitting: *Deleted

## 2024-06-30 DIAGNOSIS — I4891 Unspecified atrial fibrillation: Secondary | ICD-10-CM | POA: Diagnosis not present

## 2024-06-30 DIAGNOSIS — Z5181 Encounter for therapeutic drug level monitoring: Secondary | ICD-10-CM

## 2024-06-30 LAB — POCT INR: INR: 5.4 — AB (ref 2.0–3.0)

## 2024-06-30 NOTE — Patient Instructions (Signed)
 Hold warfarin tonight and tomorrow night then resume 2 tablets daily except 3 tablets on Mondays, Wednesdays and Fridays Recheck 1 wk

## 2024-07-01 ENCOUNTER — Ambulatory Visit (HOSPITAL_COMMUNITY): Admitting: Physical Therapy

## 2024-07-01 ENCOUNTER — Telehealth: Payer: Self-pay

## 2024-07-01 ENCOUNTER — Telehealth: Payer: Self-pay | Admitting: *Deleted

## 2024-07-01 DIAGNOSIS — M79661 Pain in right lower leg: Secondary | ICD-10-CM

## 2024-07-01 DIAGNOSIS — S81801S Unspecified open wound, right lower leg, sequela: Secondary | ICD-10-CM | POA: Diagnosis not present

## 2024-07-01 NOTE — Telephone Encounter (Signed)
 Spoke with patient and husband and told them to be at [facility] coumadin  clinic at 8:30 in the morning to have INR checked before going to the hospital for PV procedure.  They both verbalized understanding.

## 2024-07-01 NOTE — Telephone Encounter (Signed)
 Pt scheduled for A/V shunt intervention tomorrow in the [place] office. INR was 5.4 yesterday. Dr Serene wants her INR rechecked before procedure. Can you add her on your schedule. She thinks her procedure is at 9:30 but not sure cause she is at dialysis right now. Can you confirm that? Don't know how to look that up. I told her to be there at 9am for INR check. Thanks    Received message above from Olam Bathe, Charity fundraiser. I called PV lab and spoke with Deward. Confirmed A/V Shunt intervention is scheduled tomorrow at 9:30am - she is instructed to arrive at 9:00am. I scheduled coumadin  clinic for 8:30am to check INR pre-procedure.

## 2024-07-01 NOTE — Therapy (Signed)
 OUTPATIENT PHYSICAL THERAPY Wound Treatment    Patient Name: Patricia Wu MRN: [id number] DOB:09-Feb-1956, 68 y.o., female Today's Date: 07/01/2024   PCP: [facility] REFERRING PROVIDER: Hyler, Vernell DEL, NP  END OF SESSION: END OF SESSION:   PT End of Session - 07/01/24 1526     Visit Number 12    Number of Visits 16    Date for PT Re-Evaluation 07/08/24    Authorization Type [employer]    Progress Note Due on Visit 16    PT Start Time 1455    PT Stop Time 1520    PT Time Calculation (min) 25 min    Equipment Utilized During Treatment Gait belt    Activity Tolerance Patient limited by pain    Behavior During Therapy WFL for tasks assessed/performed             Past Medical History:  Diagnosis Date   CKD (chronic kidney disease)    Hypercholesteremia    Hypertension    Noncompliance with medication regimen    Past Surgical History:  Procedure Laterality Date   A/V FISTULAGRAM Left 08/15/2023   Procedure: A/V Fistulagram;  Surgeon: Gretta Lonni PARAS, MD;  Location: [facility] INVASIVE CV LAB;  Service: Cardiovascular;  Laterality: Left;   AV FISTULA PLACEMENT Left 06/18/2023   Procedure: INSERTION OF LEFT ARM ARTERIOVENOUS (AV) FISTULA;  Surgeon: Oris Krystal FALCON, MD;  Location: [facility];  Service: Vascular;  Laterality: Left;   COLONOSCOPY WITH PROPOFOL  N/A 10/17/2020   Procedure: COLONOSCOPY WITH PROPOFOL ;  Surgeon: Cindie Carlin POUR, DO;  [facility];  Service: Endoscopy;  Laterality: N/A;  1:00pm   IR FLUORO GUIDE CV LINE RIGHT  05/09/2023   IR REMOVAL TUN CV CATH W/O FL  01/28/2024   IR US  GUIDE VASC ACCESS RIGHT  05/10/2023   POLYPECTOMY  10/17/2020   Procedure: POLYPECTOMY;  Surgeon: Cindie Carlin POUR, DO;  [facility];  Service: Endoscopy;;   Patient Active Problem List   Diagnosis Date Noted   Class 2 obesity due to excess calories with body mass index (BMI) of 39.0 to 39.9 in adult 05/10/2023   Acute on chronic combined systolic and  diastolic CHF (congestive heart failure) (HCC) 05/09/2023   Hypervolemia associated with renal insufficiency 05/07/2023   ESRD needing dialysis (HCC) 05/07/2023   History of Coumadin  therapy 03/26/2023   Atrial fibrillation, controlled (HCC) 03/04/2023   Asthma, chronic 03/04/2023   Chronic kidney disease, stage IV (severe) (HCC) 02/18/2023   Anemia of chronic renal failure 02/18/2023   Acute-on-chronic kidney injury (HCC) 02/15/2022   Hyperlipidemia 02/15/2022   Obesity (BMI 30-39.9) 02/15/2022   Colon cancer screening 09/21/2020   Elevated alkaline phosphatase level 09/21/2020   Essential hypertension 02/18/2018   Elevated brain natriuretic peptide (BNP) level 02/18/2018   CKD (chronic kidney disease) stage 3, GFR 30-59 ml/min (HCC) 02/18/2018    ONSET DATE: 03/13/24  REFERRING DIAG: 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    Patient and Family Stated Goals wound to heal, less pain    Date of Onset 03/13/24    Prior Treatments self care    Pain Scale 0-10    Pain Score 5     Pain Type Acute  pain    Evaluation and Treatment Procedures Explained to Patient/Family Yes    Evaluation and Treatment Procedures agreed to    Wound Properties Date First Assessed: 05/13/24 Time First Assessed: 1550 Location: Leg Location Orientation: Right;Medial Wound Description (Comments): black necrotic DO NOT USE: Wound Type: Other (Comment) DO NOT USE:  Present on Admission: Yes   Wound Image Images linked: 1    Dressing Type Gauze (Comment)    Dressing Changed Changed    Dressing Status New drainage    Dressing Change Frequency PRN    Site / Wound Assessment Red;Yellow    % Wound base Red or Granulating 90%    % Wound base  Yellow/Fibrinous Exudate 10%    Peri-wound Assessment Edema;Other (Comment)   skin is black and perimeter around the wound is fibrous   Wound Length (cm) 0.8 cm    Wound Width (cm) 1.5 cm    Wound Depth (cm) 0.3 cm    Wound Volume (cm^3) 0.36 cm^3    Wound Surface Area (cm^2) 1.2 cm^2    Drainage Amount Minimal    Drainage Description Purulent;No odor    Treatment Cleansed;Debridement (Selective)    Selective Debridement (non-excisional) - Location wound base    Selective Debridement (non-excisional) - Tools Used Forceps;Scalpel;Scissors    Selective Debridement (non-excisional) - Tissue Removed slough    Wound Therapy - Clinical Statement see below    Wound Therapy - Functional Problem List difficult to bath, dress    Factors Delaying/Impairing Wound Healing Infection - systemic/local;Polypharmacy;Other (comment)   on dialysis due to renal failure   Hydrotherapy Plan Debridement;Dressing change;Patient/family education;Other (comment)    Wound Therapy - Frequency 2X / week   fro 4 week s   Wound Therapy - Current Recommendations PT    Wound Plan debridement and dressing change as needed    Dressing  vaseline to perimeter, medihoney on 2X2, 4x4, gauze and netting            PATIENT EDUCATION: Education details: keep dressing dry do not remove unless necessary  Person educated: Patient and Child(ren) Education method: Explanation Education comprehension: verbalized understanding   HOME EXERCISE PROGRAM: None    GOALS: Goals reviewed with patient? No  SHORT TERM GOALS: Target date: 05/27/24  Pt wound to be 80% granulated  Baseline: Goal status: IN PROGRESS  2.  PT pain to be decreased to no greater than a 5/10 Baseline:  Goal status: MET    LONG TERM GOALS: Target date: 06/11/23  Pt wound to be healed  Baseline:  Goal status: IN PROGRESS  2.  Pt pain to be no greater than a 0 in her Rt LE  Baseline:  Goal status: IN PROGRESS  3.  PT to have no difficulties  washing or dressing due to wound  Baseline:  Goal status: IN PROGRESS    ASSESSMENT:  CLINICAL IMPRESSION: Wound photographed and measured today.  Drastic reduction in length as compared to last week with width remaining unchanged.  Was covered with slough and purulent drainage, however able to debride most devitalized tissue away including dry borders to promote approximation.  Cleansed well, moisturized perimeter and continued with medihoney gel.  Pt continues to need  wound care for skilled debridement and appropriate dressing to promote healing environment.    OBJECTIVE IMPAIRMENTS: pain and decreased skin integrity .    ACTIVITY LIMITATIONS: bathing and dressing   PERSONAL FACTORS: Fitness, Time since onset of injury/illness/exacerbation, and 1 comorbidity: CKI on dialysis are also affecting  patient's functional outcome.   REHAB POTENTIAL: Fair    CLINICAL DECISION MAKING: Evolving/moderate complexity  EVALUATION COMPLEXITY: Moderate  PLAN: PT FREQUENCY: 2x/week  PT DURATION: 4 weeks (06/09/24:  continue for four more weeks)  PLANNED INTERVENTIONS: 97535- Self Care and 02402- Wound care (first 20 sq cm)  PLAN FOR NEXT SESSION:  continue with debridement and dressing change Weekly measurements and photographs.     Greig KATHEE Fuse, PTA/CLT [facility] Ph: [phone]    07/01/2024, 3:32 PM

## 2024-07-02 ENCOUNTER — Ambulatory Visit: Attending: Internal Medicine

## 2024-07-02 DIAGNOSIS — I4891 Unspecified atrial fibrillation: Secondary | ICD-10-CM

## 2024-07-02 DIAGNOSIS — Z5181 Encounter for therapeutic drug level monitoring: Secondary | ICD-10-CM | POA: Diagnosis not present

## 2024-07-02 LAB — POCT INR: INR: 4.4 — AB (ref 2.0–3.0)

## 2024-07-02 MED ORDER — WARFARIN SODIUM 2.5 MG PO TABS: ORAL_TABLET | ORAL | 0 refills | Status: DC

## 2024-07-02 NOTE — Progress Notes (Signed)
Please see anticoagulation encounter.

## 2024-07-02 NOTE — Patient Instructions (Addendum)
 Description   Eat greens today. Hold warfarin tonight and only take 1 tablet tomorrow and then START taking 2 tablets daily except 3 tablets on Wednesdays and Fridays Recheck INR on Monday.

## 2024-07-03 ENCOUNTER — Encounter: Payer: Self-pay | Admitting: Nephrology

## 2024-07-06 ENCOUNTER — Other Ambulatory Visit (HOSPITAL_COMMUNITY): Payer: Self-pay

## 2024-07-06 ENCOUNTER — Ambulatory Visit: Attending: Internal Medicine | Admitting: *Deleted

## 2024-07-06 DIAGNOSIS — N186 End stage renal disease: Secondary | ICD-10-CM

## 2024-07-06 DIAGNOSIS — I4891 Unspecified atrial fibrillation: Secondary | ICD-10-CM

## 2024-07-06 DIAGNOSIS — Z5181 Encounter for therapeutic drug level monitoring: Secondary | ICD-10-CM

## 2024-07-06 LAB — POCT INR: INR: 2.6 (ref 2.0–3.0)

## 2024-07-06 NOTE — Progress Notes (Signed)
Please see anticoagulation encounter.

## 2024-07-06 NOTE — Patient Instructions (Signed)
 Continue taking warfarin 2 tablets daily except 3 tablets on Wednesdays and Fridays Recheck INR on 07/15/24 Pending AV Shunt revision 07/08/24

## 2024-07-07 ENCOUNTER — Encounter

## 2024-07-07 ENCOUNTER — Ambulatory Visit (HOSPITAL_COMMUNITY): Admitting: Physical Therapy

## 2024-07-07 DIAGNOSIS — M79661 Pain in right lower leg: Secondary | ICD-10-CM

## 2024-07-07 DIAGNOSIS — S81801S Unspecified open wound, right lower leg, sequela: Secondary | ICD-10-CM | POA: Diagnosis not present

## 2024-07-07 NOTE — Therapy (Signed)
 OUTPATIENT PHYSICAL THERAPY Wound Treatment    Patient Name: Patricia Wu MRN: [id number] DOB:[birth year], 68 y.o., female Today's Date: 07/07/2024   PCP: Re[facility] P[facility] REFERRING PROVIDER: Hyler, Vernell DEL, NP  END OF SESSION: END OF SESSION:   PT End of Session - 07/07/24 1616     Visit Number 13    Number of Visits 16    Date for PT Re-Evaluation 07/08/24    Authorization Type [employer]    Progress Note Due on Visit 16    PT Start Time 1555    PT Stop Time 1618    PT Time Calculation (min) 23 min    Equipment Utilized During Treatment Gait belt    Activity Tolerance Patient limited by pain    Behavior During Therapy WFL for tasks assessed/performed             Past Medical History:  Diagnosis Date   CKD (chronic kidney disease)    Hypercholesteremia    Hypertension    Noncompliance with medication regimen    Past Surgical History:  Procedure Laterality Date   A/V FISTULAGRAM Left 08/15/2023   Procedure: A/V Fistulagram;  Surgeon: Gretta Lonni PARAS, MD;  Location: [facility] INVASIVE CV LAB;  Service: Cardiovascular;  Laterality: Left;   AV FISTULA PLACEMENT Left 06/18/2023   Procedure: INSERTION OF LEFT ARM ARTERIOVENOUS (AV) FISTULA;  Surgeon: Oris Krystal FALCON, MD;  Location: [facility];  Service: Vascular;  Laterality: Left;   COLONOSCOPY WITH PROPOFOL  N/A 10/17/2020   Procedure: COLONOSCOPY WITH PROPOFOL ;  Surgeon: Cindie Carlin POUR, DO;  [facility];  Service: Endoscopy;  Laterality: N/A;  1:00pm   IR FLUORO GUIDE CV LINE RIGHT  05/09/2023   IR REMOVAL TUN CV CATH W/O FL  01/28/2024   IR US  GUIDE VASC ACCESS RIGHT  05/10/2023   POLYPECTOMY  10/17/2020   Procedure: POLYPECTOMY;  Surgeon: Cindie Carlin POUR, DO;  [facility]: [facility];  Service: Endoscopy;;   Patient Active Problem List   Diagnosis Date Noted   Class 2 obesity due to excess calories with body mass index (BMI) of 39.0 to 39.9 in adult 05/10/2023   Acute on chronic combined systolic and  diastolic CHF (congestive heart failure) (HCC) 05/09/2023   Hypervolemia associated with renal insufficiency 05/07/2023   ESRD needing dialysis (HCC) 05/07/2023   History of Coumadin  therapy 03/26/2023   Atrial fibrillation, controlled (HCC) 03/04/2023   Asthma, chronic 03/04/2023   Chronic kidney disease, stage IV (severe) (HCC) 02/18/2023   Anemia of chronic renal failure 02/18/2023   Acute-on-chronic kidney injury (HCC) 02/15/2022   Hyperlipidemia 02/15/2022   Obesity (BMI 30-39.9) 02/15/2022   Colon cancer screening 09/21/2020   Elevated alkaline phosphatase level 09/21/2020   Essential hypertension 02/18/2018   Elevated brain natriuretic peptide (BNP) level 02/18/2018   CKD (chronic kidney disease) stage 3, GFR 30-59 ml/min (HCC) 02/18/2018    ONSET DATE: 03/13/24  REFERRING DIAG: 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, lower legs bi-laterally  THERAPY DIAG:  Open leg wound, right, sequela  Pain in right lower leg  Rationale for Evaluation and Treatment: Rehabilitation   Wound Therapy - 07/07/24 0001     Subjective Pt states that she is having some pain with her leg today    Patient and Family Stated Goals wound to heal, less pain    Date of Onset 03/13/24    Prior Treatments self care    Pain Scale 0-10    Pain Score 6    with debridement   Pain Intervention(s)  Emotional support    Evaluation and Treatment Procedures Explained to Patient/Family Yes    Evaluation and Treatment Procedures agreed to    Wound Properties Date First Assessed: 05/13/24 Time First Assessed: 1550 Location: Leg Location Orientation: Right;Medial Wound Description (Comments): black necrotic DO NOT USE: Wound Type: Other (Comment) DO NOT USE:  Present on Admission: Yes   Dressing Type Gauze (Comment)    Dressing Changed Changed    Dressing Status Old drainage    Dressing Change Frequency PRN    Site / Wound Assessment Red;Yellow    % Wound base Red or Granulating 95%   following debridement   % Wound base  Yellow/Fibrinous Exudate 5%    Peri-wound Assessment Edema;Other (Comment)   skin is black and perimeter around the wound is fibrous   Wound Length (cm) 0.5 cm    Wound Width (cm) 1 cm    Wound Depth (cm) 0.2 cm    Wound Volume (cm^3) 0.1 cm^3    Wound Surface Area (cm^2) 0.5 cm^2    Drainage Amount Minimal    Drainage Description Purulent;No odor    Treatment Cleansed;Debridement (Selective)    Selective Debridement (non-excisional) - Location wound base    Selective Debridement (non-excisional) - Tools Used Forceps;Scalpel;Scissors    Selective Debridement (non-excisional) - Tissue Removed slough    Wound Therapy - Clinical Statement see below    Wound Therapy - Functional Problem List difficult to bath, dress    Factors Delaying/Impairing Wound Healing Infection - systemic/local;Polypharmacy;Other (comment)   on dialysis due to renal failure   Hydrotherapy Plan Debridement;Dressing change;Patient/family education;Other (comment)    Wound Therapy - Frequency 2X / week   fro 4 week s   Wound Therapy - Current Recommendations PT    Wound Plan debridement and dressing change as needed    Dressing  vaseline to perimeter, medihoney on 2X2, 4x4, gauze and netting            PATIENT EDUCATION: Education details: keep dressing dry do not remove unless necessary  Person educated: Patient and Child(ren) Education method: Explanation Education comprehension: verbalized understanding   HOME EXERCISE PROGRAM: None    GOALS: Goals reviewed with patient? No  SHORT TERM GOALS: Target date: 05/27/24  Pt wound to be 80% granulated  Baseline: Goal status: MET  2.  PT pain to be decreased to no greater than a 5/10 Baseline:  Goal status: MET    LONG TERM GOALS: Target date: 06/11/23  Pt wound to be healed  Baseline:  Goal status: IN PROGRESS  2.  Pt pain to be no greater than a 0 in her Rt LE  Baseline:  Goal status: IN PROGRESS  3.  PT to have no difficulties washing or  dressing due to wound  Baseline:  Goal status: IN PROGRESS    ASSESSMENT:  CLINICAL IMPRESSION: Wound is covered with slough and purulent drainage following removal of dressing.  Therapist was able to debride most devitalized tissue away including dry borders to promote approximation.  Cleansed well, moisturized perimeter and continued with medihoney gel.  Pt continues to need  wound care for skilled debridement and appropriate dressing to promote healing environment.    OBJECTIVE IMPAIRMENTS: pain and decreased skin integrity .    ACTIVITY LIMITATIONS: bathing and dressing   PERSONAL FACTORS: Fitness, Time since onset of injury/illness/exacerbation, and 1 comorbidity: CKI on dialysis are also affecting patient's functional outcome.   REHAB POTENTIAL: Fair    CLINICAL DECISION MAKING: Evolving/moderate complexity  EVALUATION  COMPLEXITY: Moderate  PLAN: PT FREQUENCY: 2x/week  PT DURATION: 4 weeks (06/09/24:  continue for four more weeks)  PLANNED INTERVENTIONS: 97535- Self Care and 02402- Wound care (first 20 sq cm)  PLAN FOR NEXT SESSION: Pt wound has approximated well, pt might be ready for discharge after next session if wound is closed.  [facility] Ph: [phone]    07/07/2024, 4:20 PM

## 2024-07-08 ENCOUNTER — Ambulatory Visit (HOSPITAL_COMMUNITY)
Admission: RE | Admit: 2024-07-08 | Discharge: 2024-07-08 | Disposition: A | Discharge status: Home / Self Care | Attending: Vascular Surgery | Admitting: Vascular Surgery

## 2024-07-08 ENCOUNTER — Encounter (HOSPITAL_COMMUNITY)
Admission: RE | Disposition: A | Discharge status: Home / Self Care | Payer: Self-pay | Source: Home / Self Care | Attending: Vascular Surgery

## 2024-07-08 DIAGNOSIS — T82858A Stenosis of vascular prosthetic devices, implants and grafts, initial encounter: Secondary | ICD-10-CM | POA: Diagnosis not present

## 2024-07-08 DIAGNOSIS — I120 Hypertensive chronic kidney disease with stage 5 chronic kidney disease or end stage renal disease: Secondary | ICD-10-CM | POA: Diagnosis not present

## 2024-07-08 DIAGNOSIS — N186 End stage renal disease: Secondary | ICD-10-CM | POA: Diagnosis not present

## 2024-07-08 DIAGNOSIS — Z992 Dependence on renal dialysis: Secondary | ICD-10-CM

## 2024-07-08 DIAGNOSIS — Y832 Surgical operation with anastomosis, bypass or graft as the cause of abnormal reaction of the patient, or of later complication, without mention of misadventure at the time of the procedure: Secondary | ICD-10-CM | POA: Diagnosis not present

## 2024-07-08 HISTORY — PX: VENOUS ANGIOPLASTY: CATH118376

## 2024-07-08 HISTORY — PX: A/V SHUNT INTERVENTION: CATH118220

## 2024-07-08 SURGERY — A/V SHUNT INTERVENTION
Anesthesia: LOCAL | Site: Arm Upper | Laterality: Left

## 2024-07-08 MED ORDER — FENTANYL CITRATE (PF) 100 MCG/2ML IJ SOLN
INTRAMUSCULAR | Status: AC
  Filled 2024-07-08: qty 2

## 2024-07-08 MED ORDER — LIDOCAINE HCL (PF) 1 % IJ SOLN
INTRAMUSCULAR | Status: AC
  Filled 2024-07-08: qty 30

## 2024-07-08 MED ORDER — LIDOCAINE HCL (PF) 1 % IJ SOLN
INTRAMUSCULAR | Status: DC | PRN
Start: 2024-07-08 — End: 2024-07-08
  Administered 2024-07-08: 5 mL

## 2024-07-08 MED ORDER — MIDAZOLAM HCL 2 MG/2ML IJ SOLN
INTRAMUSCULAR | Status: DC | PRN
  Administered 2024-07-08: 1 mg via INTRAVENOUS

## 2024-07-08 MED ORDER — MIDAZOLAM HCL 2 MG/2ML IJ SOLN
INTRAMUSCULAR | Status: AC
  Filled 2024-07-08: qty 2

## 2024-07-08 MED ORDER — FENTANYL CITRATE (PF) 100 MCG/2ML IJ SOLN
INTRAMUSCULAR | Status: DC | PRN
  Administered 2024-07-08: 50 ug via INTRAVENOUS

## 2024-07-08 MED ORDER — HEPARIN (PORCINE) IN NACL 1000-0.9 UT/500ML-% IV SOLN
INTRAVENOUS | Status: DC | PRN
  Administered 2024-07-08: 500 mL

## 2024-07-08 MED ORDER — IODIXANOL 320 MG/ML IV SOLN
INTRAVENOUS | Status: DC | PRN
Start: 2024-07-08 — End: 2024-07-08
  Administered 2024-07-08: 40 mL via INTRAVENOUS

## 2024-07-08 SURGICAL SUPPLY — 10 items
BALLOON MUSTANG 7X80X75 (BALLOONS) IMPLANT
KIT ENCORE 26 ADVANTAGE (KITS) IMPLANT
KIT MICROPUNCTURE NIT STIFF (SHEATH) IMPLANT
KIT PV (KITS) ×2 IMPLANT
SET MICROPUNCTURE 5F STIFF (MISCELLANEOUS) IMPLANT
SHEATH PINNACLE R/O II 6F 4CM (SHEATH) IMPLANT
SHEATH PROBE COVER 6X72 (BAG) IMPLANT
TRAY PV CATH (CUSTOM PROCEDURE TRAY) ×2 IMPLANT
TUBING CIL FLEX 10 FLL-RA (TUBING) IMPLANT
WIRE BENTSON .035X145CM (WIRE) IMPLANT

## 2024-07-08 NOTE — Op Note (Signed)
    Patient name: Patricia Wu MRN: [id number] DOB: [birth year] Sex: female  07/08/2024 Pre-operative Diagnosis: ESRD on HD Post-operative diagnosis:  Same Surgeon:  Norman GORMAN Serve, MD Procedure Performed:  Ultrasound-guided access of left arm AV fistula Fistulogram and central venogram Balloon angioplasty of left arm cephalic arch stenosis, 7 mm x 80 mm Mustang 9 minutes of moderate sedation with fentanyl  and Versed   Indications: Ms. Panex is a 68 year old female with ESRD who presented to the HD access center for fistulogram today.  She has been having nonspecific issues with dialysis but has had some significant infiltration and hematoma throughout the upper arm.  Her last HD session was yesterday.  Recent benefits of fistulogram with intervention were reviewed, she expressed understanding and elects to proceed.  Findings:  Widely patent central venous system.  Tandem stenoses of the cephalic arch approximate 70% stenosis.  Widely patent AV fistula and widely patent anastomosis. Moderate size branch near the proximal aspect of the fistula that fills down the cephalic vein of the forearm.   Procedure:  The patient was identified in the holding area and taken to the cath lab  The patient was then placed supine on the table and prepped and draped in the usual sterile fashion.  A time out was called.  Ultrasound was used to evaluate the left arm AV access. This was accessed under u/s guidance. An 018 wire was advanced without resistance, a micropuncture sheath was placed and fistulagram obtained which demonstrated the above findings.  50 mcg of fentanyl  and 1 mg of Versed  was administered.  This access was then upsized to a 8F short sheath over a Bentson wire.  The Bentson wire was used to cross the stenosis and was placed in the central venous system.  The lesion was then treated with a 7 mm x 80 mm Mustang balloon.  This resulted in a less than 20% residual stenosis.  Contrast: 40  cc Sedation: 9 minutes  Impression: Adequate result of balloon angioplasty of the cephalic arch stenosis from 70% down to 20% residual stenosis.   Norman GORMAN Serve MD [facility] [place] Office: [phone]

## 2024-07-08 NOTE — H&P (Signed)
  HD ACCESS CENTER H&P   Patient ID: Patricia Wu, female   DOB: 11/27/1956, 68 y.o.   MRN: [id number]  Subjective:     HPI Patricia Wu is a 68 y.o. female with ESRD presenting to the HD access center for intervention.  Past Medical History:  Diagnosis Date   CKD (chronic kidney disease)    Hypercholesteremia    Hypertension    Noncompliance with medication regimen    Family History  Problem Relation Age of Onset   Liver cancer Neg Hx    Colon cancer Neg Hx    Past Surgical History:  Procedure Laterality Date   A/V FISTULAGRAM Left 08/15/2023   Procedure: A/V Fistulagram;  Surgeon: Gretta Lonni PARAS, MD;  Location: [facility] INVASIVE CV LAB;  Service: Cardiovascular;  Laterality: Left;   AV FISTULA PLACEMENT Left 06/18/2023   Procedure: INSERTION OF LEFT ARM ARTERIOVENOUS (AV) FISTULA;  Surgeon: Oris Krystal FALCON, MD;  Location: [facility] ORS;  Service: Vascular;  Laterality: Left;   COLONOSCOPY WITH PROPOFOL  N/A 10/17/2020   Procedure: COLONOSCOPY WITH PROPOFOL ;  Surgeon: Cindie Carlin POUR, DO;  Location: [facility];  Service: Endoscopy;  Laterality: N/A;  1:00pm   IR FLUORO GUIDE CV LINE RIGHT  05/09/2023   IR REMOVAL TUN CV CATH W/O FL  01/28/2024   IR US  GUIDE VASC ACCESS RIGHT  05/10/2023   POLYPECTOMY  10/17/2020   Procedure: POLYPECTOMY;  Surgeon: Cindie Carlin POUR, DO;  Location: [facility];  Service: Endoscopy;;    Short Social History:  Social History   Tobacco Use   Smoking status: Never    Passive exposure: Never   Smokeless tobacco: Never  Substance Use Topics   Alcohol use: No    Allergies  Allergen Reactions   Ibuprofen Other (See Comments)    pcp said raises her BP    No current facility-administered medications for this encounter.    REVIEW OF SYSTEMS All other systems were reviewed and are negative     Objective:   Objective   Vitals:   07/08/24 0812 07/08/24 0824  BP: 133/73 133/73  Pulse: 86 70  Resp: 12 14  Temp: 98 F (36.7 C)    TempSrc: Oral   SpO2: 97% 99%   There is no height or weight on file to calculate BMI.  Physical Exam General: no acute distress Cardiac: hemodynamically stable Extremities: Palpable thrill and pulse of the arm AV fistula, significant infiltration and hematoma in the left upper arm  Data: Fistulogram from Dr. Gretta in August 2024 reviewed No stenosis was noted     Assessment/Plan:   Patricia Wu is a 68 y.o. female with ESRD presenting for fistulogram.  Having nonspecific issues with dialysis. Last HD session yesterday. Reviewed risks and benefits of fistulogram with intervention and patient agreed to proceed.   Norman Serve, MD [facility]

## 2024-07-09 ENCOUNTER — Encounter (HOSPITAL_COMMUNITY): Payer: Self-pay | Admitting: Vascular Surgery

## 2024-07-09 ENCOUNTER — Ambulatory Visit (HOSPITAL_COMMUNITY): Admitting: Physical Therapy

## 2024-07-09 DIAGNOSIS — M79661 Pain in right lower leg: Secondary | ICD-10-CM

## 2024-07-09 DIAGNOSIS — S81801S Unspecified open wound, right lower leg, sequela: Secondary | ICD-10-CM

## 2024-07-09 NOTE — Therapy (Addendum)
 OUTPATIENT PHYSICAL THERAPY Wound Treatment    Patient Name: Patricia Wu MRN: [id number] DOB:May 16, 1956, 68 y.o., female Today's Date: 07/09/2024   PCP: Reyes Pines REFERRING PROVIDER: Hyler, Vernell DEL, NP  END OF SESSION: END OF SESSION:   PT End of Session - 07/09/24 1615     Visit Number 14    Number of Visits 16    Date for PT Re-Evaluation 07/22/24    Authorization Type [employer]    Progress Note Due on Visit 16    PT Start Time 1548    PT Stop Time 1618    PT Time Calculation (min) 30 min    Equipment Utilized During Treatment Gait belt    Activity Tolerance Patient limited by pain    Behavior During Therapy WFL for tasks assessed/performed             Past Medical History:  Diagnosis Date   CKD (chronic kidney disease)    Hypercholesteremia    Hypertension    Noncompliance with medication regimen    Past Surgical History:  Procedure Laterality Date   A/V FISTULAGRAM Left 08/15/2023   Procedure: A/V Fistulagram;  Surgeon: Gretta Lonni PARAS, MD;  Location: [facility] INVASIVE CV LAB;  Service: Cardiovascular;  Laterality: Left;   A/V SHUNT INTERVENTION Left 07/08/2024   Procedure: A/V SHUNT INTERVENTION;  Surgeon: Pearline Norman RAMAN, MD;  Location: [facility] PV LAB;  Service: Cardiovascular;  Laterality: Left;   AV FISTULA PLACEMENT Left 06/18/2023   Procedure: INSERTION OF LEFT ARM ARTERIOVENOUS (AV) FISTULA;  Surgeon: Oris Krystal FALCON, MD;  Location: [facility];  Service: Vascular;  Laterality: Left;   COLONOSCOPY WITH PROPOFOL  N/A 10/17/2020   Procedure: COLONOSCOPY WITH PROPOFOL ;  Surgeon: Cindie Carlin POUR, DO;  [facility];  Service: Endoscopy;  Laterality: N/A;  1:00pm   IR FLUORO GUIDE CV LINE RIGHT  05/09/2023   IR REMOVAL TUN CV CATH W/O FL  01/28/2024   IR US  GUIDE VASC ACCESS RIGHT  05/10/2023   POLYPECTOMY  10/17/2020   Procedure: POLYPECTOMY;  Surgeon: Cindie Carlin POUR, DO;  [facility]: [facility];  Service: Endoscopy;;   VENOUS ANGIOPLASTY   07/08/2024   Procedure: VENOUS ANGIOPLASTY;  Surgeon: Pearline Norman RAMAN, MD;  Location: [facility];  Service: Cardiovascular;;   Patient Active Problem List   Diagnosis Date Noted   Class 2 obesity due to excess calories with body mass index (BMI) of 39.0 to 39.9 in adult 05/10/2023   Acute on chronic combined systolic and diastolic CHF (congestive heart failure) (HCC) 05/09/2023   Hypervolemia associated with renal insufficiency 05/07/2023   ESRD needing dialysis (HCC) 05/07/2023   History of Coumadin  therapy 03/26/2023   Atrial fibrillation, controlled (HCC) 03/04/2023   Asthma, chronic 03/04/2023   Chronic kidney disease, stage IV (severe) (HCC) 02/18/2023   Anemia of chronic renal failure 02/18/2023   Acute-on-chronic kidney injury (HCC) 02/15/2022   Hyperlipidemia 02/15/2022   Obesity (BMI 30-39.9) 02/15/2022   Colon cancer screening 09/21/2020   Elevated alkaline phosphatase level 09/21/2020   Essential hypertension 02/18/2018   Elevated brain natriuretic peptide (BNP) level 02/18/2018   CKD (chronic kidney disease) stage 3, GFR 30-59 ml/min (HCC) 02/18/2018    ONSET DATE: 03/13/24  REFERRING DIAG: 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, lower legs bi-laterally  THERAPY DIAG:  Open leg wound, right, sequela  Pain in right lower leg  Rationale for Evaluation and Treatment: Rehabilitation   Wound Therapy - 07/09/24 0001     Subjective PT has no complaints  Patient and Family Stated Goals wound to heal, less pain    Date of Onset 03/13/24    Prior Treatments self care    Pain Scale 0-10    Pain Score 3    with debridement   Pain Intervention(s) Emotional support    Evaluation and Treatment Procedures Explained to Patient/Family Yes    Evaluation and Treatment Procedures agreed to    Wound Properties Date First Assessed: 05/13/24 Time First Assessed: 1550 Location: Leg Location Orientation: Right;Medial Wound Description (Comments): black necrotic DO NOT USE: Wound Type: Other  (Comment) DO NOT USE:  Present on Admission: Yes   Wound Image Images linked: 1    Dressing Type Gauze (Comment)    Dressing Changed Changed    Dressing Status Old drainage    Dressing Change Frequency PRN    Site / Wound Assessment Red;Yellow    % Wound base Red or Granulating --   98   % Wound base Yellow/Fibrinous Exudate --   2   Peri-wound Assessment Edema;Other (Comment)   skin is black and perimeter around the wound is fibrous   Wound Length (cm) 0.4 cm    Wound Width (cm) 1.2 cm    Wound Depth (cm) 0.2 cm   depth is .2 in pocketed areas   Wound Volume (cm^3) 0.1 cm^3    Wound Surface Area (cm^2) 0.48 cm^2    Drainage Amount Minimal    Drainage Description No odor    Treatment Cleansed;Debridement (Selective)    Selective Debridement (non-excisional) - Location wound base    Selective Debridement (non-excisional) - Tools Used Forceps;Scalpel;Scissors    Selective Debridement (non-excisional) - Tissue Removed slough    Wound Therapy - Clinical Statement see below    Wound Therapy - Functional Problem List difficult to bath, dress    Factors Delaying/Impairing Wound Healing Infection - systemic/local;Polypharmacy;Other (comment)   on dialysis due to renal failure   Hydrotherapy Plan Debridement;Dressing change;Patient/family education;Other (comment)    Wound Therapy - Frequency 2X / week   fro 4 week s   Wound Therapy - Current Recommendations PT    Wound Plan debridement and dressing change as needed    Dressing  vaseline to perimeter, medihoney on 2X2, 4x4, gauze and netting    Manual Therapy to edges of wound.            PATIENT EDUCATION: Education details: keep dressing dry do not remove unless necessary  Person educated: Patient and Child(ren) Education method: Explanation Education comprehension: verbalized understanding   HOME EXERCISE PROGRAM: None    GOALS: Goals reviewed with patient? No  SHORT TERM GOALS: Target date: 05/27/24  Pt wound to be 80%  granulated  Baseline: Goal status: MET  2.  PT pain to be decreased to no greater than a 5/10 Baseline:  Goal status: MET    LONG TERM GOALS: Target date: 06/11/23  Pt wound to be healed  Baseline:  Goal status: IN PROGRESS  2.  Pt pain to be no greater than a 0 in her Rt LE  Baseline:  Goal status: IN PROGRESS  3.  PT to have no difficulties washing or dressing due to wound  Baseline:  Goal status: IN PROGRESS    ASSESSMENT:  CLINICAL IMPRESSION: Pt wound continues to be covered with biofilm once dressing is removed but is able to be debrided now even with pt low tolerance.  PT wound generally has a depth of .1cm but there are pockets of depth that goes to  0.2 cm.  Pt will continue to benefit from skilled PT until wound is healed.   OBJECTIVE IMPAIRMENTS: pain and decreased skin integrity .    ACTIVITY LIMITATIONS: bathing and dressing   PERSONAL FACTORS: Fitness, Time since onset of injury/illness/exacerbation, and 1 comorbidity: CKI on dialysis are also affecting patient's functional outcome.   REHAB POTENTIAL: Fair    CLINICAL DECISION MAKING: Evolving/moderate complexity  EVALUATION COMPLEXITY: Moderate  PLAN: PT FREQUENCY: 2x/week  PT DURATION: 4 weeks (06/09/24:  continue for four more weeks)Pt to be seen for 2 more weeks.   PLANNED INTERVENTIONS: 97535- Self Care and 02402- Wound care (first 20 sq cm)  PLAN FOR NEXT SESSION: Pt wound continuees to approximate, anticipate full closure in the next two weeks   Montie Metro PT/CLT [facility] Ph: [phone]    07/09/2024, 4:22 PM

## 2024-07-14 ENCOUNTER — Ambulatory Visit (HOSPITAL_COMMUNITY): Admitting: Physical Therapy

## 2024-07-14 DIAGNOSIS — S81801S Unspecified open wound, right lower leg, sequela: Secondary | ICD-10-CM

## 2024-07-14 DIAGNOSIS — M79661 Pain in right lower leg: Secondary | ICD-10-CM

## 2024-07-14 NOTE — Therapy (Signed)
 OUTPATIENT PHYSICAL THERAPY Wound Treatment    Patient Name: Patricia Wu MRN: [id number] DOB:Jul 22, 1956, 68 y.o., female Today's Date: 07/14/2024   PCP: Reyes Pines REFERRING PROVIDER: Hyler, Vernell DEL, NP  END OF SESSION: END OF SESSION:   PT End of Session - 07/14/24 1436     Visit Number 15    Number of Visits 16    Date for PT Re-Evaluation 07/22/24    Authorization Type [employer]    Progress Note Due on Visit 16    PT Start Time 1348    PT Stop Time 1414    PT Time Calculation (min) 26 min    Equipment Utilized During Treatment none   Activity Tolerance Patient limited by pain    Behavior During Therapy WFL for tasks assessed/performed             Past Medical History:  Diagnosis Date   CKD (chronic kidney disease)    Hypercholesteremia    Hypertension    Noncompliance with medication regimen    Past Surgical History:  Procedure Laterality Date   A/V FISTULAGRAM Left 08/15/2023   Procedure: A/V Fistulagram;  Surgeon: Gretta Lonni PARAS, MD;  Location: [facility] INVASIVE CV LAB;  Service: Cardiovascular;  Laterality: Left;   A/V SHUNT INTERVENTION Left 07/08/2024   Procedure: A/V SHUNT INTERVENTION;  Surgeon: Pearline Norman RAMAN, MD;  Location: [facility];  Service: Cardiovascular;  Laterality: Left;   AV FISTULA PLACEMENT Left 06/18/2023   Procedure: INSERTION OF LEFT ARM ARTERIOVENOUS (AV) FISTULA;  Surgeon: Oris Krystal FALCON, MD;  Location: [facility];  Service: Vascular;  Laterality: Left;   COLONOSCOPY WITH PROPOFOL  N/A 10/17/2020   Procedure: COLONOSCOPY WITH PROPOFOL ;  Surgeon: Cindie Carlin POUR, DO;  Location: [facility];  Service: Endoscopy;  Laterality: N/A;  1:00pm   IR FLUORO GUIDE CV LINE RIGHT  05/09/2023   IR REMOVAL TUN CV CATH W/O FL  01/28/2024   IR US  GUIDE VASC ACCESS RIGHT  05/10/2023   POLYPECTOMY  10/17/2020   Procedure: POLYPECTOMY;  Surgeon: Cindie Carlin POUR, DO;  Location: [facility];  Service: Endoscopy;;   VENOUS ANGIOPLASTY   07/08/2024   Procedure: VENOUS ANGIOPLASTY;  Surgeon: Pearline Norman RAMAN, MD;  Location: [facility];  Service: Cardiovascular;;   Patient Active Problem List   Diagnosis Date Noted   Class 2 obesity due to excess calories with body mass index (BMI) of 39.0 to 39.9 in adult 05/10/2023   Acute on chronic combined systolic and diastolic CHF (congestive heart failure) (HCC) 05/09/2023   Hypervolemia associated with renal insufficiency 05/07/2023   ESRD needing dialysis (HCC) 05/07/2023   History of Coumadin  therapy 03/26/2023   Atrial fibrillation, controlled (HCC) 03/04/2023   Asthma, chronic 03/04/2023   Chronic kidney disease, stage IV (severe) (HCC) 02/18/2023   Anemia of chronic renal failure 02/18/2023   Acute-on-chronic kidney injury (HCC) 02/15/2022   Hyperlipidemia 02/15/2022   Obesity (BMI 30-39.9) 02/15/2022   Colon cancer screening 09/21/2020   Elevated alkaline phosphatase level 09/21/2020   Essential hypertension 02/18/2018   Elevated brain natriuretic peptide (BNP) level 02/18/2018   CKD (chronic kidney disease) stage 3, GFR 30-59 ml/min (HCC) 02/18/2018    ONSET DATE: 03/13/24  REFERRING DIAG: 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    Patient and Family Stated Goals wound to heal, less pain    Date of Onset 03/13/24    Prior Treatments self care    Pain Scale 0-10    Pain Score 4    with debridement   Pain Intervention(s) Emotional support    Evaluation and Treatment Procedures Explained to Patient/Family Yes    Evaluation and Treatment Procedures agreed to    Wound Properties Date First Assessed: 05/13/24 Time First Assessed: 1550 Location: Leg Location Orientation:  Right;Medial Wound Description (Comments): black necrotic DO NOT USE: Wound Type: Other (Comment) DO NOT USE:  Present on Admission: Yes   Wound Image Images linked: 1    Dressing Type Gauze (Comment)    Dressing Changed Changed    Dressing Status Old drainage    Dressing Change Frequency PRN    Site / Wound Assessment Red;Yellow    % Wound base Red or Granulating --   98% prior to debridement   Peri-wound Assessment Edema;Other (Comment)   skin is black and perimeter around the wound is fibrous   Wound Length (cm) 0.3 cm    Wound Width (cm) 1 cm    Wound Depth (cm) 0.2 cm    Wound Volume (cm^3) 0.06 cm^3    Wound Surface Area (cm^2) 0.3 cm^2    Undermining (cm) along inferior and lateral edge    Drainage Amount Minimal    Drainage Description No odor;Purulent   comes from the undermining area.   Treatment Cleansed;Debridement (Selective)    Selective Debridement (non-excisional) - Location wound base    Selective Debridement (non-excisional) - Tools Used Forceps;Scalpel;Scissors    Selective Debridement (non-excisional) - Tissue Removed slough    Wound Therapy - Clinical Statement see below    Wound Therapy - Functional Problem List difficult to bath, dress    Factors Delaying/Impairing Wound Healing Infection - systemic/local;Polypharmacy;Other (comment)   on dialysis due to renal failure   Hydrotherapy Plan Debridement;Dressing change;Patient/family education;Other (comment)    Wound Therapy - Frequency 2X / week   fro 4 week s   Wound Therapy - Current Recommendations PT    Wound Plan debridement and dressing change as needed    Dressing  vaseline to perimeter, medihoney on 2X2, 4x4, gauze and netting    Manual Therapy to edges of wound.            PATIENT EDUCATION: Education details: keep dressing dry do not remove unless necessary  Person educated: Patient and Child(ren) Education method: Explanation Education comprehension: verbalized understanding   HOME EXERCISE  PROGRAM: None    GOALS: Goals reviewed with patient? No  SHORT TERM GOALS: Target date: 05/27/24  Pt wound to be 80% granulated  Baseline: Goal status: MET  2.  PT pain to be decreased to no greater than a 5/10 Baseline:  Goal status: MET    LONG TERM GOALS: Target date: 06/11/23  Pt wound to be healed  Baseline:  Goal status: IN PROGRESS  2.  Pt pain to be no greater than a 0 in her Rt LE  Baseline:  Goal status: IN PROGRESS  3.  PT to have no difficulties washing or dressing due to wound  Baseline:  Goal status: IN PROGRESS    ASSESSMENT:  CLINICAL IMPRESSION: Pt wound continues to approximate.  Noted purulent drainage from undermining areas which is able to be debrided.   PT wound generally  has a depth of .1cm but there are pockets of depth that goes to 0.2 cm.  Pt will continue to benefit from skilled PT until wound is healed.   OBJECTIVE IMPAIRMENTS: pain and decreased skin integrity .    ACTIVITY LIMITATIONS: bathing and dressing   PERSONAL FACTORS: Fitness, Time since onset of injury/illness/exacerbation, and 1 comorbidity: CKI on dialysis are also affecting patient's functional outcome.   REHAB POTENTIAL: Fair    CLINICAL DECISION MAKING: Evolving/moderate complexity  EVALUATION COMPLEXITY: Moderate  PLAN: PT FREQUENCY: 2x/week  PT DURATION: 4 weeks (06/09/24:  continue for four more weeks)Pt to be seen for 2 more weeks.   PLANNED INTERVENTIONS: 97535- Self Care and 02402- Wound care (first 20 sq cm)  PLAN FOR NEXT SESSION: Pt wound continuees to approximate, anticipate full closure in the next two weeks    Montie Metro, PT CLT [phone]  [facility]     07/14/2024, 2:41 PM

## 2024-07-15 ENCOUNTER — Ambulatory Visit: Attending: Internal Medicine | Admitting: *Deleted

## 2024-07-15 DIAGNOSIS — I4891 Unspecified atrial fibrillation: Secondary | ICD-10-CM

## 2024-07-15 DIAGNOSIS — Z5181 Encounter for therapeutic drug level monitoring: Secondary | ICD-10-CM | POA: Diagnosis not present

## 2024-07-15 LAB — POCT INR: INR: 2.7 (ref 2.0–3.0)

## 2024-07-15 NOTE — Progress Notes (Signed)
 Please see anticoagulation encounter 2.7

## 2024-07-15 NOTE — Patient Instructions (Signed)
 Continue taking warfarin 2 tablets daily except 3 tablets on Wednesdays and Fridays Recheck INR on 08/04/24

## 2024-07-16 ENCOUNTER — Ambulatory Visit (HOSPITAL_COMMUNITY)

## 2024-07-16 DIAGNOSIS — M79661 Pain in right lower leg: Secondary | ICD-10-CM

## 2024-07-16 DIAGNOSIS — S81801S Unspecified open wound, right lower leg, sequela: Secondary | ICD-10-CM | POA: Diagnosis not present

## 2024-07-16 NOTE — Therapy (Signed)
 OUTPATIENT PHYSICAL THERAPY Wound Treatment    Patient Name: Patricia Wu MRN: [id number] DOB:1956/07/25, 68 y.o., female Today's Date: 07/16/2024   PCP: [facility] REFERRING PROVIDER: Hyler, Vernell DEL, NP  END OF SESSION: END OF SESSION:  END OF SESSION:   PT End of Session - 07/16/24 1257     Visit Number 16    Number of Visits 24    Date for PT Re-Evaluation 08/13/24    Authorization Type [employer]    Progress Note Due on Visit 24   PT Start Time 1300    PT Stop Time 1328    PT Time Calculation (min) 28 min    Activity Tolerance Patient limited by pain    Behavior During Therapy WFL for tasks assessed/performed          Past Medical History:  Diagnosis Date   CKD (chronic kidney disease)    Hypercholesteremia    Hypertension    Noncompliance with medication regimen    Past Surgical History:  Procedure Laterality Date   A/V FISTULAGRAM Left 08/15/2023   Procedure: A/V Fistulagram;  Surgeon: Gretta Lonni PARAS, MD;  [facility];  Service: Cardiovascular;  Laterality: Left;   A/V SHUNT INTERVENTION Left 07/08/2024   Procedure: A/V SHUNT INTERVENTION;  Surgeon: Pearline Norman RAMAN, MD;  Location: [facility];  Service: Cardiovascular;  Laterality: Left;   AV FISTULA PLACEMENT Left 06/18/2023   Procedure: INSERTION OF LEFT ARM ARTERIOVENOUS (AV) FISTULA;  Surgeon: Oris Krystal FALCON, MD;  Location: [facility];  Service: Vascular;  Laterality: Left;   COLONOSCOPY WITH PROPOFOL  N/A 10/17/2020   Procedure: COLONOSCOPY WITH PROPOFOL ;  Surgeon: Cindie Carlin POUR, DO;  [facility]: [facility];  Service: Endoscopy;  Laterality: N/A;  1:00pm   IR FLUORO GUIDE CV LINE RIGHT  05/09/2023   IR REMOVAL TUN CV CATH W/O FL  01/28/2024   IR US  GUIDE VASC ACCESS RIGHT  05/10/2023   POLYPECTOMY  10/17/2020   Procedure: POLYPECTOMY;  Surgeon: Cindie Carlin POUR, DO;  [facility]: [facility];  Service: Endoscopy;;   VENOUS ANGIOPLASTY  07/08/2024   Procedure: VENOUS  ANGIOPLASTY;  Surgeon: Pearline Norman RAMAN, MD;  Location: [facility];  Service: Cardiovascular;;   Patient Active Problem List   Diagnosis Date Noted   Class 2 obesity due to excess calories with body mass index (BMI) of 39.0 to 39.9 in adult 05/10/2023   Acute on chronic combined systolic and diastolic CHF (congestive heart failure) (HCC) 05/09/2023   Hypervolemia associated with renal insufficiency 05/07/2023   ESRD needing dialysis (HCC) 05/07/2023   History of Coumadin  therapy 03/26/2023   Atrial fibrillation, controlled (HCC) 03/04/2023   Asthma, chronic 03/04/2023   Chronic kidney disease, stage IV (severe) (HCC) 02/18/2023   Anemia of chronic renal failure 02/18/2023   Acute-on-chronic kidney injury (HCC) 02/15/2022   Hyperlipidemia 02/15/2022   Obesity (BMI 30-39.9) 02/15/2022   Colon cancer screening 09/21/2020   Elevated alkaline phosphatase level 09/21/2020   Essential hypertension 02/18/2018   Elevated brain natriuretic peptide (BNP) level 02/18/2018   CKD (chronic kidney disease) stage 3, GFR 30-59 ml/min (HCC) 02/18/2018    ONSET DATE: 03/13/24  REFERRING DIAG: 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    Patient and  Family Stated Goals wound to heal, less pain    Date of Onset 03/13/24    Prior Treatments self care    Pain Scale 0-10    Pain Score 8     Pain Type Acute pain    Pain Intervention(s) Emotional support;Repositioned    Evaluation and Treatment Procedures Explained to Patient/Family Yes    Evaluation and Treatment Procedures agreed to    Wound Properties Date First Assessed: 05/13/24 Time First Assessed: 1550 Location: Leg Location Orientation: Right;Medial Wound Description (Comments): black necrotic DO NOT USE: Wound  Type: Other (Comment) DO NOT USE:  Present on Admission: Yes   Wound Image Images linked: 1    Dressing Type Gauze (Comment)    Dressing Changed Changed    Dressing Status Old drainage    Dressing Change Frequency PRN    Site / Wound Assessment Red;Yellow    % Wound base Red or Granulating --   98% following debridement   % Wound base Yellow/Fibrinous Exudate --   2% following debridement   Peri-wound Assessment Edema;Other (Comment)   skin is black and perimeter around the wound is fibrous   Wound Length (cm) 0.2 cm    Wound Width (cm) 0.8 cm    Wound Depth (cm) 0.2 cm    Wound Volume (cm^3) 0.03 cm^3    Wound Surface Area (cm^2) 0.16 cm^2    Undermining (cm) --   along inferior and lateral edges   Drainage Amount Minimal    Drainage Description No odor;Purulent    Treatment Cleansed;Debridement (Selective)    Selective Debridement (non-excisional) - Location wound base    Selective Debridement (non-excisional) - Tools Used Forceps;Scalpel;Scissors    Selective Debridement (non-excisional) - Tissue Removed slough    Wound Therapy - Clinical Statement see below    Wound Therapy - Functional Problem List difficult to bath, dress    Factors Delaying/Impairing Wound Healing Infection - systemic/local;Polypharmacy;Other (comment)    Hydrotherapy Plan Debridement;Dressing change;Patient/family education;Other (comment)    Wound Therapy - Frequency 2X / week    Wound Therapy - Current Recommendations PT    Wound Plan debridement and dressing change as needed    Dressing  vaseline to perimeter, medihoney on 2X2, 4x4, gauze and netting    Manual Therapy to edges of wound.             PATIENT EDUCATION: Education details: keep dressing dry do not remove unless necessary  Person educated: Patient and Child(ren) Education method: Explanation Education comprehension: verbalized understanding   HOME EXERCISE PROGRAM: None    GOALS: Goals reviewed with patient? No  SHORT TERM  GOALS: Target date: 05/27/24  Pt wound to be 80% granulated  Baseline: Goal status: MET  2.  PT pain to be decreased to no greater than a 5/10 Baseline:  Goal status: MET    LONG TERM GOALS: Target date: 06/11/23  Pt wound to be healed  Baseline:  Goal status: IN PROGRESS  2.  Pt pain to be no greater than a 0 in her Rt LE  Baseline:  Goal status: IN PROGRESS  3.  PT to have no difficulties washing or dressing due to wound  Baseline:  Goal status: IN PROGRESS    ASSESSMENT:  CLINICAL IMPRESSION: Pt wound continues to approximate.  Purulent drainage from undermining areas which is able to be debridement.  Pt limited by pain during cleansing and debridement.  Measurements taken with noted reduction in size.  Pt will continue to benefit from skill  interventoin until wound is healed.   OBJECTIVE IMPAIRMENTS: pain and decreased skin integrity .    ACTIVITY LIMITATIONS: bathing and dressing   PERSONAL FACTORS: Fitness, Time since onset of injury/illness/exacerbation, and 1 comorbidity: CKI on dialysis are also affecting patient's functional outcome.   REHAB POTENTIAL: Fair    CLINICAL DECISION MAKING: Evolving/moderate complexity  EVALUATION COMPLEXITY: Moderate  PLAN: PT FREQUENCY: 2x/week  PT DURATION: 4 weeks (06/09/24:  continue for four more weeks)Pt to be seen for 2 more weeks. /7/24:  continue 2x a week x 4 weeks or until wound is healed  PLANNED INTERVENTIONS: 97535- Self Care and 02402- Wound care (first 20 sq cm)  PLAN FOR NEXT SESSION: Pt wound continuees to approximate, anticipate full closure in the next two weeks    Augustin Mclean, LPTA/CLT; [facility] [phone]   07/16/2024, 1:43 PM

## 2024-07-17 NOTE — Addendum Note (Signed)
 Addended by: NELWYN MONTIE PARAS on: 07/17/2024 08:21 AM   Modules accepted: Orders

## 2024-07-21 ENCOUNTER — Encounter: Payer: Self-pay | Admitting: Family Medicine

## 2024-07-21 ENCOUNTER — Ambulatory Visit (HOSPITAL_COMMUNITY): Admitting: Physical Therapy

## 2024-07-21 DIAGNOSIS — S81801S Unspecified open wound, right lower leg, sequela: Secondary | ICD-10-CM

## 2024-07-21 DIAGNOSIS — M79661 Pain in right lower leg: Secondary | ICD-10-CM

## 2024-07-21 NOTE — Therapy (Signed)
 OUTPATIENT PHYSICAL THERAPY Wound Treatment    Patient Name: Patricia Wu MRN: [id number] DOB:[birth year], 68 y.o., female Today's Date: 07/21/2024   PCP: [facility] REFERRING PROVIDER: Hyler, Vernell DEL, NP  END OF SESSION:   PT End of Session - 07/21/24 1416     Visit Number 17    Number of Visits 24    Date for PT Re-Evaluation 08/13/24    Authorization Type [employer]    Progress Note Due on Visit 24    PT Start Time 1300    PT Stop Time 1320    PT Time Calculation (min) 20 min    Activity Tolerance Patient limited by pain    Behavior During Therapy [facility] for tasks assessed/performed            Past Medical History:  Diagnosis Date   CKD (chronic kidney disease)    Hypercholesteremia    Hypertension    Noncompliance with medication regimen    Past Surgical History:  Procedure Laterality Date   A/V FISTULAGRAM Left 08/15/2023   Procedure: A/V Fistulagram;  Surgeon: Gretta Lonni PARAS, MD;  [facility];  Service: Cardiovascular;  Laterality: Left;   A/V SHUNT INTERVENTION Left 07/08/2024   Procedure: A/V SHUNT INTERVENTION;  Surgeon: Pearline Norman RAMAN, MD;  Location: [facility];  Service: Cardiovascular;  Laterality: Left;   AV FISTULA PLACEMENT Left 06/18/2023   Procedure: INSERTION OF LEFT ARM ARTERIOVENOUS (AV) FISTULA;  Surgeon: Oris Krystal FALCON, MD;  Location: [facility];  Service: Vascular;  Laterality: Left;   COLONOSCOPY WITH PROPOFOL  N/A 10/17/2020   Procedure: COLONOSCOPY WITH PROPOFOL ;  Surgeon: Cindie Carlin POUR, DO;  [facility];  Service: Endoscopy;  Laterality: N/A;  1:00pm   IR FLUORO GUIDE CV LINE RIGHT  05/09/2023   IR REMOVAL TUN CV CATH W/O FL  01/28/2024   IR US  GUIDE VASC ACCESS RIGHT  05/10/2023   POLYPECTOMY  10/17/2020   Procedure: POLYPECTOMY;  Surgeon: Cindie Carlin POUR, DO;  [facility];  Service: Endoscopy;;   VENOUS ANGIOPLASTY  07/08/2024   Procedure: VENOUS ANGIOPLASTY;  Surgeon: Pearline Norman RAMAN, MD;  Location: [facility];  Service: Cardiovascular;;   Patient Active Problem List   Diagnosis Date Noted   Class 2 obesity due to excess calories with body mass index (BMI) of 39.0 to 39.9 in adult 05/10/2023   Acute on chronic combined systolic and diastolic CHF (congestive heart failure) (HCC) 05/09/2023   Hypervolemia associated with renal insufficiency 05/07/2023   ESRD needing dialysis (HCC) 05/07/2023   History of Coumadin  therapy 03/26/2023   Atrial fibrillation, controlled (HCC) 03/04/2023   Asthma, chronic 03/04/2023   Chronic kidney disease, stage IV (severe) (HCC) 02/18/2023   Anemia of chronic renal failure 02/18/2023   Acute-on-chronic kidney injury (HCC) 02/15/2022   Hyperlipidemia 02/15/2022   Obesity (BMI 30-39.9) 02/15/2022   Colon cancer screening 09/21/2020   Elevated alkaline phosphatase level 09/21/2020   Essential hypertension 02/18/2018   Elevated brain natriuretic peptide (BNP) level 02/18/2018   CKD (chronic kidney disease) stage 3, GFR 30-59 ml/min (HCC) 02/18/2018    ONSET DATE: 03/13/24  REFERRING DIAG: 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    Patient and Family Stated Goals wound  to heal, less pain    Date of Onset 03/13/24    Prior Treatments self care    Pain Scale 0-10    Pain Score 0-No pain   only has pain with touch/debridment   Evaluation and Treatment Procedures Explained to Patient/Family Yes    Evaluation and Treatment Procedures agreed to    Wound Properties Date First Assessed: 05/13/24 Time First Assessed: 1550 Location: Leg Location Orientation: Right;Medial Wound Description (Comments): black necrotic DO NOT USE: Wound Type: Other (Comment) DO NOT USE:  Present on Admission: Yes   Wound  Image Images linked: 2    Dressing Type Gauze (Comment)    Dressing Changed Changed    Dressing Status Old drainage    Dressing Change Frequency PRN    Site / Wound Assessment Red;Yellow    Peri-wound Assessment Edema;Other (Comment)   skin is black and perimeter around the wound is fibrous   Drainage Amount Minimal    Drainage Description Sanguineous;No odor    Treatment Cleansed;Debridement (Selective)    Selective Debridement (non-excisional) - Location wound base    Selective Debridement (non-excisional) - Tools Used Forceps;Scalpel;Scissors    Selective Debridement (non-excisional) - Tissue Removed slough    Wound Therapy - Clinical Statement see below    Wound Therapy - Functional Problem List difficult to bath, dress    Factors Delaying/Impairing Wound Healing Infection - systemic/local;Polypharmacy;Other (comment)    Hydrotherapy Plan Debridement;Dressing change;Patient/family education;Other (comment)    Wound Therapy - Frequency 2X / week    Wound Therapy - Current Recommendations PT    Wound Plan debridement and dressing change as needed    Dressing  vaseline to perimeter, medihoney on 2X2, medipore tape              PATIENT EDUCATION: Education details: keep dressing dry do not remove unless necessary  Person educated: Patient and Child(ren) Education method: Explanation Education comprehension: verbalized understanding   HOME EXERCISE PROGRAM: None    GOALS: Goals reviewed with patient? No  SHORT TERM GOALS: Target date: 05/27/24  Pt wound to be 80% granulated  Baseline: Goal status: MET  2.  PT pain to be decreased to no greater than a 5/10 Baseline:  Goal status: MET    LONG TERM GOALS: Target date: 06/11/23  Pt wound to be healed  Baseline:  Goal status: IN PROGRESS  2.  Pt pain to be no greater than a 0 in her Rt LE  Baseline:  Goal status: IN PROGRESS  3.  PT to have no difficulties washing or dressing due to wound  Baseline:  Goal  status: IN PROGRESS    ASSESSMENT:  CLINICAL IMPRESSION: Pt with near full closure of wound, only 2 small areas with depth remain.  Anticipate these will be healed next session.  Pt remains very sensitive to touch posterior LE but did tolerate debridement of dry tissue well.  Of note to have some bruising/redness proximal to wound that was also indurated.  PT denies any accidents or cause for this.  No signs/symptoms of infection.  Pt also with blotching/faint redness and slight induration posterior Rt LE.  Pt instructed to complete heelraises and was able to complete with cues appropriately.  Suspect sedentary lifestyle is contributing.  Used only gauze and medipore tape today as unsure if wrapping is irritating area.  Pt reported overall comfort.  No purulent drainage present today. Instructed to seek medical attention if worsens or develops symptoms of infection.   Pt will continue to benefit  from skill interventoin until wound is healed.   OBJECTIVE IMPAIRMENTS: pain and decreased skin integrity .    ACTIVITY LIMITATIONS: bathing and dressing   PERSONAL FACTORS: Fitness, Time since onset of injury/illness/exacerbation, and 1 comorbidity: CKI on dialysis are also affecting patient's functional outcome.   REHAB POTENTIAL: Fair    CLINICAL DECISION MAKING: Evolving/moderate complexity  EVALUATION COMPLEXITY: Moderate  PLAN: PT FREQUENCY: 2x/week  PT DURATION: 4 weeks (06/09/24:  continue for four more weeks)Pt to be seen for 2 more weeks. /7/24:  continue 2x a week x 4 weeks or until wound is healed  PLANNED INTERVENTIONS: 97535- Self Care and 02402- Wound care (first 20 sq cm)  PLAN FOR NEXT SESSION: Pt wound continuees to approximate, anticipate full closure and discharge next session; f/u with redness/bruising posterior LE next session    Greig KATHEE Fuse, PTA/CLT [facility] Ph: [phone]   07/21/2024, 2:25 PM

## 2024-07-21 NOTE — Progress Notes (Signed)
 Patient was identified as falling into the [place] Measure - Diabetes.   Patient was: Attribution and/or data issue.  Validation/Investigation needed.  Explanation:  reviewed chart and patient does not have a dx of diabetes listed as it states on the spreadsheet

## 2024-07-22 ENCOUNTER — Encounter: Payer: Self-pay | Admitting: Family Medicine

## 2024-07-22 ENCOUNTER — Ambulatory Visit (INDEPENDENT_AMBULATORY_CARE_PROVIDER_SITE_OTHER): Payer: No Typology Code available for payment source | Admitting: Family Medicine

## 2024-07-22 VITALS — BP 110/64 | HR 94 | Temp 98.4°F | Ht 64.0 in | Wt 186.6 lb

## 2024-07-22 DIAGNOSIS — Z992 Dependence on renal dialysis: Secondary | ICD-10-CM

## 2024-07-22 DIAGNOSIS — E785 Hyperlipidemia, unspecified: Secondary | ICD-10-CM | POA: Diagnosis not present

## 2024-07-22 DIAGNOSIS — N186 End stage renal disease: Secondary | ICD-10-CM

## 2024-07-22 DIAGNOSIS — Z0000 Encounter for general adult medical examination without abnormal findings: Secondary | ICD-10-CM

## 2024-07-22 DIAGNOSIS — I4891 Unspecified atrial fibrillation: Secondary | ICD-10-CM

## 2024-07-22 MED ORDER — ATORVASTATIN CALCIUM 10 MG PO TABS: ORAL_TABLET | ORAL | 2 refills | Status: DC

## 2024-07-22 NOTE — Patient Instructions (Addendum)
 I recommend flu and covid booster in the fall.  It appears you are due for second shingles vaccine - can be given at pharmacy.  I will complete paperwork for handicap placard. Keep follow up as planned with your other medical providers. Hang in there!  Health Maintenance After Age 68 After age 92, you are at a higher risk for certain long-term diseases and infections as well as injuries from falls. Falls are a major cause of broken bones and head injuries in people who are older than age 61. Getting regular preventive care can help to keep you healthy and well. Preventive care includes getting regular testing and making lifestyle changes as recommended by your health care provider. Talk with your health care provider about: Which screenings and tests you should have. A screening is a test that checks for a disease when you have no symptoms. A diet and exercise plan that is right for you. What should I know about screenings and tests to prevent falls? Screening and testing are the best ways to find a health problem early. Early diagnosis and treatment give you the best chance of managing medical conditions that are common after age 65. Certain conditions and lifestyle choices may make you more likely to have a fall. Your health care provider may recommend: Regular vision checks. Poor vision and conditions such as cataracts can make you more likely to have a fall. If you wear glasses, make sure to get your prescription updated if your vision changes. Medicine review. Work with your health care provider to regularly review all of the medicines you are taking, including over-the-counter medicines. Ask your health care provider about any side effects that may make you more likely to have a fall. Tell your health care provider if any medicines that you take make you feel dizzy or sleepy. Strength and balance checks. Your health care provider may recommend certain tests to check your strength and balance while  standing, walking, or changing positions. Foot health exam. Foot pain and numbness, as well as not wearing proper footwear, can make you more likely to have a fall. Screenings, including: Osteoporosis screening. Osteoporosis is a condition that causes the bones to get weaker and break more easily. Blood pressure screening. Blood pressure changes and medicines to control blood pressure can make you feel dizzy. Depression screening. You may be more likely to have a fall if you have a fear of falling, feel depressed, or feel unable to do activities that you used to do. Alcohol use screening. Using too much alcohol can affect your balance and may make you more likely to have a fall. Follow these instructions at home: Lifestyle Do not drink alcohol if: Your health care provider tells you not to drink. If you drink alcohol: Limit how much you have to: 0-1 drink a day for women. 0-2 drinks a day for men. Know how much alcohol is in your drink. In the [place] one drink equals one 12 oz bottle of beer (355 mL), one 5 oz glass of wine (148 mL), or one 1 oz glass of hard liquor (44 mL). Do not use any products that contain nicotine or tobacco. These products include cigarettes, chewing tobacco, and vaping devices, such as e-cigarettes. If you need help quitting, ask your health care provider. Activity  Follow a regular exercise program to stay fit. This will help you maintain your balance. Ask your health care provider what types of exercise are appropriate for you. If you need a cane or  walker, use it as recommended by your health care provider. Wear supportive shoes that have nonskid soles. Safety  Remove any tripping hazards, such as rugs, cords, and clutter. Install safety equipment such as grab bars in bathrooms and safety rails on stairs. Keep rooms and walkways well-lit. General instructions Talk with your health care provider about your risks for falling. Tell your health care provider  if: You fall. Be sure to tell your health care provider about all falls, even ones that seem minor. You feel dizzy, tiredness (fatigue), or off-balance. Take over-the-counter and prescription medicines only as told by your health care provider. These include supplements. Eat a healthy diet and maintain a healthy weight. A healthy diet includes low-fat dairy products, low-fat (lean) meats, and fiber from whole grains, beans, and lots of fruits and vegetables. Stay current with your vaccines. Schedule regular health, dental, and eye exams. Summary Having a healthy lifestyle and getting preventive care can help to protect your health and wellness after age 31. Screening and testing are the best way to find a health problem early and help you avoid having a fall. Early diagnosis and treatment give you the best chance for managing medical conditions that are more common for people who are older than age 44. Falls are a major cause of broken bones and head injuries in people who are older than age 38. Take precautions to prevent a fall at home. Work with your health care provider to learn what changes you can make to improve your health and wellness and to prevent falls. This information is not intended to replace advice given to you by your health care provider. Make sure you discuss any questions you have with your health care provider. Document Revised: 05/01/2021 Document Reviewed: 05/01/2021 Elsevier Patient Education  2024 [employer].

## 2024-07-22 NOTE — Progress Notes (Signed)
 Subjective:  Patient ID: Patricia Wu, female    DOB: [birth year]  Age: 68 y.o. MRN: [id number]  CC:  Chief Complaint  Patient presents with   Annual Exam    Patient has disability placard to be filled out that has never been filled out here.     HPI Patricia Wu presents for Annual Exam  PCP, me Nephrology, Dr. Dennise, end-stage renal disease on hemodialysis. MWF - fatigue today after HD.  Vascular surgeon, Dr. Sheree, AV shunt fistulogram on July 16.  Balloon angioplasty of left arm cephalic arch stenosis. Cardiology, Dr. Debera, Almarie Crate NP -  atrial fibrillation, heart failure, with EF 40 to 45% in 2024, NYHA class I-II.  on beta-blocker with metoprolol , Coumadin  for anticoagulation, followed by anticoag clinic.  INR 2.7 on July 23.  On Lipitor 10 mg daily for hyperlipidemia. No new myalgia/side effects.  Furosemide  as needed. Irbesartan . Metoprolol  for HTN.   Request handicap placard - fatigue with HD. Trouble walking distances. Occasionally uses cane.   Allergic rhinitis with bronchospasm Treated with Flonase  as needed for allergies, albuterol  if needed for bronchospasm.  Infrequent use. No recent for either one.      07/22/2024    2:20 PM 12/03/2023   10:19 AM 07/18/2023    1:40 PM 04/03/2023   11:33 AM 01/16/2023   10:57 AM  Depression screen PHQ 2/9  Decreased Interest 0 1 1 1  0  Down, Depressed, Hopeless 0 0 1 1 0  PHQ - 2 Score 0 1 2 2  0  Altered sleeping  0 0 2 0  Tired, decreased energy  1 1 2  0  Change in appetite  1 1 1  0  Feeling bad or failure about yourself   0 0 1 0  Trouble concentrating  0 0 0 0  Moving slowly or fidgety/restless  0 0 0 0  Suicidal thoughts  0 0 0 0  PHQ-9 Score  3 4 8  0  Difficult doing work/chores  Not difficult at all  Somewhat difficult     Health Maintenance  Topic Date Due   COVID-19 Vaccine (2 - 2024-25 season) 08/07/2024 (Originally 08/25/2023)   Zoster Vaccines- Shingrix  (2 of 2) 10/22/2024 (Originally 12/21/2021)    Medicare Annual Wellness (AWV)  12/02/2024   MAMMOGRAM  03/26/2025   DTaP/Tdap/Td (2 - Td or Tdap) 02/02/2028   Colonoscopy  10/17/2030   Pneumococcal Vaccine: 50+ Years  Completed   DEXA SCAN  Completed   Hepatitis C Screening  Completed   Hepatitis B Vaccines  Aged Out   HPV VACCINES  Aged Out   Meningococcal B Vaccine  Aged Out   INFLUENZA VACCINE  Discontinued  Colonoscopy in 2021. Dr. Cindie, repeat 5 years Mammogram 03/27/24.   Immunization History  Administered Date(s) Administered   Influenza,inj,Quad PF,6+ Mos 02/01/2018   PNEUMOCOCCAL CONJUGATE-20 02/14/2022   Tdap 02/01/2018   Zoster Recombinant(Shingrix ) 10/26/2021   Zoster, Live 01/07/2017  Second shingrix  recommended at pharmacy.   No results found. Optho - recommended.   Dental: due for dentist. No new issues.   Alcohol: none  Tobacco: none  Exercise: at physical therapy  - few days per week.    History Patient Active Problem List   Diagnosis Date Noted   Class 2 obesity due to excess calories with body mass index (BMI) of 39.0 to 39.9 in adult 05/10/2023   Acute on chronic combined systolic and diastolic CHF (congestive heart failure) (HCC) 05/09/2023   Hypervolemia associated with renal  insufficiency 05/07/2023   ESRD needing dialysis (HCC) 05/07/2023   History of Coumadin  therapy 03/26/2023   Atrial fibrillation, controlled (HCC) 03/04/2023   Asthma, chronic 03/04/2023   Chronic kidney disease, stage IV (severe) (HCC) 02/18/2023   Anemia of chronic renal failure 02/18/2023   Acute-on-chronic kidney injury (HCC) 02/15/2022   Hyperlipidemia 02/15/2022   Obesity (BMI 30-39.9) 02/15/2022   Colon cancer screening 09/21/2020   Elevated alkaline phosphatase level 09/21/2020   Essential hypertension 02/18/2018   Elevated brain natriuretic peptide (BNP) level 02/18/2018   CKD (chronic kidney disease) stage 3, GFR 30-59 ml/min (HCC) 02/18/2018   Past Medical History:  Diagnosis Date   CKD (chronic  kidney disease)    Hypercholesteremia    Hypertension    Noncompliance with medication regimen    Past Surgical History:  Procedure Laterality Date   A/V FISTULAGRAM Left 08/15/2023   Procedure: A/V Fistulagram;  Surgeon: Gretta Lonni PARAS, MD;  Location: [facility];  Service: Cardiovascular;  Laterality: Left;   A/V SHUNT INTERVENTION Left 07/08/2024   Procedure: A/V SHUNT INTERVENTION;  Surgeon: Pearline Norman RAMAN, MD;  Location: [facility];  Service: Cardiovascular;  Laterality: Left;   AV FISTULA PLACEMENT Left 06/18/2023   Procedure: INSERTION OF LEFT ARM ARTERIOVENOUS (AV) FISTULA;  Surgeon: Oris Krystal FALCON, MD;  Location: [facility];  Service: Vascular;  Laterality: Left;   COLONOSCOPY WITH PROPOFOL  N/A 10/17/2020   Procedure: COLONOSCOPY WITH PROPOFOL ;  Surgeon: Cindie Carlin POUR, DO;  Location: [facility];  Service: Endoscopy;  Laterality: N/A;  1:00pm   IR FLUORO GUIDE CV LINE RIGHT  05/09/2023   IR REMOVAL TUN CV CATH W/O FL  01/28/2024   IR US  GUIDE VASC ACCESS RIGHT  05/10/2023   POLYPECTOMY  10/17/2020   Procedure: POLYPECTOMY;  Surgeon: Cindie Carlin POUR, DO;  Location: [facility];  Service: Endoscopy;;   VENOUS ANGIOPLASTY  07/08/2024   Procedure: VENOUS ANGIOPLASTY;  Surgeon: Pearline Norman RAMAN, MD;  Location: [facility];  Service: Cardiovascular;;   Allergies  Allergen Reactions   Ibuprofen Other (See Comments)    pcp said raises her BP   Prior to Admission medications   Medication Sig Start Date End Date Taking? Authorizing Provider  acetaminophen  (TYLENOL ) 325 MG tablet Take 325-650 mg by mouth every 6 (six) hours as needed (pain.).    Yes [provider]  albuterol  (VENTOLIN  HFA) 108 (90 Base) MCG/ACT inhaler Inhale 1-2 puffs into the lungs every 6 (six) hours as needed for wheezing or shortness of breath. 07/18/23  Yes Levora Patricia SAUNDERS, MD  atorvastatin  (LIPITOR) 10 MG tablet TAKE 1 TABLET(10 MG) BY MOUTH DAILY 06/05/24  Yes Levora Patricia SAUNDERS, MD  B  Complex-C-Folic Acid  (RENA-VITE RX) 1 MG TABS Take 1 tablet by mouth daily. 08/29/23  Yes [provider]  calcitRIOL  (ROCALTROL ) 0.25 MCG capsule Take 0.5 mcg by mouth daily.   Yes [provider]  calcium  acetate (PHOSLO) 667 MG capsule Take 1,334 mg by mouth 3 (three) times daily. 09/25/23  Yes [provider]  furosemide  (LASIX ) 40 MG tablet Take 1 tablet (40 mg total) by mouth as needed for edema (swelling). 08/05/23  Yes Miriam Norris, NP  irbesartan  (AVAPRO ) 150 MG tablet Take 1 tablet (150 mg total) by mouth daily. 01/02/24  Yes Miriam Norris, NP  metoprolol  tartrate (LOPRESSOR ) 100 MG tablet Take 1 tablet (100 mg total) by mouth 2 (two) times daily. 01/02/24  Yes Miriam Norris, NP  metoprolol  tartrate (LOPRESSOR ) 25  MG tablet Take 1 tablet (25 mg total) by mouth 2 (two) times daily. 01/02/24  Yes Miriam Norris, NP  oxyCODONE -acetaminophen  (PERCOCET) 5-325 MG tablet Take 1 tablet by mouth every 6 (six) hours as needed for severe pain. 06/18/23  Yes Early, Krystal FALCON, MD  warfarin (COUMADIN ) 2.5 MG tablet TAKE 2 TABLETS TO 3 TABLETS BY MOUTH DAILY AS DIRECTED BY COUMADIN  CLINIC 07/02/24  Yes Mallipeddi, Vishnu P, MD  triamcinolone  cream (KENALOG ) 0.1 % Apply 1 Application topically 2 (two) times daily. Patient not taking: Reported on 07/22/2024 01/22/24   Levora Patricia SAUNDERS, MD   Social History   Socioeconomic History   Marital status: Married    Spouse name: Not on file   Number of children: 2   Years of education: Not on file   Highest education level: Not on file  Occupational History   Not on file  Tobacco Use   Smoking status: Never    Passive exposure: Never   Smokeless tobacco: Never  Vaping Use   Vaping status: Never Used  Substance and Sexual Activity   Alcohol use: No   Drug use: No   Sexual activity: Yes  Other Topics Concern   Not on file  Social History Narrative   Not on file   Social Drivers of Health   Financial Resource Strain: Low Risk   (12/03/2023)   Overall Financial Resource Strain (CARDIA)    Difficulty of Paying Living Expenses: Not hard at all  Food Insecurity: No Food Insecurity (12/03/2023)   Hunger Vital Sign    Worried About Running Out of Food in the Last Year: Never true    Ran Out of Food in the Last Year: Never true  Transportation Needs: No Transportation Needs (12/03/2023)   PRAPARE - Administrator, Civil Service (Medical): No    Lack of Transportation (Non-Medical): No  Physical Activity: Insufficiently Active (12/03/2023)   Exercise Vital Sign    Days of Exercise per Week: 2 days    Minutes of Exercise per Session: 30 min  Stress: No Stress Concern Present (12/03/2023)   [employer] of Occupational Health - Occupational Stress Questionnaire    Feeling of Stress : Not at all  Social Connections: Moderately Integrated (12/03/2023)   Social Connection and Isolation Panel    Frequency of Communication with Friends and Family: Three times a week    Frequency of Social Gatherings with Friends and Family: Three times a week    Attends Religious Services: More than 4 times per year    Active Member of Clubs or Organizations: No    Attends Banker Meetings: Never    Marital Status: Married  Catering manager Violence: Not At Risk (12/03/2023)   Humiliation, Afraid, Rape, and Kick questionnaire    Fear of Current or Ex-Partner: No    Emotionally Abused: No    Physically Abused: No    Sexually Abused: No    Review of Systems Per HPI. 13 point review of systems per patient health survey noted.  Negative other than as indicated above or in HPI.    Objective:   Vitals:   07/22/24 1425  BP: 110/64  Pulse: 94  Temp: 98.4 F (36.9 C)  TempSrc: Oral  SpO2: 97%  Weight: 186 lb 9.6 oz (84.6 kg)  Height: 5' 4 (1.626 m)     Physical Exam Vitals reviewed.  Constitutional:      Appearance: She is well-developed.  HENT:     Head: Normocephalic  and atraumatic.      Right Ear: External ear normal.     Left Ear: External ear normal.  Eyes:     Conjunctiva/sclera: Conjunctivae normal.     Pupils: Pupils are equal, round, and reactive to light.  Neck:     Thyroid : No thyromegaly.  Cardiovascular:     Rate and Rhythm: Normal rate and regular rhythm.     Heart sounds: Normal heart sounds. No murmur heard. Pulmonary:     Effort: Pulmonary effort is normal. No respiratory distress.     Breath sounds: Normal breath sounds. No wheezing.  Abdominal:     General: Bowel sounds are normal.     Palpations: Abdomen is soft.     Tenderness: There is no abdominal tenderness.  Musculoskeletal:        General: No tenderness. Normal range of motion.     Cervical back: Normal range of motion and neck supple.  Lymphadenopathy:     Cervical: No cervical adenopathy.  Skin:    General: Skin is warm and dry.     Findings: No rash.  Neurological:     Mental Status: She is alert and oriented to person, place, and time.  Psychiatric:        Behavior: Behavior normal.        Thought Content: Thought content normal.        Assessment & Plan:  Patricia Wu is a 68 y.o. female . Annual physical exam  - -anticipatory guidance as below in AVS, screening labs above. Health maintenance items as above in HPI discussed/recommended as applicable.   ESRD on hemodialysis (HCC) - 3 days/week, fatigue after hemodialysis, limits her exertion, handicap placard completed.  Continue follow-up with nephrology, ongoing lab monitoring with nephrology and hemodialysis.  Hyperlipidemia, unspecified hyperlipidemia type - Plan: Lipid panel, Hepatic function panel, atorvastatin  (LIPITOR) 10 MG tablet  - Tolerating current med regimen, continue Lipitor, check hepatic function and lipid panel and adjust plan accordingly.  Atrial fibrillation, controlled (HCC)  - Rate control, anticoagulation as above with cardiology follow-up, Coumadin  clinic/anticoagulation clinic follow-up.  No  changes.  Meds ordered this encounter  Medications   atorvastatin  (LIPITOR) 10 MG tablet    Sig: TAKE 1 TABLET(10 MG) BY MOUTH DAILY    Dispense:  90 tablet    Refill:  2   Patient Instructions  I recommend flu and covid booster in the fall.  It appears you are due for second shingles vaccine - can be given at pharmacy.  I will complete paperwork for handicap placard. Keep follow up as planned with your other medical providers. Hang in there!  Health Maintenance After Age 80 After age 3, you are at a higher risk for certain long-term diseases and infections as well as injuries from falls. Falls are a major cause of broken bones and head injuries in people who are older than age 10. Getting regular preventive care can help to keep you healthy and well. Preventive care includes getting regular testing and making lifestyle changes as recommended by your health care provider. Talk with your health care provider about: Which screenings and tests you should have. A screening is a test that checks for a disease when you have no symptoms. A diet and exercise plan that is right for you. What should I know about screenings and tests to prevent falls? Screening and testing are the best ways to find a health problem early. Early diagnosis and treatment give you the best chance of managing  medical conditions that are common after age 72. Certain conditions and lifestyle choices may make you more likely to have a fall. Your health care provider may recommend: Regular vision checks. Poor vision and conditions such as cataracts can make you more likely to have a fall. If you wear glasses, make sure to get your prescription updated if your vision changes. Medicine review. Work with your health care provider to regularly review all of the medicines you are taking, including over-the-counter medicines. Ask your health care provider about any side effects that may make you more likely to have a fall. Tell your  health care provider if any medicines that you take make you feel dizzy or sleepy. Strength and balance checks. Your health care provider may recommend certain tests to check your strength and balance while standing, walking, or changing positions. Foot health exam. Foot pain and numbness, as well as not wearing proper footwear, can make you more likely to have a fall. Screenings, including: Osteoporosis screening. Osteoporosis is a condition that causes the bones to get weaker and break more easily. Blood pressure screening. Blood pressure changes and medicines to control blood pressure can make you feel dizzy. Depression screening. You may be more likely to have a fall if you have a fear of falling, feel depressed, or feel unable to do activities that you used to do. Alcohol use screening. Using too much alcohol can affect your balance and may make you more likely to have a fall. Follow these instructions at home: Lifestyle Do not drink alcohol if: Your health care provider tells you not to drink. If you drink alcohol: Limit how much you have to: 0-1 drink a day for women. 0-2 drinks a day for men. Know how much alcohol is in your drink. In the [place] one drink equals one 12 oz bottle of beer (355 mL), one 5 oz glass of wine (148 mL), or one 1 oz glass of hard liquor (44 mL). Do not use any products that contain nicotine or tobacco. These products include cigarettes, chewing tobacco, and vaping devices, such as e-cigarettes. If you need help quitting, ask your health care provider. Activity  Follow a regular exercise program to stay fit. This will help you maintain your balance. Ask your health care provider what types of exercise are appropriate for you. If you need a cane or walker, use it as recommended by your health care provider. Wear supportive shoes that have nonskid soles. Safety  Remove any tripping hazards, such as rugs, cords, and clutter. Install safety equipment such as grab  bars in bathrooms and safety rails on stairs. Keep rooms and walkways well-lit. General instructions Talk with your health care provider about your risks for falling. Tell your health care provider if: You fall. Be sure to tell your health care provider about all falls, even ones that seem minor. You feel dizzy, tiredness (fatigue), or off-balance. Take over-the-counter and prescription medicines only as told by your health care provider. These include supplements. Eat a healthy diet and maintain a healthy weight. A healthy diet includes low-fat dairy products, low-fat (lean) meats, and fiber from whole grains, beans, and lots of fruits and vegetables. Stay current with your vaccines. Schedule regular health, dental, and eye exams. Summary Having a healthy lifestyle and getting preventive care can help to protect your health and wellness after age 37. Screening and testing are the best way to find a health problem early and help you avoid having a fall. Early diagnosis and  treatment give you the best chance for managing medical conditions that are more common for people who are older than age 57. Falls are a major cause of broken bones and head injuries in people who are older than age 71. Take precautions to prevent a fall at home. Work with your health care provider to learn what changes you can make to improve your health and wellness and to prevent falls. This information is not intended to replace advice given to you by your health care provider. Make sure you discuss any questions you have with your health care provider. Document Revised: 05/01/2021 Document Reviewed: 05/01/2021 Elsevier Patient Education  2024 Elsevier Inc.    Signed,   Patricia Pines, MD [facility], [facility] 07/22/24 3:14 PM

## 2024-07-23 DIAGNOSIS — N186 End stage renal disease: Secondary | ICD-10-CM | POA: Diagnosis not present

## 2024-07-23 DIAGNOSIS — Z992 Dependence on renal dialysis: Secondary | ICD-10-CM | POA: Diagnosis not present

## 2024-07-23 LAB — LIPID PANEL
Cholesterol: 183 mg/dL (ref 0–200)
HDL: 56 mg/dL (ref >39.00)
LDL Cholesterol: 108 mg/dL — ABNORMAL HIGH (ref 0–99)
NonHDL: 126.94
Total CHOL/HDL Ratio: 3
Triglycerides: 96 mg/dL (ref 0.0–149.0)
VLDL: 19.2 mg/dL (ref 0.0–40.0)

## 2024-07-23 LAB — HEPATIC FUNCTION PANEL
ALT: 19 U/L (ref 0–35)
AST: 24 U/L (ref 0–37)
Albumin: 4.1 g/dL (ref 3.5–5.2)
Alkaline Phosphatase: 557 U/L — ABNORMAL HIGH (ref 39–117)
Bilirubin, Direct: 0.3 mg/dL (ref 0.0–0.3)
Total Bilirubin: 0.8 mg/dL (ref 0.2–1.2)
Total Protein: 8.3 g/dL (ref 6.0–8.3)

## 2024-07-24 ENCOUNTER — Ambulatory Visit: Payer: Self-pay | Admitting: Family Medicine

## 2024-07-24 DIAGNOSIS — N186 End stage renal disease: Secondary | ICD-10-CM | POA: Diagnosis not present

## 2024-07-24 DIAGNOSIS — Z992 Dependence on renal dialysis: Secondary | ICD-10-CM | POA: Diagnosis not present

## 2024-07-24 DIAGNOSIS — D631 Anemia in chronic kidney disease: Secondary | ICD-10-CM | POA: Diagnosis not present

## 2024-07-24 DIAGNOSIS — N2581 Secondary hyperparathyroidism of renal origin: Secondary | ICD-10-CM | POA: Diagnosis not present

## 2024-07-27 ENCOUNTER — Ambulatory Visit (HOSPITAL_COMMUNITY): Admitting: Physical Therapy

## 2024-07-27 ENCOUNTER — Emergency Department (HOSPITAL_COMMUNITY)

## 2024-07-27 ENCOUNTER — Telehealth (HOSPITAL_COMMUNITY): Payer: Self-pay | Admitting: Physical Therapy

## 2024-07-27 ENCOUNTER — Encounter (HOSPITAL_COMMUNITY): Payer: Self-pay | Admitting: *Deleted

## 2024-07-27 ENCOUNTER — Inpatient Hospital Stay (HOSPITAL_COMMUNITY)

## 2024-07-27 ENCOUNTER — Inpatient Hospital Stay (HOSPITAL_COMMUNITY)
Admission: EM | Admit: 2024-07-27 | Discharge: 2024-08-24 | DRG: 064 | Disposition: E | Attending: Neurology | Admitting: Neurology

## 2024-07-27 ENCOUNTER — Other Ambulatory Visit: Payer: Self-pay

## 2024-07-27 DIAGNOSIS — Z4682 Encounter for fitting and adjustment of non-vascular catheter: Secondary | ICD-10-CM | POA: Diagnosis not present

## 2024-07-27 DIAGNOSIS — T45515A Adverse effect of anticoagulants, initial encounter: Secondary | ICD-10-CM | POA: Diagnosis not present

## 2024-07-27 DIAGNOSIS — D631 Anemia in chronic kidney disease: Secondary | ICD-10-CM | POA: Diagnosis not present

## 2024-07-27 DIAGNOSIS — R410 Disorientation, unspecified: Secondary | ICD-10-CM | POA: Diagnosis not present

## 2024-07-27 DIAGNOSIS — R4701 Aphasia: Secondary | ICD-10-CM | POA: Diagnosis present

## 2024-07-27 DIAGNOSIS — I120 Hypertensive chronic kidney disease with stage 5 chronic kidney disease or end stage renal disease: Secondary | ICD-10-CM | POA: Diagnosis not present

## 2024-07-27 DIAGNOSIS — I10 Essential (primary) hypertension: Secondary | ICD-10-CM | POA: Diagnosis not present

## 2024-07-27 DIAGNOSIS — R000 Tachycardia, unspecified: Secondary | ICD-10-CM | POA: Diagnosis not present

## 2024-07-27 DIAGNOSIS — E7800 Pure hypercholesterolemia, unspecified: Secondary | ICD-10-CM | POA: Diagnosis present

## 2024-07-27 DIAGNOSIS — I69391 Dysphagia following cerebral infarction: Secondary | ICD-10-CM | POA: Diagnosis not present

## 2024-07-27 DIAGNOSIS — I6201 Nontraumatic acute subdural hemorrhage: Secondary | ICD-10-CM | POA: Diagnosis not present

## 2024-07-27 DIAGNOSIS — R791 Abnormal coagulation profile: Secondary | ICD-10-CM | POA: Diagnosis present

## 2024-07-27 DIAGNOSIS — I629 Nontraumatic intracranial hemorrhage, unspecified: Secondary | ICD-10-CM | POA: Diagnosis not present

## 2024-07-27 DIAGNOSIS — I619 Nontraumatic intracerebral hemorrhage, unspecified: Secondary | ICD-10-CM | POA: Diagnosis present

## 2024-07-27 DIAGNOSIS — Z91148 Patient's other noncompliance with medication regimen for other reason: Secondary | ICD-10-CM

## 2024-07-27 DIAGNOSIS — I611 Nontraumatic intracerebral hemorrhage in hemisphere, cortical: Secondary | ICD-10-CM | POA: Diagnosis not present

## 2024-07-27 DIAGNOSIS — J9600 Acute respiratory failure, unspecified whether with hypoxia or hypercapnia: Secondary | ICD-10-CM | POA: Diagnosis present

## 2024-07-27 DIAGNOSIS — R29818 Other symptoms and signs involving the nervous system: Secondary | ICD-10-CM | POA: Diagnosis not present

## 2024-07-27 DIAGNOSIS — I482 Chronic atrial fibrillation, unspecified: Secondary | ICD-10-CM | POA: Diagnosis not present

## 2024-07-27 DIAGNOSIS — G40901 Epilepsy, unspecified, not intractable, with status epilepticus: Secondary | ICD-10-CM | POA: Diagnosis not present

## 2024-07-27 DIAGNOSIS — I472 Ventricular tachycardia, unspecified: Secondary | ICD-10-CM | POA: Diagnosis not present

## 2024-07-27 DIAGNOSIS — R29810 Facial weakness: Secondary | ICD-10-CM | POA: Diagnosis present

## 2024-07-27 DIAGNOSIS — G936 Cerebral edema: Secondary | ICD-10-CM | POA: Diagnosis present

## 2024-07-27 DIAGNOSIS — Z66 Do not resuscitate: Secondary | ICD-10-CM | POA: Diagnosis not present

## 2024-07-27 DIAGNOSIS — R1310 Dysphagia, unspecified: Secondary | ICD-10-CM | POA: Diagnosis present

## 2024-07-27 DIAGNOSIS — D649 Anemia, unspecified: Secondary | ICD-10-CM | POA: Diagnosis not present

## 2024-07-27 DIAGNOSIS — I480 Paroxysmal atrial fibrillation: Secondary | ICD-10-CM | POA: Diagnosis present

## 2024-07-27 DIAGNOSIS — Z452 Encounter for adjustment and management of vascular access device: Secondary | ICD-10-CM | POA: Diagnosis not present

## 2024-07-27 DIAGNOSIS — M7989 Other specified soft tissue disorders: Secondary | ICD-10-CM | POA: Diagnosis not present

## 2024-07-27 DIAGNOSIS — R29738 NIHSS score 38: Secondary | ICD-10-CM | POA: Diagnosis not present

## 2024-07-27 DIAGNOSIS — Z515 Encounter for palliative care: Secondary | ICD-10-CM | POA: Diagnosis not present

## 2024-07-27 DIAGNOSIS — I1311 Hypertensive heart and chronic kidney disease without heart failure, with stage 5 chronic kidney disease, or end stage renal disease: Secondary | ICD-10-CM

## 2024-07-27 DIAGNOSIS — I4891 Unspecified atrial fibrillation: Secondary | ICD-10-CM | POA: Diagnosis not present

## 2024-07-27 DIAGNOSIS — R29720 NIHSS score 20: Secondary | ICD-10-CM | POA: Diagnosis not present

## 2024-07-27 DIAGNOSIS — J811 Chronic pulmonary edema: Secondary | ICD-10-CM | POA: Diagnosis not present

## 2024-07-27 DIAGNOSIS — E669 Obesity, unspecified: Secondary | ICD-10-CM | POA: Diagnosis not present

## 2024-07-27 DIAGNOSIS — Z992 Dependence on renal dialysis: Secondary | ICD-10-CM

## 2024-07-27 DIAGNOSIS — R0689 Other abnormalities of breathing: Secondary | ICD-10-CM | POA: Diagnosis not present

## 2024-07-27 DIAGNOSIS — E871 Hypo-osmolality and hyponatremia: Secondary | ICD-10-CM | POA: Diagnosis not present

## 2024-07-27 DIAGNOSIS — I2510 Atherosclerotic heart disease of native coronary artery without angina pectoris: Secondary | ICD-10-CM | POA: Diagnosis present

## 2024-07-27 DIAGNOSIS — N186 End stage renal disease: Secondary | ICD-10-CM | POA: Diagnosis not present

## 2024-07-27 DIAGNOSIS — E785 Hyperlipidemia, unspecified: Secondary | ICD-10-CM | POA: Diagnosis not present

## 2024-07-27 DIAGNOSIS — R0989 Other specified symptoms and signs involving the circulatory and respiratory systems: Secondary | ICD-10-CM | POA: Diagnosis not present

## 2024-07-27 DIAGNOSIS — L03115 Cellulitis of right lower limb: Secondary | ICD-10-CM | POA: Diagnosis not present

## 2024-07-27 DIAGNOSIS — G935 Compression of brain: Secondary | ICD-10-CM | POA: Diagnosis not present

## 2024-07-27 DIAGNOSIS — G4089 Other seizures: Secondary | ICD-10-CM | POA: Diagnosis not present

## 2024-07-27 DIAGNOSIS — R4182 Altered mental status, unspecified: Secondary | ICD-10-CM | POA: Diagnosis not present

## 2024-07-27 DIAGNOSIS — I161 Hypertensive emergency: Secondary | ICD-10-CM | POA: Diagnosis present

## 2024-07-27 DIAGNOSIS — Z6833 Body mass index (BMI) 33.0-33.9, adult: Secondary | ICD-10-CM

## 2024-07-27 DIAGNOSIS — G9389 Other specified disorders of brain: Secondary | ICD-10-CM | POA: Diagnosis not present

## 2024-07-27 DIAGNOSIS — I639 Cerebral infarction, unspecified: Secondary | ICD-10-CM | POA: Diagnosis not present

## 2024-07-27 DIAGNOSIS — R29726 NIHSS score 26: Secondary | ICD-10-CM | POA: Diagnosis not present

## 2024-07-27 DIAGNOSIS — I517 Cardiomegaly: Secondary | ICD-10-CM | POA: Diagnosis not present

## 2024-07-27 DIAGNOSIS — Z7189 Other specified counseling: Secondary | ICD-10-CM | POA: Diagnosis not present

## 2024-07-27 DIAGNOSIS — I700 Atherosclerosis of aorta: Secondary | ICD-10-CM | POA: Diagnosis not present

## 2024-07-27 DIAGNOSIS — R531 Weakness: Secondary | ICD-10-CM | POA: Diagnosis not present

## 2024-07-27 DIAGNOSIS — Z7401 Bed confinement status: Secondary | ICD-10-CM | POA: Diagnosis not present

## 2024-07-27 DIAGNOSIS — I618 Other nontraumatic intracerebral hemorrhage: Secondary | ICD-10-CM | POA: Diagnosis not present

## 2024-07-27 DIAGNOSIS — I609 Nontraumatic subarachnoid hemorrhage, unspecified: Secondary | ICD-10-CM | POA: Diagnosis not present

## 2024-07-27 DIAGNOSIS — S06310A Contusion and laceration of right cerebrum without loss of consciousness, initial encounter: Secondary | ICD-10-CM | POA: Diagnosis not present

## 2024-07-27 DIAGNOSIS — I959 Hypotension, unspecified: Secondary | ICD-10-CM | POA: Diagnosis present

## 2024-07-27 DIAGNOSIS — Z8673 Personal history of transient ischemic attack (TIA), and cerebral infarction without residual deficits: Secondary | ICD-10-CM

## 2024-07-27 DIAGNOSIS — Z7901 Long term (current) use of anticoagulants: Secondary | ICD-10-CM

## 2024-07-27 DIAGNOSIS — Z79899 Other long term (current) drug therapy: Secondary | ICD-10-CM

## 2024-07-27 HISTORY — DX: Unspecified atrial fibrillation: I48.91

## 2024-07-27 HISTORY — DX: End stage renal disease: N18.6

## 2024-07-27 LAB — POCT I-STAT 7, (LYTES, BLD GAS, ICA,H+H)
Acid-base deficit: 2 mmol/L (ref 0.0–2.0)
Bicarbonate: 25.5 mmol/L (ref 20.0–28.0)
Calcium, Ion: 1.04 mmol/L — ABNORMAL LOW (ref 1.15–1.40)
HCT: 33 % — ABNORMAL LOW (ref 36.0–46.0)
Hemoglobin: 11.2 g/dL — ABNORMAL LOW (ref 12.0–15.0)
O2 Saturation: 96 %
Patient temperature: 99.6
Potassium: 4 mmol/L (ref 3.5–5.1)
Sodium: 140 mmol/L (ref 135–145)
TCO2: 27 mmol/L (ref 22–32)
pCO2 arterial: 55.8 mmHg — ABNORMAL HIGH (ref 32–48)
pH, Arterial: 7.27 — ABNORMAL LOW (ref 7.35–7.45)
pO2, Arterial: 95 mmHg (ref 83–108)

## 2024-07-27 LAB — LACTIC ACID, PLASMA: Lactic Acid, Venous: 1.9 mmol/L (ref 0.5–1.9)

## 2024-07-27 LAB — COMPREHENSIVE METABOLIC PANEL WITH GFR
ALT: 22 U/L (ref 0–44)
AST: 21 U/L (ref 15–41)
Albumin: 3.3 g/dL — ABNORMAL LOW (ref 3.5–5.0)
Alkaline Phosphatase: 514 U/L — ABNORMAL HIGH (ref 38–126)
Anion gap: 20 — ABNORMAL HIGH (ref 5–15)
BUN: 17 mg/dL (ref 8–23)
CO2: 27 mmol/L (ref 22–32)
Calcium: 9.2 mg/dL (ref 8.9–10.3)
Chloride: 95 mmol/L — ABNORMAL LOW (ref 98–111)
Creatinine, Ser: 3.98 mg/dL — ABNORMAL HIGH (ref 0.44–1.00)
GFR, Estimated: 12 mL/min — ABNORMAL LOW (ref >60)
Glucose, Bld: 93 mg/dL (ref 70–99)
Potassium: 3 mmol/L — ABNORMAL LOW (ref 3.5–5.1)
Sodium: 142 mmol/L (ref 135–145)
Total Bilirubin: 0.9 mg/dL (ref 0.0–1.2)
Total Protein: 7.8 g/dL (ref 6.5–8.1)

## 2024-07-27 LAB — RAPID URINE DRUG SCREEN, HOSP PERFORMED
Amphetamines: NOT DETECTED
Barbiturates: NOT DETECTED
Benzodiazepines: POSITIVE — AB
Cocaine: NOT DETECTED
Opiates: NOT DETECTED
Tetrahydrocannabinol: NOT DETECTED

## 2024-07-27 LAB — CBG MONITORING, ED: Glucose-Capillary: 93 mg/dL (ref 70–99)

## 2024-07-27 LAB — CBC WITH DIFFERENTIAL/PLATELET
Abs Immature Granulocytes: 0.03 K/uL (ref 0.00–0.07)
Basophils Absolute: 0 K/uL (ref 0.0–0.1)
Basophils Relative: 0 %
Eosinophils Absolute: 0.4 K/uL (ref 0.0–0.5)
Eosinophils Relative: 4 %
HCT: 33.4 % — ABNORMAL LOW (ref 36.0–46.0)
Hemoglobin: 10.7 g/dL — ABNORMAL LOW (ref 12.0–15.0)
Immature Granulocytes: 0 %
Lymphocytes Relative: 20 %
Lymphs Abs: 2.1 K/uL (ref 0.7–4.0)
MCH: 31.4 pg (ref 26.0–34.0)
MCHC: 32 g/dL (ref 30.0–36.0)
MCV: 97.9 fL (ref 80.0–100.0)
Monocytes Absolute: 0.9 K/uL (ref 0.1–1.0)
Monocytes Relative: 8 %
Neutro Abs: 7.2 K/uL (ref 1.7–7.7)
Neutrophils Relative %: 68 %
Platelets: 226 K/uL (ref 150–400)
RBC: 3.41 MIL/uL — ABNORMAL LOW (ref 3.87–5.11)
RDW: 14.5 % (ref 11.5–15.5)
WBC: 10.6 K/uL — ABNORMAL HIGH (ref 4.0–10.5)
nRBC: 0 % (ref 0.0–0.2)

## 2024-07-27 LAB — PROTIME-INR
INR: 2.8 — ABNORMAL HIGH (ref 0.8–1.2)
Prothrombin Time: 30.9 s — ABNORMAL HIGH (ref 11.4–15.2)

## 2024-07-27 LAB — MRSA NEXT GEN BY PCR, NASAL: MRSA by PCR Next Gen: NOT DETECTED

## 2024-07-27 MED ORDER — VITAMIN K1 10 MG/ML IJ SOLN
INTRAMUSCULAR | Status: AC
  Filled 2024-07-27: qty 1

## 2024-07-27 MED ORDER — DOCUSATE SODIUM 50 MG/5ML PO LIQD
100.0000 mg | Freq: Two times a day (BID) | ORAL | Status: DC
  Administered 2024-07-28 – 2024-07-29 (×4): 100 mg
  Filled 2024-07-27 (×4): qty 10

## 2024-07-27 MED ORDER — CLEVIDIPINE BUTYRATE 0.5 MG/ML IV EMUL: 0.0000 mg/h | INTRAVENOUS | Status: DC

## 2024-07-27 MED ORDER — SODIUM CHLORIDE 3 % IV SOLN
INTRAVENOUS | Status: DC
  Filled 2024-07-27 (×4): qty 500

## 2024-07-27 MED ORDER — ROCURONIUM BROMIDE 10 MG/ML (PF) SYRINGE
PREFILLED_SYRINGE | INTRAVENOUS | Status: AC
Start: 2024-07-27 — End: 2024-07-27
  Administered 2024-07-27: 100 mg
  Filled 2024-07-27: qty 10

## 2024-07-27 MED ORDER — FENTANYL CITRATE PF 50 MCG/ML IJ SOSY: 25.0000 ug | PREFILLED_SYRINGE | INTRAMUSCULAR | Status: DC | PRN

## 2024-07-27 MED ORDER — LABETALOL HCL 5 MG/ML IV SOLN
10.0000 mg | INTRAVENOUS | Status: DC | PRN
  Administered 2024-07-30: 20 mg via INTRAVENOUS
  Filled 2024-07-27 (×2): qty 4

## 2024-07-27 MED ORDER — DILTIAZEM HCL-DEXTROSE 125-5 MG/125ML-% IV SOLN (PREMIX): 5.0000 mg/h | INTRAVENOUS | Status: DC

## 2024-07-27 MED ORDER — STROKE: EARLY STAGES OF RECOVERY BOOK
Freq: Once | Status: AC
  Filled 2024-07-27: qty 1

## 2024-07-27 MED ORDER — SODIUM CHLORIDE 0.9 % IV SOLN
100.0000 mL/h | INTRAVENOUS | Status: DC
  Administered 2024-07-27: 100 mL/h via INTRAVENOUS

## 2024-07-27 MED ORDER — PANTOPRAZOLE SODIUM 40 MG IV SOLR
40.0000 mg | Freq: Every day | INTRAVENOUS | Status: DC
  Administered 2024-07-27 – 2024-07-29 (×3): 40 mg via INTRAVENOUS
  Filled 2024-07-27 (×3): qty 10

## 2024-07-27 MED ORDER — FENTANYL CITRATE (PF) 100 MCG/2ML IJ SOLN
INTRAMUSCULAR | Status: AC
  Filled 2024-07-27: qty 2

## 2024-07-27 MED ORDER — MIDAZOLAM HCL 2 MG/2ML IJ SOLN
10.0000 mg | INTRAMUSCULAR | Status: AC
  Administered 2024-07-28: 10 mg via INTRAVENOUS
  Filled 2024-07-27: qty 10

## 2024-07-27 MED ORDER — CHLORHEXIDINE GLUCONATE CLOTH 2 % EX PADS
6.0000 | MEDICATED_PAD | Freq: Every day | CUTANEOUS | Status: DC
  Administered 2024-07-27 – 2024-07-30 (×3): 6 via TOPICAL

## 2024-07-27 MED ORDER — SODIUM CHLORIDE 0.9 % IV SOLN
INTRAVENOUS | Status: DC
  Administered 2024-07-27: 5 mg via INTRAVENOUS
  Filled 2024-07-27 (×4): qty 25

## 2024-07-27 MED ORDER — ORAL CARE MOUTH RINSE
15.0000 mL | OROMUCOSAL | Status: DC
  Administered 2024-07-27 – 2024-07-30 (×34): 15 mL via OROMUCOSAL

## 2024-07-27 MED ORDER — ORAL CARE MOUTH RINSE
15.0000 mL | OROMUCOSAL | Status: DC
  Administered 2024-07-27: 15 mL via OROMUCOSAL

## 2024-07-27 MED ORDER — VITAMIN K1 10 MG/ML IJ SOLN
10.0000 mg | INTRAVENOUS | Status: AC
  Administered 2024-07-27: 10 mg via INTRAVENOUS
  Filled 2024-07-27: qty 1

## 2024-07-27 MED ORDER — SUCCINYLCHOLINE CHLORIDE 200 MG/10ML IV SOSY
PREFILLED_SYRINGE | INTRAVENOUS | Status: DC
Start: 2024-07-27 — End: 2024-07-27
  Filled 2024-07-27: qty 10

## 2024-07-27 MED ORDER — PROTHROMBIN COMPLEX CONC HUMAN 500 UNITS IV KIT
1500.0000 [IU] | PACK | Status: DC
  Filled 2024-07-27: qty 1500

## 2024-07-27 MED ORDER — SODIUM CHLORIDE 0.9 % IV BOLUS
500.0000 mL | Freq: Once | INTRAVENOUS | Status: AC
  Administered 2024-07-27: 500 mL via INTRAVENOUS

## 2024-07-27 MED ORDER — LEVETIRACETAM (KEPPRA) 500 MG/5 ML ADULT IV PUSH
4500.0000 mg | INTRAVENOUS | Status: AC
  Administered 2024-07-28: 4500 mg via INTRAVENOUS
  Filled 2024-07-27: qty 45

## 2024-07-27 MED ORDER — SULFAMETHOXAZOLE-TRIMETHOPRIM 800-160 MG PO TABS
1.0000 | ORAL_TABLET | Freq: Once | ORAL | Status: DC
  Administered 2024-07-27: 1 via ORAL
  Filled 2024-07-27: qty 1

## 2024-07-27 MED ORDER — LEVETIRACETAM (KEPPRA) 500 MG/5 ML ADULT IV PUSH: 2000.0000 mg | INTRAVENOUS | Status: DC

## 2024-07-27 MED ORDER — PANTOPRAZOLE SODIUM 40 MG IV SOLR: 40.0000 mg | Freq: Every day | INTRAVENOUS | Status: DC

## 2024-07-27 MED ORDER — CEPHALEXIN 500 MG PO CAPS: 500.0000 mg | ORAL_CAPSULE | Freq: Two times a day (BID) | ORAL | 0 refills | Status: DC

## 2024-07-27 MED ORDER — ESMOLOL HCL-SODIUM CHLORIDE 2000 MG/100ML IV SOLN
25.0000 ug/kg/min | INTRAVENOUS | Status: DC
  Administered 2024-07-27: 25 ug/kg/min via INTRAVENOUS
  Administered 2024-07-27: 175 ug/kg/min via INTRAVENOUS
  Filled 2024-07-27 (×4): qty 100

## 2024-07-27 MED ORDER — DILTIAZEM HCL-DEXTROSE 125-5 MG/125ML-% IV SOLN (PREMIX)
5.0000 mg/h | INTRAVENOUS | Status: DC
  Filled 2024-07-27: qty 125

## 2024-07-27 MED ORDER — ACETAMINOPHEN 160 MG/5ML PO SOLN
650.0000 mg | ORAL | Status: DC | PRN
  Administered 2024-07-27 – 2024-07-29 (×5): 650 mg
  Filled 2024-07-27 (×5): qty 20.3

## 2024-07-27 MED ORDER — ETOMIDATE 2 MG/ML IV SOLN
INTRAVENOUS | Status: AC
  Administered 2024-07-27: 20 mg
  Filled 2024-07-27: qty 20

## 2024-07-27 MED ORDER — ACETAMINOPHEN 325 MG PO TABS: 650.0000 mg | ORAL_TABLET | ORAL | Status: DC | PRN

## 2024-07-27 MED ORDER — ORAL CARE MOUTH RINSE: 15.0000 mL | OROMUCOSAL | Status: DC | PRN

## 2024-07-27 MED ORDER — ACETAMINOPHEN 650 MG RE SUPP: 650.0000 mg | RECTAL | Status: DC | PRN

## 2024-07-27 MED ORDER — POLYETHYLENE GLYCOL 3350 17 G PO PACK
17.0000 g | PACK | Freq: Every day | ORAL | Status: DC
  Administered 2024-07-28 – 2024-07-30 (×3): 17 g
  Filled 2024-07-27 (×3): qty 1

## 2024-07-27 MED ORDER — KETAMINE HCL 50 MG/5ML IJ SOSY
PREFILLED_SYRINGE | INTRAMUSCULAR | Status: DC
Start: 2024-07-27 — End: 2024-07-27
  Filled 2024-07-27: qty 10

## 2024-07-27 MED ORDER — PROPOFOL 1000 MG/100ML IV EMUL
0.0000 ug/kg/min | INTRAVENOUS | Status: DC
  Administered 2024-07-28: 20 ug/kg/min via INTRAVENOUS
  Administered 2024-07-28: 30 ug/kg/min via INTRAVENOUS
  Administered 2024-07-28: 20 ug/kg/min via INTRAVENOUS
  Administered 2024-07-28: 40 ug/kg/min via INTRAVENOUS
  Administered 2024-07-29 – 2024-07-30 (×2): 30 ug/kg/min via INTRAVENOUS
  Filled 2024-07-27 (×8): qty 100

## 2024-07-27 MED ORDER — SENNOSIDES-DOCUSATE SODIUM 8.6-50 MG PO TABS
1.0000 | ORAL_TABLET | Freq: Two times a day (BID) | ORAL | Status: DC
  Filled 2024-07-27: qty 1

## 2024-07-27 MED ORDER — FENTANYL CITRATE PF 50 MCG/ML IJ SOSY
25.0000 ug | PREFILLED_SYRINGE | INTRAMUSCULAR | Status: DC | PRN
  Administered 2024-07-27: 50 ug via INTRAVENOUS
  Filled 2024-07-27 (×3): qty 1

## 2024-07-27 MED ORDER — PROTHROMBIN COMPLEX CONC HUMAN 500 UNITS IV KIT
1570.0000 [IU] | PACK | Status: AC
  Administered 2024-07-27: 1570 [IU] via INTRAVENOUS
  Filled 2024-07-27: qty 570

## 2024-07-27 MED ORDER — CEPHALEXIN 500 MG PO CAPS: 500.0000 mg | ORAL_CAPSULE | Freq: Once | ORAL | Status: DC

## 2024-07-27 MED ORDER — MIDAZOLAM HCL 2 MG/2ML IJ SOLN
INTRAMUSCULAR | Status: AC
  Filled 2024-07-27: qty 2

## 2024-07-27 NOTE — ED Notes (Signed)
 20mg   Etomidate  :653pm  100mg  Rock: 654pm  OG 30fr ; 60 at lip Intubate 7.5 , purple 655pm , 23 at the lip Xray here 659pm

## 2024-07-27 NOTE — ED Triage Notes (Signed)
 Pt with wound to right lower leg, pt getting treatment at PT.  Family member reports that new place that is red and very tender.  No warmth to it. Denies any fevers. Redness to back of left lower leg as well.

## 2024-07-27 NOTE — Discharge Instructions (Signed)
 Take all medication as prescribed and return here for concerning changes in your condition.

## 2024-07-27 NOTE — H&P (Signed)
 Neurology H&P  CC: Unresponsive  History is obtained from: Chart review  HPI: Patricia Wu is a 68 y.o. female who was being seen for an open right medial calf wound in the emergency department.  She was evaluated for this and the plan was to discharge her as the wound looked relatively stable.  She was tried to get up to leave and noticed that her right side is weak and a code stroke was activated.  She has a history of atrial fibrillation on anticoagulation with Coumadin  and after a head CT revealed a large ICH   LKW: Noon tpa given?: No, ICH IR Thrombectomy? No, ICH Modified Rankin Scale: 2-Slight disability-UNABLE to perform all activities but does not need assistance NIHSS: 32 ICH score: 4  Past Medical History:  Diagnosis Date   CKD (chronic kidney disease)    Hypercholesteremia    Hypertension    Noncompliance with medication regimen      Family History  Problem Relation Age of Onset   Liver cancer Neg Hx    Colon cancer Neg Hx      Social History:  reports that she has never smoked. She has never been exposed to tobacco smoke. She has never used smokeless tobacco. She reports that she does not drink alcohol  and does not use drugs.   Prior to Admission medications   Medication Sig Start Date End Date Taking? Authorizing Provider  cephALEXin  (KEFLEX ) 500 MG capsule Take 1 capsule (500 mg total) by mouth 2 (two) times daily for 5 days. 07/27/24 08/01/24 Yes Garrick Charleston, MD  acetaminophen  (TYLENOL ) 325 MG tablet Take 325-650 mg by mouth every 6 (six) hours as needed (pain.).     [provider]  albuterol  (VENTOLIN  HFA) 108 (90 Base) MCG/ACT inhaler Inhale 1-2 puffs into the lungs every 6 (six) hours as needed for wheezing or shortness of breath. 07/18/23   Levora Reyes SAUNDERS, MD  atorvastatin  (LIPITOR) 10 MG tablet TAKE 1 TABLET(10 MG) BY MOUTH DAILY 07/22/24   Levora Reyes SAUNDERS, MD  B Complex-C-Folic Acid  (RENA-VITE RX) 1 MG TABS Take 1 tablet by mouth daily.  08/29/23   [provider]  calcitRIOL  (ROCALTROL ) 0.25 MCG capsule Take 0.5 mcg by mouth daily.    [provider]  calcium  acetate (PHOSLO) 667 MG capsule Take 1,334 mg by mouth 3 (three) times daily. 09/25/23   [provider]  furosemide  (LASIX ) 40 MG tablet Take 1 tablet (40 mg total) by mouth as needed for edema (swelling). 08/05/23   Miriam Norris, NP  irbesartan  (AVAPRO ) 150 MG tablet Take 1 tablet (150 mg total) by mouth daily. 01/02/24   Miriam Norris, NP  metoprolol  tartrate (LOPRESSOR ) 100 MG tablet Take 1 tablet (100 mg total) by mouth 2 (two) times daily. 01/02/24   Miriam Norris, NP  metoprolol  tartrate (LOPRESSOR ) 25 MG tablet Take 1 tablet (25 mg total) by mouth 2 (two) times daily. 01/02/24   Miriam Norris, NP  oxyCODONE -acetaminophen  (PERCOCET) 5-325 MG tablet Take 1 tablet by mouth every 6 (six) hours as needed for severe pain. 06/18/23   Oris Krystal FALCON, MD  warfarin (COUMADIN ) 2.5 MG tablet TAKE 2 TABLETS TO 3 TABLETS BY MOUTH DAILY AS DIRECTED BY COUMADIN  CLINIC 07/02/24   Mallipeddi, Diannah SQUIBB, MD     Exam: Current vital signs: BP 123/82   Pulse (!) 110   Temp (!) 101.5 F (38.6 C)   Resp (!) 25   Ht 5' 4 (1.626 m)   Wt 84.6  kg   SpO2 95%   BMI 32.01 kg/m    Physical Exam  Constitutional: Appears well-developed and well-nourished.  Psych: Unresponsive Eyes: No scleral injection HENT: ET tube in place Head: Normocephalic.  Cardiovascular: Normal rate and regular rhythm.  Respiratory: Effort normal and breath sounds normal to anterior ascultation GI: Soft.  No distension. There is no tenderness.  Skin: Wound on her right calf  Neuro: Mental Status: Patient is comatose, does not follow commands or answer questions  cranial Nerves: II: Does not blink to threat.  Right pupil is 2 mm, left pupil is 3 mm, both are reactive but sluggish III,IV, VI: No response to doll's maneuver V: VII: Corneals are absent X: Cough is  absent Motor: She has extensor posturing in the right upper extremity, no movement on the left  sensory: As above  deep Tendon Reflexes: 2+ and symmetric in the biceps and diminished patella Cerebellar: Does not perform  I have reviewed labs in epic and the pertinent results are: UDS positive for benzos  I have reviewed the images obtained: CT head-large IPH with midline shift  Primary Diagnosis:  Nontraumatic intracerebral hemorrhage in hemisphere, cortical  Secondary Diagnosis: Hypertension Emergency (SBP > 180 or DBP > 120 & end organ damage), Paroxysmal atrial fibrillation, and ESRD A-fib with RVR  Impression: 68 year old female with large anticoagulation related intracranial hemorrhage.  Her exam appears worse than her imaging, and I would like to rule out ongoing nonconvulsive status epilepticus.  Will also start her on hyperosmolar therapy with 3% normal saline.  Appreciate neurosurgical evaluation as well.  Plan: Appreciate neurosurgical evaluation Admit to the neuro ICU for close monitoring and hypertonic therapy. Maintain SBP 130-150 Appreciate CCM consultation for medical management including A-fib with RVR Stat EEG to rule out ongoing seizure as etiology of her worsening expected mental status    This patient is critically ill and at significant risk of neurological worsening, death and care requires constant monitoring of vital signs, hemodynamics,respiratory and cardiac monitoring, neurological assessment, discussion with family, other specialists and medical decision making of high complexity. I spent 50 minutes of neurocritical care time  in the care of  this patient. This was time spent independent of any time provided by nurse practitioner or PA.   Aisha Seals, MD [facility]   If 7pm- 7am, please page neurology on call as listed in AMION. 07/27/2024  11:45 PM

## 2024-07-27 NOTE — ED Provider Notes (Signed)
 [place] EMERGENCY DEPARTMENT AT [facility] Provider Note   CSN: [id number] Arrival date & time: 07/27/24  1328     Patient presents with: Wound Infection   Patricia Wu is a 68 y.o. female.   HPI Patient presents with her husband who assists with the history.  Patient has open wound right mid medial calf.  She receives wound care at the clinic.  Today dialysis which she completed, staff thought the wound was more red than it had been, she presents for evaluation.  Patient denies fever, chills, nausea, vomiting, or other complaints.    Prior to Admission medications   Medication Sig Start Date End Date Taking? Authorizing Provider  cephALEXin  (KEFLEX ) 500 MG capsule Take 1 capsule (500 mg total) by mouth 2 (two) times daily for 5 days. 07/27/24 08/01/24 Yes Garrick Charleston, MD  acetaminophen  (TYLENOL ) 325 MG tablet Take 325-650 mg by mouth every 6 (six) hours as needed (pain.).     [provider]  albuterol  (VENTOLIN  HFA) 108 (90 Base) MCG/ACT inhaler Inhale 1-2 puffs into the lungs every 6 (six) hours as needed for wheezing or shortness of breath. 07/18/23   Levora Reyes SAUNDERS, MD  atorvastatin  (LIPITOR) 10 MG tablet TAKE 1 TABLET(10 MG) BY MOUTH DAILY 07/22/24   Levora Reyes SAUNDERS, MD  B Complex-C-Folic Acid  (RENA-VITE RX) 1 MG TABS Take 1 tablet by mouth daily. 08/29/23   [provider]  calcitRIOL  (ROCALTROL ) 0.25 MCG capsule Take 0.5 mcg by mouth daily.    [provider]  calcium  acetate (PHOSLO) 667 MG capsule Take 1,334 mg by mouth 3 (three) times daily. 09/25/23   [provider]  furosemide  (LASIX ) 40 MG tablet Take 1 tablet (40 mg total) by mouth as needed for edema (swelling). 08/05/23   Miriam Norris, NP  irbesartan  (AVAPRO ) 150 MG tablet Take 1 tablet (150 mg total) by mouth daily. 01/02/24   Miriam Norris, NP  metoprolol  tartrate (LOPRESSOR ) 100 MG tablet Take 1 tablet (100 mg total) by mouth 2 (two) times daily. 01/02/24   Miriam Norris, NP  metoprolol  tartrate (LOPRESSOR ) 25 MG tablet Take 1 tablet (25 mg total) by mouth 2 (two) times daily. 01/02/24   Miriam Norris, NP  oxyCODONE -acetaminophen  (PERCOCET) 5-325 MG tablet Take 1 tablet by mouth every 6 (six) hours as needed for severe pain. 06/18/23   EarlyKrystal FALCON, MD  warfarin (COUMADIN ) 2.5 MG tablet TAKE 2 TABLETS TO 3 TABLETS BY MOUTH DAILY AS DIRECTED BY COUMADIN  CLINIC 07/02/24   Mallipeddi, Vishnu P, MD    Allergies: Ibuprofen    Review of Systems  Updated Vital Signs BP (!) 149/87   Pulse 99   Temp (!) 97.4 F (36.3 C) (Oral)   Resp 16   Ht 5' 4 (1.626 m)   Wt 84.6 kg   SpO2 98%   BMI 32.01 kg/m   Physical Exam Vitals and nursing note reviewed.  Constitutional:      General: She is not in acute distress.    Appearance: She is well-developed.  HENT:     Head: Normocephalic and atraumatic.  Eyes:     Conjunctiva/sclera: Conjunctivae normal.  Cardiovascular:     Rate and Rhythm: Regular rhythm. Tachycardia present.     Pulses: Normal pulses.  Pulmonary:     Effort: Pulmonary effort is normal. No respiratory distress.     Breath sounds: Normal breath sounds. No stridor.  Abdominal:     General: There is no distension.  Musculoskeletal:  Legs:  Skin:    General: Skin is warm and dry.      Neurological:     Mental Status: She is alert and oriented to person, place, and time.     Cranial Nerves: No cranial nerve deficit.  Psychiatric:        Mood and Affect: Mood normal.     (all labs ordered are listed, but only abnormal results are displayed) Labs Reviewed  COMPREHENSIVE METABOLIC PANEL WITH GFR - Abnormal; Notable for the following components:      Result Value   Potassium 3.0 (*)    Chloride 95 (*)    Creatinine, Ser 3.98 (*)    Albumin 3.3 (*)    Alkaline Phosphatase 514 (*)    GFR, Estimated 12 (*)    Anion gap 20 (*)    All other components within normal limits  CBC WITH DIFFERENTIAL/PLATELET - Abnormal; Notable  for the following components:   WBC 10.6 (*)    RBC 3.41 (*)    Hemoglobin 10.7 (*)    HCT 33.4 (*)    All other components within normal limits  LACTIC ACID, PLASMA  RAPID URINE DRUG SCREEN, HOSP PERFORMED  CBG MONITORING, ED    EKG: EKG Interpretation Date/Time:  Monday July 27 2024 17:48:01 EDT Ventricular Rate:  171 PR Interval:    QRS Duration:  99 QT Interval:  246 QTC Calculation: 415 R Axis:   37  Text Interpretation: Atrial fibrillation with rapid V-rate Repolarization abnormality, prob rate related Confirmed by Garrick Charleston [phone]) on 07/27/2024 5:50:01 PM  Radiology: ARCOLA Tibia/Fibula Right Result Date: 07/27/2024 CLINICAL DATA:  wound, medial aspect, family request to eval for deep space infection EXAM: RIGHT TIBIA AND FIBULA - 2 VIEW COMPARISON:  None Available. FINDINGS: No acute fracture or dislocation. There is no evidence of arthropathy or other focal bone abnormality. Soft tissue swelling about the calf. Peripheral vascular atherosclerosis. IMPRESSION: Soft tissue swelling about the calf. No radiographic findings of osteomyelitis, at this time. Electronically Signed   By: Rogelia Myers M.D.   On: 07/27/2024 16:51     Procedures   Medications Ordered in the ED  cephALEXin  (KEFLEX ) capsule 500 mg (500 mg Oral Not Given 07/27/24 1638)  sodium chloride  0.9 % bolus 500 mL (has no administration in time range)    Followed by  0.9 %  sodium chloride  infusion (has no administration in time range)  prothrombin  complex conc human (KCENTRA ) IVPB 1,500 Units (has no administration in time range)  phytonadione  (VITAMIN K ) 10 mg in dextrose  5 % 50 mL IVPB (has no administration in time range)                                    Medical Decision Making Adult female relatively immunocompromise due to dialysis now presents with erythema around open wound on her medial calf.  Patient has no other complaints, is awake, alert, afebrile.  Does have mild tachycardia, no  evidence for septic shock.  Patient with labs concern for cellulitis versus bacteremia versus sepsis less likely osteomyelitis.  Amount and/or Complexity of Data Reviewed Independent Historian:     Details: Adult son External Data Reviewed: notes. Labs: ordered. Decision-making details documented in ED Course. Radiology: ordered.  Risk Prescription drug management. Decision regarding hospitalization. Diagnosis or treatment significantly limited by social determinants of health.   Update: Patient, by her daughter as well.  Patient awake,  alert, hemodynamically unremarkable, heart rate has normalized.  Exam consistent with mild cellulitis around nonhealing/slowly healing wound.  No evidence for bacteremia, sepsis, x-ray ordered, unremarkable.  Update: Patient being prepared for discharge when daughter got attention from nurse.  Patient had sudden change in consciousness, last seen normal 1710.  As brought to bedside, patient with obvious left facial droop, left upper extremity weakness, drooling and dysarthria, code stroke activated.  5:48 PM I have reviewed the patient's imaging in the radiology suite, patient with evidence for acute hemorrhage, I discussed with her neurology colleague, and our radiology colleagues, patient starting Kcentra  for Coumadin  reversal, will require transfer to ICU at [facility].  5:50 PM Patient with A-fib on EKG, known history.   Esmolol  starting w goal SBP <130.ED Doc Zavitz aware.  Patient received Kcentra , vitamin K , preparing for transfer  CRITICAL CARE Performed by: Lamar Salen Total critical care time: 45 minutes Critical care time was exclusive of separately billable procedures and treating other patients. Critical care was necessary to treat or prevent imminent or life-threatening deterioration. Critical care was time spent personally by me on the following activities: development of treatment plan with patient and/or surrogate as well  as nursing, discussions with consultants, evaluation of patient's response to treatment, examination of patient, obtaining history from patient or surrogate, ordering and performing treatments and interventions, ordering and review of laboratory studies, ordering and review of radiographic studies, pulse oximetry and re-evaluation of patient's condition.    Final diagnoses:  Cellulitis of right lower extremity  Hemorrhagic stroke [facility])    ED Discharge Orders          Ordered    cephALEXin  (KEFLEX ) 500 MG capsule  2 times daily        07/27/24 1623               Salen Lamar, MD 07/27/24 1845

## 2024-07-27 NOTE — ED Notes (Signed)
 Verbal 100 mcg per min of esmolol 

## 2024-07-27 NOTE — ED Notes (Signed)
CODE STROKE activated  

## 2024-07-27 NOTE — Progress Notes (Signed)
 Patient placed on care link vent immediately following intubation. Receiving RT at [facility] given report and is aware of patients transport.

## 2024-07-27 NOTE — ED Notes (Addendum)
 Pt arrived from [facility]. Upon arrival, pt was already intubated by [facility]. Per report Code Stroke was called at 1721, and on Carelinks arrival pt started to struggle maintain her airway. Pt was intubated at 1855.   20mg   Etomidate  1853  100mg  Roc: 1854 OG 83fr ; 60 at lip Intubate 7.5 , purple 1855 , 23 at the lip  Pt received 50mcg of fentanyl  IV in route by [facility], and 2.5mg  of Versed  at 1915. Another 50cg of Fentanyl  was given at 27.  Leonce RN from 4N at bedside, also received report and reported that he would notify us  when the room was ready.

## 2024-07-27 NOTE — ED Notes (Signed)
[facility] here 

## 2024-07-27 NOTE — ED Provider Notes (Signed)
 After initiation of esmolol , Kcentra , vitamin K , transition to [facility] transport for transfer to our affiliated facility, patient began having vomiting, requiring airway placement.  This was performed without complication, 20 of etomidate , 100 mg of roc, OG tube, x-ray confirmation.  INTUBATION Performed by: Lamar Salen  Required items: required blood products, implants, devices, and special equipment available Patient identity confirmed: provided demographic data and hospital-assigned identification number Time out: Immediately prior to procedure a time out was called to verify the correct patient, procedure, equipment, support staff and site/side marked as required.  Indications: airway protection  Intubation method: Glidescope Laryngoscopy   Preoxygenation: BVM  Sedatives: 20Etomidate Paralytic: 100rocuronium  Tube Size: 7.5 cuffed  Post-procedure assessment: chest rise and ETCO2 monitor Breath sounds: equal and absent over the epigastrium Tube secured with: ETT holder Chest x-ray interpreted by radiologist and me.  Chest x-ray findings: endotracheal tube likely in appropriate position  Patient tolerated the procedure well with no immediate complications.     Salen Lamar, MD 07/27/24 2028

## 2024-07-27 NOTE — Telephone Encounter (Signed)
 Pt did not show for appt. Upon reviewing chart appears she is currently in ED regarding the wound we are treating her for in therapy.  Will follow up.  Greig KATHEE Fuse, PTA/CLT [facility] Ph: [phone]

## 2024-07-27 NOTE — Consult Note (Incomplete)
 NAME:  Patricia Wu, MRN:  [id number], DOB:  1956-02-16, LOS: 0 ADMISSION DATE:  07/27/2024 CONSULTATION DATE:  07/27/2024 REFERRING MD:  Michaela - Neuro, CHIEF COMPLAINT:  R parietal IPH   History of Present Illness:  68 year old woman who presented to [facility] 8/4 as a transfer from [facility] for management of R parietal IPH. PMHx significant for HTN, HLD, Afib (on Coumadin ), ESRD on HD (MWF via LUE AVF.   Initially presented to [facility] ED 8/4 for RLE wound with associated pain and erythema.  Staff at dialysis center reportedly noted worsening erythema of the wound from prior. Concern for cellulitis. On ED arrival, patient was afebrile with HR 99, BP 149/87, RR 16, SpO2 98% on RA. Labs were notable for WBC 10.6, Hgb 10.7, Plt 226. INR 2.8 (on Coumadin ). Na 142, K 3.0, CO2 27, BUN/Cr 17/3.98. Transaminases WNL, Alk Phos 514, Tbili 0.9. LA 1.9. XR R Tib/Fib demonstrated soft tissue swelling, no concern for osteomyelitis. Keflex  initiated.  Patient was being prepared for discharge when ~1710 when daughter called for RN; patient reportedly had abrupt change in LOC with L facial droop, LUE weakness, drooling/dysarthria. Code Stroke called with CT Head demonstrating large R frontal parenchymal hematoma with surrounding edema/mass effect and 6mm leftward midline shift as well as acute R frontoparietal SDH (4mm thickness) with associated SAH over R frontal lobe. Neuro and NSGY consulted. Kcentra  and Vitamin K  administered. Esmolol  gtt started for rate/BP control. Intubated for airway protection. Patient deemed appropriate for transfer to [facility].  On arrival to [facility], repeat CT Head was obtained demonstrating large IPH in the R parietal lobe (6.3 x 5.3 x 4.4) with progressive edema and worsening mass effect of R lateral ventricle with early L ventricular entrapment and leftward midline shift of 5mm. HTS 3% started per Neurology.  PCCM consulted for management of ventilator and hemodynamics.  Pertinent Medical History:    Past Medical History:  Diagnosis Date   A-fib (HCC)    AC with Coumadin    ESRD on dialysis (HCC)    MWF via LUE AVF   Hypercholesteremia    Hypertension    Noncompliance with medication regimen    Significant Hospital Events: Including procedures, antibiotic start and stop dates in addition to other pertinent events   8/4 - Presented to [facility] ED with RLE wound. C/f cellulitis. Keflex  started. Preparing for discharge when patient had abrupt change in consciousness with L-sided facial droop,   Interim History / Subjective:  ***  Objective:  Blood pressure 123/82, pulse (!) 110, temperature (!) 101.5 F (38.6 C), resp. rate (!) 25, height 5' 4 (1.626 m), weight 84.6 kg, SpO2 95%.    Vent Mode: PRVC FiO2 (%):  [40 %] 40 % Set Rate:  [15 bmp-18 bmp] 18 bmp Vt Set:  [400 mL-430 mL] 430 mL PEEP:  [5 cmH20] 5 cmH20 Plateau Pressure:  [22 cmH20-23 cmH20] 23 cmH20   Intake/Output Summary (Last 24 hours) at 07/27/2024 2312 Last data filed at 07/27/2024 2300 Gross per 24 hour  Intake 1018.44 ml  Output 4 ml  Net 1014.44 ml   Filed Weights   07/27/24 1344  Weight: 84.6 kg   Physical Examination: General: {SRACUITY:25313} ill-appearing *** in NAD. HEENT: [place]/AT, anicteric sclera, PERRL, moist mucous membranes. Neuro: {SRLOC:25308} {SRSTIMULI:25309} {SRCOMMANDS:25310} {SRNEUROEXTREMITIES:25312} Strength ***/5 in *** extremities. {SRRBRAINSTEM:25311}  CV: RRR, no m/g/r. PULM: Breathing even and unlabored on ***. Lung fields ***. GI: Soft, nontender, nondistended. Normoactive bowel sounds. Extremities: *** LE edema noted. Skin: Warm/dry, ***.  Resolved Hospital Problem List:    Assessment & Plan:  ***  Best Practice: (right click and Reselect all SmartList Selections daily)   Diet/type: {diet type:25684} DVT prophylaxis: {anticoagulation (Optional):25687} GI prophylaxis: {HP:73065} Lines: {Central Venous Access:25771} Foley:  {Central Venous Access:25691} Code Status:   {Code Status:26939} Last date of multidisciplinary goals of care discussion [***]  Labs:  CBC: Recent Labs  Lab 07/27/24 1455 07/27/24 2203  WBC 10.6*  --   NEUTROABS 7.2  --   HGB 10.7* 11.2*  HCT 33.4* 33.0*  MCV 97.9  --   PLT 226  --    Basic Metabolic Panel: Recent Labs  Lab 07/27/24 1455 07/27/24 2203  NA 142 140  K 3.0* 4.0  CL 95*  --   CO2 27  --   GLUCOSE 93  --   BUN 17  --   CREATININE 3.98*  --   CALCIUM  9.2  --    GFR: Estimated Creatinine Clearance: 14.4 mL/min (A) (by C-G formula based on SCr of 3.98 mg/dL (H)). Recent Labs  Lab 07/27/24 1455  WBC 10.6*  LATICACIDVEN 1.9   Liver Function Tests: Recent Labs  Lab 07/22/24 1524 07/27/24 1455  AST 24 21  ALT 19 22  ALKPHOS 557* 514*  BILITOT 0.8 0.9  PROT 8.3 7.8  ALBUMIN 4.1 3.3*   No results for input(s): LIPASE, AMYLASE in the last 168 hours. No results for input(s): AMMONIA in the last 168 hours.  ABG:    Component Value Date/Time   PHART 7.270 (L) 07/27/2024 2203   PCO2ART 55.8 (H) 07/27/2024 2203   PO2ART 95 07/27/2024 2203   HCO3 25.5 07/27/2024 2203   TCO2 27 07/27/2024 2203   ACIDBASEDEF 2.0 07/27/2024 2203   O2SAT 96 07/27/2024 2203    Coagulation Profile: Recent Labs  Lab 07/27/24 1455  INR 2.8*   Cardiac Enzymes: No results for input(s): CKTOTAL, CKMB, CKMBINDEX, TROPONINI in the last 168 hours.  HbA1C: No results found for: HGBA1C  CBG: Recent Labs  Lab 07/27/24 1723  GLUCAP 93   Review of Systems:   Patient is encephalopathic and/or intubated; therefore, history has been obtained from chart review.   Past Medical History:  She,  has a past medical history of A-fib (HCC), ESRD on dialysis (HCC), Hypercholesteremia, Hypertension, and Noncompliance with medication regimen.   Surgical History:   Past Surgical History:  Procedure Laterality Date   A/V FISTULAGRAM Left 08/15/2023   Procedure: A/V Fistulagram;  Surgeon: Gretta Lonni PARAS,  MD;  Location: [facility] INVASIVE CV LAB;  Service: Cardiovascular;  Laterality: Left;   A/V SHUNT INTERVENTION Left 07/08/2024   Procedure: A/V SHUNT INTERVENTION;  Surgeon: Pearline Norman RAMAN, MD;  Location: [facility] PV LAB;  Service: Cardiovascular;  Laterality: Left;   AV FISTULA PLACEMENT Left 06/18/2023   Procedure: INSERTION OF LEFT ARM ARTERIOVENOUS (AV) FISTULA;  Surgeon: Oris Krystal FALCON, MD;  Location: [facility] ORS;  Service: Vascular;  Laterality: Left;   COLONOSCOPY WITH PROPOFOL  N/A 10/17/2020   Procedure: COLONOSCOPY WITH PROPOFOL ;  Surgeon: Cindie Carlin POUR, DO;  Location: [facility] ENDO SUITE;  Service: Endoscopy;  Laterality: N/A;  1:00pm   IR FLUORO GUIDE CV LINE RIGHT  05/09/2023   IR REMOVAL TUN CV CATH W/O FL  01/28/2024   IR US  GUIDE VASC ACCESS RIGHT  05/10/2023   POLYPECTOMY  10/17/2020   Procedure: POLYPECTOMY;  Surgeon: Cindie Carlin POUR, DO;  Location: [facility] ENDO SUITE;  Service: Endoscopy;;   VENOUS ANGIOPLASTY  07/08/2024   Procedure:  VENOUS ANGIOPLASTY;  Surgeon: Pearline Norman RAMAN, MD;  Location: [facility] PV LAB;  Service: Cardiovascular;;   Social History:   reports that she has never smoked. She has never been exposed to tobacco smoke. She has never used smokeless tobacco. She reports that she does not drink alcohol  and does not use drugs.   Family History:  Her family history is negative for Liver cancer and Colon cancer.   Allergies: Allergies  Allergen Reactions   Ibuprofen Other (See Comments)    PCP said raises her BP   Home Medications: Prior to Admission medications   Medication Sig Start Date End Date Taking? Authorizing Provider  cephALEXin  (KEFLEX ) 500 MG capsule Take 1 capsule (500 mg total) by mouth 2 (two) times daily for 5 days. 07/27/24 08/01/24 Yes Garrick Charleston, MD  acetaminophen  (TYLENOL ) 325 MG tablet Take 325-650 mg by mouth every 6 (six) hours as needed (pain.).     [provider]  albuterol  (VENTOLIN  HFA) 108 (90 Base) MCG/ACT inhaler Inhale 1-2 puffs into the lungs  every 6 (six) hours as needed for wheezing or shortness of breath. 07/18/23   Levora Reyes SAUNDERS, MD  atorvastatin  (LIPITOR) 10 MG tablet TAKE 1 TABLET(10 MG) BY MOUTH DAILY 07/22/24   Levora Reyes SAUNDERS, MD  B Complex-C-Folic Acid  (RENA-VITE RX) 1 MG TABS Take 1 tablet by mouth daily. 08/29/23   [provider]  calcitRIOL  (ROCALTROL ) 0.25 MCG capsule Take 0.5 mcg by mouth daily.    [provider]  calcium  acetate (PHOSLO) 667 MG capsule Take 1,334 mg by mouth 3 (three) times daily. 09/25/23   [provider]  furosemide  (LASIX ) 40 MG tablet Take 1 tablet (40 mg total) by mouth as needed for edema (swelling). 08/05/23   Miriam Norris, NP  irbesartan  (AVAPRO ) 150 MG tablet Take 1 tablet (150 mg total) by mouth daily. 01/02/24   Miriam Norris, NP  metoprolol  tartrate (LOPRESSOR ) 100 MG tablet Take 1 tablet (100 mg total) by mouth 2 (two) times daily. 01/02/24   Miriam Norris, NP  metoprolol  tartrate (LOPRESSOR ) 25 MG tablet Take 1 tablet (25 mg total) by mouth 2 (two) times daily. 01/02/24   Miriam Norris, NP  oxyCODONE -acetaminophen  (PERCOCET) 5-325 MG tablet Take 1 tablet by mouth every 6 (six) hours as needed for severe pain. 06/18/23   Oris Krystal FALCON, MD  warfarin (COUMADIN ) 2.5 MG tablet TAKE 2 TABLETS TO 3 TABLETS BY MOUTH DAILY AS DIRECTED BY COUMADIN  CLINIC 07/02/24   Mallipeddi, Diannah SQUIBB, MD   Critical care time:   The patient is critically ill with multiple organ system failure and requires high complexity decision making for assessment and support, frequent evaluation and titration of therapies, advanced monitoring, review of radiographic studies and interpretation of complex data.   Critical Care Time devoted to patient care services, exclusive of separately billable procedures, described in this note is *** minutes.  Corean CHRISTELLA Charleston Vierling, PA-C [facility] 07/27/24 11:12 PM  Please see [email or web address] for pager details.  From 7A-7P if no response,  please call [phone] After hours, please call ELink [phone]

## 2024-07-27 NOTE — ED Provider Notes (Signed)
 Patient arrived hypertensive 159/145 with variable heart rate around the 130s primarily.  Appears A-fib on EKG from outside hospital.  She is intubated and oxygenating well.  She is not on esmolol  drip presumptively for blood pressure and rate control.  She did receive Kcentra  and vitamin K .  GCS 3T.  Spoke with neurosurgery shortly after arrival at approximately 7:50 PM who agreed to see the patient.  Will continue to try to reduce heart rate and BP in the meantime with titration of esmolol .  Blood pressure goal approximately 140 systolic and heart rate less than 110.  I did speak with the family and updated them on patient's status.  She was transitioned to the neuro ICU for further workup and treatment in critical condition.   Sharyne Darina RAMAN, MD 07/27/24 2015    Tonia Chew, MD 07/28/24 [phone]

## 2024-07-27 NOTE — ED Notes (Signed)
 Pt was exiting with [facility]link and they requested pt to be intubated . RT aware and EDP .

## 2024-07-27 NOTE — Progress Notes (Signed)
 Patient ID: Patricia Wu, female   DOB: 19-Jul-1956, 68 y.o.   MRN: [id number] BP (!) 165/105   Pulse (!) 112   Temp (!) 97.4 F (36.3 C) (Oral)   Resp (!) 33   Ht 5' 4 (1.626 m)   Wt 84.6 kg   SpO2 100%   BMI 32.01 kg/m  Films reviewed. At this time no acute intervention needed. Will repeat scan due to poor exam. No movement on left side.

## 2024-07-27 NOTE — ED Notes (Signed)
 Pt noted to have been going to dialysis more than 6 months. Pt takes coumadin ..

## 2024-07-27 NOTE — Consult Note (Addendum)
 [facility] TeleNeurology Consult Services    Date of Service:  07/27/2024     Metrics: Last Known Well: 1710 Symptoms: As per HPI.  Patient is not a candidate for thrombolytic: ICH.   Location of the provider: [facility]  Location of the patient: [facility] Pre-Morbid Modified Rankin Scale: 0   This consult was provided via telemedicine with 2-way video and audio communication. The patient/family was informed that care would be provided in this way and agreed to receive care in this manner.   ED Physician notified of diagnostic impression and management plan at: 6:12 PM   Assessment: 68 year old female with ESRD who presented for worsened symptoms involving her known right calf wound.  In the ED she developed sudden onset of left sided weakness, left facial droop, drooling and dysarthria. CT head reveals a large acute right frontal lobe ICH.  - Exam reveals multiple deficits referable to the right frontal ICH seen on CT. NIHSS 20.  - CT head:  Large right frontal parenchymal hematoma with surrounding edema, mass effect, and sulcal effacement. 6mm leftward midline shift. Acute right frontoparietal convexity subdural hematoma measuring up to 4 mm in thickness. Subarachnoid hemorrhage over the right frontal lobe. Partial effacement of the right lateral ventricle and third ventricle. - Labs: - eGFR of 12. On HD, so can obtain CTA if needed - WBC 10.6 - Glucose 93 - Impression: Acute right frontal lobe ICH in an ESRD patient on chronic oral anticoagulation for atrial fibrillation. Other than anticoagulation, her risk factors include ESRD and HTN.       Recommendations: - STAT administration of KCentra .  - STAT INR and PT - Esmolol  drip for rate control. If BP not within post-ICH goal of 130-150 after HR is normalized, then start Cleviprex  - Transferring to [facility] for admission to the ICU under the neurology service - STAT repeat CT in 6 hours (ordered  for 11:45 PM) - MRI/MRA of head - Carotid ultrasound - TTE - PT consult, OT consult, Speech consult - Cardiac telemetry - Frequent neuro checks - Stop warfarin. Given ICH while on warfarin, it should be stopped indefinitely. If hemorrhage resorbing with no complications at 2-4 weeks, can consider starting ASA for stroke prevention in the setting of her atrial fibrillation.  - Given her ESRD, Nephrology consult at [facility] most likely will be needed to follow if hypertonic saline 3% at 50 cc/hr is to be started. Decision to start hypertonic saline should be based on subsequent scans, as there is not enough edema adjacent to the hemorrhage on initial CT to provide a strong indication at this time.  - No antiplatelet medications or anticoagulants    ------------------------------------------------------------------------------   History of Present Illness: The patient is a 68 year old female with a PMHx of ESRD on HD, atrial fibrillation, hypercholesterolemia, HTN and medication noncompliance who presented to the ED today with a c/c of a very tender, red wound to her RLE which seemed worse after dialysis. She was managed in the ED and was slated for discharge, but shortly before discharge she developed acute onset of left sided weakness, left facial droop, drooling and dysarthria. Code Stroke was then called.       Past Medical History: Past Medical History:  Diagnosis Date   CKD (chronic kidney disease)    Hypercholesteremia    Hypertension    Noncompliance with medication regimen      Past Surgical History: Past Surgical History:  Procedure Laterality Date  A/V FISTULAGRAM Left 08/15/2023   Procedure: A/V Fistulagram;  Surgeon: Gretta Lonni PARAS, MD;  Location: [facility] INVASIVE CV LAB;  Service: Cardiovascular;  Laterality: Left;   A/V SHUNT INTERVENTION Left 07/08/2024   Procedure: A/V SHUNT INTERVENTION;  Surgeon: Pearline Norman RAMAN, MD;  Location: [facility] PV LAB;  Service: Cardiovascular;   Laterality: Left;   AV FISTULA PLACEMENT Left 06/18/2023   Procedure: INSERTION OF LEFT ARM ARTERIOVENOUS (AV) FISTULA;  Surgeon: Oris Krystal FALCON, MD;  Location: [facility] ORS;  Service: Vascular;  Laterality: Left;   COLONOSCOPY WITH PROPOFOL  N/A 10/17/2020   Procedure: COLONOSCOPY WITH PROPOFOL ;  Surgeon: Cindie Carlin POUR, DO;  Location: [facility] ENDO SUITE;  Service: Endoscopy;  Laterality: N/A;  1:00pm   IR FLUORO GUIDE CV LINE RIGHT  05/09/2023   IR REMOVAL TUN CV CATH W/O FL  01/28/2024   IR US  GUIDE VASC ACCESS RIGHT  05/10/2023   POLYPECTOMY  10/17/2020   Procedure: POLYPECTOMY;  Surgeon: Cindie Carlin POUR, DO;  Location: [facility] ENDO SUITE;  Service: Endoscopy;;   VENOUS ANGIOPLASTY  07/08/2024   Procedure: VENOUS ANGIOPLASTY;  Surgeon: Pearline Norman RAMAN, MD;  Location: [facility] PV LAB;  Service: Cardiovascular;;     Medications:  Current Facility-Administered Medications on File Prior to Encounter  Medication Dose Route Frequency Provider Last Rate Last Admin   0.9 %  sodium chloride  infusion  250 mL Intravenous PRN Gretta Lonni PARAS, MD       sodium chloride  flush (NS) 0.9 % injection 3 mL  3 mL Intravenous Q12H Gretta Lonni PARAS, MD       sodium chloride  flush (NS) 0.9 % injection 3 mL  3 mL Intravenous PRN Gretta Lonni PARAS, MD       Current Outpatient Medications on File Prior to Encounter  Medication Sig Dispense Refill   acetaminophen  (TYLENOL ) 325 MG tablet Take 325-650 mg by mouth every 6 (six) hours as needed (pain.).      albuterol  (VENTOLIN  HFA) 108 (90 Base) MCG/ACT inhaler Inhale 1-2 puffs into the lungs every 6 (six) hours as needed for wheezing or shortness of breath. 8 g 0   atorvastatin  (LIPITOR) 10 MG tablet TAKE 1 TABLET(10 MG) BY MOUTH DAILY 90 tablet 2   B Complex-C-Folic Acid  (RENA-VITE RX) 1 MG TABS Take 1 tablet by mouth daily.     calcitRIOL  (ROCALTROL ) 0.25 MCG capsule Take 0.5 mcg by mouth daily.     calcium  acetate (PHOSLO) 667 MG capsule Take 1,334 mg by mouth 3 (three)  times daily.     furosemide  (LASIX ) 40 MG tablet Take 1 tablet (40 mg total) by mouth as needed for edema (swelling). 30 tablet 2   irbesartan  (AVAPRO ) 150 MG tablet Take 1 tablet (150 mg total) by mouth daily. 30 tablet 6   metoprolol  tartrate (LOPRESSOR ) 100 MG tablet Take 1 tablet (100 mg total) by mouth 2 (two) times daily. 180 tablet 1   metoprolol  tartrate (LOPRESSOR ) 25 MG tablet Take 1 tablet (25 mg total) by mouth 2 (two) times daily. 180 tablet 1   oxyCODONE -acetaminophen  (PERCOCET) 5-325 MG tablet Take 1 tablet by mouth every 6 (six) hours as needed for severe pain. 8 tablet 0   warfarin (COUMADIN ) 2.5 MG tablet TAKE 2 TABLETS TO 3 TABLETS BY MOUTH DAILY AS DIRECTED BY COUMADIN  CLINIC 210 tablet 0       Social History: Non-smoker.    Family History:  Reviewed in Epic   ROS: As per HPI    Anticoagulant use:  Coumadin   Antiplatelet use: None   Examination:    BP (!) 149/87   Pulse 99   Temp (!) 97.4 F (36.3 C) (Oral)   Resp 16   Ht 5' 4 (1.626 m)   Wt 84.6 kg   SpO2 98%   BMI 32.01 kg/m     1A: Level of Consciousness - 0 1B: Ask Month and Age - 0 1C: Blink Eyes & Squeeze Hands - 0 2: Test Horizontal Extraocular Movements - 1 (right gaze deviation, but can cross to left with coaching) 3: Test Visual Fields - 2 (left hemianopsia) 4: Test Facial Palsy (Use Grimace if Obtunded) - 2 (left facial weakness) 5A: Test Left Arm Motor Drift - 3 5B: Test Right Arm Motor Drift - 0 6A: Test Left Leg Motor Drift - 3 6B: Test Right Leg Motor Drift - 3 7: Test Limb Ataxia (FNF/Heel-Shin) - 0 8: Test Sensation -  2 (severe left sided sensory loss) 9: Test Language/Aphasia - 1 (fragmentary but to an extent intelligible) 10: Test Dysarthria - 1 11: Test Extinction/Inattention - 2   NIHSS Score: 20      Patient/Family was informed the Neurology Consult would occur via TeleHealth consult by way of interactive audio and video telecommunications and consented to receiving  care in this manner.   Patient is being evaluated for possible acute neurologic impairment and high pretest probability of imminent or life-threatening deterioration. I spent total of 60 minutes providing care to this critically ill patient, including time for face to face visit via telemedicine, review of medical records, imaging studies and discussion of findings with providers, the patient and/or family.   Electronically signed: Dr. Kielan Dreisbach

## 2024-07-27 NOTE — Progress Notes (Signed)
STAT EEG complete - results pending. ? ?

## 2024-07-27 NOTE — ED Provider Notes (Deleted)
 After recheck for d/c w patient in [facility], she was found to have L facial droop w dysarthria and L UE weakness.  Code Stroke activated.  LSN [id number].   Garrick Charleston, MD 07/27/24 [phone]

## 2024-07-27 NOTE — ED Notes (Signed)
[facility] called to transfer patient.

## 2024-07-27 NOTE — Progress Notes (Signed)
 eLink Physician-Brief Progress Note Patient Name: [facility] DOB: 02/25/1956 MRN: [id number]   Date of Service  07/27/2024  HPI/Events of Note  68 year old female with ESRD who was seen at an outside hospital for right lower extremity wound.  She was getting ready to be discharged when she was noted to have right-sided weakness.  Head CT revealed a large intracranial hemorrhage.  This was in the setting of her being on chronic anticoagulation for A-fib.  She received PCC and was started on esmolol  for blood pressure control.  She was intubated for airway protection.  She was transferred to [facility] for neurosurgical evaluation.  Repeat CT of the head showed large bleed with edema and worsening mass effect with left shift.  eICU Interventions  Patient's chart reviewed.  Pertinent labs and imaging studies reviewed.  Video assessment of patient done. Impression: Intracranial hemorrhage with neurologic devastation Plan: Reversal of anticoagulation Keppra  for seizure prevention Blood pressure control Neurosurgery recommendations noted.  Poor prognosis.     Intervention Category Evaluation Type: New Patient Evaluation  Jerilynn Berg 07/27/2024, 10:37 PM

## 2024-07-27 NOTE — ED Notes (Signed)
 Nurse came into room to discharge pt  and upon trying to attempt to get pt to sit up. Pt was being flaccid and not trying to help the nurse. Nurse asked the daughter  does she use a w/c or cane?. Daughter stated 'no she is playing. Then pt began to drool and daughter stated what did you guys do to her?. Nurse explained that she was unaware of what she meant. Nurse and daughter collaborated pt had become confused, drooling and left side facial droop. Nurse got EDP and called a code stroke.

## 2024-07-28 ENCOUNTER — Inpatient Hospital Stay (HOSPITAL_COMMUNITY)

## 2024-07-28 DIAGNOSIS — I618 Other nontraumatic intracerebral hemorrhage: Secondary | ICD-10-CM

## 2024-07-28 DIAGNOSIS — I482 Chronic atrial fibrillation, unspecified: Secondary | ICD-10-CM

## 2024-07-28 DIAGNOSIS — I161 Hypertensive emergency: Secondary | ICD-10-CM

## 2024-07-28 DIAGNOSIS — J9600 Acute respiratory failure, unspecified whether with hypoxia or hypercapnia: Secondary | ICD-10-CM

## 2024-07-28 DIAGNOSIS — G9389 Other specified disorders of brain: Secondary | ICD-10-CM | POA: Diagnosis not present

## 2024-07-28 DIAGNOSIS — E785 Hyperlipidemia, unspecified: Secondary | ICD-10-CM

## 2024-07-28 DIAGNOSIS — G936 Cerebral edema: Secondary | ICD-10-CM

## 2024-07-28 DIAGNOSIS — I4891 Unspecified atrial fibrillation: Secondary | ICD-10-CM

## 2024-07-28 DIAGNOSIS — G935 Compression of brain: Secondary | ICD-10-CM | POA: Diagnosis not present

## 2024-07-28 DIAGNOSIS — I619 Nontraumatic intracerebral hemorrhage, unspecified: Secondary | ICD-10-CM

## 2024-07-28 DIAGNOSIS — Z992 Dependence on renal dialysis: Secondary | ICD-10-CM

## 2024-07-28 DIAGNOSIS — S06310A Contusion and laceration of right cerebrum without loss of consciousness, initial encounter: Secondary | ICD-10-CM | POA: Diagnosis not present

## 2024-07-28 DIAGNOSIS — R29818 Other symptoms and signs involving the nervous system: Secondary | ICD-10-CM | POA: Diagnosis not present

## 2024-07-28 DIAGNOSIS — I609 Nontraumatic subarachnoid hemorrhage, unspecified: Secondary | ICD-10-CM | POA: Diagnosis not present

## 2024-07-28 DIAGNOSIS — T45515A Adverse effect of anticoagulants, initial encounter: Secondary | ICD-10-CM | POA: Diagnosis not present

## 2024-07-28 DIAGNOSIS — I69391 Dysphagia following cerebral infarction: Secondary | ICD-10-CM

## 2024-07-28 DIAGNOSIS — I10 Essential (primary) hypertension: Secondary | ICD-10-CM

## 2024-07-28 DIAGNOSIS — G40901 Epilepsy, unspecified, not intractable, with status epilepticus: Secondary | ICD-10-CM | POA: Diagnosis not present

## 2024-07-28 DIAGNOSIS — I611 Nontraumatic intracerebral hemorrhage in hemisphere, cortical: Secondary | ICD-10-CM | POA: Diagnosis not present

## 2024-07-28 LAB — CBC
HCT: 28.7 % — ABNORMAL LOW (ref 36.0–46.0)
Hemoglobin: 9.3 g/dL — ABNORMAL LOW (ref 12.0–15.0)
MCH: 31.4 pg (ref 26.0–34.0)
MCHC: 32.4 g/dL (ref 30.0–36.0)
MCV: 97 fL (ref 80.0–100.0)
Platelets: 212 K/uL (ref 150–400)
RBC: 2.96 MIL/uL — ABNORMAL LOW (ref 3.87–5.11)
RDW: 14.4 % (ref 11.5–15.5)
WBC: 14.8 K/uL — ABNORMAL HIGH (ref 4.0–10.5)
nRBC: 0 % (ref 0.0–0.2)

## 2024-07-28 LAB — BASIC METABOLIC PANEL WITH GFR
Anion gap: 20 — ABNORMAL HIGH (ref 5–15)
BUN: 21 mg/dL (ref 8–23)
CO2: 22 mmol/L (ref 22–32)
Calcium: 8.2 mg/dL — ABNORMAL LOW (ref 8.9–10.3)
Chloride: 101 mmol/L (ref 98–111)
Creatinine, Ser: 4.99 mg/dL — ABNORMAL HIGH (ref 0.44–1.00)
GFR, Estimated: 9 mL/min — ABNORMAL LOW (ref >60)
Glucose, Bld: 135 mg/dL — ABNORMAL HIGH (ref 70–99)
Potassium: 3.8 mmol/L (ref 3.5–5.1)
Sodium: 143 mmol/L (ref 135–145)

## 2024-07-28 LAB — SODIUM
Sodium: 143 mmol/L (ref 135–145)
Sodium: 148 mmol/L — ABNORMAL HIGH (ref 135–145)
Sodium: 153 mmol/L — ABNORMAL HIGH (ref 135–145)
Sodium: 153 mmol/L — ABNORMAL HIGH (ref 135–145)

## 2024-07-28 LAB — MAGNESIUM: Magnesium: 2 mg/dL (ref 1.7–2.4)

## 2024-07-28 LAB — URINALYSIS, ROUTINE W REFLEX MICROSCOPIC
Bilirubin Urine: NEGATIVE
Glucose, UA: 50 mg/dL — AB
Ketones, ur: NEGATIVE mg/dL
Nitrite: NEGATIVE
Protein, ur: 100 mg/dL — AB
RBC / HPF: >50 RBC/hpf (ref 0–5)
Specific Gravity, Urine: 1.019 (ref 1.005–1.030)
WBC, UA: >50 WBC/hpf (ref 0–5)
pH: 7 (ref 5.0–8.0)

## 2024-07-28 LAB — PROTIME-INR
INR: 1.6 — ABNORMAL HIGH (ref 0.8–1.2)
INR: 1.6 — ABNORMAL HIGH (ref 0.8–1.2)
INR: 1.8 — ABNORMAL HIGH (ref 0.8–1.2)
Prothrombin Time: 19.6 s — ABNORMAL HIGH (ref 11.4–15.2)
Prothrombin Time: 20.1 s — ABNORMAL HIGH (ref 11.4–15.2)
Prothrombin Time: 21.5 s — ABNORMAL HIGH (ref 11.4–15.2)

## 2024-07-28 LAB — HEPATITIS B SURFACE ANTIGEN: Hepatitis B Surface Ag: NONREACTIVE

## 2024-07-28 LAB — ECHOCARDIOGRAM COMPLETE
AR max vel: 1.52 cm2
AV Area VTI: 1.51 cm2
AV Area mean vel: 1.26 cm2
AV Mean grad: 7 mmHg
AV Peak grad: 10 mmHg
Ao pk vel: 1.58 m/s
Area-P 1/2: 3.97 cm2
Height: 64 in
S' Lateral: 3.6 cm
Weight: 3139.35 [oz_av]

## 2024-07-28 LAB — GLUCOSE, CAPILLARY: Glucose-Capillary: 107 mg/dL — ABNORMAL HIGH (ref 70–99)

## 2024-07-28 LAB — PHOSPHORUS: Phosphorus: 6.1 mg/dL — ABNORMAL HIGH (ref 2.5–4.6)

## 2024-07-28 LAB — TRIGLYCERIDES: Triglycerides: 64 mg/dL (ref <150)

## 2024-07-28 LAB — HIV ANTIBODY (ROUTINE TESTING W REFLEX): HIV Screen 4th Generation wRfx: NONREACTIVE

## 2024-07-28 MED ORDER — SODIUM CHLORIDE 0.9 % IV SOLN
INTRAVENOUS | Status: DC
  Administered 2024-07-28: 10 mg via INTRAVENOUS
  Filled 2024-07-28 (×3): qty 25

## 2024-07-28 MED ORDER — DILTIAZEM HCL-DEXTROSE 125-5 MG/125ML-% IV SOLN (PREMIX)
5.0000 mg/h | INTRAVENOUS | Status: DC
  Filled 2024-07-28: qty 125

## 2024-07-28 MED ORDER — SODIUM CHLORIDE 23.4 % INJECTION (4 MEQ/ML) FOR IV ADMINISTRATION
120.0000 meq | Freq: Four times a day (QID) | INTRAVENOUS | Status: DC | PRN
  Administered 2024-07-30 (×2): 120 meq via INTRAVENOUS
  Filled 2024-07-28 (×2): qty 30

## 2024-07-28 MED ORDER — LEVETIRACETAM (KEPPRA) 500 MG/5 ML ADULT IV PUSH
500.0000 mg | Freq: Two times a day (BID) | INTRAVENOUS | Status: DC
  Administered 2024-07-28 – 2024-07-30 (×5): 500 mg via INTRAVENOUS
  Filled 2024-07-28 (×5): qty 5

## 2024-07-28 MED ORDER — SODIUM CHLORIDE 0.9 % IV SOLN
1.0000 g | INTRAVENOUS | Status: DC
  Administered 2024-07-28 – 2024-07-29 (×2): 1 g via INTRAVENOUS
  Filled 2024-07-28 (×3): qty 10

## 2024-07-28 MED ORDER — CHLORHEXIDINE GLUCONATE CLOTH 2 % EX PADS: 6.0000 | MEDICATED_PAD | Freq: Every day | CUTANEOUS | Status: DC

## 2024-07-28 MED ORDER — VITAMIN K1 10 MG/ML IJ SOLN
10.0000 mg | INTRAVENOUS | Status: AC
  Administered 2024-07-28: 10 mg via INTRAVENOUS
  Filled 2024-07-28: qty 1

## 2024-07-28 MED ORDER — IOHEXOL 350 MG/ML SOLN
75.0000 mL | Freq: Once | INTRAVENOUS | Status: AC | PRN
  Administered 2024-07-28: 75 mL via INTRAVENOUS

## 2024-07-28 MED ORDER — NOREPINEPHRINE 4 MG/250ML-% IV SOLN
0.0000 ug/min | INTRAVENOUS | Status: DC
  Administered 2024-07-28: 5 ug/min via INTRAVENOUS
  Filled 2024-07-28: qty 250

## 2024-07-28 NOTE — Progress Notes (Signed)
 EEG revealed excessive fast activity in the left hemisphere with underlying irregular slow activity with occasional intermixed sharp activity.  My suspicion is that this represents excessive beta with cerebral dysfunction on the right resulting in attenuation of the faster activity, however, especially given a worse than expected exam, I felt that benzodiazepine challenge starting Keppra  would be prudent.  The patient was already clinically improving prior to medication administration, and the pattern attenuated rapidly following benzodiazepine and starting on propofol .  I will continue Keppra  for now, and continue EEG monitoring.  Aisha Seals, MD [facility]   If 7pm- 7am, please page neurology on call as listed in AMION.

## 2024-07-28 NOTE — Procedures (Signed)
 Central Venous Catheter Insertion Procedure Note  [facility]  [id number]  12/02/1956  Date:07/28/24  Time:12:23 AM   Provider Performing:Jencarlos Nicolson CHRISTELLA Ilah   Procedure: Insertion of Non-tunneled Central Venous Catheter(36556) with US  guidance (23062)   Indication(s) Medication administration and Difficult access  Consent Risks of the procedure as well as the alternatives and risks of each were explained to the patient and/or caregiver.  Consent for the procedure was obtained verbally at bedside from patient's husband, Carliss and daughter, Darus.  Anesthesia Topical only with 1% lidocaine    Timeout Verified patient identification, verified procedure, site/side was marked, verified correct patient position, special equipment/implants available, medications/allergies/relevant history reviewed, required imaging and test results available.  Sterile Technique Maximal sterile technique including full sterile barrier drape, hand hygiene, sterile gown, sterile gloves, mask, hair covering, sterile ultrasound probe cover (if used).  Procedure Description Area of catheter insertion was cleaned with chlorhexidine  and draped in sterile fashion.  With real-time ultrasound guidance a central venous catheter was placed into the left internal jugular vein. Nonpulsatile blood flow and easy flushing noted in all ports.  The catheter was sutured in place and sterile dressing applied.    Complications/Tolerance None; patient tolerated the procedure well. Chest X-ray is ordered to verify placement for internal jugular or subclavian cannulation.   Chest x-ray is not ordered for femoral cannulation.  EBL Minimal  Specimen(s) None  Corean CHRISTELLA Ilah, PA-C [facility] 07/28/24 12:24 AM  Please see [email or web address] for pager details.  From 7A-7P if no response, please call [phone] After hours, please call ELink [phone]

## 2024-07-28 NOTE — Progress Notes (Addendum)
 STROKE TEAM PROGRESS NOTE    SIGNIFICANT HOSPITAL EVENTS 8/4 - Presented to [facility] ED with RLE wound. C/f cellulitis. Keflex  started. Preparing for discharge when patient had abrupt change in consciousness with L-sided facial droop, LUE weakness, dysarthria. Code Stroke called. Large R parietal IPH on CT. Intubated. Transferred to [facility] for further management. Repeat CT with worsening edema/hemorrhage. PCCM consulted for vent/BP management.   SUBJECTIVE (INTERIM HISTORY) Her husband and daughter are at bedside.  She transferred from [facility] ED acute stroke symptoms with CT showing large R frontal hematoma with 6mm leftward midline shift and acute R frontoparietal SDH with associated SAH over R frontal lobe. Patient is overall unstable. She is intubated on sedation and unresponsive to verbal and tactile stimulation. She is demonstrating lower limb posturing to noxious stimuli with increased  tone. Tone is increased causing her to bite on NG tube. Via manual eye opening, eyes midline, not tracking, with absent dolls eye. Corneal reflex is absent on L and slightly present on R. INR continues to be elevated despite Kcentra  and vitamin K .    OBJECTIVE Vitals:   07/28/24 1130 07/28/24 1200 07/28/24 1300 07/28/24 1400  BP: (!) 127/58 113/65 104/62 (!) 106/56  Pulse:      Resp: (!) 23 (!) 28 (!) 22 (!) 26  Temp: (!) 100.6 F (38.1 C) (!) 100.6 F (38.1 C) (!) 100.8 F (38.2 C) (!) 100.8 F (38.2 C)  TempSrc:      SpO2:      Weight:      Height:         CBC    Component Value Date/Time   WBC 14.8 (H) 07/28/2024 0408   RBC 2.96 (L) 07/28/2024 0408   HGB 9.3 (L) 07/28/2024 0408   HCT 28.7 (L) 07/28/2024 0408   PLT 212 07/28/2024 0408   MCV 97.0 07/28/2024 0408   MCV 88.2 05/05/2014 0849   MCH 31.4 07/28/2024 0408   MCHC 32.4 07/28/2024 0408   RDW 14.4 07/28/2024 0408   LYMPHSABS 2.1 07/27/2024 1455   MONOABS 0.9 07/27/2024 1455   EOSABS 0.4 07/27/2024 1455   BASOSABS 0.0 07/27/2024 1455     BMET    Component Value Date/Time   NA 148 (H) 07/28/2024 0955   NA 146 (H) 02/08/2021 0950   K 3.8 07/28/2024 0408   CL 101 07/28/2024 0408   CO2 22 07/28/2024 0408   GLUCOSE 135 (H) 07/28/2024 0408   BUN 21 07/28/2024 0408   BUN 28 (H) 02/08/2021 0950   CREATININE 4.99 (H) 07/28/2024 0408   CREATININE 1.14 (H) 05/05/2014 0846   CALCIUM  8.2 (L) 07/28/2024 0408   GFRNONAA 9 (L) 07/28/2024 0408    IMAGING past 24 hours Overnight EEG with video Result Date: 07/28/2024 Patricia Jurist, MD     07/28/2024 12:51 PM Continuous Video-EEG Monitoring Report CPT/Type of Study: 95720 (EEG with video, 12-26 hours)      Indications for Procedure: Altered mental status Primary neurological diagnosis: G93.40; I61.9 Duration of study: 07/27/2024 23:45 to 07/28/2024 12:45 History: This is a 68 year old female presenting with acute altered mental status and left-sided weakness in the context of large intraparenchymal hematoma centered in the right parietal lobe. Continuous video-EEG monitoring was performed to evaluate for seizure. Technical Description: The EEG was performed using standard setting per the guidelines of American Clinical Neurophysiology Society (ACNS). A minimum of 21 electrodes were placed on scalp according to the International 10-20 or/and 10-10 Systems. Supplemental electrodes were placed as needed. Single EKG electrode  was also used to detect cardiac arrhythmia. Patient's behavior was continuously recorded on video simultaneously with EEG. A minimum of 16 channels were used for data display. Each epoch of study was reviewed manually daily and as needed using standard referential and bipolar montages. Computerized quantitative EEG analysis (such as compressed spectral array analysis, trending, automated spike & seizure detection) were used as indicated. EEG Description: Epoch: 07/27/2024 23:45 to 07/28/2024 12:45 Background: There was generalized polymorphic delta and theta slowing. The polymorphic  delta slowing was most prominent and persistent in the right frontotemporal region. No discernible posterior dominant rhythm or sleep architecture was recorded. Diffuse beta and spindle-like activity was present, most likely due to medication effect (propofol , benzodiazepine). PERIODIC OR RHYTHMIC PATTERNS: None. EPILEPTIFORM DISCHARGES: None. SEIZURES: None. EVENTS: None. EKG: Atrial fibrillation was present. Impression: This is an abnormal EEG due to the presence of the following: 1) Generalized polymorphic delta and theta slowing, suggesting moderate to severe encephalopathy, which is nonspecific as to etiology (medication effect of propofol  and benzodiazepine could not be ruled out); 2) Persistent focal polymorphic delta slowing in the right frontotemporal region, suggesting focal cerebral dysfunction or/and structural lesion. No epileptiform discharge or seizure is present.     CT ANGIO HEAD W OR WO CONTRAST Result Date: 07/28/2024 EXAM: CTA Head Without and With Intravenous Contrast CLINICAL HISTORY: Stroke, hemorrhagic; Assess for spot sign. CT HEAD POST STROKE FOLLOWUP/TIMED/STAT READ; Neuro deficit, acute, stroke suspected; CT ANGIO HEAD W OR WO CONTRAST; Stroke, hemorrhagic, Assess for spot sign TECHNIQUE: Axial CTA images of the head without and with intravenous contrast. MIP reconstructed images were created and reviewed. Dose reduction technique was used including one or more of the following: automated exposure control, adjustment of mA and kV according to patient size, and/or iterative reconstruction. CONTRAST: 75 mL iohexol  (OMNIPAQUE ) 350 MG/ML injection 75 mL IOHEXOL  350 MG/ML SOLN COMPARISON: 07/27/2024 FINDINGS: INTERNAL CAROTID ARTERIES: There is a 4 mm posteriorly directed aneurysm arising from the communicating segment of the right ICA. There is no extravasation visualized within the right parietal hematoma. ANTERIOR CEREBRAL ARTERIES: No significant stenosis. No occlusion. No aneurysm. MIDDLE  CEREBRAL ARTERIES: No significant stenosis. No occlusion. No aneurysm. POSTERIOR CEREBRAL ARTERIES: No significant stenosis. No occlusion. No aneurysm. BASILAR ARTERY: No significant stenosis. No occlusion. No aneurysm. VERTEBRAL ARTERIES: No significant stenosis. No occlusion. No aneurysm. SOFT TISSUES: No acute finding. No masses or lymphadenopathy. BONES: No acute osseous abnormality. IMPRESSION: 1. No extravasation visualized within the right parietal hematoma. 2. 4 mm posteriorly directed aneurysm arising from the communicating segment of the right ICA. Electronically signed by: Franky Stanford MD 07/28/2024 03:35 AM EDT RP Workstation: [id number]   CT HEAD POST STROKE FOLLOWUP/TIMED/STAT READ Result Date: 07/28/2024 EXAM: CT HEAD WITHOUT CONTRAST 07/28/2024 03:11:33 AM TECHNIQUE: CT of the head was performed without the administration of intravenous contrast. Automated exposure control, iterative reconstruction, and/or weight based adjustment of the mA/kV was utilized to reduce the radiation dose to as low as reasonably achievable. COMPARISON: 07/27/2024 at 8:32 PM CLINICAL HISTORY: Neuro deficit, acute, stroke suspected. FINDINGS: BRAIN AND VENTRICLES: Unchanged appearance of intraparenchymal hematoma centered in the right parietal lobe with 5 mm of leftward midline shift. Small amount of blood in the lateral ventricles is unchanged. Dilatation of the left lateral ventricle atrium and temporal horn are unchanged. ORBITS: No acute abnormality. SINUSES: No acute abnormality. SOFT TISSUES AND SKULL: No acute soft tissue abnormality. No skull fracture. IMPRESSION: 1. Unchanged appearance of intraparenchymal hematoma centered in the right parietal lobe with  5 mm of leftward midline shift. 2. Small amount of blood in the lateral ventricles is unchanged. 3. Dilatation of the left lateral ventricle atrium and temporal horn are unchanged. Electronically signed by: Franky Stanford MD 07/28/2024 03:24 AM EDT RP Workstation:  [id number]   DG CHEST PORT 1 VIEW Result Date: 07/28/2024 CLINICAL DATA:  Central line EXAM: PORTABLE CHEST 1 VIEW COMPARISON:  07/27/2024, 05/07/2023 FINDINGS: Endotracheal tube tip just above the carina. There is prominent inflation of the endotracheal tube cuff. Enteric tube tip below the diaphragm but incompletely visualized. New left IJ central venous catheter tip at the cavoatrial junction. Cardiomegaly with vascular congestion IMPRESSION: 1. Endotracheal tube tip just above the carina. There is prominent inflation of the endotracheal tube cuff. 2. New left IJ central venous catheter tip at the cavoatrial junction. No pneumothorax. 3. Cardiomegaly with vascular congestion. Electronically Signed   By: Luke Bun M.D.   On: 07/28/2024 01:00   CT HEAD WO CONTRAST ( ) Result Date: 07/27/2024 EXAM: CT HEAD WITHOUT 07/27/2024 08:38:38 PM TECHNIQUE: CT of the head was performed without the administration of intravenous contrast. Automated exposure control, iterative reconstruction, and/or weight based adjustment of the mA/kV was utilized to reduce the radiation dose to as low as reasonably achievable. COMPARISON: 07/27/24 5:35 PM CLINICAL HISTORY: Stroke, follow up. FINDINGS: BRAIN AND VENTRICLES: Large intraparenchymal hematoma centered in the right parietal lobe. Progressive edema surrounding the hemorrhage site with worsened mass effect on the right lateral ventricle. There is early evidence of left ventricular entrapment with a dilated atrium and left temporal horn. Leftward midline shift has worsened to 5 mm. The hemorrhage now measures 6.3 x 5.3 x 4.4 cm, previously 5.5 x 4.6 x 4.1 cm. ORBITS: No acute abnormality. SINUSES AND MASTOIDS: No acute abnormality. SOFT TISSUES AND SKULL: No acute skull fracture. No acute soft tissue abnormality. IMPRESSION: 1. Large intraparenchymal hematoma centered in the right parietal lobe, now measuring 6.3 x 5.3 x 4.4 cm (previously 5.5 x 4.6 x 4.1 cm), with progressive  edema and worsened mass effect on the right lateral ventricle. 2. Early evidence of left ventricular entrapment with a dilated atrium and left temporal horn. 3. Leftward midline shift has worsened to 5 mm. Electronically signed by: Franky Stanford MD 07/27/2024 08:49 PM EDT RP Workstation: [id number]   DG Chest Portable 1 View Result Date: 07/27/2024 CLINICAL DATA:  Endotracheal and nasogastric tube placements. EXAM: PORTABLE CHEST 1 VIEW COMPARISON:  05/07/2023. FINDINGS: Endotracheal tube terminates either 1.7 cm above the carina or at the orifice of the right mainstem bronchus. There is a double vertical linear density in the trachea, making exact assessment difficult. Nasogastric tube is followed into the stomach with the tip projecting beyond the inferior margin of the image. Heart is enlarged. Thoracic aorta is calcified. Diffuse interstitial prominence. IMPRESSION: 1. Double linear density in the trachea makes assessment for endotracheal tube position challenging. Recommend repeat chest radiograph with attention to tubes and lines in this area, to clear any possible external tubing that is complicating assessment. 2. Nasogastric tube terminates in the stomach. 3. Pulmonary edema. Electronically Signed   By: Newell Eke M.D.   On: 07/27/2024 20:00   CT HEAD CODE STROKE WO CONTRAST Result Date: 07/27/2024 EXAM: CT HEAD WITHOUT CONTRAST 07/27/2024 05:34:53 PM TECHNIQUE: CT of the head was performed without the administration of intravenous contrast. Automated exposure control, iterative reconstruction, and/or weight based adjustment of the mA/kV was utilized to reduce the radiation dose to as low as reasonably achievable. COMPARISON: None  available. CLINICAL HISTORY: Neuro deficit, acute, stroke suspected. After recheck for d/c w patient in [facility], she was found to have L facial droop w dysarthria and L UE weakness. Code Stroke activated. LSN [id number]. FINDINGS: BRAIN AND VENTRICLES: Large parenchymal hematoma  centered in the right frontal lobe measuring 5.5 x 4.6 x 4.1 cm. Surrounding edema with mass effect and sulcal effacement throughout the right cerebral hemisphere. There are few areas of subarachnoid hemorrhage over the right frontal lobe. There is partial effacement of the right lateral ventricle and partial effacement of the third ventricle. Approximately 6 mm leftward midline shift. Crowding of the right aspect of the suprasellar cistern. ORBITS: No acute abnormality. SINUSES: No acute abnormality. SOFT TISSUES AND SKULL: Acute subdural hematoma over the right frontoparietal convexity measuring up to 4 mm in thickness. Skull base atherosclerosis involving the carotid siphons and intracranial vertebral arteries. IMPRESSION: 1. Large right frontal parenchymal hematoma with surrounding edema, mass effect, and sulcal effacement. 6mm leftward midline shift. 2. Acute right frontoparietal convexity subdural hematoma measuring up to 4 mm in thickness. 3. Subarachnoid hemorrhage over the right frontal lobe. 4. Partial effacement of the right lateral ventricle and third ventricle. 5. Findings called to Dr. Garrick at 5:45 PM on 07/27/24. Electronically signed by: Donnice Mania MD 07/27/2024 05:48 PM EDT RP Workstation: [id number]   DG Tibia/Fibula Right Result Date: 07/27/2024 CLINICAL DATA:  wound, medial aspect, family request to eval for deep space infection EXAM: RIGHT TIBIA AND FIBULA - 2 VIEW COMPARISON:  None Available. FINDINGS: No acute fracture or dislocation. There is no evidence of arthropathy or other focal bone abnormality. Soft tissue swelling about the calf. Peripheral vascular atherosclerosis. IMPRESSION: Soft tissue swelling about the calf. No radiographic findings of osteomyelitis, at this time. Electronically Signed   By: Rogelia Myers M.D.   On: 07/27/2024 16:51     PHYSICAL EXAM General:  Well-nourished, well-developed intubated on sedation CV: Regular rate and rhythm on monitor Respiratory:   Regular, unlabored respirations on full vent support   NEURO: Intubated on sedation, rest with eyes closed and unable to respond to name or follow commands. Does not respond to tactile stimuli with lower limb posturing on noxius stimuli. Pupils are 2 mm H, midline, without sluggish reaction. No doll's eye.  Corneal reflex sluggish on the right and absent on the left. Unable to access gag reflex due to increased tone causing teeth to bite on NG tube. Increased tone noted in lower extremities.    Most Recent NIH  Dizziness Present: No (08/05 1400) Headache Present: No (08/05 1400) Interval: Other (Comment) (08/05 0700) Level of Consciousness (1a.)   : Not alert, requires repeated stimulation to attend, or is obtunded and requires strong or painful stimulation to make movements (not stereotyped) (08/05 1400) LOC Questions (1b. )   : Answers neither question correctly (08/05 1400) LOC Commands (1c. )   : Performs neither task correctly (08/05 1400) Best Gaze (2. )  : Normal (08/05 1400) Visual (3. )  : Bilateral hemianopia (blind including cortical blindness) (08/05 1400) Facial Palsy (4. )    : Normal symmetrical movements (08/05 1400) Motor Arm, Left (5a. )   : No effort against gravity (08/05 1400) Motor Arm, Right (5b. ) : Some effort against gravity (08/05 1400) Motor Leg, Left (6a. )  : Some effort against gravity (08/05 1400) Motor Leg, Right (6b. ) : Some effort against gravity (08/05 1400) Limb Ataxia (7. ): Absent (08/05 1400) Sensory (8. )  : Mild-to-moderate  sensory loss, patient feels pinprick is less sharp or is dull on the affected side, or there is a loss of superficial pain with pinprick, but patient is aware of being touched (08/05 1400) Best Language (9. )  : Mute, global aphasia (08/05 1400) Dysarthria (10. ): Intubated or other physical barrier (08/05 1400) Extinction/Inattention (11.)   : Profound hemi-inattention or extinction to more than one modality (08/05  1400) Complete NIHSS TOTAL: 24 (08/05 1400)    ASSESSMENT/PLAN  Ms. Patricia Wu is a 68 y.o. female with history of ESRD, atrial fibrillation on warfarin, hyperlipidemia, hypercholesterolemia, HTN, and medication noncompliance admitted for acute onset of left sided weakness, left facial droop, drooling, anfd dysarthria shortly before discharge of RLE cellulitis.  NIH on Admission 20. Presentation is complicated by elevated INR (1.6) despite Kcentra  and vitamin K . She remains intubated, sedated, and unresponsive, with increased tone and BLE posturing to noxious stimuli, and absent doll's eye, not tracking, with absent L corneal reflex and slight R corneal reflex. We will plan to place feeding tube, consult dietitian, remove foley and get UA, and recheck INR this evening.    ICH:  right brain large ICH with midline shift, etiology likely chronic A Fib on coumadin  with HTN and ESRD Code Stroke CT head large right frontal parenchymal hematoma with surrounding edema, mass effect, and sulcal effacement.  6 mm leftward midline shift.  SAH over the right frontal lobe.  Acute right frontoparietal subdural hematoma 4 mm.  Partial effacement of the right lateral ventricle and third ventricle. CTA head & neck no extravasation visualized within the R parietal hematoma.   CT repeat unchanged appearance of IPH in the right parietal lobe with 5 mm at leftward midline shift.  Dilation of the left lateral total ventricle atrium and temporal foreign are unchanged.  Blood in lateral ventricles is unchanged. MRI  pending 2D Echo LVEF is 50-55%, abnormal mitral valve, moderate calcification of the aortic valve LTM EEG moderate to severe encephalopathy, no seizure. LDL 108 HgbA1c pending UDS neg VTE prophylaxis - SCDs warfarin daily prior to admission, now on No antithrombotic due to IPH Therapy recommendations:  Pending Disposition:  pending  Cerebral edema CT showed 5-6 mm MLS MRI pending in am On 3%  saline @ 75 -> 23.4% Q6h PRN given ESRD on HD status Na 140-143-148-153 Na Q6h check NSG Dr. Gillie on board, no surgery at this time  Atrial fibrillation with RVR Home Meds: Coumadin   S/p Kcentra  and VitK INR 2.8->1.6->1.8 Another vitK 10mg  IV x 1 On ditalizem gtt for rate control No AC given ICH  Respiratory failure Intubated on vent Vent management per CCM On propofol   Hypertension Home meds:  metoprolol  tartate 100mg , metoprolol  tartate 25 mg Now on diltiazem  gtt Stable Off Levophed  Blood Pressure Goal: SBP less than 160   Hyperlipidemia Home meds: atorvastatin  10 mg LDL 108, goal < 70 Consider statin at discharge  Fever Leukocytosis Tmax 100.4--101.5 WBC 10.6--14.8 UA WBCs more than 50 CXR no pulmonary infection On Rocephin   Dysphagia Patient has post-stroke dysphagia No NPO Will need cortrak in am and consider TF  Other Stroke Risk Factors Obesity, Body mass index is 33.68 kg/m., BMI >/= 30 associated with increased stroke risk, recommend weight loss, diet and exercise as appropriate  Coronary artery disease  Other Active Problems ESRD on HD - nephrology on board CTA showed 4 mm posteriorly located aneurysm arising from the communicating segment of the R ICA - monitoring RLE open wound - wound  care consult  Hospital day # 1  Alan Maiden, MD PGY-1  ATTENDING NOTE: I reviewed above note and agree with the assessment and plan. Pt was seen and examined.   Husband and daughter are at the bedside. Pt is intubated on sedation, eyes closed, not open on vice. Not following commands. With forced eye opening, eyes in mid position, not blinking to visual threat, doll's eyes absent, not tracking, PERRL. Corneal reflex absent on the left, slightly present on the right, gag and cough not able to check due to biting to the ET. Breathing over the vent.  Facial symmetry not able to test due to ET tube.  Tongue protrusion not cooperative. On pain stimulation,  withdraw in all extremities, a little stronger on the right, however, with central pain stimulation pt extension posturing of BLEs. Sensation, coordination and gait not tested.   For detailed assessment and plan, please refer to above as I have made changes wherever appropriate.   Ary Cummins, MD PhD Stroke Neurology 07/28/2024 7:35 PM  This patient is critically ill due to large ICH, cerebral edema, respiratory failure, hypertensive emergency, ESRD on dialysis, A-fib RVR, fever and at significant risk of neurological worsening, death form brain herniation, brain death, heart failure, sepsis. This patient's care requires constant monitoring of vital signs, hemodynamics, respiratory and cardiac monitoring, review of multiple databases, neurological assessment, discussion with family, other specialists and medical decision making of high complexity. I spent 45 minutes of neurocritical care time in the care of this patient. I had long discussion with husband at bedside, updated pt current condition, treatment plan and potential poor prognosis, and answered all the questions.  He expressed understanding and appreciation.  Discussed with CCM Dr. Geronimo     To contact Stroke Continuity provider, please refer to [email or web address]. After hours, contact General Neurology

## 2024-07-28 NOTE — Progress Notes (Signed)
 New systolic goals is >90 SBP and <839

## 2024-07-28 NOTE — Progress Notes (Cosign Needed)
 LTM EEG hooked up and running - no initial skin breakdown - push button tested - [facility] monitoring.

## 2024-07-28 NOTE — Procedures (Signed)
 Continuous Video-EEG Monitoring Report  CPT/Type of Study: 95720 (EEG with video, 12-26 hours)       Indications for Procedure: Altered mental status  Primary neurological diagnosis: G93.40; I61.9  Duration of study: 07/27/2024 23:45 to 07/28/2024 12:45  History: This is a 68 year old female presenting with acute altered mental status and left-sided weakness in the context of large intraparenchymal hematoma centered in the right parietal lobe. Continuous video-EEG monitoring was performed to evaluate for seizure.   Technical Description: The EEG was performed using standard setting per the guidelines of American Clinical Neurophysiology Society (ACNS). A minimum of 21 electrodes were placed on scalp according to the International 10-20 or/and 10-10 Systems. Supplemental electrodes were placed as needed. Single EKG electrode was also used to detect cardiac arrhythmia. Patient's behavior was continuously recorded on video simultaneously with EEG. A minimum of 16 channels were used for data display. Each epoch of study was reviewed manually daily and as needed using standard referential and bipolar montages. Computerized quantitative EEG analysis (such as compressed spectral array analysis, trending, automated spike & seizure detection) were used as indicated.   EEG Description:  Epoch: 07/27/2024 23:45 to 07/28/2024 12:45  Background: There was generalized polymorphic delta and theta slowing. The polymorphic delta slowing was most prominent and persistent in the right frontotemporal region. No discernible posterior dominant rhythm or sleep architecture was recorded. Diffuse beta and spindle-like activity was present, most likely due to medication effect (propofol , benzodiazepine).   PERIODIC OR RHYTHMIC PATTERNS: None.  EPILEPTIFORM DISCHARGES: None.  SEIZURES: None.  EVENTS: None.  EKG: Atrial fibrillation was present.   Impression:  This is an abnormal EEG due to the presence of the  following: 1) Generalized polymorphic delta and theta slowing, suggesting moderate to severe encephalopathy, which is nonspecific as to etiology (medication effect of propofol  and benzodiazepine could not be ruled out); 2) Persistent focal polymorphic delta slowing in the right frontotemporal region, suggesting focal cerebral dysfunction or/and structural lesion. No epileptiform discharge or seizure is present.

## 2024-07-28 NOTE — Progress Notes (Signed)
 Pt receives OP HD at [facility], MWF, chair time 1015. Contacted clinic and notified that pt will not likely be at appts this week. Will assist as needed.   Patricia Wu Dialysis navigator [phone]

## 2024-07-28 NOTE — Progress Notes (Signed)
 33 Dr Michaela called this RN regarding status epilepticus interventions and instructed me to change Prop dose to 20 mcg/kg/min from 40 mcg/kg/min. I told him about the ordered 2345 f/u head CT not being done d/t central line placement. He instructed me to just get a f/u head CT 6hr after the last (9pm). 6hr post will be 0300, and he also wants a CT angio head at this time.

## 2024-07-28 NOTE — Progress Notes (Signed)
 Initial Nutrition Assessment  DOCUMENTATION CODES:   Not applicable  INTERVENTION:  Plans for Cortrak placement next date of service  When tube feeding indicated, recommend: Start Osmolite 1.5 at 55ml/hr and advance by 10ml q6h to goal rate of 29ml/hr ( per day) 60ml ProSource TF20 once daily Provides 1700 kcal, 87g protein, free water  daily  Consider adding Juven to support wound healing  NUTRITION DIAGNOSIS:   Inadequate oral intake related to acute illness as evidenced by NPO status.  GOAL:   Patient will meet greater than or equal to 90% of their needs  MONITOR:   Vent status, Labs, Weight trends, TF tolerance, I & O's, Skin  REASON FOR ASSESSMENT:   Consult, Ventilator Assessment of nutrition requirement/status  ASSESSMENT:   Pt transferred from [facility] with abrupt LOC and left facial droop, LUE weakness and code stroke found to have right parietal IPH. Initially admitted 8/4 for RLE wound associated pain and erythema. PMH significant for HTN, HLD, afib, ESRD on HD.   8/4: CT Head was obtained demonstrating large IPH in the R parietal lobe (6.3 x 5.3 x 4.4) with progressive edema and worsening mass effect of R lateral ventricle with early L ventricular entrapment and leftward midline shift of 5mm  Patient is currently intubated on ventilator support MV: 13.8 L/min Temp (24hrs), Avg:100.3 F (37.9 C), Min:99.6 F (37.6 C), Max:101.5 F (38.6 C) SBP goal <160  Now with seizure activity. On EEG.   Briefly spoke with patient's husband at bedside.  He states that she had been eating well.  No further nutrition related history provided at this time.   OGT in place. Per discussion with Neurology plan for Cortrak placement tomorrow. Hold on initiation of TF today with plan to start tomorrow.   EDW 84.5 kg Current weight: 89 kg  Medications: colace BID, miralax  daily Drips: Propofol  @ 10.6ml/hr  Sodium chloride  3% @ 13ml/hr  Labs:  Sodium 148 Cr  4.99 Anion gap 20 Phosphorus 6.1 GFR 9 CBG's 107, 93 x24 hours  NUTRITION - FOCUSED PHYSICAL EXAM:  Flowsheet Row Most Recent Value  Orbital Region Unable to assess  Upper Arm Region No depletion  Thoracic and Lumbar Region No depletion  Buccal Region Unable to assess  Temple Region Unable to assess  [EEG leads]  Clavicle Bone Region No depletion  Clavicle and Acromion Bone Region No depletion  Scapular Bone Region No depletion  Dorsal Hand Unable to assess  [mild edema]  Patellar Region No depletion  Anterior Thigh Region No depletion  Posterior Calf Region No depletion  Edema (RD Assessment) Mild  [generalized non-pitting edema]  Hair Reviewed  Eyes Unable to assess  Mouth Unable to assess  Skin Reviewed  Nails Reviewed    Diet Order:   Diet Order             Diet NPO time specified  Diet effective now                   EDUCATION NEEDS:   No education needs have been identified at this time  Skin:  Skin Assessment: Skin Integrity Issues: Skin Integrity Issues:: Other (Comment) Other: necrotic wound to left medial leg  Last BM:  unknown/PTA  Height:   Ht Readings from Last 1 Encounters:  07/27/24 5' 4 (1.626 m)    Weight:   Wt Readings from Last 1 Encounters:  07/28/24 89 kg    Ideal Body Weight:  54.5 kg  BMI:  Body mass index is  33.68 kg/m.  Estimated Nutritional Needs:   Kcal:  1600-1800  Protein:  80-95g  Fluid:  1 L + UOP   Royce Maris, RDN, LDN Clinical Nutrition See AMiON for contact information.

## 2024-07-28 NOTE — Progress Notes (Signed)
 Tried to coordinate 0600 MRI to be done at 0430. MRI tech said scan will have to be done on day shift tomorrow. Neuro MD, Jerri informed.  0240- 11 beat run of NSVT, CCM called and requested I draw AM labs early to assess the need for lyte replacement.

## 2024-07-28 NOTE — Progress Notes (Signed)
 PT Cancellation Note  Patient Details Name: Patricia Wu MRN: [id number] DOB: 12-Mar-1956   Cancelled Treatment:    Reason Eval/Treat Not Completed: Medical issues which prohibited therapy;Active bedrest order. Pt is on bedrest due to ICH and remains intubated and sedated. PT will follow up once the patient is off bedrest and medically appropriate to mobilize.   [name] 07/28/2024, 7:54 AM

## 2024-07-28 NOTE — Progress Notes (Signed)
Transported patient to C.T while patient was on the ventilator. Patient remained stable during transport.

## 2024-07-28 NOTE — Consult Note (Signed)
 NAME:  Patricia Wu, MRN:  [id number], DOB:  [birth year], LOS: 1 ADMISSION DATE:  07/27/2024 CONSULTATION DATE:  07/27/2024 REFERRING MD:  Michaela - Neuro, CHIEF COMPLAINT:  R parietal IPH   History of Present Illness:  68 year old woman who presented to [facility] 8/4 as a transfer from [facility] for management of R parietal IPH. PMHx significant for HTN, HLD, Afib (on Coumadin ), ESRD on HD (MWF via LUE AVF).   Initially presented to [facility] ED 8/4 for RLE wound with associated pain and erythema.  Staff at dialysis center reportedly noted worsening erythema of the wound from prior. Concern for cellulitis. On ED arrival, patient was afebrile with HR 99, BP 149/87, RR 16, SpO2 98% on RA. Labs were notable for WBC 10.6, Hgb 10.7, Plt 226. INR 2.8 (on Coumadin ). Na 142, K 3.0, CO2 27, BUN/Cr 17/3.98. Transaminases WNL, Alk Phos 514, Tbili 0.9. LA 1.9. XR R Tib/Fib demonstrated soft tissue swelling, no concern for osteomyelitis. Keflex  initiated.  Patient was being prepared for discharge when ~1710 when daughter called for RN; patient reportedly had abrupt change in LOC with L facial droop, LUE weakness, drooling/dysarthria. Code Stroke called with CT Head demonstrating large R frontal parenchymal hematoma with surrounding edema/mass effect and 6mm leftward midline shift as well as acute R frontoparietal SDH (4mm thickness) with associated SAH over R frontal lobe. Neuro and NSGY consulted. Kcentra  and Vitamin K  administered. Esmolol  gtt started for rate/BP control. Intubated for airway protection. Patient deemed appropriate for transfer to [facility].  On arrival to [facility], repeat CT Head was obtained demonstrating large IPH in the R parietal lobe (6.3 x 5.3 x 4.4) with progressive edema and worsening mass effect of R lateral ventricle with early L ventricular entrapment and leftward midline shift of 5mm. HTS 3% started per Neurology.  PCCM consulted for management of ventilator and hemodynamics.  Pertinent Medical History:    Past Medical History:  Diagnosis Date   A-fib (HCC)    AC with Coumadin    ESRD on dialysis (HCC)    MWF via LUE AVF   Hypercholesteremia    Hypertension    Noncompliance with medication regimen    Significant Hospital Events: Including procedures, antibiotic start and stop dates in addition to other pertinent events   8/4 - Presented to [facility] ED with RLE wound. C/f cellulitis. Keflex  started. Preparing for discharge when patient had abrupt change in consciousness with L-sided facial droop, LUE weakness, dysarthria. Code Stroke called. Large R parietal IPH on CT. Intubated. Transferred to [facility] for further management. Repeat CT with worsening edema/hemorrhage. PCCM consulted for vent/BP management.  Interim History / Subjective:   8/5 - febrile overnight to > 101. MRSA PCR neg. On vent. Fio2 40%. EEG ovenright c/w status. CT head . Unchanged appearance of intraparenchymal hematoma centered in the right parietal lobe with 5 mm of leftward midline shift. 2. Small amount of blood in the lateral ventricles is unchanged. 3. Dilatation of the left lateral ventricle atrium and temporal horn are unchanged. 4. No extravasation visualized within the right parietal hematoma. 2. 4 mm posteriorly directed aneurysm arising from the communicating segment of the right ICA.   On levophedf for SBP goal 130-150 On cardisem for a HR goal 65-105 On dipriva at 20 for seizure contril    Objective:  Blood pressure (!) 139/112, pulse (!) 101, temperature 99.7 F (37.6 C), resp. rate (!) 28, height 5' 4 (1.626 m), weight 89 kg, SpO2 96%.    Vent Mode: PRVC FiO2 (%):  [  40 %-100 %] 40 % Set Rate:  [15 bmp-18 bmp] 18 bmp Vt Set:  [400 mL-430 mL] 430 mL PEEP:  [5 cmH20] 5 cmH20 Plateau Pressure:  [14 cmH20-23 cmH20] 14 cmH20   Intake/Output Summary (Last 24 hours) at 07/28/2024 0943 Last data filed at 07/28/2024 0940 Gross per 24 hour  Intake 2136.92 ml  Output 46 ml  Net 2090.92 ml   Filed Weights    07/27/24 1344 07/28/24 0500  Weight: 84.6 kg 89 kg   Physical Examination:  General Appearance:  Looks criticall il Head:  Normocephalic, without obvious abnormality, atraumatic Eyes:  PERRL -yes, conjunctiva/corneas - muddu     Ears:  Normal external ear canals, both ears Nose:  G tube - x Throat:  ETT TUBE - yes , OG tube - yes Neck:  Supple,  No enlargement/tenderness/nodules Lungs: Clear to auscultation bilaterally, Ventilator   Synchrony - yes Heart:  S1 and S2 normal, no murmur, CVP - no.  Pressors - levophned Abdomen:  Soft, no masses, no organomegaly Genitalia / Rectal:  Not done Extremities:  Extremities- intact Skin:  ntact in exposed areas . Sacral area - not examin4 Neurologic:  Sedation - diprivan  gtt,  -> RASS - =3 . Moves all 4s - yes but weaker on left. CAM-ICU - cannot test . Orientation - NOT      Resolved Hospital Problem List:    Assessment & Plan:  Acute R parietal intraparenchymal hemorrhage with brain compression STatus epileticus 8/4 - 07/28/24 Initial CT Head demonstrating large R frontal parenchymal hematoma with surrounding edema/mass effect + 6mm leftward midline shift, acute R frontoparietal SDH (4mm thickness) with associated SAH over R frontal lobe. Repeat CT with large IPH in the R parietal lobe (6.3 x 5.3 x 4.4) with progressive edema and worsening mass effect of R lateral ventricle with early L ventricular entrapment and leftward midline shift of 5mm.   8/5 - ovenright had status  - Stroke team primary - Goal SBP 130-150 per Neuro - Diltiazem  titrated to goal SBP (given AFib RVR) - F/u MRI Brain - F/u Echo, lipid panel, A1c - Frequent neuro checks - Neuroprotective measures: HOB > 30 degrees, normoglycemia, normothermia, electrolytes WNL - EEG per neri - PT/OT/SLP when able to participate in care  Acute respiratory insufficiency in the setting of acute IPH - Requiring MV/airway protection.  07/28/2024 - > does NOT meet criteria for  SBT/Extubation in setting of Acute Respiratory Failure due to STAtus   PLAN - Continue full vent support (4-8cc/kg IBW) - Wean FiO2 for O2 sat > 90% - Daily WUA/SBT - VAP bundle - Pulmonary hygiene - PAD protocol for sedation: Fentanyl  for goal RASS 0 to -1  Atrial fibrillation on Coumadin  with RVR - AC contraindicated in the setting of IPH  PLAN - Diltiazem  gtt for rate control - Optimize electrolyes (K > 4, Mg > 2) - Cardiac monitoring  HTN HLD  PLAN - Hold home antihypertensives in the setting of gtt - Cardiac monitoring - Resume statin as clinically appropriate   ESRD on HD  PLAN - Nephro consult in AM for continuation of HD; Dr Geralynn called - Trend BMP - Replete electrolytes as indicated - Monitor I&Os - Avoid nephrotoxic agents as able   Cellulitis RLE prior to admit  8/5 - on bandage  Plan Anti-infectives (From admission, onward)    Start     Dose/Rate Route Frequency Ordered Stop   07/27/24 1630  cephALEXin  (KEFLEX ) capsule 500 mg  Status:  Discontinued        500 mg Oral  Once 07/27/24 1623 07/27/24 2224   07/27/24 1615  sulfamethoxazole -trimethoprim  (BACTRIM  DS) 800-160 MG per tablet 1 tablet  Status:  Discontinued        1 tablet Oral  Once 07/27/24 1600 07/27/24 1623   07/27/24 0000  cephALEXin  (KEFLEX ) 500 MG capsule        500 mg Oral 2 times daily 07/27/24 1623 08/01/24 2359        Best Practice: (right click and Reselect all SmartList Selections daily)   Diet/type: NPO DVT prophylaxis: SCD, otherwise contraindicated GI prophylaxis: PPI Lines: Central line, may require arterial line Foley:  Yes, and it is still needed Code Status:  full code Last date of multidisciplinary goals of care discussion [Per Primary Team]   Family 8/5 - husband and daughte rpdated at bedside   ATTESTATION & SIGNATURE   The patient Patricia Wu is critically ill with multiple organ systems failure and requires high complexity decision making for  assessment and support, frequent evaluation and titration of therapies, application of advanced monitoring technologies and extensive interpretation of multiple databases and discussion with other appropriate health care personnel such as bedside nurses, social workers, case Production designer, theatre/television/film, Pharmacologist, respiratory therapists, nutritionists, secretaries etc.,  Critical care time includes but is not restricted to just documentation time. Documentation can happen in parallel or sequential to care time depending on case mix urgency and priorities for the shift. So, overall critical Care Time devoted to patient care services described in this note is  30  Minutes.   This time reflects time of care of this signee Dr Dorethia Cave which includ does not reflect procedure time, or teaching time or supervisory time of PA/NP/Med student/Med Resident etc but could involve care discussion time     Dr. Dorethia Cave, M.D., [facility].C.P Pulmonary and Critical Care Medicine Staff Physician, [facility] Pager: [phone], If no answer or between  15:00h - 7:00h: call [phone]  07/28/2024 9:43 AM    LABS    PULMONARY Recent Labs  Lab 07/27/24 2203  PHART 7.270*  PCO2ART 55.8*  PO2ART 95  HCO3 25.5  TCO2 27  O2SAT 96    CBC Recent Labs  Lab 07/27/24 1455 07/27/24 2203 07/28/24 0408  HGB 10.7* 11.2* 9.3*  HCT 33.4* 33.0* 28.7*  WBC 10.6*  --  14.8*  PLT 226  --  212    COAGULATION Recent Labs  Lab 07/27/24 1455 07/28/24 0123 07/28/24 0408  INR 2.8* 1.6* 1.6*    CARDIAC  No results for input(s): TROPONINI in the last 168 hours. No results for input(s): PROBNP in the last 168 hours.   CHEMISTRY Recent Labs  Lab 07/27/24 1455 07/27/24 2203 07/28/24 0123 07/28/24 0408  NA 142 140 143 143  K 3.0* 4.0  --  3.8  CL 95*  --   --  101  CO2 27  --   --  22  GLUCOSE 93  --   --  135*  BUN 17  --   --  21  CREATININE 3.98*  --    --  4.99*  CALCIUM  9.2  --   --  8.2*  MG  --   --   --  2.0  PHOS  --   --   --  6.1*   Estimated Creatinine Clearance: 11.8 mL/min (A) (by C-G formula based on SCr of 4.99  mg/dL (H)).   LIVER Recent Labs  Lab 07/22/24 1524 07/27/24 1455 07/28/24 0123 07/28/24 0408  AST 24 21  --   --   ALT 19 22  --   --   ALKPHOS 557* 514*  --   --   BILITOT 0.8 0.9  --   --   PROT 8.3 7.8  --   --   ALBUMIN 4.1 3.3*  --   --   INR  --  2.8* 1.6* 1.6*     INFECTIOUS Recent Labs  Lab 07/27/24 1455  LATICACIDVEN 1.9     ENDOCRINE CBG (last 3)  Recent Labs    07/27/24 1723 07/28/24 0327  GLUCAP 93 107*         IMAGING x48h  - image(s) personally visualized  -   highlighted in bold CT ANGIO HEAD W OR WO CONTRAST Result Date: 07/28/2024 EXAM: CTA Head Without and With Intravenous Contrast CLINICAL HISTORY: Stroke, hemorrhagic; Assess for spot sign. CT HEAD POST STROKE FOLLOWUP/TIMED/STAT READ; Neuro deficit, acute, stroke suspected; CT ANGIO HEAD W OR WO CONTRAST; Stroke, hemorrhagic, Assess for spot sign TECHNIQUE: Axial CTA images of the head without and with intravenous contrast. MIP reconstructed images were created and reviewed. Dose reduction technique was used including one or more of the following: automated exposure control, adjustment of mA and kV according to patient size, and/or iterative reconstruction. CONTRAST: 75 mL iohexol  (OMNIPAQUE ) 350 MG/ML injection 75 mL IOHEXOL  350 MG/ML SOLN COMPARISON: 07/27/2024 FINDINGS: INTERNAL CAROTID ARTERIES: There is a 4 mm posteriorly directed aneurysm arising from the communicating segment of the right ICA. There is no extravasation visualized within the right parietal hematoma. ANTERIOR CEREBRAL ARTERIES: No significant stenosis. No occlusion. No aneurysm. MIDDLE CEREBRAL ARTERIES: No significant stenosis. No occlusion. No aneurysm. POSTERIOR CEREBRAL ARTERIES: No significant stenosis. No occlusion. No aneurysm. BASILAR ARTERY: No  significant stenosis. No occlusion. No aneurysm. VERTEBRAL ARTERIES: No significant stenosis. No occlusion. No aneurysm. SOFT TISSUES: No acute finding. No masses or lymphadenopathy. BONES: No acute osseous abnormality. IMPRESSION: 1. No extravasation visualized within the right parietal hematoma. 2. 4 mm posteriorly directed aneurysm arising from the communicating segment of the right ICA. Electronically signed by: Franky Stanford MD 07/28/2024 03:35 AM EDT RP Workstation: [id number]   CT HEAD POST STROKE FOLLOWUP/TIMED/STAT READ Result Date: 07/28/2024 EXAM: CT HEAD WITHOUT CONTRAST 07/28/2024 03:11:33 AM TECHNIQUE: CT of the head was performed without the administration of intravenous contrast. Automated exposure control, iterative reconstruction, and/or weight based adjustment of the mA/kV was utilized to reduce the radiation dose to as low as reasonably achievable. COMPARISON: 07/27/2024 at 8:32 PM CLINICAL HISTORY: Neuro deficit, acute, stroke suspected. FINDINGS: BRAIN AND VENTRICLES: Unchanged appearance of intraparenchymal hematoma centered in the right parietal lobe with 5 mm of leftward midline shift. Small amount of blood in the lateral ventricles is unchanged. Dilatation of the left lateral ventricle atrium and temporal horn are unchanged. ORBITS: No acute abnormality. SINUSES: No acute abnormality. SOFT TISSUES AND SKULL: No acute soft tissue abnormality. No skull fracture. IMPRESSION: 1. Unchanged appearance of intraparenchymal hematoma centered in the right parietal lobe with 5 mm of leftward midline shift. 2. Small amount of blood in the lateral ventricles is unchanged. 3. Dilatation of the left lateral ventricle atrium and temporal horn are unchanged. Electronically signed by: Franky Stanford MD 07/28/2024 03:24 AM EDT RP Workstation: [id number]   DG CHEST PORT 1 VIEW Result Date: 07/28/2024 CLINICAL DATA:  Central line EXAM: PORTABLE CHEST 1 VIEW COMPARISON:  07/27/2024,  05/07/2023 FINDINGS:  Endotracheal tube tip just above the carina. There is prominent inflation of the endotracheal tube cuff. Enteric tube tip below the diaphragm but incompletely visualized. New left IJ central venous catheter tip at the cavoatrial junction. Cardiomegaly with vascular congestion IMPRESSION: 1. Endotracheal tube tip just above the carina. There is prominent inflation of the endotracheal tube cuff. 2. New left IJ central venous catheter tip at the cavoatrial junction. No pneumothorax. 3. Cardiomegaly with vascular congestion. Electronically Signed   By: Luke Bun M.D.   On: 07/28/2024 01:00   CT HEAD WO CONTRAST ( ) Result Date: 07/27/2024 EXAM: CT HEAD WITHOUT 07/27/2024 08:38:38 PM TECHNIQUE: CT of the head was performed without the administration of intravenous contrast. Automated exposure control, iterative reconstruction, and/or weight based adjustment of the mA/kV was utilized to reduce the radiation dose to as low as reasonably achievable. COMPARISON: 07/27/24 5:35 PM CLINICAL HISTORY: Stroke, follow up. FINDINGS: BRAIN AND VENTRICLES: Large intraparenchymal hematoma centered in the right parietal lobe. Progressive edema surrounding the hemorrhage site with worsened mass effect on the right lateral ventricle. There is early evidence of left ventricular entrapment with a dilated atrium and left temporal horn. Leftward midline shift has worsened to 5 mm. The hemorrhage now measures 6.3 x 5.3 x 4.4 cm, previously 5.5 x 4.6 x 4.1 cm. ORBITS: No acute abnormality. SINUSES AND MASTOIDS: No acute abnormality. SOFT TISSUES AND SKULL: No acute skull fracture. No acute soft tissue abnormality. IMPRESSION: 1. Large intraparenchymal hematoma centered in the right parietal lobe, now measuring 6.3 x 5.3 x 4.4 cm (previously 5.5 x 4.6 x 4.1 cm), with progressive edema and worsened mass effect on the right lateral ventricle. 2. Early evidence of left ventricular entrapment with a dilated atrium and left temporal horn. 3.  Leftward midline shift has worsened to 5 mm. Electronically signed by: Franky Stanford MD 07/27/2024 08:49 PM EDT RP Workstation: [id number]   DG Chest Portable 1 View Result Date: 07/27/2024 CLINICAL DATA:  Endotracheal and nasogastric tube placements. EXAM: PORTABLE CHEST 1 VIEW COMPARISON:  05/07/2023. FINDINGS: Endotracheal tube terminates either 1.7 cm above the carina or at the orifice of the right mainstem bronchus. There is a double vertical linear density in the trachea, making exact assessment difficult. Nasogastric tube is followed into the stomach with the tip projecting beyond the inferior margin of the image. Heart is enlarged. Thoracic aorta is calcified. Diffuse interstitial prominence. IMPRESSION: 1. Double linear density in the trachea makes assessment for endotracheal tube position challenging. Recommend repeat chest radiograph with attention to tubes and lines in this area, to clear any possible external tubing that is complicating assessment. 2. Nasogastric tube terminates in the stomach. 3. Pulmonary edema. Electronically Signed   By: Newell Eke M.D.   On: 07/27/2024 20:00   CT HEAD CODE STROKE WO CONTRAST Result Date: 07/27/2024 EXAM: CT HEAD WITHOUT CONTRAST 07/27/2024 05:34:53 PM TECHNIQUE: CT of the head was performed without the administration of intravenous contrast. Automated exposure control, iterative reconstruction, and/or weight based adjustment of the mA/kV was utilized to reduce the radiation dose to as low as reasonably achievable. COMPARISON: None available. CLINICAL HISTORY: Neuro deficit, acute, stroke suspected. After recheck for d/c w patient in [facility], she was found to have L facial droop w dysarthria and L UE weakness. Code Stroke activated. LSN 1710. FINDINGS: BRAIN AND VENTRICLES: Large parenchymal hematoma centered in the right frontal lobe measuring 5.5 x 4.6 x 4.1 cm. Surrounding edema with mass effect and sulcal effacement throughout the right  cerebral hemisphere.  There are few areas of subarachnoid hemorrhage over the right frontal lobe. There is partial effacement of the right lateral ventricle and partial effacement of the third ventricle. Approximately 6 mm leftward midline shift. Crowding of the right aspect of the suprasellar cistern. ORBITS: No acute abnormality. SINUSES: No acute abnormality. SOFT TISSUES AND SKULL: Acute subdural hematoma over the right frontoparietal convexity measuring up to 4 mm in thickness. Skull base atherosclerosis involving the carotid siphons and intracranial vertebral arteries. IMPRESSION: 1. Large right frontal parenchymal hematoma with surrounding edema, mass effect, and sulcal effacement. 6mm leftward midline shift. 2. Acute right frontoparietal convexity subdural hematoma measuring up to 4 mm in thickness. 3. Subarachnoid hemorrhage over the right frontal lobe. 4. Partial effacement of the right lateral ventricle and third ventricle. 5. Findings called to Dr. Garrick at 5:45 PM on 07/27/24. Electronically signed by: Donnice Mania MD 07/27/2024 05:48 PM EDT RP Workstation: [id number]   DG Tibia/Fibula Right Result Date: 07/27/2024 CLINICAL DATA:  wound, medial aspect, family request to eval for deep space infection EXAM: RIGHT TIBIA AND FIBULA - 2 VIEW COMPARISON:  None Available. FINDINGS: No acute fracture or dislocation. There is no evidence of arthropathy or other focal bone abnormality. Soft tissue swelling about the calf. Peripheral vascular atherosclerosis. IMPRESSION: Soft tissue swelling about the calf. No radiographic findings of osteomyelitis, at this time. Electronically Signed   By: Rogelia Myers M.D.   On: 07/27/2024 16:51

## 2024-07-28 NOTE — Progress Notes (Signed)
 L internal jugular CL ok to use per CCM ground. Blood return on all lumens

## 2024-07-28 NOTE — TOC Initial Note (Signed)
 Transition of Care [facility]) - Initial/Assessment Note    Patient Details  Name: Patricia Wu MRN: [id number] Date of Birth: 11/10/1956  [facility]) CM/SW Contact:    Marval Gell, RN Phone Number: 07/28/2024, 3:22 PM  Clinical Narrative:                 Per chart review.  Patient being seen in ED for cellulitis when she had a stroke just before DC.  ICM will continue to follow for disposition as patient progresses.   Expected Discharge Plan:  (TBD) Barriers to Discharge: Continued Medical Work up   Patient Goals and CMS Choice            Expected Discharge Plan and Services                                              Prior Living Arrangements/Services                       Activities of Daily Living      Permission Sought/Granted                  Emotional Assessment              Admission diagnosis:  Hemorrhagic stroke [facility]) [I61.9] CVA (cerebrovascular accident due to intracerebral hemorrhage) (HCC) [I61.9] ICH (intracerebral hemorrhage) (HCC) [I61.9] Cellulitis of right lower extremity [L03.115] Patient Active Problem List   Diagnosis Date Noted   Hemorrhagic stroke (HCC) 07/28/2024   CVA (cerebrovascular accident due to intracerebral hemorrhage) (HCC) 07/27/2024   ICH (intracerebral hemorrhage) (HCC) 07/27/2024   Class 2 obesity due to excess calories with body mass index (BMI) of 39.0 to 39.9 in adult 05/10/2023   Acute on chronic combined systolic and diastolic CHF (congestive heart failure) (HCC) 05/09/2023   Hypervolemia associated with renal insufficiency 05/07/2023   ESRD needing dialysis (HCC) 05/07/2023   History of Coumadin  therapy 03/26/2023   Atrial fibrillation, controlled (HCC) 03/04/2023   Asthma, chronic 03/04/2023   Chronic kidney disease, stage IV (severe) (HCC) 02/18/2023   Anemia of chronic renal failure 02/18/2023   Acute-on-chronic kidney injury (HCC) 02/15/2022   Hyperlipidemia  02/15/2022   Obesity (BMI 30-39.9) 02/15/2022   Colon cancer screening 09/21/2020   Elevated alkaline phosphatase level 09/21/2020   Essential hypertension 02/18/2018   Elevated brain natriuretic peptide (BNP) level 02/18/2018   CKD (chronic kidney disease) stage 3, GFR 30-59 ml/min (HCC) 02/18/2018   PCP:  Levora Reyes SAUNDERS, MD Pharmacy:   [phone] - [place], [place] - [address] AT SEC OF [address] [place] [address] Phone: [phone] Fax: [phone]     Social Drivers of Health (SDOH) Social History: SDOH Screenings   Food Insecurity: No Food Insecurity (07/28/2024)  Housing: Unknown (07/28/2024)  Transportation Needs: No Transportation Needs (12/03/2023)  Utilities: Not At Risk (12/03/2023)  Alcohol  Screen: Low Risk  (12/03/2023)  Depression (PHQ2-9): Low Risk  (07/22/2024)  Financial Resource Strain: Low Risk  (12/03/2023)  Physical Activity: Insufficiently Active (12/03/2023)  Social Connections: Moderately Integrated (12/03/2023)  Stress: No Stress Concern Present (12/03/2023)  Tobacco Use: Low Risk  (07/27/2024)  Health Literacy: Adequate Health Literacy (12/03/2023)   SDOH Interventions:     Readmission Risk Interventions    05/08/2023  1:15 PM  Readmission Risk Prevention Plan  Transportation Screening Complete  Home Care Screening Complete  Medication Review (RN CM) Complete

## 2024-07-28 NOTE — Consult Note (Addendum)
 Patricia Service Consult Note [facility]  Patricia Wu 07/28/2024 [name], Patricia Requesting Physician: Dr. Geronimo  Reason for Consult: ESRD pt w/ intracerebral bleed HPI: The patient is a 68 y.o. year-old w/ PMH as below who presented to [facility] ED after dialysis for evaluation of a wound on her R lower leg. They did Wu some mild cellulitis around a slowly healing wound, but no sepsis was suspected. They were preparing to discharge the patient when she had an acute loss of consciousness w/ LUE weakness and drooling/ dysarthria. Imaging showed an acute IC bleed. Pt was given Kcentra  for coumadin  reversal and pt was intubated and transferred to ICU at [facility]. On arrival BP 140/96, HR 101, RR 17-23, temp 99.6 deg F. We are asked to see for esrd.     Pt seen in ICU, pt on vent and sedated. Head CT showing large R IC bleed w/ R to L shift. Pt is getting 3% saline at 75 cc/hr. Was on some low dose levo gtt earlier today but is now off pressors. Getting IV keppra , PPI and IV cardizem  gtt.    ROS - n/a   Past Medical History  Past Medical History:  Diagnosis Date   A-fib (HCC)    AC with Coumadin    ESRD on dialysis (HCC)    MWF via LUE AVF   Hypercholesteremia    Hypertension    Noncompliance with medication regimen    Past Surgical History  Past Surgical History:  Procedure Laterality Date   A/V FISTULAGRAM Left 08/15/2023   Procedure: A/V Fistulagram;  Surgeon: Gretta Lonni PARAS, Patricia;  Location: [facility] INVASIVE CV LAB;  Service: Cardiovascular;  Laterality: Left;   A/V SHUNT INTERVENTION Left 07/08/2024   Procedure: A/V SHUNT INTERVENTION;  Surgeon: Pearline Norman RAMAN, Patricia;  Location: [facility] PV LAB;  Service: Cardiovascular;  Laterality: Left;   AV FISTULA PLACEMENT Left 06/18/2023   Procedure: INSERTION OF LEFT ARM ARTERIOVENOUS (AV) FISTULA;  Surgeon: Oris Krystal FALCON, Patricia;  Location: [facility] ORS;  Service: Vascular;  Laterality: Left;   COLONOSCOPY WITH PROPOFOL  N/A 10/17/2020    Procedure: COLONOSCOPY WITH PROPOFOL ;  Surgeon: Cindie Carlin POUR, DO;  Location: [facility] ENDO SUITE;  Service: Endoscopy;  Laterality: N/A;  1:00pm   IR FLUORO GUIDE CV LINE RIGHT  05/09/2023   IR REMOVAL TUN CV CATH W/O FL  01/28/2024   IR US  GUIDE VASC ACCESS RIGHT  05/10/2023   POLYPECTOMY  10/17/2020   Procedure: POLYPECTOMY;  Surgeon: Cindie Carlin POUR, DO;  Location: [facility];  Service: Endoscopy;;   VENOUS ANGIOPLASTY  07/08/2024   Procedure: VENOUS ANGIOPLASTY;  Surgeon: Pearline Norman RAMAN, Patricia;  Location: [facility] PV LAB;  Service: Cardiovascular;;   Family History  Family History  Problem Relation Age of Onset   Liver cancer Neg Hx    Colon cancer Neg Hx    Social History  reports that she has never smoked. She has never been exposed to tobacco smoke. She has never used smokeless tobacco. She reports that she does not drink alcohol  and does not use drugs. Allergies  Allergies  Allergen Reactions   Ibuprofen Other (See Comments)    PCP said raises her BP   Home medications Prior to Admission medications   Medication Sig Start Date End Date Taking? Authorizing Provider  cephALEXin  (KEFLEX ) 500 MG capsule Take 1 capsule (500 mg total) by mouth 2 (two) times daily for 5 days. 07/27/24 08/01/24 Yes Garrick Lamar, Patricia  acetaminophen  (TYLENOL ) 325  MG tablet Take 325-650 mg by mouth every 6 (six) hours as needed (pain.).     Provider, Historical, Patricia  albuterol  (VENTOLIN  HFA) 108 (90 Base) MCG/ACT inhaler Inhale 1-2 puffs into the lungs every 6 (six) hours as needed for wheezing or shortness of breath. 07/18/23   Levora Reyes SAUNDERS, Patricia  atorvastatin  (LIPITOR) 10 MG tablet TAKE 1 TABLET(10 MG) BY MOUTH DAILY 07/22/24   Levora Reyes SAUNDERS, Patricia  B Complex-C-Folic Acid  (RENA-VITE RX) 1 MG TABS Take 1 tablet by mouth daily. 08/29/23   Provider, Historical, Patricia  calcitRIOL  (ROCALTROL ) 0.25 MCG capsule Take 0.5 mcg by mouth daily.    Provider, Historical, Patricia  calcium  acetate (PHOSLO) 667 MG capsule Take 1,334 mg  by mouth 3 (three) times daily. 09/25/23   Provider, Historical, Patricia  furosemide  (LASIX ) 40 MG tablet Take 1 tablet (40 mg total) by mouth as needed for edema (swelling). 08/05/23   Miriam Norris, NP  irbesartan  (AVAPRO ) 150 MG tablet Take 1 tablet (150 mg total) by mouth daily. 01/02/24   Miriam Norris, NP  metoprolol  tartrate (LOPRESSOR ) 100 MG tablet Take 1 tablet (100 mg total) by mouth 2 (two) times daily. 01/02/24   Miriam Norris, NP  metoprolol  tartrate (LOPRESSOR ) 25 MG tablet Take 1 tablet (25 mg total) by mouth 2 (two) times daily. 01/02/24   Miriam Norris, NP  oxyCODONE -acetaminophen  (PERCOCET) 5-325 MG tablet Take 1 tablet by mouth every 6 (six) hours as needed for severe pain. 06/18/23   Oris Krystal FALCON, Patricia  warfarin (COUMADIN ) 2.5 MG tablet TAKE 2 TABLETS TO 3 TABLETS BY MOUTH DAILY AS DIRECTED BY COUMADIN  CLINIC 07/02/24   Mallipeddi, Vishnu P, Patricia     Vitals:   07/28/24 1130 07/28/24 1200 07/28/24 1300 07/28/24 1400  BP: (!) 127/58 113/65 104/62 (!) 106/56  Pulse:      Resp: (!) 23 (!) 28 (!) 22 (!) 26  Temp: (!) 100.6 F (38.1 C) (!) 100.6 F (38.1 C) (!) 100.8 F (38.2 C) (!) 100.8 F (38.2 C)  TempSrc:      SpO2:      Weight:      Height:       Exam Gen on vent, sedated No rash, cyanosis or gangrene Sclera anicteric, throat w/ ETT No jvd or bruits Chest clear anterior/ lateral RRR no MRG Abd soft ntnd no mass or ascites +bs GU normal MS no joint effusions or deformity Ext no LE edema, no wounds or ulcers Neuro is on vent, sedated    LUA AVF+bruit   Home bp meds: Irbesartan  150 every day Lopressor  100 bid Lopressor  25 bid Lasix  40mg  every day prn edema    OP HD: MWF [facility]   3.5h   84.5kg  B400   AVF   Heparin  none Mircera 30 micrograms on 8/02 Sensipar 30mg  three times per week Sod thiosulfate recently started for leg wounds  CXR 8/05 - vasc congestion, no edema K+ 3.8, bun 21, creat 4.99  Assessment/ Plan: IC bleed: w/ midline shift on  imaging. Getting 3% saline at 75 cc/hr. Per neuro/ CCM.  Seizures/ status epilepticus: per neuro/ CCM ESRD: on HD MWF. Had HD yesterday. Next HD Wed w/ highest Na+ setting (= 145) while on 3% saline.  HTN: bp's are low normal, defer BP control to ICU / neuro Volume: euvolemic on exam, 4 kg over dry wt, goal UF 2 L w/ HD. CXR w/ vasc congestion.  Anemia of esrd: Hb 10-11 here, follow, transfuse prn.  Afib -  on IV diltiazem  for rate control   Rob Dyani Babel  Patricia CKA 07/28/2024, 2:19 PM  Recent Labs  Lab 07/22/24 1524 07/27/24 1455 07/27/24 1455 07/27/24 2203 07/28/24 0408  HGB  --  10.7*   < > 11.2* 9.3*  ALBUMIN 4.1 3.3*  --   --   --   CALCIUM   --  9.2  --   --  8.2*  PHOS  --   --   --   --  6.1*  CREATININE  --  3.98*  --   --  4.99*  K  --  3.0*   < > 4.0 3.8   < > = values in this interval not displayed.   Inpatient medications:  Chlorhexidine  Gluconate Cloth  6 each Topical Daily   docusate  100 mg Per Tube BID   levETIRAcetam   500 mg Intravenous Q12H   mouth rinse  15 mL Mouth Rinse Q2H   pantoprazole  (PROTONIX ) IV  40 mg Intravenous QHS   polyethylene glycol  17 g Per Tube Daily    diltiazem  (CARDIZEM ) 125 mg in sodium chloride  0.9% 125 mL (1mg /mL) infusion     norepinephrine  (LEVOPHED ) Adult infusion Stopped (07/28/24 1132)   propofol  (DIPRIVAN ) infusion 20 mcg/kg/min (07/28/24 1300)   sodium chloride  (hypertonic) 75 mL/hr at 07/28/24 1300   acetaminophen  **OR** acetaminophen  (TYLENOL ) oral liquid 160 mg/5 mL **OR** acetaminophen , fentaNYL  (SUBLIMAZE ) injection, fentaNYL  (SUBLIMAZE ) injection, labetalol , mouth rinse

## 2024-07-29 ENCOUNTER — Encounter (HOSPITAL_COMMUNITY): Payer: Self-pay | Admitting: Radiology

## 2024-07-29 ENCOUNTER — Inpatient Hospital Stay (HOSPITAL_COMMUNITY)

## 2024-07-29 ENCOUNTER — Ambulatory Visit: Admitting: Internal Medicine

## 2024-07-29 DIAGNOSIS — G935 Compression of brain: Secondary | ICD-10-CM | POA: Diagnosis not present

## 2024-07-29 DIAGNOSIS — N186 End stage renal disease: Secondary | ICD-10-CM | POA: Diagnosis not present

## 2024-07-29 DIAGNOSIS — I611 Nontraumatic intracerebral hemorrhage in hemisphere, cortical: Secondary | ICD-10-CM | POA: Diagnosis not present

## 2024-07-29 DIAGNOSIS — D649 Anemia, unspecified: Secondary | ICD-10-CM

## 2024-07-29 DIAGNOSIS — R29726 NIHSS score 26: Secondary | ICD-10-CM | POA: Diagnosis not present

## 2024-07-29 DIAGNOSIS — I619 Nontraumatic intracerebral hemorrhage, unspecified: Secondary | ICD-10-CM

## 2024-07-29 DIAGNOSIS — J9600 Acute respiratory failure, unspecified whether with hypoxia or hypercapnia: Secondary | ICD-10-CM | POA: Diagnosis not present

## 2024-07-29 DIAGNOSIS — I4891 Unspecified atrial fibrillation: Secondary | ICD-10-CM | POA: Diagnosis not present

## 2024-07-29 DIAGNOSIS — I609 Nontraumatic subarachnoid hemorrhage, unspecified: Secondary | ICD-10-CM | POA: Diagnosis not present

## 2024-07-29 DIAGNOSIS — I482 Chronic atrial fibrillation, unspecified: Secondary | ICD-10-CM | POA: Diagnosis not present

## 2024-07-29 DIAGNOSIS — Z992 Dependence on renal dialysis: Secondary | ICD-10-CM | POA: Diagnosis not present

## 2024-07-29 LAB — SODIUM
Sodium: 153 mmol/L — ABNORMAL HIGH (ref 135–145)
Sodium: 153 mmol/L — ABNORMAL HIGH (ref 135–145)
Sodium: 146 mmol/L — ABNORMAL HIGH (ref 135–145)

## 2024-07-29 LAB — BASIC METABOLIC PANEL WITH GFR
Anion gap: 21 — ABNORMAL HIGH (ref 5–15)
BUN: 33 mg/dL — ABNORMAL HIGH (ref 8–23)
CO2: 17 mmol/L — ABNORMAL LOW (ref 22–32)
Calcium: 8.8 mg/dL — ABNORMAL LOW (ref 8.9–10.3)
Chloride: 114 mmol/L — ABNORMAL HIGH (ref 98–111)
Creatinine, Ser: 6.63 mg/dL — ABNORMAL HIGH (ref 0.44–1.00)
GFR, Estimated: 6 mL/min — ABNORMAL LOW (ref >60)
Glucose, Bld: 145 mg/dL — ABNORMAL HIGH (ref 70–99)
Potassium: 4.1 mmol/L (ref 3.5–5.1)
Sodium: 152 mmol/L — ABNORMAL HIGH (ref 135–145)

## 2024-07-29 LAB — CBC
HCT: 29.7 % — ABNORMAL LOW (ref 36.0–46.0)
Hemoglobin: 9.2 g/dL — ABNORMAL LOW (ref 12.0–15.0)
MCH: 30.6 pg (ref 26.0–34.0)
MCHC: 31 g/dL (ref 30.0–36.0)
MCV: 98.7 fL (ref 80.0–100.0)
Platelets: 218 K/uL (ref 150–400)
RBC: 3.01 MIL/uL — ABNORMAL LOW (ref 3.87–5.11)
RDW: 14.9 % (ref 11.5–15.5)
WBC: 18.2 K/uL — ABNORMAL HIGH (ref 4.0–10.5)
nRBC: 0 % (ref 0.0–0.2)

## 2024-07-29 LAB — HEPATITIS B SURFACE ANTIBODY, QUANTITATIVE: Hep B S AB Quant (Post): <3.5 m[IU]/mL — ABNORMAL LOW

## 2024-07-29 LAB — GLUCOSE, CAPILLARY
Glucose-Capillary: 109 mg/dL — ABNORMAL HIGH (ref 70–99)
Glucose-Capillary: 109 mg/dL — ABNORMAL HIGH (ref 70–99)
Glucose-Capillary: 133 mg/dL — ABNORMAL HIGH (ref 70–99)

## 2024-07-29 LAB — MAGNESIUM
Magnesium: 2.2 mg/dL (ref 1.7–2.4)
Magnesium: 2.2 mg/dL (ref 1.7–2.4)

## 2024-07-29 LAB — PHOSPHORUS: Phosphorus: 6.8 mg/dL — ABNORMAL HIGH (ref 2.5–4.6)

## 2024-07-29 LAB — PROTIME-INR
INR: 1.7 — ABNORMAL HIGH (ref 0.8–1.2)
Prothrombin Time: 20.5 s — ABNORMAL HIGH (ref 11.4–15.2)

## 2024-07-29 LAB — LACTIC ACID, PLASMA: Lactic Acid, Venous: 1.9 mmol/L (ref 0.5–1.9)

## 2024-07-29 MED ORDER — OSMOLITE 1.5 CAL PO LIQD
1000.0000 mL | ORAL | Status: DC
  Administered 2024-07-29: 1000 mL

## 2024-07-29 MED ORDER — ATORVASTATIN CALCIUM 10 MG PO TABS
10.0000 mg | ORAL_TABLET | Freq: Every day | ORAL | Status: DC
  Administered 2024-07-29 – 2024-07-30 (×2): 10 mg
  Filled 2024-07-29 (×2): qty 1

## 2024-07-29 MED ORDER — MEDIHONEY WOUND/BURN DRESSING EX PSTE
1.0000 | PASTE | Freq: Every day | CUTANEOUS | Status: DC
  Administered 2024-07-29 – 2024-07-30 (×2): 1 via TOPICAL
  Filled 2024-07-29: qty 44

## 2024-07-29 MED ORDER — METOPROLOL TARTRATE 25 MG PO TABS
25.0000 mg | ORAL_TABLET | Freq: Three times a day (TID) | ORAL | Status: DC
  Administered 2024-07-29 (×2): 25 mg
  Filled 2024-07-29 (×2): qty 1

## 2024-07-29 MED ORDER — OSMOLITE 1.5 CAL PO LIQD: 1000.0000 mL | ORAL | Status: DC

## 2024-07-29 MED ORDER — VITAL HP 1.0 CAL PO LIQD: 1000.0000 mL | ORAL | Status: DC

## 2024-07-29 MED ORDER — METOPROLOL TARTRATE 5 MG/5ML IV SOLN
5.0000 mg | INTRAVENOUS | Status: AC | PRN
  Administered 2024-07-29 (×3): 5 mg via INTRAVENOUS
  Filled 2024-07-29 (×3): qty 5

## 2024-07-29 MED ORDER — PROSOURCE TF20 ENFIT COMPATIBL EN LIQD
60.0000 mL | Freq: Every day | ENTERAL | Status: DC
  Administered 2024-07-29 – 2024-07-30 (×2): 60 mL
  Filled 2024-07-29 (×2): qty 60

## 2024-07-29 NOTE — Progress Notes (Signed)
 SLP Cancellation Note  Patient Details Name: Patricia Wu MRN: [id number] DOB: 1956-07-30   Cancelled treatment:  Medical issues which prohibited therapy; noted continued progression of edema and now 1cm L shift.  Remains on vent. Will sign off and anticipate new SLP order when appropriate.  Siri Buege L. Vona, MA CCC/SLP Clinical Specialist - Acute Care SLP Acute Rehabilitation Services Office number [phone]   Vona Palma Laurice 07/29/2024, 1:55 PM

## 2024-07-29 NOTE — Progress Notes (Addendum)
 STROKE TEAM PROGRESS NOTE    SIGNIFICANT HOSPITAL EVENTS 8/4 - Presented to [facility] ED with RLE wound. C/f cellulitis. Keflex  started. Preparing for discharge when patient had abrupt change in consciousness with L-sided facial droop, LUE weakness, dysarthria. Code Stroke called. Large R parietal IPH on CT. Intubated. Transferred to [facility] for further management. Repeat CT with worsening edema/hemorrhage. PCCM consulted for vent/BP management.   SUBJECTIVE (INTERIM HISTORY) Patient continues to deteriorate, with presentation worsening compared to yesterday. Patient is no longer posturing, with a complete lack of response on in left extremities and no L corneal reflex. Patient has overall decreased tone and we are now able to elicit a gag reflex. Palliative is planning to come by and meet with the family today. We will plan to turn off sedation and monitor responsiveness.    OBJECTIVE Vitals:   07/29/24 1100 07/29/24 1130 07/29/24 1154 07/29/24 1155  BP: (!) 93/57 (!) 108/59    Pulse: 74 72 74   Resp: (!) 25 (!) 36 (!) 26   Temp: 98.6 F (37 C) 98.8 F (37.1 C) 99 F (37.2 C)   TempSrc:      SpO2: 100% 100% 100% 100%  Weight:      Height:        CBC    Component Value Date/Time   WBC 18.2 (H) 07/29/2024 0257   RBC 3.01 (L) 07/29/2024 0257   HGB 9.2 (L) 07/29/2024 0257   HCT 29.7 (L) 07/29/2024 0257   PLT 218 07/29/2024 0257   MCV 98.7 07/29/2024 0257   MCV 88.2 05/05/2014 0849   MCH 30.6 07/29/2024 0257   MCHC 31.0 07/29/2024 0257   RDW 14.9 07/29/2024 0257   LYMPHSABS 2.1 07/27/2024 1455   MONOABS 0.9 07/27/2024 1455   EOSABS 0.4 07/27/2024 1455   BASOSABS 0.0 07/27/2024 1455    BMET    Component Value Date/Time   NA 153 (H) 07/29/2024 0926   NA 146 (H) 02/08/2021 0950   K 4.1 07/29/2024 0257   CL 114 (H) 07/29/2024 0257   CO2 17 (L) 07/29/2024 0257   GLUCOSE 145 (H) 07/29/2024 0257   BUN 33 (H) 07/29/2024 0257   BUN 28 (H) 02/08/2021 0950   CREATININE 6.63 (H)  07/29/2024 0257   CREATININE 1.14 (H) 05/05/2014 0846   CALCIUM  8.8 (L) 07/29/2024 0257   GFRNONAA 6 (L) 07/29/2024 0257    IMAGING past 24 hours Discontinue Long Term Monitor EEG Result Date: 07/29/2024 Maurine Jurist, MD     07/29/2024 10:45 AM Continuous Video-EEG Monitoring Report CPT/Type of Study: 95720 (EEG with video, 12-26 hours)      Indications for Procedure: Altered mental status Primary neurological diagnosis: G93.40; I61.9 Duration of study: 07/28/2024 12:45 to 07/29/2024 07:53 History: This is a 68 year old female presenting with acute altered mental status and left-sided weakness in the context of large intraparenchymal hematoma centered in the right parietal lobe. Continuous video-EEG monitoring was performed to evaluate for seizure. Technical Description: The EEG was performed using standard setting per the guidelines of American Clinical Neurophysiology Society (ACNS). A minimum of 21 electrodes were placed on scalp according to the International 10-20 or/and 10-10 Systems. Supplemental electrodes were placed as needed. Single EKG electrode was also used to detect cardiac arrhythmia. Patient's behavior was continuously recorded on video simultaneously with EEG. A minimum of 16 channels were used for data display. Each epoch of study was reviewed manually daily and as needed using standard referential and bipolar montages. Computerized quantitative EEG analysis (such as  compressed spectral array analysis, trending, automated spike & seizure detection) were used as indicated. EEG Description: Epoch: 07/28/2024 12:45 to 07/29/2024 07:53 Background: There was generalized polymorphic delta and theta slowing. The polymorphic delta slowing was most prominent and persistent in the right frontotemporal region. No discernible posterior dominant rhythm or sleep architecture was recorded. Diffuse beta and spindle-like activity was present, most likely due to medication effect (propofol ). PERIODIC OR RHYTHMIC  PATTERNS: None. EPILEPTIFORM DISCHARGES: None. SEIZURES: None. EVENTS: None. EKG: Atrial fibrillation was present. NOTE: Video-EEG monitoring was interrupted from 07:12 to 07:30 on 07/29/2024. Impression: This is an abnormal EEG due to the presence of the following: 1) Generalized polymorphic delta and theta slowing, suggesting moderate to severe encephalopathy, which is nonspecific as to etiology (medication effect of propofol  could not be ruled out); 2) Persistent focal polymorphic delta slowing in the right frontotemporal region, suggesting focal cerebral dysfunction or/and structural lesion. No epileptiform discharge or seizure is present. Compared with the last epoch, this epoch shows no significant change.    MR BRAIN WO CONTRAST Result Date: 07/29/2024 CLINICAL DATA:  Hemorrhagic infarct EXAM: MRI HEAD WITHOUT CONTRAST TECHNIQUE: Multiplanar, multiecho pulse sequences of the brain and surrounding structures were obtained without intravenous contrast. COMPARISON:  CT head July 28, 2024 FINDINGS: MRI brain: There is a 5.6 x 5.6 x 4.5 cm right frontoparietal hematoma with surrounding edema. There is 1 cm of right to left midline shift with effacement of the right lateral ventricle. Small amount of intraventricular hemorrhage. No larger ischemic infarct visualized No mass lesion. There are normal flow signals in the carotid arteries and basilar artery. No significant bone marrow signal abnormality. No significant abnormality in the paranasal sinuses or soft tissues. IMPRESSION: 5.6 x 5.6 x 4.5 cm right frontal parietal hematoma with surrounding edema, effacement of the right lateral ventricle and 1 cm of midline shift Electronically Signed   By: Nancyann Burns M.D.   On: 07/29/2024 08:58   ECHOCARDIOGRAM COMPLETE Result Date: 07/28/2024    ECHOCARDIOGRAM REPORT   Patient Name:   Patricia Wu Date of Exam: 07/28/2024 Medical Rec #:  [id number]       Height:       64.0 in Accession #:    [id number]      Weight:        196.2 lb Date of Birth:  April 10, 1956      BSA:          1.941 m Patient Age:    68 years        BP:           103/63 mmHg Patient Gender: F               HR:           70 bpm. Exam Location:  Inpatient Procedure: 2D Echo, Cardiac Doppler and Color Doppler (Both Spectral and Color            Flow Doppler were utilized during procedure). Indications:    Stroke  History:        Patient has prior history of Echocardiogram examinations, most                 recent 03/05/2023. Arrythmias:Atrial Fibrillation; Risk                 Factors:Hypertension.  Sonographer:    Jayson Gaskins Referring Phys: [phone] MCNEILL P KIRKPATRICK IMPRESSIONS  1. Left ventricular ejection fraction, by estimation, is 50 to 55%. The left ventricle  has low normal function. The left ventricle has no regional wall motion abnormalities. Left ventricular diastolic parameters are indeterminate.  2. Right ventricular systolic function is normal. The right ventricular size is mildly enlarged. Moderately increased right ventricular wall thickness.  3. Right atrial size was mildly dilated.  4. The mitral valve is abnormal. Trivial mitral valve regurgitation. No evidence of mitral stenosis.  5. The aortic valve is tricuspid. There is moderate calcification of the aortic valve. There is moderate thickening of the aortic valve. Aortic valve regurgitation is not visualized. Mild aortic valve stenosis.  6. The inferior vena cava is normal in size with greater than 50% respiratory variability, suggesting right atrial pressure of 3 mmHg. FINDINGS  Left Ventricle: Left ventricular ejection fraction, by estimation, is 50 to 55%. The left ventricle has low normal function. The left ventricle has no regional wall motion abnormalities. Strain was performed and the global longitudinal strain is indeterminate. The left ventricular internal cavity size was normal in size. There is no left ventricular hypertrophy. Left ventricular diastolic parameters are indeterminate.  Right Ventricle: The right ventricular size is mildly enlarged. Moderately increased right ventricular wall thickness. Right ventricular systolic function is normal. Left Atrium: Left atrial size was normal in size. Right Atrium: Right atrial size was mildly dilated. Pericardium: Trivial pericardial effusion is present. The pericardial effusion is posterior to the left ventricle. Mitral Valve: The mitral valve is abnormal. There is mild thickening of the mitral valve leaflet(s). There is mild calcification of the mitral valve leaflet(s). Trivial mitral valve regurgitation. No evidence of mitral valve stenosis. Tricuspid Valve: The tricuspid valve is normal in structure. Tricuspid valve regurgitation is mild . No evidence of tricuspid stenosis. Aortic Valve: The aortic valve is tricuspid. There is moderate calcification of the aortic valve. There is moderate thickening of the aortic valve. Aortic valve regurgitation is not visualized. Mild aortic stenosis is present. Aortic valve mean gradient measures 7.0 mmHg. Aortic valve peak gradient measures 10.0 mmHg. Aortic valve area, by VTI measures 1.51 cm. Pulmonic Valve: The pulmonic valve was normal in structure. Pulmonic valve regurgitation is trivial. No evidence of pulmonic stenosis. Aorta: The aortic root is normal in size and structure. Venous: The inferior vena cava is normal in size with greater than 50% respiratory variability, suggesting right atrial pressure of 3 mmHg. IAS/Shunts: No atrial level shunt detected by color flow Doppler. Additional Comments: 3D was performed not requiring image post processing on an independent workstation and was indeterminate.  LEFT VENTRICLE PLAX 2D LVIDd:         5.00 cm LVIDs:         3.60 cm LV PW:         1.00 cm LV IVS:        0.90 cm LVOT diam:     1.80 cm LV SV:         48 LV SV Index:   25 LVOT Area:     2.54 cm  RIGHT VENTRICLE RV S prime:     13.20 cm/s TAPSE (M-mode): 1.2 cm LEFT ATRIUM             Index         RIGHT ATRIUM           Index LA Vol (A2C):   38.7 ml 19.94 ml/m  RA Area:     22.90 cm LA Vol (A4C):   63.5 ml 32.72 ml/m  RA Volume:   66.60 ml  34.32 ml/m LA Biplane Vol:  49.8 ml 25.66 ml/m  AORTIC VALVE AV Area (Vmax):    1.52 cm AV Area (Vmean):   1.26 cm AV Area (VTI):     1.51 cm AV Vmax:           158.00 cm/s AV Vmean:          124.000 cm/s AV VTI:            0.320 m AV Peak Grad:      10.0 mmHg AV Mean Grad:      7.0 mmHg LVOT Vmax:         94.20 cm/s LVOT Vmean:        61.400 cm/s LVOT VTI:          0.190 m LVOT/AV VTI ratio: 0.59  AORTA Ao Root diam: 2.70 cm MITRAL VALVE MV Area (PHT): 3.97 cm     SHUNTS MV Decel Time: 191 msec     Systemic VTI:  0.19 m MV E velocity: 123.00 cm/s  Systemic Diam: 1.80 cm Maude Emmer MD Electronically signed by Maude Emmer MD Signature Date/Time: 07/28/2024/3:06:02 PM    Final       PHYSICAL EXAM General:  Well-nourished, well-developed intubated on sedation CV: Regular rate and rhythm on monitor Respiratory:  Regular, unlabored respirations on full vent support     NEURO: Intubated on sedation, rest with eyes closed and unable to respond to name or follow commands. Does not respond to tactile stimuli. Pupils are 2 mm, midline, with sluggish reaction. No doll's eye.  Corneal reflex sluggish on the right and absent on the left. Positive gag reflex. Facial symmetry not able to test due to ET tube. Tongue protrusion not cooperative. On pain stimulation, only withdraws weakly on right with no response in LUE or LLE. Sensation, coordination, and gait not tested.   Most Recent NIH  Dizziness Present: No (08/06 1200) Headache Present: No (08/06 1200) Interval: Other (Comment) (08/05 0700) Level of Consciousness (1a.)   : Not alert, requires repeated stimulation to attend, or is obtunded and requires strong or painful stimulation to make movements (not stereotyped) (08/06 1200) LOC Questions (1b. )   : Answers neither question correctly (08/06 1200) LOC  Commands (1c. )   : Performs neither task correctly (08/06 1200) Best Gaze (2. )  : Normal (08/06 1200) Visual (3. )  : Bilateral hemianopia (blind including cortical blindness) (08/06 1200) Facial Palsy (4. )    : Normal symmetrical movements (08/06 1200) Motor Arm, Left (5a. )   : No effort against gravity (08/06 1200) Motor Arm, Right (5b. ) : Some effort against gravity (08/06 1200) Motor Leg, Left (6a. )  : No effort against gravity (08/06 1200) Motor Leg, Right (6b. ) : No effort against gravity (08/06 1200) Limb Ataxia (7. ): Absent (08/06 1200) Sensory (8. )  : Mild-to-moderate sensory loss, patient feels pinprick is less sharp or is dull on the affected side, or there is a loss of superficial pain with pinprick, but patient is aware of being touched (08/06 1200) Best Language (9. )  : Mute, global aphasia (08/06 1200) Dysarthria (10. ): Intubated or other physical barrier (08/06 1200) Extinction/Inattention (11.)   : Profound hemi-inattention or extinction to more than one modality (08/06 1200) Complete NIHSS TOTAL: 26 (08/06 1200)      ASSESSMENT/PLAN  Patricia Wu is a 68 y.o. female with history of ESRD, atrial fibrillation on warfarin, hyperlipidemia, hypercholesterolemia, HTN, and medication noncompliance admitted for acute onset  of left sided weakness, left facial droop, drooling, anfd dysarthria shortly before discharge of RLE cellulitis.  NIH on Admission 20. Patient demonstrates further neurological decline.  Compared to yesterday, there is further deterioration in neurological function, with absent posturing, no movement in the left extremities, absent left corneal reflex, decreased overall tone, and a gag reflex present.  Prognosis remains poor.  We will plan for palliative care to meet with family today to discuss goals of care.  We will plan to discontinue sedation to assess for any improvement in responsiveness. Per NSG, no surgery recommended at this time due to  minimal benefit.     ICH:  right brain large ICH with midline shift, etiology likely chronic A Fib on coumadin  with HTN and ESRD Code Stroke CT head large right frontal parenchymal hematoma with surrounding edema, mass effect, and sulcal effacement.  6 mm leftward midline shift.  SAH over the right frontal lobe.  Acute right frontoparietal subdural hematoma 4 mm.  Partial effacement of the right lateral ventricle and third ventricle. CTA head & neck no extravasation visualized within the R parietal hematoma.   CT repeat unchanged appearance of IPH in the right parietal lobe with 5 mm at leftward midline shift.  Dilation of the left lateral total ventricle atrium and temporal foreign are unchanged.  Blood in lateral ventricles is unchanged. MRI 5.6 x 5.6 x 4.5 cm right frontoparietal hematoma with surrounding edema, effacement of the right    lateral ventricle and 1 cm midline shift 2D Echo LVEF is 50-55%, abnormal mitral valve, moderate calcification of the aortic valve LDL 108 HgbA1c pending UDS + for benzo VTE prophylaxis - SCDs warfarin daily prior to admission, now on No antithrombotic due to IPH Therapy recommendations:  Pending Disposition:  pending, will consult palliative care for GOC discussion   Cerebral edema CT showed 5-6 mm MLS MRI 5.6 x 5.6 x 4.5 cm right frontal parietal hematoma with surrounding edema, effacement of the right lateral ventricle and 1 cm of midline shift On 3% saline @ 75 -> 23.4% Q6h PRN given ESRD on HD status Na 140-143-148-153 Na Q6h check NSG Dr. Gillie on board, no surgery at this time   Seizure like activity ? LTM EEG moderate to severe encephalopathy, no seizure will d/c LTM Continue keppra  500 bid Still on propofol   Atrial fibrillation with RVR Home Meds: Coumadin   S/p Kcentra  and VitK INR 2.8->1.6->1.8->1.7 Another vitK 10mg  IV x 1 Off ditalizem gtt  On metoprolol  25 tid No AC given ICH   Respiratory failure Intubated on vent Vent  management per CCM On propofol    Hypertension Home meds:  metoprolol  tartate 100mg , metoprolol  tartate 25 mg On metoprolol  25 tid Stable Off Levophed  Blood Pressure Goal: SBP less than 160    Hyperlipidemia Home meds: atorvastatin  10 mg LDL 108, goal < 70 Consider statin at discharge   Fever Leukocytosis Tmax 100.4--101.5--afebrile WBC 10.6--14.8--18.2 UA WBCs > 50 CXR no pulmonary infection On Rocephin    Dysphagia Patient has post-stroke dysphagia Now NPO Cortrak placed On TF @ 45   Other Stroke Risk Factors Obesity, Body mass index is 33.68 kg/m., BMI >/= 30 associated with increased stroke risk, recommend weight loss, diet and exercise as appropriate  Coronary artery disease   Other Active Problems ESRD on HD - nephrology on board CTA showed 4 mm posteriorly located aneurysm arising from the communicating segment of the R ICA - monitoring RLE open wound - wound care consult    Hospital day #  2  Alan Maiden, MD PGY-1  ATTENDING NOTE: I reviewed above note and agree with the assessment and plan. Pt was seen and examined.   No family at bedside.  Patient still intubated on vent, on sedation.  However, on exam, patient less responsive than yesterday.  Eyes closed, with forced eye opening, eyes midline, pupil equal sluggish to light, very weak corneal reflexes, positive gag.  On pen sensation minimum movement of all extremities.  No posturing seen today.  MRI this morning showed stable hematoma but with worsening cerebral edema and 1 cm of midline shift.  Neurosurgery on board, no indication for surgical intervention.  Sodium 153, continue 23.4% sodium as needed.  INR 1.7, off Cardizem  drip, on p.o. metoprolol .  On tube feeding, afebrile, continue Rocephin .  Will involve palliative care for GOC discussion.  For detailed assessment and plan, please refer to above as I have made changes wherever appropriate.   Ary Cummins, MD PhD Stroke Neurology 07/29/2024 6:52  PM  This patient is critically ill due to large ICH, cerebral edema, respiratory failure, hypertensive emergency, ESRD on dialysis, A-fib RVR, fever and at significant risk of neurological worsening, death form brain herniation, brain death, heart failure, sepsis. This patient's care requires constant monitoring of vital signs, hemodynamics, respiratory and cardiac monitoring, review of multiple databases, neurological assessment, discussion with family, other specialists and medical decision making of high complexity. I spent 35 minutes of neurocritical care time in the care of this patient.   To contact Stroke Continuity provider, please refer to [email or web address]. After hours, contact General Neurology

## 2024-07-29 NOTE — Progress Notes (Signed)
 Tech went to MRI to remove CT leads so pt could have an MRI.

## 2024-07-29 NOTE — Progress Notes (Addendum)
 [facility] Progress Note   Subjective:   Patient seen in the ICU this morning. Her 3% saline gtt was stopped this morning. They are continuing with frequent Na checks. Patient is intubated and on minimal vent settings. She is sedated with Diprivan  gtt. Her husband and daughter were bedside. They were rightfully very tearful and hoping for a miracle. We allowed family to express their feelings. BP stable not on pressors. HD scheduled for today.  Objective Vitals:   07/29/24 1200 07/29/24 1230 07/29/24 1300 07/29/24 1330  BP: 106/63 (!) 114/51 (!) 121/57 (!) 104/53  Pulse: 89 88 90 83  Resp: (!) 37 (!) 32 (!) 27 (!) 25  Temp: 99 F (37.2 C) 99.1 F (37.3 C) 99.3 F (37.4 C) 99.3 F (37.4 C)  TempSrc:      SpO2: 99% 100% 98% 100%  Weight:      Height:       Exam Gen on vent, sedated No rash, cyanosis or gangrene Sclera anicteric, throat w/ ETT No jvd or bruits Chest clear anterior/ lateral RRR no MRG Abd soft ntnd no mass or ascites +bs GU normal MS no joint effusions or deformity Ext no LE edema, no wounds or ulcers Neuro is on vent, sedated    LUA AVF+bruit   Additional Objective Labs: Basic Metabolic Panel: Recent Labs  Lab 07/27/24 1455 07/27/24 2203 07/28/24 0123 07/28/24 0408 07/28/24 0955 07/28/24 2149 07/29/24 0257 07/29/24 0926 07/29/24 1052  NA 142 140   < > 143   < > 153* 152* 153*  --   K 3.0* 4.0  --  3.8  --   --  4.1  --   --   CL 95*  --   --  101  --   --  114*  --   --   CO2 27  --   --  22  --   --  17*  --   --   GLUCOSE 93  --   --  135*  --   --  145*  --   --   BUN 17  --   --  21  --   --  33*  --   --   CREATININE 3.98*  --   --  4.99*  --   --  6.63*  --   --   CALCIUM  9.2  --   --  8.2*  --   --  8.8*  --   --   PHOS  --   --   --  6.1*  --   --   --   --  6.8*   < > = values in this interval not displayed.   Liver Function Tests: Recent Labs  Lab 07/22/24 1524 07/27/24 1455  AST 24 21  ALT 19 22  ALKPHOS  557* 514*  BILITOT 0.8 0.9  PROT 8.3 7.8  ALBUMIN 4.1 3.3*   No results for input(s): LIPASE, AMYLASE in the last 168 hours. CBC: Recent Labs  Lab 07/27/24 1455 07/27/24 2203 07/28/24 0408 07/29/24 0257  WBC 10.6*  --  14.8* 18.2*  NEUTROABS 7.2  --   --   --   HGB 10.7* 11.2* 9.3* 9.2*  HCT 33.4* 33.0* 28.7* 29.7*  MCV 97.9  --  97.0 98.7  PLT 226  --  212 218   CBG: Recent Labs  Lab 07/27/24 1723 07/28/24 0327  GLUCAP 93 107*    Medications:  cefTRIAXone  (ROCEPHIN )  IV  Stopped (07/28/24 2153)   diltiazem  (CARDIZEM ) 125 mg in sodium chloride  0.9% 125 mL (1mg /mL) infusion Stopped (07/29/24 1017)   feeding supplement (OSMOLITE 1.5 CAL) 15 mL/hr at 07/29/24 1305   propofol  (DIPRIVAN ) infusion 10 mcg/kg/min (07/29/24 1305)   sodium chloride  23.4% injection      atorvastatin   10 mg Per Tube Daily   Chlorhexidine  Gluconate Cloth  6 each Topical Daily   docusate  100 mg Per Tube BID   feeding supplement (PROSource TF20)  60 mL Per Tube Daily   leptospermum manuka honey  1 Application Topical Daily   levETIRAcetam   500 mg Intravenous Q12H   metoprolol  tartrate  25 mg Per Tube TID   mouth rinse  15 mL Mouth Rinse Q2H   pantoprazole  (PROTONIX ) IV  40 mg Intravenous QHS   polyethylene glycol  17 g Per Tube Daily   Home bp meds: Irbesartan  150 every day Lopressor  100 bid Lopressor  25 bid Lasix  40mg  every day prn edema    OP HD: MWF [facility]  3.5h   84.5kg  B400   AVF   Heparin  none Mircera 30 micrograms on 8/02 Sensipar 30mg  three times per week Sod thiosulfate recently started for leg wounds   CXR 8/05 - vasc congestion, no edema K+ 3.8, bun 21, creat 4.99   Assessment/ Plan: IC bleed: w/ midline shift on imaging. Was getting 3% saline at 75 cc/hr. This has been stopped since this morning. Continuing with frequent Na checks.  Seizures/ status epilepticus: per neuro/ CCM ESRD: on HD MWF. Next HD today w/ highest Na+ setting (= 145) while on 3% saline.   HTN: bp's are low normal, defer BP control to ICU / neuro Volume: euvolemic on exam, 4 kg over dry wt, goal UF 2 L w/ HD. CXR w/ vasc congestion.  Anemia of esrd: Hb 9.2 here, follow, transfuse prn.  Afib - on IV diltiazem  for rate control   Belvie Och, NP 07/29/2024, 2:09 PM  [facility]

## 2024-07-29 NOTE — Progress Notes (Signed)
 Nutrition Follow Up  DOCUMENTATION CODES:   Not applicable  INTERVENTION:   Initiate tube feeding via OG: Start Osmolite 1.5 at 42ml/hr and advance by 10ml q6h to goal rate of 68ml/hr ( per day) 60ml ProSource TF20 once daily Provides 1700 kcal, 87g protein, free water  daily  Plan for Cortrak placement next day team is available since pt already has access for enteral  NUTRITION DIAGNOSIS:   Inadequate oral intake related to acute illness as evidenced by NPO status. Remains applicable  GOAL:   Patient will meet greater than or equal to 90% of their needs Progressing  MONITOR:   Vent status, Labs, Weight trends, TF tolerance, I & O's, Skin  REASON FOR ASSESSMENT:   Consult, Ventilator Assessment of nutrition requirement/status  ASSESSMENT:   Pt transferred from [facility] with abrupt LOC and left facial droop, LUE weakness and code stroke found to have right parietal IPH. Initially admitted 8/4 for RLE wound associated pain and erythema. PMH significant for HTN, HLD, afib, ESRD on HD.   8/4: CT Head was obtained demonstrating large IPH in the R parietal lobe (6.3 x 5.3 x 4.4) with progressive edema and worsening mass effect of R lateral ventricle with early L ventricular entrapment and leftward midline shift of 5mm  Spoke with pt's husband and daughter at bedside. Discussed initiating tube feeds via OG. Will plan to place Cortrak as soon as possible, but will likely not be placed today 8/5.   Pt discussed during ICU rounds with RN and MD. Plan to have HD treatment at bedside today. Will begin tube feeds and monitor any changes in status. Prognosis appears poor, palliative consulted today.  Patient is currently intubated on ventilator support MV: 13.8 L/min Temp (24hrs), Avg:100.1 F (37.8 C), Min:99.1 F (37.3 C), Max:101.5 F (38.6 C) SBP goal <160  EDW 84.5 kg Current weight: 89 kg  Medications: colace BID, miralax  daily, protonix  daily, ceftriaxone     Drips: Propofol  @ 15.23 ml/hr (provides additional 402 kcal per day)  Labs:  Sodium 152/Chloride 114 Cr 6.63 Anion gap 20 Phosphorus 6.1 (8/5) GFR 6 CBG's 107, 93 x24 hours  Diet Order:   Diet Order             Diet NPO time specified  Diet effective now                   EDUCATION NEEDS:   No education needs have been identified at this time  Skin:  Skin Assessment: Skin Integrity Issues: Skin Integrity Issues:: Other (Comment) Other: necrotic wound to left medial leg  Last BM:  unknown/PTA  Height:   Ht Readings from Last 1 Encounters:  07/27/24 5' 4 (1.626 m)    Weight:   Wt Readings from Last 1 Encounters:  07/28/24 89 kg    Ideal Body Weight:  54.5 kg  BMI:  Body mass index is 33.68 kg/m.  Estimated Nutritional Needs:   Kcal:  1600-1800  Protein:  80-95g  Fluid:  1 L + UOP   Josette Glance, MS, RDN, LDN Clinical Dietitian I Please reach out via secure chat

## 2024-07-29 NOTE — Progress Notes (Signed)
 NAME:  Patricia Wu, MRN:  [id number], DOB:  May 01, 1956, LOS: 2 ADMISSION DATE:  07/27/2024 CONSULTATION DATE:  07/27/2024 REFERRING MD:  Michaela - Neuro, CHIEF COMPLAINT:  R parietal IPH   History of Present Illness:  68 year old woman who presented to [facility] 8/4 as a transfer from [facility] for management of R parietal IPH. PMHx significant for HTN, HLD, Afib (on Coumadin ), ESRD on HD (MWF via LUE AVF).   Initially presented to [facility] ED 8/4 for RLE wound with associated pain and erythema.  Staff at dialysis center reportedly noted worsening erythema of the wound from prior. Concern for cellulitis. On ED arrival, patient was afebrile with HR 99, BP 149/87, RR 16, SpO2 98% on RA. Labs were notable for WBC 10.6, Hgb 10.7, Plt 226. INR 2.8 (on Coumadin ). Na 142, K 3.0, CO2 27, BUN/Cr 17/3.98. Transaminases WNL, Alk Phos 514, Tbili 0.9. LA 1.9. XR R Tib/Fib demonstrated soft tissue swelling, no concern for osteomyelitis. Keflex  initiated.  Patient was being prepared for discharge when ~1710 when daughter called for RN; patient reportedly had abrupt change in LOC with L facial droop, LUE weakness, drooling/dysarthria. Code Stroke called with CT Head demonstrating large R frontal parenchymal hematoma with surrounding edema/mass effect and 6mm leftward midline shift as well as acute R frontoparietal SDH (4mm thickness) with associated SAH over R frontal lobe. Neuro and NSGY consulted. Kcentra  and Vitamin K  administered. Esmolol  gtt started for rate/BP control. Intubated for airway protection. Patient deemed appropriate for transfer to [facility].  On arrival to [facility], repeat CT Head was obtained demonstrating large IPH in the R parietal lobe (6.3 x 5.3 x 4.4) with progressive edema and worsening mass effect of R lateral ventricle with early L ventricular entrapment and leftward midline shift of 5mm. HTS 3% started per Neurology.  PCCM consulted for management of ventilator and hemodynamics.  Pertinent Medical History:    Past Medical History:  Diagnosis Date   A-fib (HCC)    AC with Coumadin    ESRD on dialysis (HCC)    MWF via LUE AVF   Hypercholesteremia    Hypertension    Noncompliance with medication regimen    Significant Hospital Events: Including procedures, antibiotic start and stop dates in addition to other pertinent events   8/4 - Presented to [facility] ED with RLE wound. C/f cellulitis. Keflex  started. Preparing for discharge when patient had abrupt change in consciousness with L-sided facial droop, LUE weakness, dysarthria. Code Stroke called. Large R parietal IPH on CT. Intubated. Transferred to [facility] for further management. Repeat CT with worsening edema/hemorrhage. PCCM consulted for vent/BP management. 8/5 - febrile overnight to > 101. MRSA PCR neg. On vent. Fio2 40%. EEG ovenright c/w status. CT head unchanged  8/6 11 beat run of V. tach overnight, self-limiting  Interim History / Subjective:  Sedated on ventilator  Objective:  Blood pressure (!) 102/55, pulse 70, temperature 99.1 F (37.3 C), resp. rate (!) 26, height 5' 4 (1.626 m), weight 89 kg, SpO2 100%.    Vent Mode: PRVC FiO2 (%):  [40 %] 40 % Set Rate:  [18 bmp] 18 bmp Vt Set:  [430 mL] 430 mL PEEP:  [5 cmH20] 5 cmH20 Plateau Pressure:  [14 cmH20-27 cmH20] 27 cmH20   Intake/Output Summary (Last 24 hours) at 07/29/2024 0659 Last data filed at 07/29/2024 0600 Gross per 24 hour  Intake 1604.58 ml  Output 204 ml  Net 1400.58 ml   Filed Weights   07/27/24 1344 07/28/24 0500  Weight: 84.6 kg  89 kg   Physical Examination: General: Acute on chronic ill-appearing middle-aged female lying in bed on mechanical ventilation in no acute distress HEENT: ETT, MM pink/moist, PERRL,  Neuro: Minimally responsive on vent, cough and gag present seen spontaneously flickering left toes CV: s1s2 regular rate and rhythm, no murmur, rubs, or gallops,  PULM: Clear to auscultation bilaterally, no increased work of breathing, no added breath  sounds, tolerating ventilator GI: soft, bowel sounds active in all 4 quadrants, non-tender, non-distended, tolerating TF Extremities: warm/dry, no edema  Skin: no rashes or lesions  Resolved Hospital Problem List:    Assessment & Plan:  Acute R parietal intraparenchymal hemorrhage with brain compression Status epileticus 8/4 - 07/28/24 -Initial CT Head demonstrating large R frontal parenchymal hematoma with surrounding edema/mass effect + 6mm leftward midline shift, acute R frontoparietal SDH (4mm thickness) with associated SAH over R frontal lobe.  -Repeat CT with large IPH in the R parietal lobe (6.3 x 5.3 x 4.4) with progressive edema and worsening mass effect of R lateral ventricle with early L ventricular entrapment and leftward midline shift of 5mm.  P: Primary management per neurology  Maintain neuro protective measures; goal for eurothermia, euglycemia, eunatermia, normoxia, and PCO2 goal of 35-40 Nutrition and bowel regiment  Seizure precautions  AEDs per neurology  Aspirations precautions  SBP per neuro  Follow up MRI brain  EEG per neuro  23% saline per neuro   Acute respiratory insufficiency in the setting of acute IPH  -Requiring MV/airway protection 8/6 Concern of possible aspiration PNA  P: Continue ventilator support with lung protective strategies  Wean PEEP and FiO2 for sats greater than 90%. Head of bed elevated 30 degrees. Plateau pressures less than 30 cm H20.  Follow intermittent chest x-ray and ABG.   SAT/SBT as tolerated, mentation preclude extubation  Ensure adequate pulmonary hygiene  Follow cultures  VAP bundle in place  PAD protocol Empiric Ceftriaxone    Atrial fibrillation on Coumadin  with RVR - AC contraindicated in the setting of IPH HTN HLD P: Continuous telemetry Optimize electrolytes Transition Cardizem  drip to home p.o. beta-blocker at reduced dose Anticoagulation remains on hold Resume home medications when appropriate  ESRD on  HD P: Primary management per nephrology Volume removal per HD Avoid nephrotoxic agents Tentative plan for IHD today  Possible urinary tract infection -UA with rare bacteria but large leukocytes in setting of end-stage renal disease P: Empiric ceftriaxone  as above  Cellulitis RLE prior to admit P: Ceftriaxone  as above  Anemia  P: Trend CBC  Transfuse per protocol  Hgb goal > 7  Best Practice: (right click and Reselect all SmartList Selections daily)   Diet/type: NPO DVT prophylaxis: SCD, otherwise contraindicated GI prophylaxis: PPI Lines: Central line, may require arterial line Foley:  Yes, and it is still needed Code Status:  full code Last date of multidisciplinary goals of care discussion [Per Primary Team]   Family 8/5 - husband and daughte rpdated at bedside  CRITICAL CARE Performed by: Clair Bardwell D. Harris   Total critical care time: 38 minutes  Critical care time was exclusive of separately billable procedures and treating other patients.  Critical care was necessary to treat or prevent imminent or life-threatening deterioration.  Critical care was time spent personally by me on the following activities: development of treatment plan with patient and/or surrogate as well as nursing, discussions with consultants, evaluation of patient's response to treatment, examination of patient, obtaining history from patient or surrogate, ordering and performing treatments and interventions, ordering and  review of laboratory studies, ordering and review of radiographic studies, pulse oximetry and re-evaluation of patient's condition.  Kashten Gowin D. Harris, NP-C [facility] Personal contact information can be found on Amion  If no contact or response made please call [phone] 07/29/2024, 7:04 AM

## 2024-07-29 NOTE — Progress Notes (Signed)
 PT Cancellation Note  Patient Details Name: Patricia Wu MRN: [id number] DOB: 01-05-1956   Cancelled Treatment:    Reason Eval/Treat Not Completed: Medical issues which prohibited therapy; noted continued progression of edema and now 1cm L shift.  Will sign off and anticipate new PT order when pt stable for mobility.    Montie Portal 07/29/2024, 9:59 AM Micheline Portal, PT Acute Rehabilitation Services Office:[phone] 07/29/2024

## 2024-07-29 NOTE — Progress Notes (Signed)
   07/29/24 2118  Vitals  Temp 99.7 F (37.6 C)  BP 106/62  MAP (mmHg) 77  BP Location Right Arm  BP Method Automatic  Patient Position (if appropriate) Lying  Pulse Rate (!) 118  Pulse Rate Source Monitor  Resp (!) 24  Oxygen Therapy  SpO2 100 %  O2 Device Ventilator  Patient Activity (if Appropriate) In bed  Pulse Oximetry Type Continuous  During Treatment Monitoring  Blood Flow Rate (mL/min) 300 mL/min  Arterial Pressure (mmHg) -105.65 mmHg  Venous Pressure (mmHg) 185.24 mmHg  TMP (mmHg) 23.23 mmHg  Ultrafiltration Rate (mL/min) 749 mL/min  Dialysate Flow Rate (mL/min) 300 ml/min  Dialysate Potassium Concentration 3  Dialysate Calcium  Concentration 2.5  Duration of HD Treatment -hour(s) 3.25 hour(s)  Cumulative Fluid Removed (mL) per Treatment  1498.76  HD Safety Checks Performed Yes  Intra-Hemodialysis Comments Tx completed  Post Treatment  Dialyzer Clearance Lightly streaked  Liters Processed 61.8  Fluid Removed (mL) 1500 mL  Tolerated HD Treatment Yes  AVG/AVF Arterial Site Held (minutes) 8 minutes  AVG/AVF Venous Site Held (minutes) 8 minutes  Fistula / Graft Left Upper arm Arteriovenous fistula  Placement Date/Time: 06/18/23 1039   Placed prior to admission: No  Orientation: Left  Access Location: Upper arm  Access Type: Arteriovenous fistula  Site Condition No complications  Fistula / Graft Assessment Present;Thrill;Bruit  Status Deaccessed  Drainage Description None   Unable to reach UF goal due to elevated heart rate and low B/P. Net fluid removed 

## 2024-07-29 NOTE — Progress Notes (Signed)
 LTM VIDEO EEG discontinued - no skin breakdown at [facility]

## 2024-07-29 NOTE — Progress Notes (Signed)
 RT NOTE: RT transported patient on ventilator from room 4N21 to MRI and back to room 4N21 with no complications.

## 2024-07-29 NOTE — Consult Note (Signed)
 WOC Nurse Consult Note: patient with R lower leg wound for months; followed at [facility] PT for wound care; first visit there 04/2024 with reports wound there for 2 months prior; last seen by PT 07/21/2024 with use of Medihoney  Reason for Consult: R lower leg wound  Wound type: full thickness ? Calciphylaxis per PT notes  Pressure Injury POA: NA  Measurement: see nursing flowsheet  Wound bed:60% pink dry 40% yellow  Drainage (amount, consistency, odor) see nursing flowsheet  Periwound: dark discoloration circumferential  Dressing procedure/placement/frequency: Cleanse R lower leg wound with NS, apply Medihoney to wound bed daily, cover with dry gauze and secure with Kerlix roll gauze anchored around foot or silicone foam whichever is preferred/works best.   POC discussed with bedside nurse. WOC team will not follow. Patient should continue with follow-up by outpatient PT for continued wound care after discharge.   Thank you,    Powell Bar MSN, RN-BC, [employer]

## 2024-07-29 NOTE — Progress Notes (Signed)
 Patient ID: Patricia Wu, female   DOB: 1956-09-18, 68 y.o.   MRN: [id number] BP (!) 100/46   Pulse 90   Temp 99.1 F (37.3 C)   Resp (!) 27   Ht 5' 4 (1.626 m)   Wt 89 kg   SpO2 99%   BMI 33.68 kg/m  MRI reviewed, along with followup CT. No real change. Localized shift, but basal cisterns patent. No impending herniation. Still do not believe intervention at this time would provide real benefit to [name]. I have spoken with the family and explained my rationale.  No underlying vascular lesion identified nor did I appreciate a malignancy.  Sedated, on the ventilator.

## 2024-07-29 NOTE — Progress Notes (Signed)
 eLink Physician-Brief Progress Note Patient Name: Patricia Wu DOB: 04-22-1956 MRN: [id number]   Date of Service  07/29/2024  HPI/Events of Note  Patient is more tachycardic with heart rate in the 140s-150s since shift change Lopressor  was held during dayshift for unclear reasons.  eICU Interventions  Continue scheduled Lopressor  later today Add as needed metoprolol  pushes   0218 - Now rate controlled aftert metop , but BP on the lower end. 79/31 (51). No intervention for now. Check lactic with AM labs.  Intervention Category Intermediate Interventions: Arrhythmia - evaluation and management  Christop Hippert 07/29/2024, 7:58 PM

## 2024-07-29 NOTE — Progress Notes (Signed)
 OT Cancellation Note  Patient Details Name: Patricia Wu MRN: [id number] DOB: 10/15/1956   Cancelled Treatment:    Reason Eval/Treat Not Completed: Medical issues which prohibited therapy (noted continued progression of edema and now 1cm L shift. Will s/o and await new OT order when appropriate for therapy)  Devaun Hernandez K, OTD, OTR/L SecureChat [facility] Acute Re[facility] [phone]   [name] 07/29/2024, 10:24 AM

## 2024-07-29 NOTE — Procedures (Signed)
 Continuous Video-EEG Monitoring Report  CPT/Type of Study: 95720 (EEG with video, 12-26 hours)       Indications for Procedure: Altered mental status  Primary neurological diagnosis: G93.40; I61.9  Duration of study: 07/28/2024 12:45 to 07/29/2024 07:53  History: This is a 68 year old female presenting with acute altered mental status and left-sided weakness in the context of large intraparenchymal hematoma centered in the right parietal lobe. Continuous video-EEG monitoring was performed to evaluate for seizure.   Technical Description: The EEG was performed using standard setting per the guidelines of American Clinical Neurophysiology Society (ACNS). A minimum of 21 electrodes were placed on scalp according to the International 10-20 or/and 10-10 Systems. Supplemental electrodes were placed as needed. Single EKG electrode was also used to detect cardiac arrhythmia. Patient's behavior was continuously recorded on video simultaneously with EEG. A minimum of 16 channels were used for data display. Each epoch of study was reviewed manually daily and as needed using standard referential and bipolar montages. Computerized quantitative EEG analysis (such as compressed spectral array analysis, trending, automated spike & seizure detection) were used as indicated.   EEG Description:  Epoch: 07/28/2024 12:45 to 07/29/2024 07:53  Background: There was generalized polymorphic delta and theta slowing. The polymorphic delta slowing was most prominent and persistent in the right frontotemporal region. No discernible posterior dominant rhythm or sleep architecture was recorded. Diffuse beta and spindle-like activity was present, most likely due to medication effect (propofol ).   PERIODIC OR RHYTHMIC PATTERNS: None.  EPILEPTIFORM DISCHARGES: None.  SEIZURES: None.  EVENTS: None.  EKG: Atrial fibrillation was present.   NOTE: Video-EEG monitoring was interrupted from 07:12 to 07:30 on 07/29/2024.    Impression:  This is an abnormal EEG due to the presence of the following: 1) Generalized polymorphic delta and theta slowing, suggesting moderate to severe encephalopathy, which is nonspecific as to etiology (medication effect of propofol  could not be ruled out); 2) Persistent focal polymorphic delta slowing in the right frontotemporal region, suggesting focal cerebral dysfunction or/and structural lesion. No epileptiform discharge or seizure is present. Compared with the last epoch, this epoch shows no significant change.

## 2024-07-30 ENCOUNTER — Ambulatory Visit (HOSPITAL_COMMUNITY): Admitting: Physical Therapy

## 2024-07-30 ENCOUNTER — Inpatient Hospital Stay (HOSPITAL_COMMUNITY)

## 2024-07-30 DIAGNOSIS — I4891 Unspecified atrial fibrillation: Secondary | ICD-10-CM | POA: Diagnosis not present

## 2024-07-30 DIAGNOSIS — R29738 NIHSS score 38: Secondary | ICD-10-CM | POA: Diagnosis not present

## 2024-07-30 DIAGNOSIS — Z515 Encounter for palliative care: Secondary | ICD-10-CM

## 2024-07-30 DIAGNOSIS — J9600 Acute respiratory failure, unspecified whether with hypoxia or hypercapnia: Secondary | ICD-10-CM | POA: Diagnosis not present

## 2024-07-30 DIAGNOSIS — I619 Nontraumatic intracerebral hemorrhage, unspecified: Secondary | ICD-10-CM | POA: Diagnosis not present

## 2024-07-30 DIAGNOSIS — G935 Compression of brain: Secondary | ICD-10-CM | POA: Diagnosis not present

## 2024-07-30 DIAGNOSIS — R4182 Altered mental status, unspecified: Secondary | ICD-10-CM | POA: Diagnosis not present

## 2024-07-30 DIAGNOSIS — Z7189 Other specified counseling: Secondary | ICD-10-CM

## 2024-07-30 DIAGNOSIS — I609 Nontraumatic subarachnoid hemorrhage, unspecified: Secondary | ICD-10-CM | POA: Diagnosis not present

## 2024-07-30 DIAGNOSIS — I611 Nontraumatic intracerebral hemorrhage in hemisphere, cortical: Secondary | ICD-10-CM | POA: Diagnosis not present

## 2024-07-30 DIAGNOSIS — I482 Chronic atrial fibrillation, unspecified: Secondary | ICD-10-CM | POA: Diagnosis not present

## 2024-07-30 LAB — CBC
HCT: 27.6 % — ABNORMAL LOW (ref 36.0–46.0)
Hemoglobin: 8.7 g/dL — ABNORMAL LOW (ref 12.0–15.0)
MCH: 31.1 pg (ref 26.0–34.0)
MCHC: 31.5 g/dL (ref 30.0–36.0)
MCV: 98.6 fL (ref 80.0–100.0)
Platelets: 228 K/uL (ref 150–400)
RBC: 2.8 MIL/uL — ABNORMAL LOW (ref 3.87–5.11)
RDW: 15.4 % (ref 11.5–15.5)
WBC: 14.7 K/uL — ABNORMAL HIGH (ref 4.0–10.5)
nRBC: 0.3 % — ABNORMAL HIGH (ref 0.0–0.2)

## 2024-07-30 LAB — BASIC METABOLIC PANEL WITH GFR
Anion gap: 13 (ref 5–15)
BUN: 22 mg/dL (ref 8–23)
CO2: 26 mmol/L (ref 22–32)
Calcium: 8.7 mg/dL — ABNORMAL LOW (ref 8.9–10.3)
Chloride: 108 mmol/L (ref 98–111)
Creatinine, Ser: 4.59 mg/dL — ABNORMAL HIGH (ref 0.44–1.00)
GFR, Estimated: 10 mL/min — ABNORMAL LOW (ref >60)
Glucose, Bld: 191 mg/dL — ABNORMAL HIGH (ref 70–99)
Potassium: 4.4 mmol/L (ref 3.5–5.1)
Sodium: 147 mmol/L — ABNORMAL HIGH (ref 135–145)

## 2024-07-30 LAB — GLUCOSE, CAPILLARY
Glucose-Capillary: 202 mg/dL — ABNORMAL HIGH (ref 70–99)
Glucose-Capillary: 151 mg/dL — ABNORMAL HIGH (ref 70–99)
Glucose-Capillary: 198 mg/dL — ABNORMAL HIGH (ref 70–99)
Glucose-Capillary: 175 mg/dL — ABNORMAL HIGH (ref 70–99)

## 2024-07-30 LAB — PHOSPHORUS: Phosphorus: 5.6 mg/dL — ABNORMAL HIGH (ref 2.5–4.6)

## 2024-07-30 LAB — HEMOGLOBIN A1C
Hgb A1c MFr Bld: 5.6 % (ref 4.8–5.6)
Mean Plasma Glucose: 114 mg/dL

## 2024-07-30 LAB — SODIUM: Sodium: 149 mmol/L — ABNORMAL HIGH (ref 135–145)

## 2024-07-30 LAB — MAGNESIUM: Magnesium: 2.3 mg/dL (ref 1.7–2.4)

## 2024-07-30 LAB — LACTIC ACID, PLASMA: Lactic Acid, Venous: 1.8 mmol/L (ref 0.5–1.9)

## 2024-07-30 MED ORDER — ONDANSETRON HCL 4 MG/2ML IJ SOLN: 4.0000 mg | Freq: Four times a day (QID) | INTRAMUSCULAR | Status: DC | PRN

## 2024-07-30 MED ORDER — GLYCOPYRROLATE 0.2 MG/ML IJ SOLN: 0.2000 mg | INTRAMUSCULAR | Status: DC | PRN

## 2024-07-30 MED ORDER — CHLORHEXIDINE GLUCONATE CLOTH 2 % EX PADS: 6.0000 | MEDICATED_PAD | Freq: Every day | CUTANEOUS | Status: DC

## 2024-07-30 MED ORDER — ACETAMINOPHEN 325 MG PO TABS: 650.0000 mg | ORAL_TABLET | Freq: Four times a day (QID) | ORAL | Status: DC | PRN

## 2024-07-30 MED ORDER — SODIUM CHLORIDE 0.9 % IV SOLN: INTRAVENOUS | Status: DC

## 2024-07-30 MED ORDER — POLYVINYL ALCOHOL 1.4 % OP SOLN: 1.0000 [drp] | Freq: Four times a day (QID) | OPHTHALMIC | Status: DC | PRN

## 2024-07-30 MED ORDER — FENTANYL CITRATE PF 50 MCG/ML IJ SOSY
25.0000 ug | PREFILLED_SYRINGE | INTRAMUSCULAR | Status: DC | PRN
  Administered 2024-07-30: 100 ug via INTRAVENOUS
  Filled 2024-07-30: qty 2

## 2024-07-30 MED ORDER — AMIODARONE HCL IN DEXTROSE 360-4.14 MG/200ML-% IV SOLN
30.0000 mg/h | INTRAVENOUS | Status: DC
  Administered 2024-07-30: 30 mg/h via INTRAVENOUS
  Filled 2024-07-30 (×3): qty 200

## 2024-07-30 MED ORDER — GLYCOPYRROLATE 1 MG PO TABS: 1.0000 mg | ORAL_TABLET | ORAL | Status: DC | PRN

## 2024-07-30 MED ORDER — AMIODARONE LOAD VIA INFUSION
150.0000 mg | Freq: Once | INTRAVENOUS | Status: AC
  Administered 2024-07-30: 150 mg via INTRAVENOUS
  Filled 2024-07-30: qty 83.34

## 2024-07-30 MED ORDER — NOREPINEPHRINE 4 MG/250ML-% IV SOLN
0.0000 ug/min | INTRAVENOUS | Status: DC
  Administered 2024-07-30: 18 ug/min via INTRAVENOUS
  Administered 2024-07-30: 2 ug/min via INTRAVENOUS
  Administered 2024-07-30: 14 ug/min via INTRAVENOUS
  Filled 2024-07-30 (×3): qty 250

## 2024-07-30 MED ORDER — ACETAMINOPHEN 650 MG RE SUPP: 650.0000 mg | Freq: Four times a day (QID) | RECTAL | Status: DC | PRN

## 2024-07-30 MED ORDER — AMIODARONE HCL IN DEXTROSE 360-4.14 MG/200ML-% IV SOLN
60.0000 mg/h | INTRAVENOUS | Status: AC
  Administered 2024-07-30 (×2): 60 mg/h via INTRAVENOUS

## 2024-07-30 MED ORDER — SENNOSIDES-DOCUSATE SODIUM 8.6-50 MG PO TABS
1.0000 | ORAL_TABLET | Freq: Two times a day (BID) | ORAL | Status: DC
  Administered 2024-07-30: 1
  Filled 2024-07-30: qty 1

## 2024-07-30 MED ORDER — MIDAZOLAM HCL 2 MG/2ML IJ SOLN
2.0000 mg | INTRAMUSCULAR | Status: DC | PRN
  Administered 2024-07-30: 4 mg via INTRAVENOUS
  Filled 2024-07-30: qty 4

## 2024-07-30 MED ORDER — ALBUTEROL SULFATE (2.5 MG/3ML) 0.083% IN NEBU: 2.5000 mg | INHALATION_SOLUTION | RESPIRATORY_TRACT | Status: DC | PRN

## 2024-07-30 MED ORDER — ONDANSETRON 4 MG PO TBDP: 4.0000 mg | ORAL_TABLET | Freq: Four times a day (QID) | ORAL | Status: DC | PRN

## 2024-07-30 MED ORDER — HALOPERIDOL LACTATE 5 MG/ML IJ SOLN: 2.5000 mg | INTRAMUSCULAR | Status: DC | PRN

## 2024-07-31 ENCOUNTER — Telehealth: Payer: Self-pay | Admitting: Family Medicine

## 2024-07-31 MED FILL — Midazolam HCl Inj PF 5 MG/5ML (Base Equivalent): INTRAMUSCULAR | Qty: 5 | Status: AC

## 2024-07-31 MED FILL — Fentanyl Citrate Preservative Free (PF) Inj 100 MCG/2ML: INTRAMUSCULAR | Qty: 2 | Status: AC

## 2024-07-31 NOTE — Telephone Encounter (Signed)
 Notification received that Patricia Wu has passed.  I am very sad to see this news.  I reached out to her spouse Patricia Wu to express condolences on her passing.  Let him know that we were very sad to see this and here to support him during this difficult time.  Has family in town, denies any specific needs at this time.  Advised to let me know if I can help with any needs in the interim. He appreciated my call.

## 2024-08-03 ENCOUNTER — Ambulatory Visit (HOSPITAL_COMMUNITY): Admitting: Physical Therapy

## 2024-08-04 ENCOUNTER — Encounter

## 2024-08-06 ENCOUNTER — Ambulatory Visit (HOSPITAL_COMMUNITY): Admitting: Physical Therapy

## 2024-08-24 NOTE — Progress Notes (Addendum)
 [facility] Progress Note   Subjective:   Patient had HD overnight with 1.5L removed. BP remained stable during HD. During morning rounds, RN noted the absence of reflexes on patient, which was a change. Patient for stat imaging of her head. Patient had episode of tachycardia overnight and was given metoprolol . Family at bedside today.   Objective Vitals:   08/05/2024 1048 08/13/2024 1100 08/06/2024 1104 08/17/2024 1106  BP: (!) 117/90 (!) 143/103 (!) 158/127 (!) 173/162  Pulse: (!) 123 (!) 120 (!) 130 (!) 124  Resp: 18 18 18 18   Temp: (!) 97.3 F (36.3 C) (!) 97.5 F (36.4 C) 97.7 F (36.5 C) 97.7 F (36.5 C)  TempSrc:      SpO2: 100% 100% 100% 100%  Weight:      Height:       Exam Gen on vent, sedated, family at bedside No rash, cyanosis or gangrene Sclera anicteric, throat w/ ETT No jvd or bruits Chest clear anterior/ lateral RRR no MRG Abd soft ntnd no mass or ascites +bs GU normal MS no joint effusions or deformity Ext no LE edema, no wounds or ulcers Neuro is on vent, sedated, no reflexes    LUA AVF+bruit   Additional Objective Labs: Basic Metabolic Panel: Recent Labs  Lab 07/28/24 0408 07/28/24 0955 07/29/24 0257 07/29/24 0926 07/29/24 1052 07/29/24 1502 07/29/24 2222 07/25/2024 0440 07/25/2024 1003  NA 143   < > 152*   < >  --    < > 146* 147* 149*  K 3.8  --  4.1  --   --   --   --  4.4  --   CL 101  --  114*  --   --   --   --  108  --   CO2 22  --  17*  --   --   --   --  26  --   GLUCOSE 135*  --  145*  --   --   --   --  191*  --   BUN 21  --  33*  --   --   --   --  22  --   CREATININE 4.99*  --  6.63*  --   --   --   --  4.59*  --   CALCIUM  8.2*  --  8.8*  --   --   --   --  8.7*  --   PHOS 6.1*  --   --   --  6.8*  --   --  5.6*  --    < > = values in this interval not displayed.   Liver Function Tests: Recent Labs  Lab 07/27/24 1455  AST 21  ALT 22  ALKPHOS 514*  BILITOT 0.9  PROT 7.8  ALBUMIN 3.3*   CBC: Recent Labs  Lab  07/27/24 1455 07/27/24 2203 07/28/24 0408 07/29/24 0257 08/02/2024 0440  WBC 10.6*  --  14.8* 18.2* 14.7*  NEUTROABS 7.2  --   --   --   --   HGB 10.7*   < > 9.3* 9.2* 8.7*  HCT 33.4*   < > 28.7* 29.7* 27.6*  MCV 97.9  --  97.0 98.7 98.6  PLT 226  --  212 218 228   < > = values in this interval not displayed.   CBG: Recent Labs  Lab 07/29/24 1552 07/29/24 1923 07/29/24 2319 08/17/2024 0320 08/17/2024 0753  GLUCAP 109* 109* 133* 202* 151*  Medications:  amiodarone  60 mg/hr (08/08/2024 0944)   Followed by   amiodarone      cefTRIAXone  (ROCEPHIN )  IV Stopped (07/29/24 2346)   feeding supplement (OSMOLITE 1.5 CAL) 35 mL/hr at 08/07/2024 0901   norepinephrine  (LEVOPHED ) Adult infusion 18 mcg/min (08/21/2024 1117)   propofol  (DIPRIVAN ) infusion Stopped (08/02/2024 0708)   sodium chloride  23.4% injection Stopped (08/12/2024 0741)    atorvastatin   10 mg Per Tube Daily   Chlorhexidine  Gluconate Cloth  6 each Topical Daily   feeding supplement (PROSource TF20)  60 mL Per Tube Daily   leptospermum manuka honey  1 Application Topical Daily   levETIRAcetam   500 mg Intravenous Q12H   mouth rinse  15 mL Mouth Rinse Q2H   pantoprazole  (PROTONIX ) IV  40 mg Intravenous QHS   polyethylene glycol  17 g Per Tube Daily   senna-docusate  1 tablet Per Tube BID   Home bp meds: Irbesartan  150 every day Lopressor  100 bid Lopressor  25 bid Lasix  40mg  every day prn edema    OP HD: MWF [facility]  3.5h   84.5kg  B400   AVF   Heparin  none Mircera 30 micrograms on 8/02 Sensipar 30mg  three times per week Sod thiosulfate recently started for leg wounds   CXR 8/05 - vasc congestion, no edema K+ 3.8, bun 21, creat 4.99   Assessment/ Plan: IC bleed: w/ midline shift on imaging. SP 3% saline 1st 36 hrs. On 08/01/2024 patient found to have absence of reflexes. Stat CT showed showed stable bleed, but worsening midline shift, and significant cerebral swelling. Neurosurgery does not feel that operative  intervention would change her neuro outcome. Given a bolus of 23% Na+ this am.  Seizures/ status epilepticus: per neuro/ CCM ESRD: on HD MWF. HD ordered for 07/31/24.  HTN: bp's are low normal, defer BP control to ICU / neuro Volume: euvolemic on exam, 4 kg over dry wt, goal UF 2 L w/ HD. CXR w/ vasc congestion.  Anemia of esrd: Hb 9.2 here, follow, transfuse prn.  Afib - on IV diltiazem  for rate control   Belvie Och, NP 08/17/2024, 11:30 AM  [facility]   Pt seen, examined and agree w A/P as above.  Myer Fret MD  [facility] 08/22/2024, 5:22 PM

## 2024-08-24 NOTE — Progress Notes (Signed)
 Patient was pronounced dead on 08/26/24 at 08/31/00 by Damien Pringle, RN and Berneda Essex, RN. No belongings at bedside.

## 2024-08-24 NOTE — Progress Notes (Signed)
 Patient is bradycardic in the high 50's-60's. Is also more hypotensive 80's/30's. Notified E-link.

## 2024-08-24 NOTE — Progress Notes (Signed)
 Patient ID: Patricia Wu, female   DOB: 1956-11-15, 68 y.o.   MRN: [id number] I spoke at length with the family this afternoon. I explained that I do not expect neurological improvement. I explained that if the ventilatory support is terminated that Mrs. Bolinger will expire. We went over the imaging, and my rationale for not operatively evacuating the hematoma.  Family understood, I answered all questions about this very unfortunate situation.

## 2024-08-24 NOTE — Progress Notes (Signed)
 NAME:  Patricia Wu, MRN:  [id number], DOB:  1956-08-31, LOS: 3 ADMISSION DATE:  07/27/2024 CONSULTATION DATE:  07/27/2024 REFERRING MD:  Michaela - Neuro, CHIEF COMPLAINT:  R parietal IPH   History of Present Illness:  68 year old woman who presented to [facility] 8/4 as a transfer from [facility] for management of R parietal IPH. PMHx significant for HTN, HLD, Afib (on Coumadin ), ESRD on HD (MWF via LUE AVF).   Initially presented to [facility] ED 8/4 for RLE wound with associated pain and erythema.  Staff at dialysis center reportedly noted worsening erythema of the wound from prior. Concern for cellulitis. On ED arrival, patient was afebrile with HR 99, BP 149/87, RR 16, SpO2 98% on RA. Labs were notable for WBC 10.6, Hgb 10.7, Plt 226. INR 2.8 (on Coumadin ). Na 142, K 3.0, CO2 27, BUN/Cr 17/3.98. Transaminases WNL, Alk Phos 514, Tbili 0.9. LA 1.9. XR R Tib/Fib demonstrated soft tissue swelling, no concern for osteomyelitis. Keflex  initiated.  Patient was being prepared for discharge when ~1710 when daughter called for RN; patient reportedly had abrupt change in LOC with L facial droop, LUE weakness, drooling/dysarthria. Code Stroke called with CT Head demonstrating large R frontal parenchymal hematoma with surrounding edema/mass effect and 6mm leftward midline shift as well as acute R frontoparietal SDH (4mm thickness) with associated SAH over R frontal lobe. Neuro and NSGY consulted. Kcentra  and Vitamin K  administered. Esmolol  gtt started for rate/BP control. Intubated for airway protection. Patient deemed appropriate for transfer to [facility].  On arrival to [facility], repeat CT Head was obtained demonstrating large IPH in the R parietal lobe (6.3 x 5.3 x 4.4) with progressive edema and worsening mass effect of R lateral ventricle with early L ventricular entrapment and leftward midline shift of 5mm. HTS 3% started per Neurology.  PCCM consulted for management of ventilator and hemodynamics.  Pertinent Medical History:    Past Medical History:  Diagnosis Date   A-fib (HCC)    AC with Coumadin    ESRD on dialysis (HCC)    MWF via LUE AVF   Hypercholesteremia    Hypertension    Noncompliance with medication regimen    Significant Hospital Events: Including procedures, antibiotic start and stop dates in addition to other pertinent events   8/4 - Presented to [facility] ED with RLE wound. C/f cellulitis. Keflex  started. Preparing for discharge when patient had abrupt change in consciousness with L-sided facial droop, LUE weakness, dysarthria. Code Stroke called. Large R parietal IPH on CT. Intubated. Transferred to [facility] for further management. Repeat CT with worsening edema/hemorrhage. PCCM consulted for vent/BP management. 8/5 - febrile overnight to > 101. MRSA PCR neg. On vent. Fio2 40%. EEG ovenright c/w status. CT head unchanged  8/6 11 beat run of V. tach overnight, self-limiting  8/7 on a.m. assessment patient was seen with no cough no gag no corneal reflex emergent head CT with stable bleed but worsening midline shift up to 9 mm with effacement of bilateral sustains  Interim History / Subjective:  Unresponsive on ventilator with no cough, no gag, no corneal reflex  Objective:  Blood pressure 111/83, pulse 91, temperature (!) 97.3 F (36.3 C), resp. rate 18, height 5' 4 (1.626 m), weight 89 kg, SpO2 100%.    Vent Mode: PRVC FiO2 (%):  [30 %-40 %] 30 % Set Rate:  [18 bmp] 18 bmp Vt Set:  [430 mL] 430 mL PEEP:  [5 cmH20] 5 cmH20 Plateau Pressure:  [18 cmH20-23 cmH20] 22 cmH20   Intake/Output  Summary (Last 24 hours) at 08/22/2024 0748 Last data filed at 08/23/2024 0600 Gross per 24 hour  Intake 738.17 ml  Output 1632 ml  Net -893.83 ml   Filed Weights   07/27/24 1344 07/28/24 0500 07/29/24 1745  Weight: 84.6 kg 89 kg 89 kg   Physical Examination: General: Acute on chronic ill-appearing middle-aged female lying in bed on mechanical ventilation in no acute distress HEENT: ETT, MM pink/moist, pupils  fixed and dilated bilaterally Neuro: Unresponsive with no cough, no gag, no corneal reflex CV: s1s2 regular rate and rhythm, no murmur, rubs, or gallops,  PULM: Clear to auscultation bilaterally, no increased work of breathing, tolerating ventilator GI: soft, bowel sounds active in all 4 quadrants, non-tender, non-distended, tolerating TF Extremities: warm/dry, no edema  Skin: no rashes or lesions  Resolved Hospital Problem List:   Assessment & Plan:  Acute R parietal intraparenchymal hemorrhage with brain compression Status epileticus 8/4 - 07/28/24 -Initial CT Head demonstrating large R frontal parenchymal hematoma with surrounding edema/mass effect + 6mm leftward midline shift, acute R frontoparietal SDH (4mm thickness) with associated SAH over R frontal lobe.  -Repeat CT with large IPH in the R parietal lobe (6.3 x 5.3 x 4.4) with progressive edema and worsening mass effect of R lateral ventricle with early L ventricular entrapment and leftward midline shift of 5mm.  -Worsening neuroexam a.m. 8/7 including no cough, no gag, no corneal reflex repeat head CT with stable bleed but worsening midline shift P: Primary management per neurology/neurosurgery S/p a.m. bolus of 23% Maintain neuroprotective measures Nutrition and bowel regiment Seizure precautions Aspiration precautions Empiric Keppra   Acute respiratory insufficiency in the setting of acute IPH  -Requiring MV/airway protection 8/6 Concern of possible aspiration PNA  P: Continue ventilator support with lung protective strategies  Wean PEEP and FiO2 for sats greater than 90%. Head of bed elevated 30 degrees. Plateau pressures less than 30 cm H20.  Follow intermittent chest x-ray and ABG.   SAT/SBT as tolerated, mentation preclude extubation  Ensure adequate pulmonary hygiene  Follow cultures  VAP bundle in place  PAD protocol  Atrial fibrillation on Coumadin  with RVR - AC contraindicated in the setting of  IPH HTN HLD P: Continuous telemetry Optimize electrolytes  Ongoing issues with tachycardia and hypotension overnight likely to start IV amiodarone  drip in favor of Cardizem /beta-blocker Anticoagulation remains on hold  ESRD on HD P: Management per nephrology Volume removal per HD  Possible urinary tract infection -UA with rare bacteria but large leukocytes in setting of end-stage renal disease P: Empiric ceftriaxone  as above  Cellulitis RLE prior to admit P: Ceftriaxone  as above  Anemia  P: Trend CBC Transfuse per protocol Hemoglobin goal greater than 7  Best Practice: (right click and Reselect all SmartList Selections daily)   Diet/type: NPO DVT prophylaxis: SCD, otherwise contraindicated GI prophylaxis: PPI Lines: Central line, may require arterial line Foley:  Yes, and it is still needed Code Status:  full code Last date of multidisciplinary goals of care discussion: Pending   CRITICAL CARE Performed by: Dontrelle Mazon D. Harris   Total critical care time: 42 minutes  Critical care time was exclusive of separately billable procedures and treating other patients.  Critical care was necessary to treat or prevent imminent or life-threatening deterioration.  Critical care was time spent personally by me on the following activities: development of treatment plan with patient and/or surrogate as well as nursing, discussions with consultants, evaluation of patient's response to treatment, examination of patient, obtaining history from patient  or surrogate, ordering and performing treatments and interventions, ordering and review of laboratory studies, ordering and review of radiographic studies, pulse oximetry and re-evaluation of patient's condition.  Selden Noteboom D. Harris, NP-C [facility] Personal contact information can be found on Amion  If no contact or response made please call [phone] 07/29/2024, 7:48 AM

## 2024-08-24 NOTE — Progress Notes (Addendum)
 STROKE TEAM PROGRESS NOTE   SIGNIFICANT HOSPITAL EVENTS 8/4 - Presented to [facility] ED with RLE wound. C/f cellulitis. Keflex  started. Preparing for discharge when patient had abrupt change in consciousness with L-sided facial droop, LUE weakness, dysarthria. Code Stroke called. Large R parietal IPH on CT. Intubated. Transferred to [facility] for further management. Repeat CT with worsening edema/hemorrhage. PCCM consulted for vent/BP management.  8/7- Overnight, patient experienced multiple episodes of bradycardia and hypotension, requiring initiation of vasopressor support at 0455. At 0032, Na 146, restarted on 23.4%. During nursing assessment at [phone] patient was noted to have a significant change in neurological status, with no reflexes found on pupil, cornea, cough, gag, or response to pain.   SUBJECTIVE (INTERIM HISTORY) Patient's husband and daughter are at bedside. They were informed of patient's significant neurological decline. When asked if they wanted to proceed with additional imaging to confirm diagnosis, the husband stated he would prefer to wait for his wife's sisters and daughter arrive before making any decision. Dr. Gillie was consulted and continued to agree that operative intervention would not change neurological outcome. On examination, no cranial nerve reflex or spontaneous movements observed. No response to cold caloric testing and no response to noxious stimuli.   OBJECTIVE Vitals:   08/21/2024 1130 08/19/2024 1135 08/05/2024 1140 08/11/2024 1145  BP: (!) 145/106 (!) 156/128 128/81 (!) 127/106  Pulse: (!) 152 (!) 129 (!) 140 (!) 143  Resp: 18 18 18 18   Temp: 98.1 F (36.7 C) 98.1 F (36.7 C) 98.1 F (36.7 C) 98.2 F (36.8 C)  TempSrc:      SpO2: 100% 100% 100% 100%  Weight:      Height:         CBC    Component Value Date/Time   WBC 14.7 (H) 08/19/2024 0440   RBC 2.80 (L) 08/01/2024 0440   HGB 8.7 (L) 08/18/2024 0440   HCT 27.6 (L) 08/16/2024 0440   PLT 228 07/31/2024 0440    MCV 98.6 08/11/2024 0440   MCV 88.2 05/05/2014 0849   MCH 31.1 08/11/2024 0440   MCHC 31.5 08/10/2024 0440   RDW 15.4 08/11/2024 0440   LYMPHSABS 2.1 07/27/2024 1455   MONOABS 0.9 07/27/2024 1455   EOSABS 0.4 07/27/2024 1455   BASOSABS 0.0 07/27/2024 1455    BMET    Component Value Date/Time   NA 149 (H) 08/11/2024 1003   NA 146 (H) 02/08/2021 0950   K 4.4 08/10/2024 0440   CL 108 08/19/2024 0440   CO2 26 07/28/2024 0440   GLUCOSE 191 (H) 08/23/2024 0440   BUN 22 08/19/2024 0440   BUN 28 (H) 02/08/2021 0950   CREATININE 4.59 (H) 08/11/2024 0440   CREATININE 1.14 (H) 05/05/2014 0846   CALCIUM  8.7 (L) 08/02/2024 0440   GFRNONAA 10 (L) 07/28/2024 0440    IMAGING past 24 hours CT HEAD WO CONTRAST ( ) Result Date: 07/25/2024 EXAM: CT HEAD WITHOUT CONTRAST 07/29/2024 08:30:00 AM TECHNIQUE: CT of the head was performed without the administration of intravenous contrast. Automated exposure control, iterative reconstruction, and/or weight based adjustment of the mA/kV was utilized to reduce the radiation dose to as low as reasonably achievable. COMPARISON: CTs of the head dated 07/28/2024 and 07/27/2024. CLINICAL HISTORY: Mental status change, unknown cause. AMS. FINDINGS: BRAIN AND VENTRICLES: A large intraparenchymal hemorrhage is again demonstrated within the right frontal and parietal lobes measuring approximately 6.4 x 4.8 x 4.9 cm, which is overall similar in size to the most recent study, when it measured 6.3 x  5.3 x 4.4 cm. There is a 9 mm shift of the midline structures to the left and effacement of the right lateral ventricle. There is subfalcine and uncal herniation with of the basilar cisterns. There is a small intraventricular hemorrhage layering dependently within the posterior horn of the left lateral ventricle. ORBITS: No acute abnormality. SINUSES: No acute abnormality. SOFT TISSUES AND SKULL: No acute soft tissue abnormality. No skull fracture. IMPRESSION: 1. Large  intraparenchymal hemorrhage within the right frontal and parietal lobes, overall similar in size to the most recent study. 2. Associated 9 mm leftward midline shift, effacement of the right lateral ventricle, and effacement of the basilar cisterns. 3. Small intraventricular hemorrhage layering dependently within the posterior horn of the left lateral ventricle. Electronically signed by: evalene coho 08/01/2024 08:50 AM EDT RP Workstation: [id number]    PHYSICAL EXAM General:  Well-nourished, well-developed intubated on sedation CV: Regular rate and rhythm on monitor on vent Respiratory:  Regular, unlabored respirations on full vent support     NEURO: Intubated on sedation, on vent, rest with eyes closed and unable to respond to name or follow commands. Does not respond to tactile stimuli. Pupils are 7 mm, fixed, non-reactive. No corneal or pupillary light reflex. No gag reflex. Negative doll's eye. No response on noxious stimuli. All extremities flaccid. Absent cold caloric response. Sensation, coordination, and gait not tested.   Most Recent NIH  Dizziness Present: No (08/07 1200) Headache Present: No (08/07 1200) Interval: Other (Comment) (08/07 1200) Level of Consciousness (1a.)   : Responds only with reflex motor or autonomic effects or totally unresponsive, flaccid, and areflexic (08/07 1200) LOC Questions (1b. )   : Answers neither question correctly (08/07 1200) LOC Commands (1c. )   : Performs neither task correctly (08/07 1200) Best Gaze (2. )  : Forced deviation (08/07 1200) Visual (3. )  : Bilateral hemianopia (blind including cortical blindness) (08/07 1200) Facial Palsy (4. )    : Complete paralysis of one or both sides (08/07 1200) Motor Arm, Left (5a. )   : No movement (08/07 1200) Motor Arm, Right (5b. ) : No movement (08/07 1200) Motor Leg, Left (6a. )  : No movement (08/07 1200) Motor Leg, Right (6b. ) : No movement (08/07 1200) Limb Ataxia (7. ): Absent (08/07  1200) Sensory (8. )  : Severe to total sensory loss, patient is not aware of being touched in the face, arm, and leg (08/07 1200) Best Language (9. )  : Mute, global aphasia (08/07 1200) Dysarthria (10. ): Intubated or other physical barrier (08/07 1200) Extinction/Inattention (11.)   : Profound hemi-inattention or extinction to more than one modality (08/07 1200) Complete NIHSS TOTAL: 38 (08/07 1200)    ASSESSMENT/PLAN   Ms. Patricia Wu is a 68 y.o. female with history of ESRD, atrial fibrillation on warfarin, hyperlipidemia, hypercholesterolemia, HTN, and medication noncompliance admitted for acute onset of left sided weakness, left facial droop, drooling, anfd dysarthria shortly before discharge of RLE cellulitis.  NIH on Admission 20.  The patient suffered a profound neurological deterioration consistent with brain death.  Clinical findings are highly concerning for absence of brainstem function.  At this time, there are no cranial nerve reflexes or spontaneous movements.  Cool caloric testing is negative, supporting the suspicion of brain death.  After having a family meeting, the family decided to go ahead and proceed with additional CT scan.  Stat CT showed stable bleed, but worsening midline shift, and significant cerebral swelling.  Neurosurgery  does not feel that operative intervention with change or new outcome.   ICH:  right brain large ICH with midline shift, etiology likely chronic A Fib on coumadin  with HTN and ESRD Code Stroke CT head large right frontal parenchymal hematoma with surrounding edema, mass effect, and sulcal effacement.  6 mm leftward midline shift.  SAH over the right frontal lobe.  Acute right frontoparietal subdural hematoma 4 mm.  Partial effacement of the right lateral ventricle and third ventricle. CTA head & neck no extravasation visualized within the R parietal hematoma.   CT repeat unchanged appearance of IPH in the right parietal lobe with 5 mm at leftward  midline shift.  Dilation of the left lateral total ventricle atrium and temporal foreign are unchanged.  Blood in lateral ventricles is unchanged. 8/7 CT repeat stable bleed, but worsening midline shift, and significant cerebral swelling MRI 5.6 x 5.6 x 4.5 cm right frontoparietal hematoma with surrounding edema, effacement of the right lateral ventricle and 1 cm midline shift CT head large right brain hemorrhage, overall size similar.  9 mm midline shift and effacement of basilar cisterns.  Small VH. 2D Echo LVEF is 50-55%, abnormal mitral valve, moderate calcification of the aortic valve LDL 108 HgbA1c 5.6 UDS + for benzo VTE prophylaxis - SCDs warfarin daily prior to admission, now on No antithrombotic due to IPH Disposition:  pending, pt clinically brain death. Will plan for conformational test   Cerebral edema CT showed 5-6 mm MLS MRI 5.6 x 5.6 x 4.5 cm right frontal parietal hematoma with surrounding edema, effacement of the right lateral ventricle and 1 cm of midline shift On 3% saline @ 75 -> 23.4% Q6h PRN given ESRD on HD status Na 140-143-148-153 Na Q6h check NSG Dr. Gillie on board, no surgery at this time   Seizure like activity ? LTM EEG moderate to severe encephalopathy, no seizure will d/c LTM Continue keppra  500 bid Still on propofol    Atrial fibrillation with RVR Home Meds: Coumadin   S/p Kcentra  and VitK INR 2.8->1.6->1.8->1.7 Another vitK 10mg  IV x 1 8/7 again AFib RVR Off ditalizem gtt, now on amiodarone  IV Off metoprolol   No AC given ICH   Respiratory failure Intubated on vent Vent management per CCM Off propofol  since 7am 8/7   Hypertension Home meds:  metoprolol  tartate 100mg , metoprolol  tartate 25 mg Stable Off Levophed  Blood Pressure Goal: SBP less than 160    Hyperlipidemia Home meds: atorvastatin  10 mg LDL 108  Fever Leukocytosis Tmax 100.4--101.5--afebrile WBC 10.6--14.8--18.2 UA WBCs > 50 CXR no pulmonary infection On Rocephin     Dysphagia Patient has post-stroke dysphagia Now NPO Cortrak placed On TF @ 45   Other Stroke Risk Factors Obesity, Body mass index is 33.68 kg/m., BMI >/= 30 associated with increased stroke risk, recommend weight loss, diet and exercise as appropriate  Coronary artery disease   Other Active Problems ESRD on HD - nephrology signed off given clinically brain death CTA showed 4 mm posteriorly located aneurysm arising from the communicating segment of the R ICA - monitoring RLE open wound - wound care consult   Hospital day # 3   ATTENDING NOTE: I reviewed above note and agree with the assessment and plan. Pt was seen and examined.   Husband and daughter are at bedside.  Patient still intubated, on ventilation.  However no more response since early this morning.  On exam, no eye-opening on voice, with forced eye opening, pupil dilated 5 mm bilaterally no light reaction, no doll's  eyes, not blinking to visual threat, not tracking, no corneals, no gag and cough.  Cold caloric test also no nystagmus.  No movement on inspiration.  Patient clinically consistent with brain death.  Plan for further conformational test. I had long discussion with husband and daughter at bedside, updated pt current condition, treatment  and poor prognosis, and answered all the questions. They expressed understanding and appreciation.  They would like to further discuss with Dr. Gillie.  I also discussed with other family members in the hallway.   For detailed assessment and plan, please refer to above as I have made changes wherever appropriate.   Ary Cummins, MD PhD Stroke Neurology 08/21/2024 7:03 PM  This patient is critically ill due to large ICH, cerebral edema, respiratory failure, hypertensive emergency, ESRD on dialysis, A-fib RVR, fever and at significant risk of neurological worsening, death form brain herniation, brain death, heart failure, sepsis. This patient's care requires constant monitoring of  vital signs, hemodynamics, respiratory and cardiac monitoring, review of multiple databases, neurological assessment, discussion with family, other specialists and medical decision making of high complexity. I spent 40 minutes of neurocritical care time in the care of this patient.    To contact Stroke Continuity provider, please refer to [email or web address]. After hours, contact General Neurology

## 2024-08-24 NOTE — Progress Notes (Signed)
 Patient ID: Patricia Wu, female   DOB: 1956-04-06, 68 y.o.   MRN: [id number] BP 122/80   Pulse (!) 140   Temp (!) 96.4 F (35.8 C)   Resp 18   Ht 5' 4 (1.626 m)   Wt 89 kg   SpO2 100%   BMI 33.68 kg/m  Comatose, non reactive pupils. Fixed, dilated No corneals, no cough, no gag, no oculocephalics, no response to noxious stimuli CT showed no change in hemorrhage from MRI yesterday. However significant cerebral swelling appreciated Do not believe operative intervention at this time would change her neurological outcome.

## 2024-08-24 NOTE — Procedures (Signed)
 Extubation Procedure Note  Patient Details:   Name: Patricia Wu DOB: 12-29-1955 MRN: [id number]   Airway Documentation:    Vent end date: 08/02/2024 Vent end time: 2045   Evaluation  O2 sats: stable throughout Complications: No apparent complications Patient did tolerate procedure well. Bilateral Breath Sounds: Clear, Diminished   No  Pt extubated to 2L [place]. Comfort care. No complications noted.   Ronelle JONELLE Clause 08/08/2024, 8:59 PM

## 2024-08-24 NOTE — Progress Notes (Addendum)
 Noted on morning assessment that patient no longer has any reflexes. Tested corneal, cough, gag, and pupils. No response to pain either. Notified Dr. Jerri and Dr. Darnella. Awaiting new orders.   Received call from Dr. Darnella requesting that Dr. Lindalee be contacted instead. Attempted to reach Dr. Lindalee via cell but unsuccessful, called [facility] and went through paging service instead.

## 2024-08-24 NOTE — IPAL (Signed)
  Interdisciplinary Goals of Care Family Meeting   Date carried out 07/26/2024  Location of the meeting: Bedside.  Member's involved: Nurse Practitioner and Family Member or next of kin  Durable Power of Attorney or acting medical decision maker: Large group of loved ones with main decision maker being patients spouse     Discussion: After discussion clinical update with NSGY family has elected to change code status to DNR and plan to extubate to comfort soon. At this time they would like to spend time with patient and will relay to clinical team when they would like to precede with transition to comfort    Code status:   Code Status: Limited: Do not attempt resuscitation (DNR) -DNR-LIMITED -Do Not Intubate/DNI    Disposition: Continue current acute care  Time spent for the meeting: 25 ins    Arlind Klingerman D. Harris, NP  08/11/2024, 4:54 PM

## 2024-08-24 NOTE — Progress Notes (Signed)
 Late note entry 07/31/24, 9:07am Notified clinic of pt passing.   Lavanda Sigfredo Schreier Dialysis Navigator [phone]

## 2024-08-24 NOTE — Death Summary Note (Signed)
 DEATH SUMMARY   Patient Details  Name: Patricia Wu MRN: [id number] DOB: 1956/03/21  Admission/Discharge Information   Admit Date:  2024/08/17  Date of Death: Date of Death: 20-Aug-2024  Time of Death: Time of Death: 09/08/00  Length of Stay: 3  Referring Physician: Levora Reyes SAUNDERS, MD   Reason(s) for Hospitalization  ICH Diagnoses  Preliminary cause of death:  ICH: right brain large ICH with midline shift, etiology likely chronic A Fib on coumadin  with HTN and ESRD   Secondary Diagnoses (including complications and co-morbidities):  Cerebral edema Brain herniation Seizure like activity Afib with RVR Respiratory failure HTN HLD Fever with leukocytosis Dysphagia  Obesity CAD  ESRD on HD   Brief Hospital Course (including significant findings, care, treatment, and services provided and events leading to death)  Patricia Wu is a 68 y.o. year old female with history of ESRD, atrial fibrillation on warfarin, hyperlipidemia, hypercholesterolemia, HTN, and medication noncompliance admitted for acute onset of left sided weakness, left facial droop, drooling, anfd dysarthria shortly before discharge of RLE cellulitis.  NIH on Admission 20.  The patient suffered a profound neurological deterioration consistent with brain death.  Clinical findings are highly concerning for absence of brainstem function.  At this time, there are no cranial nerve reflexes or spontaneous movements.  Cool caloric testing is negative, supporting the suspicion of brain death.  After having a family meeting, the family decided to go ahead and proceed with additional CT scan.  Stat CT showed stable bleed, but worsening midline shift, and significant cerebral swelling.  Neurosurgery does not feel that operative intervention will change the outcome.   ICH:  right brain large ICH with midline shift, etiology likely chronic A Fib on coumadin  with HTN and ESRD Code Stroke CT head large right frontal parenchymal  hematoma with surrounding edema, mass effect, and sulcal effacement.  6 mm leftward midline shift.  SAH over the right frontal lobe.  Acute right frontoparietal subdural hematoma 4 mm.  Partial effacement of the right lateral ventricle and third ventricle. CTA head & neck no extravasation visualized within the R parietal hematoma.   CT repeat unchanged appearance of IPH in the right parietal lobe with 5 mm at leftward midline shift.  Dilation of the left lateral total ventricle atrium and temporal foreign are unchanged.  Blood in lateral ventricles is unchanged. 08-20-2024 CT repeat stable bleed, but worsening midline shift, and significant cerebral swelling MRI 5.6 x 5.6 x 4.5 cm right frontoparietal hematoma with surrounding edema, effacement of the right lateral ventricle and 1 cm midline shift CT head large right brain hemorrhage, overall size similar.  9 mm midline shift and effacement of basilar cisterns.  Small VH. 2D Echo LVEF is 50-55%, abnormal mitral valve, moderate calcification of the aortic valve LDL 108 HgbA1c 5.6 UDS + for benzo VTE prophylaxis - SCDs warfarin daily prior to admission, was on no antithrombotic due to IPH Disposition:  pt expired 08-20-24 09-08-2100   Cerebral edema CT showed 5-6 mm MLS MRI 5.6 x 5.6 x 4.5 cm right frontal parietal hematoma with surrounding edema, effacement of the right lateral ventricle and 1 cm of midline shift Was on 3% saline @ 75 -> 23.4% Q6h PRN given ESRD on HD status Na [id number] NSG Dr. Gillie on board, no surgery needed   Seizure like activity ? LTM EEG moderate to severe encephalopathy, no seizure Was on keppra  500 bid   Atrial fibrillation with RVR Home Meds: Coumadin   S/p Kcentra  and VitK  INR 2.8->1.6->1.8->1.7 Repeat vitK 10mg  IV x 1 08/29/2024 again AFib RVR Off ditalizem gtt, was on amiodarone  IV Off metoprolol   No AC given ICH   Respiratory failure Intubated on vent Managed by CCM Off propofol  since 7am Aug 29, 2024   Hypertension  with hypotension Home meds:  metoprolol  tartate 100mg , metoprolol  tartate 25 mg Off Levophed    Hyperlipidemia Home meds: atorvastatin  10 mg LDL 108   Fever Leukocytosis Tmax 100.4--101.5--afebrile WBC 10.6--14.8--18.2 UA WBCs > 50 CXR no pulmonary infection Was on Rocephin    Dysphagia Patient has post-stroke dysphagia NPO Had Cortrak placed Was on TF @ 45   Other Stroke Risk Factors Obesity, Body mass index is 33.68 kg/m., BMI >/= 30 associated with increased stroke risk, recommend weight loss, diet and exercise as appropriate  Coronary artery disease   Other Active Problems ESRD on HD - nephrology was on CTA showed 4 mm posteriorly located aneurysm arising from the communicating segment of the R ICA RLE open wound - wound care was consulted    Pertinent Labs and Studies  Significant Diagnostic Studies CT HEAD WO CONTRAST ( ) Result Date: 08/29/2024 EXAM: CT HEAD WITHOUT CONTRAST Aug 29, 2024 08:30:00 AM TECHNIQUE: CT of the head was performed without the administration of intravenous contrast. Automated exposure control, iterative reconstruction, and/or weight based adjustment of the mA/kV was utilized to reduce the radiation dose to as low as reasonably achievable. COMPARISON: CTs of the head dated 07/28/2024 and 07/27/2024. CLINICAL HISTORY: Mental status change, unknown cause. AMS. FINDINGS: BRAIN AND VENTRICLES: A large intraparenchymal hemorrhage is again demonstrated within the right frontal and parietal lobes measuring approximately 6.4 x 4.8 x 4.9 cm, which is overall similar in size to the most recent study, when it measured 6.3 x 5.3 x 4.4 cm. There is a 9 mm shift of the midline structures to the left and effacement of the right lateral ventricle. There is subfalcine and uncal herniation with of the basilar cisterns. There is a small intraventricular hemorrhage layering dependently within the posterior horn of the left lateral ventricle. ORBITS: No acute abnormality.  SINUSES: No acute abnormality. SOFT TISSUES AND SKULL: No acute soft tissue abnormality. No skull fracture. IMPRESSION: 1. Large intraparenchymal hemorrhage within the right frontal and parietal lobes, overall similar in size to the most recent study. 2. Associated 9 mm leftward midline shift, effacement of the right lateral ventricle, and effacement of the basilar cisterns. 3. Small intraventricular hemorrhage layering dependently within the posterior horn of the left lateral ventricle. Electronically signed by: evalene coho August 29, 2024 08:50 AM EDT RP Workstation: [id number]   Discontinue Long Term Monitor EEG Result Date: 07/29/2024 Maurine Jurist, MD     07/29/2024 10:45 AM Continuous Video-EEG Monitoring Report CPT/Type of Study: 95720 (EEG with video, 12-26 hours)      Indications for Procedure: Altered mental status Primary neurological diagnosis: G93.40; I61.9 Duration of study: 07/28/2024 12:45 to 07/29/2024 07:53 History: This is a 68 year old female presenting with acute altered mental status and left-sided weakness in the context of large intraparenchymal hematoma centered in the right parietal lobe. Continuous video-EEG monitoring was performed to evaluate for seizure. Technical Description: The EEG was performed using standard setting per the guidelines of American Clinical Neurophysiology Society (ACNS). A minimum of 21 electrodes were placed on scalp according to the International 10-20 or/and 10-10 Systems. Supplemental electrodes were placed as needed. Single EKG electrode was also used to detect cardiac arrhythmia. Patient's behavior was continuously recorded on video simultaneously with EEG. A minimum of 16 channels were used for  data display. Each epoch of study was reviewed manually daily and as needed using standard referential and bipolar montages. Computerized quantitative EEG analysis (such as compressed spectral array analysis, trending, automated spike & seizure detection) were used as  indicated. EEG Description: Epoch: 07/28/2024 12:45 to 07/29/2024 07:53 Background: There was generalized polymorphic delta and theta slowing. The polymorphic delta slowing was most prominent and persistent in the right frontotemporal region. No discernible posterior dominant rhythm or sleep architecture was recorded. Diffuse beta and spindle-like activity was present, most likely due to medication effect (propofol ). PERIODIC OR RHYTHMIC PATTERNS: None. EPILEPTIFORM DISCHARGES: None. SEIZURES: None. EVENTS: None. EKG: Atrial fibrillation was present. NOTE: Video-EEG monitoring was interrupted from 07:12 to 07:30 on 07/29/2024. Impression: This is an abnormal EEG due to the presence of the following: 1) Generalized polymorphic delta and theta slowing, suggesting moderate to severe encephalopathy, which is nonspecific as to etiology (medication effect of propofol  could not be ruled out); 2) Persistent focal polymorphic delta slowing in the right frontotemporal region, suggesting focal cerebral dysfunction or/and structural lesion. No epileptiform discharge or seizure is present. Compared with the last epoch, this epoch shows no significant change.    MR BRAIN WO CONTRAST Result Date: 07/29/2024 CLINICAL DATA:  Hemorrhagic infarct EXAM: MRI HEAD WITHOUT CONTRAST TECHNIQUE: Multiplanar, multiecho pulse sequences of the brain and surrounding structures were obtained without intravenous contrast. COMPARISON:  CT head July 28, 2024 FINDINGS: MRI brain: There is a 5.6 x 5.6 x 4.5 cm right frontoparietal hematoma with surrounding edema. There is 1 cm of right to left midline shift with effacement of the right lateral ventricle. Small amount of intraventricular hemorrhage. No larger ischemic infarct visualized No mass lesion. There are normal flow signals in the carotid arteries and basilar artery. No significant bone marrow signal abnormality. No significant abnormality in the paranasal sinuses or soft tissues.  IMPRESSION: 5.6 x 5.6 x 4.5 cm right frontal parietal hematoma with surrounding edema, effacement of the right lateral ventricle and 1 cm of midline shift Electronically Signed   By: Nancyann Burns M.D.   On: 07/29/2024 08:58   ECHOCARDIOGRAM COMPLETE Result Date: 07/28/2024    ECHOCARDIOGRAM REPORT   Patient Name:   Patricia Wu Date of Exam: 07/28/2024 Medical Rec #:  [id number]       Height:       64.0 in Accession #:    [id number]      Weight:       196.2 lb Date of Birth:  January 27, 1956      BSA:          1.941 m Patient Age:    26 years        BP:           103/63 mmHg Patient Gender: F               HR:           70 bpm. Exam Location:  Inpatient Procedure: 2D Echo, Cardiac Doppler and Color Doppler (Both Spectral and Color            Flow Doppler were utilized during procedure). Indications:    Stroke  History:        Patient has prior history of Echocardiogram examinations, most                 recent 03/05/2023. Arrythmias:Atrial Fibrillation; Risk                 Factors:Hypertension.  Sonographer:  Jayson Gaskins Referring Phys: [phone] MCNEILL P KIRKPATRICK IMPRESSIONS  1. Left ventricular ejection fraction, by estimation, is 50 to 55%. The left ventricle has low normal function. The left ventricle has no regional wall motion abnormalities. Left ventricular diastolic parameters are indeterminate.  2. Right ventricular systolic function is normal. The right ventricular size is mildly enlarged. Moderately increased right ventricular wall thickness.  3. Right atrial size was mildly dilated.  4. The mitral valve is abnormal. Trivial mitral valve regurgitation. No evidence of mitral stenosis.  5. The aortic valve is tricuspid. There is moderate calcification of the aortic valve. There is moderate thickening of the aortic valve. Aortic valve regurgitation is not visualized. Mild aortic valve stenosis.  6. The inferior vena cava is normal in size with greater than 50% respiratory variability, suggesting right atrial  pressure of 3 mmHg. FINDINGS  Left Ventricle: Left ventricular ejection fraction, by estimation, is 50 to 55%. The left ventricle has low normal function. The left ventricle has no regional wall motion abnormalities. Strain was performed and the global longitudinal strain is indeterminate. The left ventricular internal cavity size was normal in size. There is no left ventricular hypertrophy. Left ventricular diastolic parameters are indeterminate. Right Ventricle: The right ventricular size is mildly enlarged. Moderately increased right ventricular wall thickness. Right ventricular systolic function is normal. Left Atrium: Left atrial size was normal in size. Right Atrium: Right atrial size was mildly dilated. Pericardium: Trivial pericardial effusion is present. The pericardial effusion is posterior to the left ventricle. Mitral Valve: The mitral valve is abnormal. There is mild thickening of the mitral valve leaflet(s). There is mild calcification of the mitral valve leaflet(s). Trivial mitral valve regurgitation. No evidence of mitral valve stenosis. Tricuspid Valve: The tricuspid valve is normal in structure. Tricuspid valve regurgitation is mild . No evidence of tricuspid stenosis. Aortic Valve: The aortic valve is tricuspid. There is moderate calcification of the aortic valve. There is moderate thickening of the aortic valve. Aortic valve regurgitation is not visualized. Mild aortic stenosis is present. Aortic valve mean gradient measures 7.0 mmHg. Aortic valve peak gradient measures 10.0 mmHg. Aortic valve area, by VTI measures 1.51 cm. Pulmonic Valve: The pulmonic valve was normal in structure. Pulmonic valve regurgitation is trivial. No evidence of pulmonic stenosis. Aorta: The aortic root is normal in size and structure. Venous: The inferior vena cava is normal in size with greater than 50% respiratory variability, suggesting right atrial pressure of 3 mmHg. IAS/Shunts: No atrial level shunt detected by  color flow Doppler. Additional Comments: 3D was performed not requiring image post processing on an independent workstation and was indeterminate.  LEFT VENTRICLE PLAX 2D LVIDd:         5.00 cm LVIDs:         3.60 cm LV PW:         1.00 cm LV IVS:        0.90 cm LVOT diam:     1.80 cm LV SV:         48 LV SV Index:   25 LVOT Area:     2.54 cm  RIGHT VENTRICLE RV S prime:     13.20 cm/s TAPSE (M-mode): 1.2 cm LEFT ATRIUM             Index        RIGHT ATRIUM           Index LA Vol (A2C):   38.7 ml 19.94 ml/m  RA Area:  22.90 cm LA Vol (A4C):   63.5 ml 32.72 ml/m  RA Volume:   66.60 ml  34.32 ml/m LA Biplane Vol: 49.8 ml 25.66 ml/m  AORTIC VALVE AV Area (Vmax):    1.52 cm AV Area (Vmean):   1.26 cm AV Area (VTI):     1.51 cm AV Vmax:           158.00 cm/s AV Vmean:          124.000 cm/s AV VTI:            0.320 m AV Peak Grad:      10.0 mmHg AV Mean Grad:      7.0 mmHg LVOT Vmax:         94.20 cm/s LVOT Vmean:        61.400 cm/s LVOT VTI:          0.190 m LVOT/AV VTI ratio: 0.59  AORTA Ao Root diam: 2.70 cm MITRAL VALVE MV Area (PHT): 3.97 cm     SHUNTS MV Decel Time: 191 msec     Systemic VTI:  0.19 m MV E velocity: 123.00 cm/s  Systemic Diam: 1.80 cm Maude Emmer MD Electronically signed by Maude Emmer MD Signature Date/Time: 07/28/2024/3:06:02 PM    Final    Overnight EEG with video Result Date: 07/28/2024 Maurine Jurist, MD     07/28/2024 12:51 PM Continuous Video-EEG Monitoring Report CPT/Type of Study: 95720 (EEG with video, 12-26 hours)      Indications for Procedure: Altered mental status Primary neurological diagnosis: G93.40; I61.9 Duration of study: 07/27/2024 23:45 to 07/28/2024 12:45 History: This is a 68 year old female presenting with acute altered mental status and left-sided weakness in the context of large intraparenchymal hematoma centered in the right parietal lobe. Continuous video-EEG monitoring was performed to evaluate for seizure. Technical Description: The EEG was performed using  standard setting per the guidelines of American Clinical Neurophysiology Society (ACNS). A minimum of 21 electrodes were placed on scalp according to the International 10-20 or/and 10-10 Systems. Supplemental electrodes were placed as needed. Single EKG electrode was also used to detect cardiac arrhythmia. Patient's behavior was continuously recorded on video simultaneously with EEG. A minimum of 16 channels were used for data display. Each epoch of study was reviewed manually daily and as needed using standard referential and bipolar montages. Computerized quantitative EEG analysis (such as compressed spectral array analysis, trending, automated spike & seizure detection) were used as indicated. EEG Description: Epoch: 07/27/2024 23:45 to 07/28/2024 12:45 Background: There was generalized polymorphic delta and theta slowing. The polymorphic delta slowing was most prominent and persistent in the right frontotemporal region. No discernible posterior dominant rhythm or sleep architecture was recorded. Diffuse beta and spindle-like activity was present, most likely due to medication effect (propofol , benzodiazepine). PERIODIC OR RHYTHMIC PATTERNS: None. EPILEPTIFORM DISCHARGES: None. SEIZURES: None. EVENTS: None. EKG: Atrial fibrillation was present. Impression: This is an abnormal EEG due to the presence of the following: 1) Generalized polymorphic delta and theta slowing, suggesting moderate to severe encephalopathy, which is nonspecific as to etiology (medication effect of propofol  and benzodiazepine could not be ruled out); 2) Persistent focal polymorphic delta slowing in the right frontotemporal region, suggesting focal cerebral dysfunction or/and structural lesion. No epileptiform discharge or seizure is present.     CT ANGIO HEAD W OR WO CONTRAST Result Date: 07/28/2024 EXAM: CTA Head Without and With Intravenous Contrast CLINICAL HISTORY: Stroke, hemorrhagic; Assess for spot sign. CT HEAD POST STROKE  FOLLOWUP/TIMED/STAT READ; Neuro deficit, acute,  stroke suspected; CT ANGIO HEAD W OR WO CONTRAST; Stroke, hemorrhagic, Assess for spot sign TECHNIQUE: Axial CTA images of the head without and with intravenous contrast. MIP reconstructed images were created and reviewed. Dose reduction technique was used including one or more of the following: automated exposure control, adjustment of mA and kV according to patient size, and/or iterative reconstruction. CONTRAST: 75 mL iohexol  (OMNIPAQUE ) 350 MG/ML injection 75 mL IOHEXOL  350 MG/ML SOLN COMPARISON: 07/27/2024 FINDINGS: INTERNAL CAROTID ARTERIES: There is a 4 mm posteriorly directed aneurysm arising from the communicating segment of the right ICA. There is no extravasation visualized within the right parietal hematoma. ANTERIOR CEREBRAL ARTERIES: No significant stenosis. No occlusion. No aneurysm. MIDDLE CEREBRAL ARTERIES: No significant stenosis. No occlusion. No aneurysm. POSTERIOR CEREBRAL ARTERIES: No significant stenosis. No occlusion. No aneurysm. BASILAR ARTERY: No significant stenosis. No occlusion. No aneurysm. VERTEBRAL ARTERIES: No significant stenosis. No occlusion. No aneurysm. SOFT TISSUES: No acute finding. No masses or lymphadenopathy. BONES: No acute osseous abnormality. IMPRESSION: 1. No extravasation visualized within the right parietal hematoma. 2. 4 mm posteriorly directed aneurysm arising from the communicating segment of the right ICA. Electronically signed by: Franky Stanford MD 07/28/2024 03:35 AM EDT RP Workstation: [id number]   CT HEAD POST STROKE FOLLOWUP/TIMED/STAT READ Result Date: 07/28/2024 EXAM: CT HEAD WITHOUT CONTRAST 07/28/2024 03:11:33 AM TECHNIQUE: CT of the head was performed without the administration of intravenous contrast. Automated exposure control, iterative reconstruction, and/or weight based adjustment of the mA/kV was utilized to reduce the radiation dose to as low as reasonably achievable. COMPARISON: 07/27/2024 at 8:32 PM  CLINICAL HISTORY: Neuro deficit, acute, stroke suspected. FINDINGS: BRAIN AND VENTRICLES: Unchanged appearance of intraparenchymal hematoma centered in the right parietal lobe with 5 mm of leftward midline shift. Small amount of blood in the lateral ventricles is unchanged. Dilatation of the left lateral ventricle atrium and temporal horn are unchanged. ORBITS: No acute abnormality. SINUSES: No acute abnormality. SOFT TISSUES AND SKULL: No acute soft tissue abnormality. No skull fracture. IMPRESSION: 1. Unchanged appearance of intraparenchymal hematoma centered in the right parietal lobe with 5 mm of leftward midline shift. 2. Small amount of blood in the lateral ventricles is unchanged. 3. Dilatation of the left lateral ventricle atrium and temporal horn are unchanged. Electronically signed by: Franky Stanford MD 07/28/2024 03:24 AM EDT RP Workstation: [id number]   DG CHEST PORT 1 VIEW Result Date: 07/28/2024 CLINICAL DATA:  Central line EXAM: PORTABLE CHEST 1 VIEW COMPARISON:  07/27/2024, 05/07/2023 FINDINGS: Endotracheal tube tip just above the carina. There is prominent inflation of the endotracheal tube cuff. Enteric tube tip below the diaphragm but incompletely visualized. New left IJ central venous catheter tip at the cavoatrial junction. Cardiomegaly with vascular congestion IMPRESSION: 1. Endotracheal tube tip just above the carina. There is prominent inflation of the endotracheal tube cuff. 2. New left IJ central venous catheter tip at the cavoatrial junction. No pneumothorax. 3. Cardiomegaly with vascular congestion. Electronically Signed   By: Luke Bun M.D.   On: 07/28/2024 01:00   CT HEAD WO CONTRAST ( ) Result Date: 07/27/2024 EXAM: CT HEAD WITHOUT 07/27/2024 08:38:38 PM TECHNIQUE: CT of the head was performed without the administration of intravenous contrast. Automated exposure control, iterative reconstruction, and/or weight based adjustment of the mA/kV was utilized to reduce the radiation  dose to as low as reasonably achievable. COMPARISON: 07/27/24 5:35 PM CLINICAL HISTORY: Stroke, follow up. FINDINGS: BRAIN AND VENTRICLES: Large intraparenchymal hematoma centered in the right parietal lobe. Progressive edema surrounding the hemorrhage site with  worsened mass effect on the right lateral ventricle. There is early evidence of left ventricular entrapment with a dilated atrium and left temporal horn. Leftward midline shift has worsened to 5 mm. The hemorrhage now measures 6.3 x 5.3 x 4.4 cm, previously 5.5 x 4.6 x 4.1 cm. ORBITS: No acute abnormality. SINUSES AND MASTOIDS: No acute abnormality. SOFT TISSUES AND SKULL: No acute skull fracture. No acute soft tissue abnormality. IMPRESSION: 1. Large intraparenchymal hematoma centered in the right parietal lobe, now measuring 6.3 x 5.3 x 4.4 cm (previously 5.5 x 4.6 x 4.1 cm), with progressive edema and worsened mass effect on the right lateral ventricle. 2. Early evidence of left ventricular entrapment with a dilated atrium and left temporal horn. 3. Leftward midline shift has worsened to 5 mm. Electronically signed by: Franky Stanford MD 07/27/2024 08:49 PM EDT RP Workstation: [id number]   DG Chest Portable 1 View Result Date: 07/27/2024 CLINICAL DATA:  Endotracheal and nasogastric tube placements. EXAM: PORTABLE CHEST 1 VIEW COMPARISON:  05/07/2023. FINDINGS: Endotracheal tube terminates either 1.7 cm above the carina or at the orifice of the right mainstem bronchus. There is a double vertical linear density in the trachea, making exact assessment difficult. Nasogastric tube is followed into the stomach with the tip projecting beyond the inferior margin of the image. Heart is enlarged. Thoracic aorta is calcified. Diffuse interstitial prominence. IMPRESSION: 1. Double linear density in the trachea makes assessment for endotracheal tube position challenging. Recommend repeat chest radiograph with attention to tubes and lines in this area, to clear any  possible external tubing that is complicating assessment. 2. Nasogastric tube terminates in the stomach. 3. Pulmonary edema. Electronically Signed   By: Newell Eke M.D.   On: 07/27/2024 20:00   CT HEAD CODE STROKE WO CONTRAST Result Date: 07/27/2024 EXAM: CT HEAD WITHOUT CONTRAST 07/27/2024 05:34:53 PM TECHNIQUE: CT of the head was performed without the administration of intravenous contrast. Automated exposure control, iterative reconstruction, and/or weight based adjustment of the mA/kV was utilized to reduce the radiation dose to as low as reasonably achievable. COMPARISON: None available. CLINICAL HISTORY: Neuro deficit, acute, stroke suspected. After recheck for d/c w patient in [facility], she was found to have L facial droop w dysarthria and L UE weakness. Code Stroke activated. LSN 1710. FINDINGS: BRAIN AND VENTRICLES: Large parenchymal hematoma centered in the right frontal lobe measuring 5.5 x 4.6 x 4.1 cm. Surrounding edema with mass effect and sulcal effacement throughout the right cerebral hemisphere. There are few areas of subarachnoid hemorrhage over the right frontal lobe. There is partial effacement of the right lateral ventricle and partial effacement of the third ventricle. Approximately 6 mm leftward midline shift. Crowding of the right aspect of the suprasellar cistern. ORBITS: No acute abnormality. SINUSES: No acute abnormality. SOFT TISSUES AND SKULL: Acute subdural hematoma over the right frontoparietal convexity measuring up to 4 mm in thickness. Skull base atherosclerosis involving the carotid siphons and intracranial vertebral arteries. IMPRESSION: 1. Large right frontal parenchymal hematoma with surrounding edema, mass effect, and sulcal effacement. 6mm leftward midline shift. 2. Acute right frontoparietal convexity subdural hematoma measuring up to 4 mm in thickness. 3. Subarachnoid hemorrhage over the right frontal lobe. 4. Partial effacement of the right lateral ventricle and third  ventricle. 5. Findings called to Dr. Garrick at 5:45 PM on 07/27/24. Electronically signed by: Donnice Mania MD 07/27/2024 05:48 PM EDT RP Workstation: [id number]   DG Tibia/Fibula Right Result Date: 07/27/2024 CLINICAL DATA:  wound, medial aspect, family request to eval  for deep space infection EXAM: RIGHT TIBIA AND FIBULA - 2 VIEW COMPARISON:  None Available. FINDINGS: No acute fracture or dislocation. There is no evidence of arthropathy or other focal bone abnormality. Soft tissue swelling about the calf. Peripheral vascular atherosclerosis. IMPRESSION: Soft tissue swelling about the calf. No radiographic findings of osteomyelitis, at this time. Electronically Signed   By: Rogelia Myers M.D.   On: 07/27/2024 16:51    Microbiology No results found for this or any previous visit (from the past 240 hours).  Lab Basic Metabolic Panel: No results for input(s): NA, K, CL, CO2, GLUCOSE, BUN, CREATININE, CALCIUM , MG, PHOS in the last 168 hours. Liver Function Tests: No results for input(s): AST, ALT, ALKPHOS, BILITOT, PROT, ALBUMIN in the last 168 hours. No results for input(s): LIPASE, AMYLASE in the last 168 hours. No results for input(s): AMMONIA in the last 168 hours. CBC: No results for input(s): WBC, NEUTROABS, HGB, HCT, MCV, PLT in the last 168 hours. Cardiac Enzymes: No results for input(s): CKTOTAL, CKMB, CKMBINDEX, TROPONINI in the last 168 hours. Sepsis Labs: No results for input(s): PROCALCITON, WBC, LATICACIDVEN in the last 168 hours.  Procedures/Operations  Intubation and extubation   Ary Cummins 08/14/2024, 11:16 AM

## 2024-08-24 NOTE — Progress Notes (Addendum)
 eLink Physician-Brief Progress Note Patient Name: Patricia Wu DOB: 1956/04/18 MRN: [id number]   Date of Service  08/02/2024  HPI/Events of Note  Had a discussion with the family at bedside.  The patient has evidence of clinical brain death and was awaiting confirmatory testing.  After extensive goals of care conversations today, the family requested that the patient be transition to comfort measures with the withdrawal of ventilator and life support measures.  eICU Interventions  We again discussed what a transition to comfort measures includes including the withdrawal of the ventilator, vasopressors, and other supportive measures.  The family understood the risk, benefits, and alternatives to this change in therapy.  Orders were updated to reflect comfort oriented care.   2117 - TOD 2101  Intervention Category Minor Interventions: Routine modifications to care plan (e.g. PRN medications for pain, fever)  Mitzy Naron 08/20/2024, 8:26 PM

## 2024-08-24 NOTE — Progress Notes (Signed)
 Patient was transported to CT @0816  and transported back to 4N21 @0836  via the ventilator with no complications.

## 2024-08-24 NOTE — Consult Note (Signed)
 Palliative Care Consult Note                                  Date: 08/21/2024   Patient Name: Patricia Wu  DOB: 14-Feb-1956  MRN: [id number]  Age / Sex: 68 y.o., female  PCP: Patricia Reyes SAUNDERS, MD Referring Physician: Stroke, Md, MD  Reason for Consultation: Establishing goals of care  Past Medical History:  Diagnosis Date   A-fib [facility])    AC with Coumadin    ESRD on dialysis (HCC)    MWF via LUE AVF   Hypercholesteremia    Hypertension    Noncompliance with medication regimen     Subjective:   This NP Patricia Wu reviewed medical records, received report from team, assessed the patient and then meet at the patient's bedside to discuss diagnosis, prognosis, GOC, EOL wishes disposition and options.  Before meeting with the patient/family, I spent time reviewing the chart notes including admission H&P, and multiple neurology notes over the past couple days for multiple PCCM notes, multiple RN notes, multiple neurosurgery notes.  I also specifically reviewed nursing notes from today, PCCM note from today, neurosurgery note from today, neurology note from today, nephrology note from today. I also reviewed vital signs, nursing flowsheets, medication administrations record, labs, and imaging. Labs reviewed include BMP which shows stable hyponatremia at 147, improved creatinine 4.59 today (6.63 yesterday) which is consistent with receiving dialysis, CBC showing improvement in leukocytosis to 14.7, stable anemia with hemoglobin 8.7, normal lactic acid at 1.8.  I also reviewed CT scans from this admission including CT scan completed this morning.  I also looked at the images to compare differences between CT scan this morning versus CT scan 07/28/2024.  I met with the patient at the bedside.  I also spoke with the patient's husband in the waiting room while they were waiting for family meeting with neurosurgery.   We meet to discuss diagnosis  prognosis, GOC, EOL wishes, disposition and options. Concept of Palliative Care was introduced as specialized medical care for people and their families living with serious illness.  If focuses on providing relief from the symptoms and stress of a serious illness.  The goal is to improve quality of life for both the patient and the family. Values and goals of care important to patient and family were attempted to be elicited.  Created space and opportunity for patient  and family to explore thoughts and feelings regarding current medical situation   Natural trajectory and current clinical status were discussed. Questions and concerns addressed. Patient  encouraged to call with questions or concerns.    Today's Discussion: Prior to seeing the patient and speaking with family I spoke with the bedside nurse and PCCM nurse practitioner extensively.  The patient is here with a large intracranial hemorrhage and yesterday neurology noted potential of mild survivability.  Neurosurgery with no surgical intervention to offer at this time.  However, this morning the patient lost all reflexes and now appears to be potentially clinically brain-dead.  Neurosurgery scheduled a family meeting for 2:00 this afternoon.   I went to the waiting room to speak to the patient's husband.  I introduced myself as a member of the palliative medicine team and explained the role of palliative medicine.  I shared that I would attempt to be at the prescheduled family meeting at 2:00 with neurosurgery to offer support and help wait through the information  presented to help decide how to best take care of of their loved one.  Unfortunately the family meeting was delayed and due to her previously scheduled family meeting with another patient I was unable to attend the meeting.  Afterward I debriefed with the neurologist Patricia Wu.  At this point the patient appears to be clinically brain-dead, family meeting went well.  Indicated no need for  palliative to continue to follow.  Review of Systems  Unable to perform ROS   Objective:   Primary Diagnoses: Present on Admission:  (Resolved) CVA (cerebrovascular accident due to intracerebral hemorrhage) (HCC)  ICH (intracerebral hemorrhage) (HCC)   Vital Signs:  BP 125/86   Pulse (!) 131   Temp 99 F (37.2 C)   Resp 18   Ht 5' 4 (1.626 m)   Wt 89 kg   SpO2 100%   BMI 33.68 kg/m   Physical Exam Vitals and nursing note reviewed.  Constitutional:      General: She is sleeping. She is not in acute distress. Cardiovascular:     Rate and Rhythm: Tachycardia present.  Pulmonary:     Effort: Pulmonary effort is normal.  Abdominal:     General: Abdomen is flat. There is no distension.     Palpations: Abdomen is soft.  Skin:    General: Skin is warm and dry.  Neurological:     Mental Status: She is unresponsive.     Palliative Assessment/Data: 10%   Assessment & Plan:   HPI/Patient Profile: 68 y.o. female  with past medical history of HTN, HLD, Afib (on Coumadin ), ESRD on HD (MWF via LUE AVF) who presented [facility] as a transfer from [facility] for management of R parietal IPH.  She was admitted on 07/27/2024 with acute right parietal intraparenchymal hemorrhage with brain compression, status epilepticus, acute respiratory failure in the setting of acute IPH, and others.   Palliative medicine was consulted for GOC conversations.  SUMMARY OF RECOMMENDATIONS   Full code Full scope of care Ongoing supportive patient and family Palliative medicine will sign off per request from neurology Please notify us  of any further palliative needs  Symptom Management:  Per primary team PMT is available to assist as needed  Code Status: Full Code  Prognosis:  Unable to determine  Discharge Planning:  To Be Determined   Discussed with: Patient's family, medical team, nursing team    Thank you for allowing us  to participate in the care of Patricia Wu PMT will  continue to support holistically.  Billing based on MDM: High  Problems Addressed: One acute or chronic illness or injury that poses a threat to life or bodily function  Amount and/or Complexity of Data: Category 1:Review of prior external note(s) from each unique source, Review of the result(s) of each unique test, and Assessment requiring an independent historian(s) and Category 3:Discussion of management or test interpretation with external physician/other qualified health care professional/appropriate source (not separately reported)  Risks: N/A  Detailed review of medical records (labs, imaging, vital signs), medically appropriate exam, discussed with treatment team, counseling and education to patient, family, & staff, documenting clinical information, medication management, coordination of care  Signed by: Patricia Kays, NP Palliative Medicine Team  Team Phone # [phone] (Nights/Weekends)  08/07/2024, 2:07 PM

## 2024-08-24 NOTE — Progress Notes (Signed)
 Subjective: I was approached by this patient's nurse, Cara, while I was in the unit.  She was assessed this morning and found to have a sudden change in her neurologic status.  A call has plan placed to Dr. Cabbell.  Objective: Vital signs in last 24 hours: Temp:  [96.8 F (36 C)-100 F (37.8 C)] 97.3 F (36.3 C) 2024-08-12 0700) Pulse Rate:  [65-150] 91 2024-08-12 0700) Resp:  [18-38] 18 08/12/24 0700) BP: (68-149)/(35-125) 111/83 12-Aug-2024 0700) SpO2:  [91 %-100 %] 100 % 08-12-2024 0700) FiO2 (%):  [30 %-40 %] 30 % 08-12-24 0749) Weight:  [89 kg] 89 kg (08/06 1745) Estimated body mass index is 33.68 kg/m as calculated from the following:   Height as of this encounter: 5' 4 (1.626 m).   Weight as of this encounter: 89 kg.   Intake/Output from previous day: 08/06 0701 - 08/12/2024 0700 In: 738.2 [I.V.:207.4; NG/GT:390.8; IV Piggyback:140] Out: 1632 [Urine:132] Intake/Output this shift: No intake/output data recorded.  Physical exam the patient pupils are 7 mm and nonreactive bilaterally.  She has no response to painful stimuli, Glasgow Coma Scale 3.  She has no corneal or gag reflexes.  I reviewed the patient's head CT and brain MRI.  She has a large right hemorrhage with midline shift.  There is no significant hydrocephalus at that time.  Lab Results: Recent Labs    07/29/24 0257 12-Aug-2024 0440  WBC 18.2* 14.7*  HGB 9.2* 8.7*  HCT 29.7* 27.6*  PLT 218 228   BMET Recent Labs    07/29/24 0257 07/29/24 0926 07/29/24 2222 08/12/24 0440  NA 152*   < > 146* 147*  K 4.1  --   --  4.4  CL 114*  --   --  108  CO2 17*  --   --  26  GLUCOSE 145*  --   --  191*  BUN 33*  --   --  22  CREATININE 6.63*  --   --  4.59*  CALCIUM  8.8*  --   --  8.7*   < > = values in this interval not displayed.    Studies/Results: Discontinue Long Term Monitor EEG Result Date: 07/29/2024 Maurine Jurist, MD     07/29/2024 10:45 AM Continuous Video-EEG Monitoring Report CPT/Type of Study: 95720 (EEG with video, 12-26  hours)      Indications for Procedure: Altered mental status Primary neurological diagnosis: G93.40; I61.9 Duration of study: 07/28/2024 12:45 to 07/29/2024 07:53 History: This is a 68 year old female presenting with acute altered mental status and left-sided weakness in the context of large intraparenchymal hematoma centered in the right parietal lobe. Continuous video-EEG monitoring was performed to evaluate for seizure. Technical Description: The EEG was performed using standard setting per the guidelines of American Clinical Neurophysiology Society (ACNS). A minimum of 21 electrodes were placed on scalp according to the International 10-20 or/and 10-10 Systems. Supplemental electrodes were placed as needed. Single EKG electrode was also used to detect cardiac arrhythmia. Patient's behavior was continuously recorded on video simultaneously with EEG. A minimum of 16 channels were used for data display. Each epoch of study was reviewed manually daily and as needed using standard referential and bipolar montages. Computerized quantitative EEG analysis (such as compressed spectral array analysis, trending, automated spike & seizure detection) were used as indicated. EEG Description: Epoch: 07/28/2024 12:45 to 07/29/2024 07:53 Background: There was generalized polymorphic delta and theta slowing. The polymorphic delta slowing was most prominent and persistent in the right frontotemporal region. No  discernible posterior dominant rhythm or sleep architecture was recorded. Diffuse beta and spindle-like activity was present, most likely due to medication effect (propofol ). PERIODIC OR RHYTHMIC PATTERNS: None. EPILEPTIFORM DISCHARGES: None. SEIZURES: None. EVENTS: None. EKG: Atrial fibrillation was present. NOTE: Video-EEG monitoring was interrupted from 07:12 to 07:30 on 07/29/2024. Impression: This is an abnormal EEG due to the presence of the following: 1) Generalized polymorphic delta and theta slowing, suggesting  moderate to severe encephalopathy, which is nonspecific as to etiology (medication effect of propofol  could not be ruled out); 2) Persistent focal polymorphic delta slowing in the right frontotemporal region, suggesting focal cerebral dysfunction or/and structural lesion. No epileptiform discharge or seizure is present. Compared with the last epoch, this epoch shows no significant change.    MR BRAIN WO CONTRAST Result Date: 07/29/2024 CLINICAL DATA:  Hemorrhagic infarct EXAM: MRI HEAD WITHOUT CONTRAST TECHNIQUE: Multiplanar, multiecho pulse sequences of the brain and surrounding structures were obtained without intravenous contrast. COMPARISON:  CT head July 28, 2024 FINDINGS: MRI brain: There is a 5.6 x 5.6 x 4.5 cm right frontoparietal hematoma with surrounding edema. There is 1 cm of right to left midline shift with effacement of the right lateral ventricle. Small amount of intraventricular hemorrhage. No larger ischemic infarct visualized No mass lesion. There are normal flow signals in the carotid arteries and basilar artery. No significant bone marrow signal abnormality. No significant abnormality in the paranasal sinuses or soft tissues. IMPRESSION: 5.6 x 5.6 x 4.5 cm right frontal parietal hematoma with surrounding edema, effacement of the right lateral ventricle and 1 cm of midline shift Electronically Signed   By: Nancyann Burns M.D.   On: 07/29/2024 08:58   ECHOCARDIOGRAM COMPLETE Result Date: 07/28/2024    ECHOCARDIOGRAM REPORT   Patient Name:   ROSELEE TAYLOE Date of Exam: 07/28/2024 Medical Rec #:  [id number]       Height:       64.0 in Accession #:    [id number]      Weight:       196.2 lb Date of Birth:  March 05, 1956      BSA:          1.941 m Patient Age:    71 years        BP:           103/63 mmHg Patient Gender: F               HR:           70 bpm. Exam Location:  Inpatient Procedure: 2D Echo, Cardiac Doppler and Color Doppler (Both Spectral and Color            Flow Doppler were utilized  during procedure). Indications:    Stroke  History:        Patient has prior history of Echocardiogram examinations, most                 recent 03/05/2023. Arrythmias:Atrial Fibrillation; Risk                 Factors:Hypertension.  Sonographer:    Jayson Gaskins Referring Phys: [phone] MCNEILL P KIRKPATRICK IMPRESSIONS  1. Left ventricular ejection fraction, by estimation, is 50 to 55%. The left ventricle has low normal function. The left ventricle has no regional wall motion abnormalities. Left ventricular diastolic parameters are indeterminate.  2. Right ventricular systolic function is normal. The right ventricular size is mildly enlarged. Moderately increased right ventricular wall thickness.  3. Right atrial size was  mildly dilated.  4. The mitral valve is abnormal. Trivial mitral valve regurgitation. No evidence of mitral stenosis.  5. The aortic valve is tricuspid. There is moderate calcification of the aortic valve. There is moderate thickening of the aortic valve. Aortic valve regurgitation is not visualized. Mild aortic valve stenosis.  6. The inferior vena cava is normal in size with greater than 50% respiratory variability, suggesting right atrial pressure of 3 mmHg. FINDINGS  Left Ventricle: Left ventricular ejection fraction, by estimation, is 50 to 55%. The left ventricle has low normal function. The left ventricle has no regional wall motion abnormalities. Strain was performed and the global longitudinal strain is indeterminate. The left ventricular internal cavity size was normal in size. There is no left ventricular hypertrophy. Left ventricular diastolic parameters are indeterminate. Right Ventricle: The right ventricular size is mildly enlarged. Moderately increased right ventricular wall thickness. Right ventricular systolic function is normal. Left Atrium: Left atrial size was normal in size. Right Atrium: Right atrial size was mildly dilated. Pericardium: Trivial pericardial effusion is present.  The pericardial effusion is posterior to the left ventricle. Mitral Valve: The mitral valve is abnormal. There is mild thickening of the mitral valve leaflet(s). There is mild calcification of the mitral valve leaflet(s). Trivial mitral valve regurgitation. No evidence of mitral valve stenosis. Tricuspid Valve: The tricuspid valve is normal in structure. Tricuspid valve regurgitation is mild . No evidence of tricuspid stenosis. Aortic Valve: The aortic valve is tricuspid. There is moderate calcification of the aortic valve. There is moderate thickening of the aortic valve. Aortic valve regurgitation is not visualized. Mild aortic stenosis is present. Aortic valve mean gradient measures 7.0 mmHg. Aortic valve peak gradient measures 10.0 mmHg. Aortic valve area, by VTI measures 1.51 cm. Pulmonic Valve: The pulmonic valve was normal in structure. Pulmonic valve regurgitation is trivial. No evidence of pulmonic stenosis. Aorta: The aortic root is normal in size and structure. Venous: The inferior vena cava is normal in size with greater than 50% respiratory variability, suggesting right atrial pressure of 3 mmHg. IAS/Shunts: No atrial level shunt detected by color flow Doppler. Additional Comments: 3D was performed not requiring image post processing on an independent workstation and was indeterminate.  LEFT VENTRICLE PLAX 2D LVIDd:         5.00 cm LVIDs:         3.60 cm LV PW:         1.00 cm LV IVS:        0.90 cm LVOT diam:     1.80 cm LV SV:         48 LV SV Index:   25 LVOT Area:     2.54 cm  RIGHT VENTRICLE RV S prime:     13.20 cm/s TAPSE (M-mode): 1.2 cm LEFT ATRIUM             Index        RIGHT ATRIUM           Index LA Vol (A2C):   38.7 ml 19.94 ml/m  RA Area:     22.90 cm LA Vol (A4C):   63.5 ml 32.72 ml/m  RA Volume:   66.60 ml  34.32 ml/m LA Biplane Vol: 49.8 ml 25.66 ml/m  AORTIC VALVE AV Area (Vmax):    1.52 cm AV Area (Vmean):   1.26 cm AV Area (VTI):     1.51 cm AV Vmax:           158.00 cm/s  AV  Vmean:          124.000 cm/s AV VTI:            0.320 m AV Peak Grad:      10.0 mmHg AV Mean Grad:      7.0 mmHg LVOT Vmax:         94.20 cm/s LVOT Vmean:        61.400 cm/s LVOT VTI:          0.190 m LVOT/AV VTI ratio: 0.59  AORTA Ao Root diam: 2.70 cm MITRAL VALVE MV Area (PHT): 3.97 cm     SHUNTS MV Decel Time: 191 msec     Systemic VTI:  0.19 m MV E velocity: 123.00 cm/s  Systemic Diam: 1.80 cm Maude Emmer MD Electronically signed by Maude Emmer MD Signature Date/Time: 07/28/2024/3:06:02 PM    Final    Overnight EEG with video Result Date: 07/28/2024 Maurine Jurist, MD     07/28/2024 12:51 PM Continuous Video-EEG Monitoring Report CPT/Type of Study: 95720 (EEG with video, 12-26 hours)      Indications for Procedure: Altered mental status Primary neurological diagnosis: G93.40; I61.9 Duration of study: 07/27/2024 23:45 to 07/28/2024 12:45 History: This is a 68 year old female presenting with acute altered mental status and left-sided weakness in the context of large intraparenchymal hematoma centered in the right parietal lobe. Continuous video-EEG monitoring was performed to evaluate for seizure. Technical Description: The EEG was performed using standard setting per the guidelines of American Clinical Neurophysiology Society (ACNS). A minimum of 21 electrodes were placed on scalp according to the International 10-20 or/and 10-10 Systems. Supplemental electrodes were placed as needed. Single EKG electrode was also used to detect cardiac arrhythmia. Patient's behavior was continuously recorded on video simultaneously with EEG. A minimum of 16 channels were used for data display. Each epoch of study was reviewed manually daily and as needed using standard referential and bipolar montages. Computerized quantitative EEG analysis (such as compressed spectral array analysis, trending, automated spike & seizure detection) were used as indicated. EEG Description: Epoch: 07/27/2024 23:45 to 07/28/2024 12:45 Background:  There was generalized polymorphic delta and theta slowing. The polymorphic delta slowing was most prominent and persistent in the right frontotemporal region. No discernible posterior dominant rhythm or sleep architecture was recorded. Diffuse beta and spindle-like activity was present, most likely due to medication effect (propofol , benzodiazepine). PERIODIC OR RHYTHMIC PATTERNS: None. EPILEPTIFORM DISCHARGES: None. SEIZURES: None. EVENTS: None. EKG: Atrial fibrillation was present. Impression: This is an abnormal EEG due to the presence of the following: 1) Generalized polymorphic delta and theta slowing, suggesting moderate to severe encephalopathy, which is nonspecific as to etiology (medication effect of propofol  and benzodiazepine could not be ruled out); 2) Persistent focal polymorphic delta slowing in the right frontotemporal region, suggesting focal cerebral dysfunction or/and structural lesion. No epileptiform discharge or seizure is present.      Assessment/Plan: Intracerebral hemorrhage: Does not have the patient appears brain-dead.  I will get a stat head CT to see if this is caused by hydrocephalus versus aggression of her hemorrhage/brain mass effect.  LOS: 3 days     Reyes JONETTA Budge 08/09/2024, 7:55 AM     Patient ID: Romero LULLA Masson, female   DOB: 1956/03/16, 68 y.o.   MRN: [id number]

## 2024-08-24 DEATH — deceased

## 2024-11-05 ENCOUNTER — Ambulatory Visit: Payer: No Typology Code available for payment source | Admitting: Nurse Practitioner

## 2025-01-21 ENCOUNTER — Ambulatory Visit: Admitting: Family Medicine
